# Patient Record
Sex: Male | Born: 1940 | Race: White | Hispanic: No | Marital: Married | Smoking: Never smoker
Health system: Southern US, Community
[De-identification: ages and names within clinical notes are randomized; demographics above are authoritative.]

## PROBLEM LIST (undated history)

## (undated) DIAGNOSIS — M1990 Unspecified osteoarthritis, unspecified site: Secondary | ICD-10-CM

## (undated) DIAGNOSIS — J189 Pneumonia, unspecified organism: Secondary | ICD-10-CM

## (undated) DIAGNOSIS — I499 Cardiac arrhythmia, unspecified: Secondary | ICD-10-CM

## (undated) DIAGNOSIS — K5730 Diverticulosis of large intestine without perforation or abscess without bleeding: Secondary | ICD-10-CM

## (undated) DIAGNOSIS — I10 Essential (primary) hypertension: Secondary | ICD-10-CM

## (undated) DIAGNOSIS — K648 Other hemorrhoids: Secondary | ICD-10-CM

## (undated) DIAGNOSIS — E785 Hyperlipidemia, unspecified: Secondary | ICD-10-CM

## (undated) DIAGNOSIS — I482 Chronic atrial fibrillation, unspecified: Secondary | ICD-10-CM

## (undated) DIAGNOSIS — Z86010 Personal history of colonic polyps: Secondary | ICD-10-CM

## (undated) HISTORY — DX: Diverticulosis of large intestine without perforation or abscess without bleeding: K57.30

## (undated) HISTORY — DX: Personal history of colonic polyps: Z86.010

## (undated) HISTORY — DX: Other hemorrhoids: K64.8

## (undated) HISTORY — PX: INGUINAL HERNIA REPAIR: SUR1180

## (undated) HISTORY — DX: Hyperlipidemia, unspecified: E78.5

## (undated) HISTORY — PX: COLONOSCOPY: SHX174

## (undated) HISTORY — PX: LIGAMENT REPAIR: SHX5444

## (undated) HISTORY — DX: Essential (primary) hypertension: I10

## (undated) HISTORY — PX: CATARACT EXTRACTION W/ INTRAOCULAR LENS  IMPLANT, BILATERAL: SHX1307

---
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
## 2002-01-17 DIAGNOSIS — Z860100 Personal history of colon polyps, unspecified: Secondary | ICD-10-CM

## 2002-01-17 HISTORY — DX: Personal history of colonic polyps: Z86.010

## 2002-01-17 HISTORY — DX: Personal history of colon polyps, unspecified: Z86.0100

## 2002-06-16 HISTORY — PX: KNEE ARTHROSCOPY: SHX127

## 2005-06-28 ENCOUNTER — Encounter: Admission: RE | Admit: 2005-06-28 | Discharge: 2005-06-28 | Payer: Self-pay | Admitting: Family Medicine

## 2007-02-01 ENCOUNTER — Ambulatory Visit: Payer: Self-pay | Admitting: Gastroenterology

## 2007-02-10 ENCOUNTER — Ambulatory Visit: Payer: Self-pay | Admitting: Gastroenterology

## 2011-04-24 ENCOUNTER — Encounter: Payer: Self-pay | Admitting: Gastroenterology

## 2011-05-15 ENCOUNTER — Encounter: Payer: Self-pay | Admitting: Gastroenterology

## 2011-05-15 ENCOUNTER — Ambulatory Visit (INDEPENDENT_AMBULATORY_CARE_PROVIDER_SITE_OTHER): Payer: Medicare Other | Admitting: Gastroenterology

## 2011-05-15 VITALS — BP 120/80 | HR 80 | Ht 71.0 in | Wt 202.2 lb

## 2011-05-15 DIAGNOSIS — K644 Residual hemorrhoidal skin tags: Secondary | ICD-10-CM

## 2011-05-15 DIAGNOSIS — Z86010 Personal history of colonic polyps: Secondary | ICD-10-CM

## 2011-05-15 DIAGNOSIS — K625 Hemorrhage of anus and rectum: Secondary | ICD-10-CM

## 2011-05-15 MED ORDER — HYDROCORTISONE ACE-PRAMOXINE 1-1 % RE CREA: TOPICAL_CREAM | Freq: Two times a day (BID) | RECTAL | Status: AC | PRN

## 2011-05-15 NOTE — Patient Instructions (Addendum)
Call back to schedule your pre visit and colonoscopy.  Your prescription(s) have been sent to you pharmacy.  When you have your labs done with your primary care MD please ask them to send Dr Jarold Motto a copy, fax number [phone]. Follow the High Fiber Diet below.   High Fiber Diet A high fiber diet changes your normal diet to include more whole grains, legumes, fruits, and vegetables. Changes in the diet involve replacing refined carbohydrates with unrefined foods. The calorie level of the diet is essentially unchanged. The Dietary Reference Intake (recommended amount) for adult males is 38 g per day. For adult females, it is 25 g per day. Pregnant and lactating women should consume 28 g of fiber per day. Fiber is the intact part of a plant that is not broken down during digestion. Functional fiber is fiber that has been isolated from the plant to provide a beneficial effect in the body. PURPOSE  Increase stool bulk.   Ease and regulate bowel movements.   Lower cholesterol.  INDICATIONS THAT YOU NEED MORE FIBER  Constipation and hemorrhoids.   Uncomplicated diverticulosis (intestine condition) and irritable bowel syndrome.   Weight management.   As a protective measure against hardening of the arteries (atherosclerosis), diabetes, and cancer.  NOTE OF CAUTION If you have a digestive or bowel problem, ask your caregiver for advice before adding high fiber foods to your diet. Some of the following medical problems are such that a high fiber diet should not be used without consulting your caregiver:  Acute diverticulitis (intestine infection).   Partial small bowel obstructions.   Complicated diverticular disease involving bleeding, rupture (perforation), or abscess (boil, furuncle).   Presence of autonomic neuropathy (nerve damage) or gastric paresis (stomach cannot empty itself).  GUIDELINES FOR INCREASING FIBER  Start adding fiber to the diet slowly. A gradual increase of about 5  more grams (2 slices of whole-wheat bread, 2 servings of most fruits or vegetables, or 1 bowl of high fiber cereal) per day is best. Too rapid an increase in fiber may result in constipation, flatulence, and bloating.   Drink enough water and fluids to keep your urine clear or pale yellow. Water, juice, or caffeine-free drinks are recommended. Not drinking enough fluid may cause constipation.   Eat a variety of high fiber foods rather than one type of fiber.   Try to increase your intake of fiber through using high fiber foods rather than fiber pills or supplements that contain small amounts of fiber.   The goal is to change the types of food eaten. Do not supplement your present diet with high fiber foods, but replace foods in your present diet.  INCLUDE A VARIETY OF FIBER SOURCES  Replace refined and processed grains with whole grains, canned fruits with fresh fruits, and incorporate other fiber sources. White rice, white breads, and most bakery goods contain little or no fiber.   Brown whole-grain rice, buckwheat oats, and many fruits and vegetables are all good sources of fiber. These include: broccoli, Brussels sprouts, cabbage, cauliflower, beets, sweet potatoes, white potatoes (skin on), carrots, tomatoes, eggplant, squash, berries, fresh fruits, and dried fruits.   Cereals appear to be the richest source of fiber. Cereal fiber is found in whole grains and bran. Bran is the fiber-rich outer coat of cereal grain, which is largely removed in refining. In whole-grain cereals, the bran remains. In breakfast cereals, the largest amount of fiber is found in those with "bran" in their names. The fiber content is  sometimes indicated on the label.   You may need to include additional fruits and vegetables each day.   In baking, for 1 cup white flour, you may use the following substitutions:   1 cup whole-wheat flour minus 2 tbs.    cup white flour plus  cup whole-wheat flour.  Document  Released: 06/02/2005 Document Revised: 02/12/2011 Document Reviewed: 04/10/2009 [facility] Patient Information 2012 [place], [place].

## 2011-05-15 NOTE — Progress Notes (Signed)
History of Present Illness:  This is a very healthy 70 year old Caucasian male without serious medical problems except for mild degenerative arthritis with when necessary Advil use. He does take aspirin 81 mg a day. He presents today with periodic bright red blood per rectum over the last 2 months without rectal pain, abdominal pain, diarrhea or constipation. He apparently has had normal CBC a metabolic profile at [facility]. He had a colon polyp removed in 2003 by colonoscopy with a negative followup in 2008 except for mixed hemorrhoids. Family history is noncontributory without any family members having had colon cancer or polyps. Patient's appetite is good and his weight is stable. He denies any systemic or hepatobiliary or upper GI complaints.    I have reviewed this patient's present history, medical and surgical past history, allergies and medications.     ROS: The remainder of the 10 point ROS is negative     Physical Exam: General well developed well nourished patient in no acute distress, appearing his stated age Eyes PERRLA, no icterus, fundoscopic exam per opthamologist Skin no lesions noted Neck supple, no adenopathy, no thyroid enlargement, no tenderness Chest clear to percussion and auscultation Heart no significant murmurs, gallops or rubs noted Abdomen no hepatosplenomegaly masses or tenderness, BS normal.  Rectal inspection normal no fissures, or fistulae noted.  No masses or tenderness on digital exam. Stool guaiac negative. There is a posterior bluish hemorrhoid present with stigmata of recent bleeding. Stool is guaiac negative. Extremities no acute joint lesions, edema, phlebitis or evidence of cellulitis. Neurologic patient oriented x 3, cranial nerves intact, no focal neurologic deficits noted. Psychological mental status normal and normal affect.  Assessment and plan: Probable hemorrhoidal bleeding in a 70 year old patient who is otherwise healthy.  However, he does have a past history of colon polyps, and is due for followup at this time. I prescribed when necessary Analpram cream, high fiber diet, and we will schedule colonoscopy at his convenience. I have asked him to have his primary care office  forward upcoming labs to our office for review.  No diagnosis found.

## 2011-05-16 ENCOUNTER — Encounter: Payer: Self-pay | Admitting: Gastroenterology

## 2011-05-22 ENCOUNTER — Ambulatory Visit (AMBULATORY_SURGERY_CENTER): Payer: Medicare Other | Admitting: *Deleted

## 2011-05-22 VITALS — Ht 71.0 in | Wt 202.3 lb

## 2011-05-22 DIAGNOSIS — K625 Hemorrhage of anus and rectum: Secondary | ICD-10-CM

## 2011-05-22 DIAGNOSIS — K644 Residual hemorrhoidal skin tags: Secondary | ICD-10-CM

## 2011-05-22 MED ORDER — PEG-KCL-NACL-NASULF-NA ASC-C 100 G PO SOLR: ORAL | Status: DC

## 2011-06-04 ENCOUNTER — Encounter: Payer: Self-pay | Admitting: Gastroenterology

## 2011-06-04 ENCOUNTER — Ambulatory Visit (AMBULATORY_SURGERY_CENTER): Payer: Medicare Other | Admitting: Gastroenterology

## 2011-06-04 DIAGNOSIS — K644 Residual hemorrhoidal skin tags: Secondary | ICD-10-CM

## 2011-06-04 DIAGNOSIS — K625 Hemorrhage of anus and rectum: Secondary | ICD-10-CM

## 2011-06-04 DIAGNOSIS — Z1211 Encounter for screening for malignant neoplasm of colon: Secondary | ICD-10-CM

## 2011-06-04 DIAGNOSIS — Z86010 Personal history of colonic polyps: Secondary | ICD-10-CM

## 2011-06-04 MED ORDER — SODIUM CHLORIDE 0.9 % IV SOLN: 500.0000 mL | INTRAVENOUS | Status: DC

## 2011-06-04 NOTE — Progress Notes (Signed)
Patient did not experience any of the following events: a burn prior to discharge; a fall within the facility; wrong site/side/patient/procedure/implant event; or a hospital transfer or hospital admission upon discharge from the facility. [phone])   Pt voiced no complaints noted in the recovery room. maw

## 2011-06-04 NOTE — Patient Instructions (Signed)
See the picture page for your findings from your exam today.  Follow the green and blue discharge instruction sheets the rest of the day.  Resume your prior medications today. Please call if any questions or concerns.  

## 2011-06-04 NOTE — Op Note (Signed)
[facility] [address] [place], [place]  [address]  COLONOSCOPY PROCEDURE REPORT  PATIENT:  Gregory [name], Gregory [name]  MR#:  [id number] BIRTHDATE:  10-04-1940, 70 yrs. old  GENDER:  male ENDOSCOPIST:  Vania Rea. Jarold Motto, MD, [facility] REF. BY: PROCEDURE DATE:  06/04/2011 PROCEDURE:  Diagnostic Colonoscopy ASA CLASS:  Class I INDICATIONS:  history of polyps MEDICATIONS:   propofol (Diprivan) 120 mg IV  DESCRIPTION OF PROCEDURE:   After the risks and benefits and of the procedure were explained, informed consent was obtained. Digital rectal exam was performed and revealed no abnormalities. The LB 180AL [id number] endoscope was introduced through the anus and advanced to the cecum, which was identified by both the appendix and ileocecal valve.  The quality of the prep was excellent, using MoviPrep.  The instrument was then slowly withdrawn as the colon was fully examined. <<PROCEDUREIMAGES>>  FINDINGS:  No polyps or cancers were seen.  This was otherwise a normal examination of the colon.   Retroflexed views in the rectum revealed no abnormalities.    The scope was then withdrawn from the patient and the procedure completed.  COMPLICATIONS:  None ENDOSCOPIC IMPRESSION: 1) No polyps or cancers 2) Otherwise normal examination RECOMMENDATIONS: 1) Continue current colorectal screening recommendations for "routine risk" patients with a repeat colonoscopy in 10 years.  REPEAT EXAM:  No  ______________________________ Vania Rea. Jarold Motto, MD, Clementeen Graham  CC:  Benedetto Goad, MD  n. Rosalie Doctor:   Vania Rea. Patterson at 06/04/2011 09:24 AM  Elenora Fender, [phone]

## 2011-06-05 ENCOUNTER — Telehealth: Payer: Self-pay

## 2011-06-05 NOTE — Telephone Encounter (Signed)

## 2012-03-31 ENCOUNTER — Encounter (HOSPITAL_COMMUNITY): Payer: Self-pay | Admitting: Family Medicine

## 2012-03-31 ENCOUNTER — Emergency Department (HOSPITAL_COMMUNITY): Payer: Medicare Other

## 2012-03-31 ENCOUNTER — Inpatient Hospital Stay (HOSPITAL_COMMUNITY)
Admission: EM | Admit: 2012-03-31 | Discharge: 2012-04-03 | DRG: 195 | Disposition: A | Discharge status: Home / Self Care | Payer: Medicare Other | Attending: Internal Medicine | Admitting: Internal Medicine

## 2012-03-31 DIAGNOSIS — Z8249 Family history of ischemic heart disease and other diseases of the circulatory system: Secondary | ICD-10-CM

## 2012-03-31 DIAGNOSIS — J189 Pneumonia, unspecified organism: Secondary | ICD-10-CM

## 2012-03-31 DIAGNOSIS — K625 Hemorrhage of anus and rectum: Secondary | ICD-10-CM

## 2012-03-31 DIAGNOSIS — I4891 Unspecified atrial fibrillation: Secondary | ICD-10-CM | POA: Diagnosis present

## 2012-03-31 DIAGNOSIS — N189 Chronic kidney disease, unspecified: Secondary | ICD-10-CM | POA: Diagnosis present

## 2012-03-31 DIAGNOSIS — I480 Paroxysmal atrial fibrillation: Secondary | ICD-10-CM | POA: Diagnosis present

## 2012-03-31 DIAGNOSIS — N289 Disorder of kidney and ureter, unspecified: Secondary | ICD-10-CM

## 2012-03-31 DIAGNOSIS — Z823 Family history of stroke: Secondary | ICD-10-CM

## 2012-03-31 DIAGNOSIS — I10 Essential (primary) hypertension: Secondary | ICD-10-CM | POA: Diagnosis present

## 2012-03-31 DIAGNOSIS — I129 Hypertensive chronic kidney disease with stage 1 through stage 4 chronic kidney disease, or unspecified chronic kidney disease: Secondary | ICD-10-CM | POA: Diagnosis present

## 2012-03-31 HISTORY — DX: Pneumonia, unspecified organism: J18.9

## 2012-03-31 LAB — COMPREHENSIVE METABOLIC PANEL
AST: 30 U/L (ref 0–37)
Albumin: 3.9 g/dL (ref 3.5–5.2)
Alkaline Phosphatase: 66 U/L (ref 39–117)
BUN: 22 mg/dL (ref 6–23)
GFR calc Af Amer: 46 mL/min — ABNORMAL LOW (ref >90)
GFR calc non Af Amer: 39 mL/min — ABNORMAL LOW (ref >90)
Potassium: 3.9 mEq/L (ref 3.5–5.1)
Sodium: 138 mEq/L (ref 135–145)
Total Protein: 7.6 g/dL (ref 6.0–8.3)

## 2012-03-31 LAB — CBC WITH DIFFERENTIAL/PLATELET
Basophils Relative: 0 % (ref 0–1)
HCT: 45 % (ref 39.0–52.0)
Hemoglobin: 16 g/dL (ref 13.0–17.0)
Lymphocytes Relative: 21 % (ref 12–46)
Monocytes Absolute: 0.5 10*3/uL (ref 0.1–1.0)
Monocytes Relative: 7 % (ref 3–12)
Neutrophils Relative %: 70 % (ref 43–77)
Platelets: 173 10*3/uL (ref 150–400)
WBC: 6.6 10*3/uL (ref 4.0–10.5)

## 2012-03-31 LAB — POCT I-STAT TROPONIN I

## 2012-03-31 MED ORDER — ALBUTEROL SULFATE (5 MG/ML) 0.5% IN NEBU
2.5000 mg | INHALATION_SOLUTION | Freq: Four times a day (QID) | RESPIRATORY_TRACT | Status: DC
  Administered 2012-03-31 – 2012-04-01 (×2): 2.5 mg via RESPIRATORY_TRACT
  Filled 2012-03-31 (×2): qty 0.5

## 2012-03-31 MED ORDER — METOPROLOL TARTRATE 1 MG/ML IV SOLN
5.0000 mg | INTRAVENOUS | Status: DC | PRN
  Administered 2012-03-31 – 2012-04-01 (×2): 5 mg via INTRAVENOUS
  Filled 2012-03-31 (×2): qty 5

## 2012-03-31 MED ORDER — HEPARIN SODIUM (PORCINE) 5000 UNIT/ML IJ SOLN
5000.0000 [IU] | Freq: Three times a day (TID) | INTRAMUSCULAR | Status: DC
  Administered 2012-03-31 – 2012-04-01 (×2): 5000 [IU] via SUBCUTANEOUS
  Filled 2012-03-31 (×5): qty 1

## 2012-03-31 MED ORDER — GUAIFENESIN ER 600 MG PO TB12
600.0000 mg | ORAL_TABLET | Freq: Two times a day (BID) | ORAL | Status: DC
  Administered 2012-03-31 – 2012-04-02 (×4): 600 mg via ORAL
  Filled 2012-03-31 (×5): qty 1

## 2012-03-31 MED ORDER — MENTHOL 3 MG MT LOZG
1.0000 | LOZENGE | OROMUCOSAL | Status: DC | PRN
Start: 2012-03-31 — End: 2012-04-03
  Filled 2012-03-31: qty 9

## 2012-03-31 MED ORDER — PIPERACILLIN-TAZOBACTAM 3.375 G IVPB
3.3750 g | Freq: Once | INTRAVENOUS | Status: AC
  Administered 2012-03-31: 3.375 g via INTRAVENOUS
  Filled 2012-03-31: qty 50

## 2012-03-31 MED ORDER — NAPROXEN 375 MG PO TABS
375.0000 mg | ORAL_TABLET | Freq: Three times a day (TID) | ORAL | Status: DC | PRN
  Administered 2012-03-31: 375 mg via ORAL
  Filled 2012-03-31: qty 1

## 2012-03-31 MED ORDER — SODIUM CHLORIDE 0.9 % IV BOLUS (SEPSIS)
1000.0000 mL | Freq: Once | INTRAVENOUS | Status: AC
  Administered 2012-03-31: 1000 mL via INTRAVENOUS

## 2012-03-31 MED ORDER — ENOXAPARIN SODIUM 100 MG/ML ~~LOC~~ SOLN
100.0000 mg | Freq: Two times a day (BID) | SUBCUTANEOUS | Status: DC
  Administered 2012-03-31 – 2012-04-01 (×2): 100 mg via SUBCUTANEOUS
  Filled 2012-03-31 (×3): qty 1

## 2012-03-31 MED ORDER — HYDROCOD POLST-CHLORPHEN POLST 10-8 MG/5ML PO LQCR
5.0000 mL | Freq: Once | ORAL | Status: AC
  Administered 2012-03-31: 5 mL via ORAL
  Filled 2012-03-31: qty 5

## 2012-03-31 MED ORDER — ASPIRIN 81 MG PO CHEW
81.0000 mg | CHEWABLE_TABLET | Freq: Every day | ORAL | Status: DC
  Administered 2012-03-31 – 2012-04-03 (×4): 81 mg via ORAL
  Filled 2012-03-31 (×4): qty 1

## 2012-03-31 MED ORDER — DILTIAZEM HCL 25 MG/5ML IV SOLN
10.0000 mg | Freq: Once | INTRAVENOUS | Status: AC
  Administered 2012-03-31: 10 mg via INTRAVENOUS
  Filled 2012-03-31: qty 5

## 2012-03-31 MED ORDER — HYDROCOD POLST-CHLORPHEN POLST 10-8 MG/5ML PO LQCR: 5.0000 mL | Freq: Two times a day (BID) | ORAL | Status: DC | PRN

## 2012-03-31 MED ORDER — GUAIFENESIN-CODEINE 100-10 MG/5ML PO SOLN
10.0000 mL | Freq: Four times a day (QID) | ORAL | Status: DC | PRN
  Administered 2012-04-01 – 2012-04-02 (×2): 10 mL via ORAL
  Filled 2012-03-31: qty 10
  Filled 2012-03-31: qty 5
  Filled 2012-03-31 (×2): qty 10

## 2012-03-31 MED ORDER — LEVOFLOXACIN IN D5W 750 MG/150ML IV SOLN: 750.0000 mg | INTRAVENOUS | Status: DC

## 2012-03-31 MED ORDER — VANCOMYCIN HCL IN DEXTROSE 1-5 GM/200ML-% IV SOLN
1000.0000 mg | Freq: Once | INTRAVENOUS | Status: DC
  Filled 2012-03-31: qty 200

## 2012-03-31 MED ORDER — DILTIAZEM HCL 50 MG/10ML IV SOLN
10.0000 mg | Freq: Once | INTRAVENOUS | Status: DC
  Filled 2012-03-31: qty 2

## 2012-03-31 MED ORDER — ACETAMINOPHEN 325 MG PO TABS
650.0000 mg | ORAL_TABLET | Freq: Four times a day (QID) | ORAL | Status: DC | PRN
  Administered 2012-03-31 – 2012-04-01 (×2): 650 mg via ORAL
  Filled 2012-03-31 (×2): qty 2

## 2012-03-31 MED ORDER — DILTIAZEM HCL ER COATED BEADS 120 MG PO CP24
120.0000 mg | ORAL_CAPSULE | Freq: Two times a day (BID) | ORAL | Status: DC
  Administered 2012-03-31 – 2012-04-02 (×4): 120 mg via ORAL
  Filled 2012-03-31 (×6): qty 1

## 2012-03-31 MED ORDER — LEVOFLOXACIN IN D5W 750 MG/150ML IV SOLN
750.0000 mg | INTRAVENOUS | Status: DC
  Administered 2012-03-31 – 2012-04-02 (×3): 750 mg via INTRAVENOUS
  Filled 2012-03-31 (×5): qty 150

## 2012-03-31 MED ORDER — SODIUM CHLORIDE 0.9 % IV SOLN
INTRAVENOUS | Status: DC
  Administered 2012-03-31 – 2012-04-01 (×3): via INTRAVENOUS

## 2012-03-31 NOTE — ED Notes (Signed)
Patient insists that he is supposed to see Dr. Sidonie Dickens upon arrival.  Patient did not want to be hooked up to the monitor and did not want to take off street clothes to get into gown.   Patient family at bedside.   Patient would not discuss symptoms or issues with RN.  Patient said "just call Dr. Sidonie Dickens he is waiting for me".   Patient advised that we do need to hook him up to heart monitor, and vital sign machinery.  Patient unhappy at this time.  Patient has conceded to get gown on at this time.  Patient given extra blankets per request.

## 2012-03-31 NOTE — ED Provider Notes (Signed)
History     CSN: [id number]  Arrival date & time 03/31/12  1406   First MD Initiated Contact with Patient 03/31/12 1505      Chief Complaint  Patient presents with  . Shortness of Breath  . Tachycardia    (Consider location/radiation/quality/duration/timing/severity/associated sxs/prior treatment) HPI Pt sent in from cardiologist office for pneumonia. Pt is being treated for pneumonia as outpatient with Levaquin for past few days. Worsening symptoms including cough productive of green sputum, fever, and SOB. Pt is also in new onset atrial fib. Denies palpitations, chest pain, lower ext swelling or pain.  Past Medical History  Diagnosis Date  . Personal history of colonic polyps 01/17/2002    adenomatous, tublar adenoma 02/10/2007  . Diverticulosis of colon (without mention of hemorrhage)   . Internal hemorrhoids without mention of complication   . High blood pressure     Past Surgical History  Procedure Date  . Inguinal hernia repair   . Knee arthroscopy     right  . Colonoscopy   . Cataract extraction     right    Family History  Problem Relation Age of Onset  . Heart disease Father   . Stroke Father   . Cancer Mother     bladder  . Colon cancer Neg Hx   . Stomach cancer Neg Hx     History  Substance Use Topics  . Smoking status: Never Smoker   . Smokeless tobacco: Never Used  . Alcohol Use: 8.4 oz/week    14 Glasses of wine per week      Review of Systems  Constitutional: Positive for fever, chills and fatigue.  HENT: Positive for congestion and rhinorrhea. Negative for neck pain.   Respiratory: Positive for cough and shortness of breath. Negative for wheezing.   Cardiovascular: Negative for chest pain, palpitations and leg swelling.  Gastrointestinal: Negative for nausea, vomiting, abdominal pain and diarrhea.  Musculoskeletal: Negative for back pain.  Skin: Negative for rash and wound.  Neurological: Positive for weakness. Negative for dizziness,  light-headedness, numbness and headaches.    Allergies  Review of patient's allergies indicates no known allergies.  Home Medications   Current Outpatient Rx  Name Route Sig Dispense Refill  . ASPIRIN 81 MG PO TABS Oral Take 81 mg by mouth daily.      . IBUPROFEN 200 MG PO TABS Oral Take 200 mg by mouth every 6 (six) hours as needed.      Marland Kitchen LEVOFLOXACIN 750 MG PO TABS Oral Take 750 mg by mouth daily. For 7 days    . ONE-DAILY MULTI VITAMINS PO TABS Oral Take 1 tablet by mouth daily.        BP 111/69  Pulse 88  Temp 97.8 F (36.6 C)  Resp 29  SpO2 96%  Physical Exam  Nursing note and vitals reviewed. Constitutional: He is oriented to person, place, and time. He appears well-developed and well-nourished. No distress.  HENT:  Head: Normocephalic and atraumatic.  Mouth/Throat: Oropharynx is clear and moist.  Eyes: EOM are normal. Pupils are equal, round, and reactive to light.  Neck: Normal range of motion. Neck supple.  Cardiovascular: Normal rate and regular rhythm.   Pulmonary/Chest: No respiratory distress. He has no wheezes. He has no rales.       Increased res effort. Speaking in short sentences. Decreased BS left base  Abdominal: Soft. Bowel sounds are normal. He exhibits no distension. There is no tenderness. There is no rebound and no guarding.  Musculoskeletal: Normal range of motion. He exhibits no edema and no tenderness.       No calf swelling or edema  Neurological: He is alert and oriented to person, place, and time.       Moves all ext without deficit  Skin: Skin is warm and dry. No rash noted. No erythema.  Psychiatric: He has a normal mood and affect. His behavior is normal.    ED Course  Procedures (including critical care time)  Labs Reviewed  CBC WITH DIFFERENTIAL - Abnormal; Notable for the following:    MCH 34.2 (*)     All other components within normal limits  COMPREHENSIVE METABOLIC PANEL - Abnormal; Notable for the following:    Glucose, Bld  116 (*)     Creatinine, Ser 1.69 (*)     GFR calc non Af Amer 39 (*)     GFR calc Af Amer 46 (*)     All other components within normal limits  POCT I-STAT TROPONIN I  LACTIC ACID, PLASMA  PROTIME-INR  APTT  CULTURE, BLOOD (ROUTINE X 2)  CULTURE, BLOOD (ROUTINE X 2)  URINALYSIS, ROUTINE W REFLEX MICROSCOPIC  URINE CULTURE   Dg Chest 2 View  03/31/2012  *RADIOLOGY REPORT*  Clinical Data: Shortness of breath  CHEST - 2 VIEW  Comparison: 03/31/2012  Findings: The cardiopericardial silhouette is enlarged. Interstitial markings are diffusely coarsened with chronic features.  Mild hyperexpansion raises a question of some underlying emphysema. The cardiopericardial silhouette is enlarged.  5 mm nodular opacity overlies the right lower lung.  Nodular density is also seen in the left costophrenic angle. Imaged bony structures of the thorax are intact.  IMPRESSION: Bilateral lower lobe nodular opacities. These appear new in the interval.  CT chest without contrast recommended to further evaluate.   Original Report Authenticated By: ERIC A. MANSELL, M.D.      1. Community acquired pneumonia   2. Atrial fibrillation with RVR      Date: 03/31/2012  Rate: 119  Rhythm: atrial fibrillation  QRS Axis: normal  Intervals: normal  ST/T Wave abnormalities: nonspecific T wave changes  Conduction Disutrbances:none  Narrative Interpretation:   Old EKG Reviewed: none available    MDM  HR improved with IVF and IV cardizem. Will admit to [facility]        Loren Racer, MD 03/31/12 1753

## 2012-03-31 NOTE — Consult Note (Signed)
CARDIOLOGY CONSULT NOTE  Patient ID: Gregory [name] MRN: [id number] DOB/AGE: 70-Nov-1942 70 y.o.  Admit date: 03/31/2012 Referring Physician  PCP Primary Physician:  Gregory Hoit, MD Reason for Consultation  atrial fibrillation with rapid ventricular response  HPI: Patient is a very active and fairly healthy 71 year old Caucasian male with no significant prior cardiac history, who had been doing well until about 5 days ago it started to feel feverish, not well, and today due to worsening symptoms and cough was seen by his PCP, and was started on antibiotics and was referred to me by dilation of atrial fibrillation with rapid ventricular response. After discussions with Dr. Marjory [name], I felt a 71 year old with high fever and A. fib with RVR needed to be in the hospital with diagnosis of pneumonia. The patient had acutely fallen more set by the time he came from his PCPs office to my office. Hence we recommended that he go to the emergency room. I did not see him in the office and I was in the hospital however per history from my office staff and also from his wife stating that he does not look well, I recommended that he come to the emergency room. I did see him in the emergency room and he was febrile with a temperature of 103F. Otherwise except for cough, feeling very poorly, he denies any palpitations, chest pain. He is been coughing and has not been productive. He has had a disturbed night last night. No PND or orthopnea. No history of TIA or strokes in the past. No prior history of atrial fibrillation the past.  Past Medical History  Diagnosis Date  . Personal history of colonic polyps 01/17/2002    adenomatous, tublar adenoma 02/10/2007  . Diverticulosis of colon (without mention of hemorrhage)   . Internal hemorrhoids without mention of complication   . High blood pressure      Past Surgical History  Procedure Date  . Inguinal hernia repair   . Knee arthroscopy     right  .  Colonoscopy   . Cataract extraction     right     Family History  Problem Relation Age of Onset  . Heart disease Father   . Stroke Father   . Cancer Mother     bladder  . Colon cancer Neg Hx   . Stomach cancer Neg Hx      Social History: History   Social History  . Marital Status: Married    Spouse Name: N/A    Number of Children: 2  . Years of Education: N/A   Occupational History  .  [employer]   Social History Main Topics  . Smoking status: Never Smoker   . Smokeless tobacco: Never Used  . Alcohol Use: 8.4 oz/week    14 Glasses of wine per week  . Drug Use: No  . Sexually Active: Not on file   Other Topics Concern  . Not on file   Social History Narrative  . No narrative on file     Prescriptions prior to admission  Medication Sig Dispense Refill  . aspirin 81 MG tablet Take 81 mg by mouth daily.        Marland Kitchen ibuprofen (ADVIL,MOTRIN) 200 MG tablet Take 200 mg by mouth every 6 (six) hours as needed.        Marland Kitchen levofloxacin (LEVAQUIN) 750 MG tablet Take 750 mg by mouth daily. For 7 days      . Multiple Vitamin (MULTIVITAMIN) tablet Take 1 tablet  by mouth daily.          Scheduled Meds:   . albuterol  2.5 mg Nebulization QID  . aspirin  81 mg Oral Daily  . chlorpheniramine-HYDROcodone  5 mL Oral Once  . diltiazem  120 mg Oral BID  . diltiazem  10 mg Intravenous Once  . guaiFENesin  600 mg Oral BID  . heparin  5,000 Units Subcutaneous Q8H  . levofloxacin (LEVAQUIN) IV  750 mg Intravenous Q24H  . piperacillin-tazobactam (ZOSYN)  IV  3.375 g Intravenous Once  . sodium chloride  1,000 mL Intravenous Once  . sodium chloride  1,000 mL Intravenous Once  . vancomycin  1,000 mg Intravenous Once  . DISCONTD: diltiazem  10 mg Intravenous Once  . DISCONTD: levofloxacin (LEVAQUIN) IV  750 mg Intravenous Q24H   Continuous Infusions:   . sodium chloride 100 mL/hr at 03/31/12 2053   PRN Meds:.acetaminophen, chlorpheniramine-HYDROcodone, guaiFENesin-codeine,  menthol-cetylpyridinium, metoprolol  ROS: General: Has had  fevers/chills/night sweats last 3 days. Eyes: no blurry vision, diplopia, or amaurosis ENT: no sore throat or hearing loss CV: no edema or palpitations GI: no abdominal pain, nausea, vomiting, diarrhea, or constipation GU: no dysuria, frequency, or hematuria Skin: no rash Neuro: no headache, numbness, tingling, or weakness of extremities Musculoskeletal: no joint pain or swelling Heme: no bleeding, DVT, or easy bruising Endo: no polydipsia or polyuria    Physical Exam: Blood pressure 123/83, pulse 118, temperature 103 F (39.4 C), temperature source Oral, resp. rate 20, height 5\' 11"  (1.803 m), weight 98.9 kg (218 lb 0.6 oz), SpO2 99.00%.   General appearance: alert, cooperative, appears stated age, no distress and toxic Lungs: Bilateral coarse basal crackles left worse than the right. Heart: irregularly irregular rhythm, S2: normal, no S3 or S4 and No murmur Abdomen: soft, non-tender; bowel sounds normal; no masses,  no organomegaly Extremities: extremities normal, atraumatic, no cyanosis or edema Pulses: 2+ and symmetric  Labs:   Lab Results  Component Value Date   WBC 6.6 03/31/2012   HGB 16.0 03/31/2012   HCT 45.0 03/31/2012   MCV 96.2 03/31/2012   PLT 173 03/31/2012    Lab 03/31/12 1418  NA 138  K 3.9  CL 99  CO2 24  BUN 22  CREATININE 1.69*  CALCIUM 9.4  PROT 7.6  BILITOT 0.6  ALKPHOS 66  ALT 41  AST 30  GLUCOSE 116*    Radiology: Dg Chest 2 View  03/31/2012  *RADIOLOGY REPORT*  Clinical Data: Shortness of breath  CHEST - 2 VIEW  Comparison: 03/31/2012  Findings: The cardiopericardial silhouette is enlarged. Interstitial markings are diffusely coarsened with chronic features.  Mild hyperexpansion raises a question of some underlying emphysema. The cardiopericardial silhouette is enlarged.  5 mm nodular opacity overlies the right lower lung.  Nodular density is also seen in the left costophrenic  angle. Imaged bony structures of the thorax are intact.  IMPRESSION: Bilateral lower lobe nodular opacities. These appear new in the interval.  CT chest without contrast recommended to further evaluate.   Original Report Authenticated By: Gregory [name], M.D.     EKG: Atrial fibrillation with rapid ventricular response with a heart rate of 112 beats a minute, cannot exclude inferior wall MI, old, baseline artifact, incomplete rectal branch block, nonspecific T-wave changes.  ASSESSMENT AND PLAN:  1. Bilateral pneumonia left worse in the right 2. New-onset of atrial fibrillation with rapid ventricular response Recommendation: Until we know the duration of atrial fibrillation and to see whether he  would return back to sinus rhythm, I have recommended that we start him back on Lovenox full dose until we figure out the further course of atrial fibrillation therapy including probable rectal cardio version in 4-6 weeks. I have also started him on Cardizem CD 120 mg by mouth twice a day. Also started him on codeine syrup for cough to help him sleep tonight. He still continues to have fever of 102F, I added Aleve. Continue to follow the patient along with the primary team. I do not think this is acute coronary syndrome. Cardiac markers have not been ordered.  [name], MD 03/31/2012, 9:35 PM

## 2012-03-31 NOTE — Progress Notes (Signed)
ANTICOAGULATION CONSULT NOTE - Initial Consult  Pharmacy Consult for Lovenox Indication: Atrial fibrillation  No Known Allergies  Patient Measurements: Height: 5\' 11"  (180.3 cm) Weight: 218 lb 0.6 oz (98.9 kg) IBW/kg (Calculated) : 75.3    Vital Signs: Temp: 102.6 F (39.2 C) (10/16 2130) Temp src: Oral (10/16 2130) BP: 118/66 mmHg (10/16 2130) Pulse Rate: 118  (10/16 1946)  Labs:  Basename 03/31/12 1556 03/31/12 1418  HGB -- 16.0  HCT -- 45.0  PLT -- 173  APTT 29 --  LABPROT 14.4 --  INR 1.14 --  HEPARINUNFRC -- --  CREATININE -- 1.69*  CKTOTAL -- --  CKMB -- --  TROPONINI -- --    Estimated Creatinine Clearance: 48.7 ml/min (by C-G formula based on Cr of 1.69).   Medical History: Past Medical History  Diagnosis Date  . Personal history of colonic polyps 01/17/2002    adenomatous, tublar adenoma 02/10/2007  . Diverticulosis of colon (without mention of hemorrhage)   . Internal hemorrhoids without mention of complication   . High blood pressure     Medications:  Scheduled:    . albuterol  2.5 mg Nebulization QID  . aspirin  81 mg Oral Daily  . chlorpheniramine-HYDROcodone  5 mL Oral Once  . diltiazem  120 mg Oral BID  . diltiazem  10 mg Intravenous Once  . enoxaparin (LOVENOX) injection  100 mg Subcutaneous BID  . guaiFENesin  600 mg Oral BID  . heparin  5,000 Units Subcutaneous Q8H  . levofloxacin (LEVAQUIN) IV  750 mg Intravenous Q24H  . piperacillin-tazobactam (ZOSYN)  IV  3.375 g Intravenous Once  . sodium chloride  1,000 mL Intravenous Once  . sodium chloride  1,000 mL Intravenous Once  . vancomycin  1,000 mg Intravenous Once  . DISCONTD: diltiazem  10 mg Intravenous Once  . DISCONTD: levofloxacin (LEVAQUIN) IV  750 mg Intravenous Q24H    Assessment: 71 yr old male was being seen by his PCP today for symptoms of pneumonia when an irregular heart beat occurred. The MD referred the pt to Dr. Jacinto Halim , who sent him to the ED where he was started on  antibiotics. Dr. Jacinto Halim wants him on full dose lovenox until the a.fib can be investigated further. Goal of Therapy:  Anti-Xa level 0.6-1.2 units/ml 4hrs after LMWH dose given Monitor platelets by anticoagulation protocol: Yes   Plan:  1) Lovenox 100 mg sq q12hrs (1mg /kg) 2) CBC every 3rd day to check platelet count.  Eugene Garnet 03/31/2012,10:16 PM

## 2012-03-31 NOTE — ED Notes (Signed)
Care transferred and report given to [place], [place].

## 2012-03-31 NOTE — H&P (Signed)
[facility] History and Physical  ALDOR CORBO ZOX:[id number] DOB: 05-07-1941 DOA: 03/31/2012  Referring physician: Loren Racer, MD PCP: Pamelia Hoit, MD  Specialists: Jacinto Halim  Chief Complaint: Shortness of breath  [name] is a 71 y.o. male with past medical history of diverticulosis and hypertension. Patient came to the hospital complaining about shortness of breath. Patient started started about 4 days ago with shortness of breath, cough and production of greenish sputum. Patient also had fever of 103 last night, they went to his primary care physician today and Levaquin was prescribed. Patient did not have any dose of antibiotic since his symptoms started, and because of his heart rate was irregular he was sent to Dr. Jacinto Halim. Dr. Jacinto Halim sent him to the hospital for further evaluation. Upon initial evaluation in the emergency department chest x-ray showed left lower lobe pneumonia and A. fib with RVR.   Review of Systems:  Constitutional: negative for anorexia, fevers and sweats Eyes: negative for irritation, redness and visual disturbance Ears, nose, mouth, throat, and face: negative for earaches, epistaxis, nasal congestion and sore throat Respiratory: SOB, cough and sputum production. Denies any wheezes Cardiovascular: Although he does have tachycardia but he denies palpitations, chest pain or lower extremity edema Gastrointestinal: negative for abdominal pain, constipation, diarrhea, melena, nausea and vomiting Genitourinary:negative for dysuria, frequency and hematuria Hematologic/lymphatic: negative for bleeding, easy bruising and lymphadenopathy Musculoskeletal:negative for arthralgias, muscle weakness and stiff joints Neurological: negative for coordination problems, gait problems, headaches and weakness Endocrine: negative for diabetic symptoms including polydipsia, polyuria and weight loss Allergic/Immunologic: negative for anaphylaxis, hay fever  and urticaria   Past Medical History  Diagnosis Date  . Personal history of colonic polyps 01/17/2002    adenomatous, tublar adenoma 02/10/2007  . Diverticulosis of colon (without mention of hemorrhage)   . Internal hemorrhoids without mention of complication   . High blood pressure    Past Surgical History  Procedure Date  . Inguinal hernia repair   . Knee arthroscopy     right  . Colonoscopy   . Cataract extraction     right   Social History:  reports that he has never smoked. He has never used smokeless tobacco. He reports that he drinks about 8.4 ounces of alcohol per week. He reports that he does not use illicit drugs.   No Known Allergies  Family History  Problem Relation Age of Onset  . Heart disease Father   . Stroke Father   . Cancer Mother     bladder  . Colon cancer Neg Hx   . Stomach cancer Neg Hx     Prior to Admission medications   Medication Sig Start Date End Date Taking? Authorizing Provider  aspirin 81 MG tablet Take 81 mg by mouth daily.     Yes Historical Provider, MD  ibuprofen (ADVIL,MOTRIN) 200 MG tablet Take 200 mg by mouth every 6 (six) hours as needed.     Yes Historical Provider, MD  levofloxacin (LEVAQUIN) 750 MG tablet Take 750 mg by mouth daily. For 7 days   Yes Historical Provider, MD  Multiple Vitamin (MULTIVITAMIN) tablet Take 1 tablet by mouth daily.     Yes Historical Provider, MD   Physical Exam: Filed Vitals:   03/31/12 1630 03/31/12 1645 03/31/12 1702 03/31/12 1745  BP: 123/83 128/75 113/70 111/69  Pulse: 96 61 130 88  Temp:      Resp: 32 31 33 29  SpO2: 94% 97% 97% 96%   General appearance: alert,  cooperative and no distress  Head: Normocephalic, without obvious abnormality, atraumatic  Eyes: conjunctivae/corneas clear. PERRL, EOM's intact. Fundi benign.  Nose: Nares normal. Septum midline. Mucosa normal. No drainage or sinus tenderness.  Throat: lips, mucosa, and tongue normal; teeth and gums normal  Neck: Supple, no masses,  no cervical lymphadenopathy, no JVD appreciated, no meningeal signs Resp: clear to auscultation bilaterally  Chest wall: no tenderness  Cardio: regular rate and rhythm, S1, S2 normal, no murmur, click, rub or gallop  GI: soft, non-tender; bowel sounds normal; no masses, no organomegaly  Extremities: extremities normal, atraumatic, no cyanosis or edema  Skin: Skin color, texture, turgor normal. No rashes or lesions  Neurologic: Alert and oriented X 3, normal strength and tone. Normal symmetric reflexes. Normal coordination and gait   Labs on Admission:  Basic Metabolic Panel:  Lab 03/31/12 4098  NA 138  K 3.9  CL 99  CO2 24  GLUCOSE 116*  BUN 22  CREATININE 1.69*  CALCIUM 9.4  MG --  PHOS --   Liver Function Tests:  Lab 03/31/12 1418  AST 30  ALT 41  ALKPHOS 66  BILITOT 0.6  PROT 7.6  ALBUMIN 3.9   No results found for this basename: LIPASE:5,AMYLASE:5 in the last 168 hours No results found for this basename: AMMONIA:5 in the last 168 hours CBC:  Lab 03/31/12 1418  WBC 6.6  NEUTROABS 4.7  HGB 16.0  HCT 45.0  MCV 96.2  PLT 173   Cardiac Enzymes: No results found for this basename: CKTOTAL:5,CKMB:5,CKMBINDEX:5,TROPONINI:5 in the last 168 hours  BNP (last 3 results) No results found for this basename: PROBNP:3 in the last 8760 hours CBG: No results found for this basename: GLUCAP:5 in the last 168 hours  Radiological Exams on Admission: Dg Chest 2 View  03/31/2012  *RADIOLOGY REPORT*  Clinical Data: Shortness of breath  CHEST - 2 VIEW  Comparison: 03/31/2012  Findings: The cardiopericardial silhouette is enlarged. Interstitial markings are diffusely coarsened with chronic features.  Mild hyperexpansion raises a question of some underlying emphysema. The cardiopericardial silhouette is enlarged.  5 mm nodular opacity overlies the right lower lung.  Nodular density is also seen in the left costophrenic angle. Imaged bony structures of the thorax are intact.   IMPRESSION: Bilateral lower lobe nodular opacities. These appear new in the interval.  CT chest without contrast recommended to further evaluate.   Original Report Authenticated By: ERIC A. MANSELL, M.D.     EKG: Independently reviewed.   Assessment/Plan Principal Problem:  *PNA (pneumonia) Active Problems:  Atrial fibrillation with RVR  Paroxysmal atrial fibrillation  Hypertension  Renal insufficiency   Pneumonia -LLL pneumonia, patient started on vancomycin and Zosyn in the ED. -I will hold on broad-spectrum antibiotics, treat as community-acquired pneumonia with Levaquin. -Supportive management with inhaled bronchodilators, mucolytics and oxygen as needed.  Atrial fibrillation with RVR -Patient reports that he has paroxysmal atrial fibrillation. -Protein the hospital with A. fib with RVR, he denies any symptoms. -It's probably driven by his pneumonia, I will check TSH and will rule out ACS. -This is expected to be controlled one shortness of breath/hypoxia controlled, meanwhile control with as needed IV metoprolol.  Renal insufficiency -Acute versus chronic, no baseline in our medical records. -Creatinine of 1.69, we'll hydrate with IV fluids, check BMP in a.m.  Code Status: Full  Family Communication: Wife at bedside  Disposition Plan: Telemetry  Time spent:70 minutes  [facility] A [facility] Pager [phone]  If 7PM-7AM, please contact night-coverage [email or web address] Password  [email or web address] 03/31/2012, 6:00 PM

## 2012-03-31 NOTE — ED Notes (Addendum)
Pt was at the doctor being treated for pneumonia and they noticed irregular heart beat. sts feeling bad for a few days. They gave him an abx shot. Pt shivering at triage. HR 130. Pt xray doe at [facility] and sent to Dr. Anselm Jungling

## 2012-04-01 LAB — URINALYSIS, ROUTINE W REFLEX MICROSCOPIC
Bilirubin Urine: NEGATIVE
Nitrite: NEGATIVE
Specific Gravity, Urine: 1.016 (ref 1.005–1.030)
pH: 6 (ref 5.0–8.0)

## 2012-04-01 LAB — EXPECTORATED SPUTUM ASSESSMENT W GRAM STAIN, RFLX TO RESP C

## 2012-04-01 LAB — COMPREHENSIVE METABOLIC PANEL
AST: 31 U/L (ref 0–37)
Albumin: 2.9 g/dL — ABNORMAL LOW (ref 3.5–5.2)
Alkaline Phosphatase: 47 U/L (ref 39–117)
Chloride: 106 mEq/L (ref 96–112)
Potassium: 3.9 mEq/L (ref 3.5–5.1)
Total Bilirubin: 0.5 mg/dL (ref 0.3–1.2)
Total Protein: 5.7 g/dL — ABNORMAL LOW (ref 6.0–8.3)

## 2012-04-01 LAB — CBC
HCT: 35 % — ABNORMAL LOW (ref 39.0–52.0)
MCH: 35.5 pg — ABNORMAL HIGH (ref 26.0–34.0)
MCV: 98.6 fL (ref 78.0–100.0)
Platelets: 141 10*3/uL — ABNORMAL LOW (ref 150–400)
RBC: 3.55 MIL/uL — ABNORMAL LOW (ref 4.22–5.81)
RDW: 13.5 % (ref 11.5–15.5)
WBC: 4.9 10*3/uL (ref 4.0–10.5)

## 2012-04-01 LAB — STREP PNEUMONIAE URINARY ANTIGEN: Strep Pneumo Urinary Antigen: NEGATIVE

## 2012-04-01 LAB — URINE MICROSCOPIC-ADD ON

## 2012-04-01 LAB — TSH: TSH: 0.925 u[IU]/mL (ref 0.350–4.500)

## 2012-04-01 MED ORDER — METOPROLOL TARTRATE 25 MG PO TABS
25.0000 mg | ORAL_TABLET | Freq: Three times a day (TID) | ORAL | Status: DC
  Administered 2012-04-01 – 2012-04-03 (×5): 25 mg via ORAL
  Filled 2012-04-01 (×7): qty 1

## 2012-04-01 MED ORDER — ALBUTEROL SULFATE (5 MG/ML) 0.5% IN NEBU: 2.5000 mg | INHALATION_SOLUTION | RESPIRATORY_TRACT | Status: DC | PRN

## 2012-04-01 MED ORDER — MAGNESIUM HYDROXIDE 400 MG/5ML PO SUSP
15.0000 mL | ORAL | Status: AC
  Administered 2012-04-01: 15 mL via ORAL
  Filled 2012-04-01: qty 30

## 2012-04-01 MED ORDER — ENOXAPARIN SODIUM 100 MG/ML ~~LOC~~ SOLN: 100.0000 mg | Freq: Two times a day (BID) | SUBCUTANEOUS | Status: DC

## 2012-04-01 MED ORDER — APIXABAN 5 MG PO TABS
5.0000 mg | ORAL_TABLET | Freq: Two times a day (BID) | ORAL | Status: DC
  Administered 2012-04-01 – 2012-04-03 (×4): 5 mg via ORAL
  Filled 2012-04-01 (×5): qty 1

## 2012-04-01 MED ORDER — ALBUTEROL SULFATE (5 MG/ML) 0.5% IN NEBU
2.5000 mg | INHALATION_SOLUTION | Freq: Two times a day (BID) | RESPIRATORY_TRACT | Status: DC
  Administered 2012-04-02 – 2012-04-03 (×3): 2.5 mg via RESPIRATORY_TRACT
  Filled 2012-04-01 (×3): qty 0.5

## 2012-04-01 NOTE — Care Management Note (Signed)
    Page 1 of 1   04/02/2012     12:00:08 PM   CARE MANAGEMENT NOTE 04/02/2012  Patient:  Gregory [name], Gregory [name]   Account Number:  [id number]  Date Initiated:  04/01/2012  Documentation initiated by:  Letha Cape  Subjective/Objective Assessment:   dx pna  admit- lives with spouse, pta independent.     Action/Plan:   Anticipated DC Date:  04/02/2012   Anticipated DC Plan:  HOME/SELF CARE      DC Planning Services  CM consult      Choice offered to / List presented to:             Status of service:  Completed, signed off Medicare Important Message given?   (If response is "NO", the following Medicare IM given date fields will be blank) Date Medicare IM given:   Date Additional Medicare IM given:    Discharge Disposition:  HOME/SELF CARE  Per UR Regulation:  Reviewed for med. necessity/level of care/duration of stay  If discussed at Long Length of Stay Meetings, dates discussed:    Comments:  04/02/12 11:59 Letha Cape RN, BSN [phone] patient is very independent, no needs anticipated.  04/01/12 11:06 Letha Cape RN, BSN [phone] patient lives with spouse, pta independent.  Patient has medication coverage with supplemental insurance, patient also has transportation at time of dc.  No needs anticipated.

## 2012-04-01 NOTE — Progress Notes (Signed)
[facility] PROGRESS NOTE  Gregory [name]:[id number] DOB: [birth year] DOA: 03/31/2012 PCP: Pamelia Hoit, MD  HPI/Subjective: Feels much better, denies any fever this morning but she still has cough   Assessment/Plan:  Pneumonia  -LLL pneumonia, patient started on vancomycin and Zosyn in the ED.  -Treat as community-acquired pneumonia with Levaquin.  -Supportive management with inhaled bronchodilators, mucolytics and oxygen as needed.   Atrial fibrillation with RVR  -Patient reports that he has paroxysmal atrial fibrillation.  -Protein the hospital with A. fib with RVR, he denies any symptoms.  -It's probably driven by his pneumonia, I will check TSH. -Cardiology on board, patient is on oral Cardizem, started on full dose of Lovenox. -We'll be careful with anticoagulation as patient has history of rectal bleeding before.  Renal insufficiency  -Acute versus chronic, no baseline in our medical records.  -Creatinine of 1.69, we'll hydrate with IV fluids, check BMP in a.m. improved to 1.5 today.   Code Status: Full Family Communication:  Disposition Plan: Inpatient    Consultants:  Ganji, Cardiology  Procedures:  None  Antibiotics:  Levaquin started 03/31/2012  Objective: Filed Vitals:   03/31/12 2240 04/01/12 0227 04/01/12 0409 04/01/12 0903  BP:   101/67   Pulse:   80   Temp: 100.1 F (37.8 C) 98.1 F (36.7 C) 98.3 F (36.8 C)   TempSrc: Oral Oral Oral   Resp:   20   Height:      Weight:      SpO2:   96% 96%    Intake/Output Summary (Last 24 hours) at 04/01/12 1241 Last data filed at 04/01/12 0900  Gross per 24 hour  Intake 686.67 ml  Output      0 ml  Net 686.67 ml   Filed Weights   03/31/12 1946  Weight: 98.9 kg (218 lb 0.6 oz)    Exam:  General: Alert and awake, oriented x3, not in any acute distress. HEENT: anicteric sclera, pupils reactive to light and accommodation, EOMI CVS: S1-S2 clear, no murmur rubs or  gallops Chest: ronchi esp. In LLL Abdomen: soft nontender, nondistended, normal bowel sounds, no organomegaly Extremities: no cyanosis, clubbing or edema noted bilaterally Neuro: Cranial nerves II-XII intact, no focal neurological deficits  Data Reviewed: Basic Metabolic Panel:  Lab 04/01/12 8119 03/31/12 1418  NA 137 138  K 3.9 3.9  CL 106 99  CO2 23 24  GLUCOSE 102* 116*  BUN 23 22  CREATININE 1.50* 1.69*  CALCIUM 8.3* 9.4  MG -- --  PHOS -- --   Liver Function Tests:  Lab 04/01/12 0455 03/31/12 1418  AST 31 30  ALT 34 41  ALKPHOS 47 66  BILITOT 0.5 0.6  PROT 5.7* 7.6  ALBUMIN 2.9* 3.9   No results found for this basename: LIPASE:5,AMYLASE:5 in the last 168 hours No results found for this basename: AMMONIA:5 in the last 168 hours CBC:  Lab 04/01/12 0455 03/31/12 1418  WBC 4.9 6.6  NEUTROABS -- 4.7  HGB 12.6* 16.0  HCT 35.0* 45.0  MCV 98.6 96.2  PLT 141* 173   Cardiac Enzymes: No results found for this basename: CKTOTAL:5,CKMB:5,CKMBINDEX:5,TROPONINI:5 in the last 168 hours BNP (last 3 results) No results found for this basename: PROBNP:3 in the last 8760 hours CBG: No results found for this basename: GLUCAP:5 in the last 168 hours  Recent Results (from the past 240 hour(s))  CULTURE, BLOOD (ROUTINE X 2)     Status: Normal (Preliminary result)   Collection Time  03/31/12  3:53 PM      Component Value Range Status Comment   Specimen Description BLOOD ARM RIGHT   Final    Special Requests BOTTLES DRAWN AEROBIC AND ANAEROBIC 10CC   Final    Culture  Setup Time 03/31/2012 22:42   Final    Culture     Final    Value:        BLOOD CULTURE RECEIVED NO GROWTH TO DATE CULTURE WILL BE HELD FOR 5 DAYS BEFORE ISSUING A FINAL NEGATIVE REPORT   Report Status PENDING   Incomplete   CULTURE, BLOOD (ROUTINE X 2)     Status: Normal (Preliminary result)   Collection Time   03/31/12  4:05 PM      Component Value Range Status Comment   Specimen Description BLOOD HAND  RIGHT   Final    Special Requests BOTTLES DRAWN AEROBIC ONLY 5CC   Final    Culture  Setup Time 03/31/2012 22:43   Final    Culture     Final    Value:        BLOOD CULTURE RECEIVED NO GROWTH TO DATE CULTURE WILL BE HELD FOR 5 DAYS BEFORE ISSUING A FINAL NEGATIVE REPORT   Report Status PENDING   Incomplete   CULTURE, EXPECTORATED SPUTUM-ASSESSMENT     Status: Normal   Collection Time   04/01/12  8:51 AM      Component Value Range Status Comment   Specimen Description SPUTUM   Final    Special Requests NONE   Final    Sputum evaluation     Final    Value: THIS SPECIMEN IS ACCEPTABLE. RESPIRATORY CULTURE REPORT TO FOLLOW.   Report Status 04/01/2012 FINAL   Final      Studies: Dg Chest 2 View  03/31/2012  *RADIOLOGY REPORT*  Clinical Data: Shortness of breath  CHEST - 2 VIEW  Comparison: 03/31/2012  Findings: The cardiopericardial silhouette is enlarged. Interstitial markings are diffusely coarsened with chronic features.  Mild hyperexpansion raises a question of some underlying emphysema. The cardiopericardial silhouette is enlarged.  5 mm nodular opacity overlies the right lower lung.  Nodular density is also seen in the left costophrenic angle. Imaged bony structures of the thorax are intact.  IMPRESSION: Bilateral lower lobe nodular opacities. These appear new in the interval.  CT chest without contrast recommended to further evaluate.   Original Report Authenticated By: ERIC A. MANSELL, M.D.     Scheduled Meds:   . albuterol  2.5 mg Nebulization BID  . aspirin  81 mg Oral Daily  . chlorpheniramine-HYDROcodone  5 mL Oral Once  . diltiazem  120 mg Oral BID  . diltiazem  10 mg Intravenous Once  . enoxaparin (LOVENOX) injection  100 mg Subcutaneous BID  . guaiFENesin  600 mg Oral BID  . heparin  5,000 Units Subcutaneous Q8H  . levofloxacin (LEVAQUIN) IV  750 mg Intravenous Q24H  . piperacillin-tazobactam (ZOSYN)  IV  3.375 g Intravenous Once  . sodium chloride  1,000 mL Intravenous  Once  . sodium chloride  1,000 mL Intravenous Once  . vancomycin  1,000 mg Intravenous Once  . DISCONTD: albuterol  2.5 mg Nebulization QID  . DISCONTD: diltiazem  10 mg Intravenous Once  . DISCONTD: levofloxacin (LEVAQUIN) IV  750 mg Intravenous Q24H   Continuous Infusions:   . sodium chloride 100 mL/hr at 04/01/12 1478    Principal Problem:  *PNA (pneumonia) Active Problems:  Atrial fibrillation with RVR  Paroxysmal atrial fibrillation  Hypertension  Renal insufficiency    Time spent: 35 minutes    [facility] A  [facility] Pager [phone]. If 8PM-8AM, please contact night-coverage at [email or web address], password [email or web address] 04/01/2012, 12:41 PM  LOS: 1 day

## 2012-04-01 NOTE — Progress Notes (Signed)
Subjective:  Patient feeling much better, still continues to have temperature spikes. Has started to cough more, has been nonproductive. Denies any palpitations or chest pain. Overall breathing much better.  Objective:  Vital Signs in the last 24 hours: Temp:  [98.1 F (36.7 C)-103 F (39.4 C)] 100.5 F (38.1 C) (10/17 1417) Pulse Rate:  [80-118] 98  (10/17 1417) Resp:  [20] 20  (10/17 1417) BP: (101-123)/(66-83) 120/75 mmHg (10/17 1417) SpO2:  [94 %-99 %] 94 % (10/17 1417) Weight:  [98.9 kg (218 lb 0.6 oz)] 98.9 kg (218 lb 0.6 oz) (10/16 1946)  Intake/Output from previous day: 10/16 0701 - 10/17 0700 In: 596.7 [I.V.:446.7; IV Piggyback:150] Out: -   Physical Exam:   General appearance: alert, cooperative, appears stated age and no distress Eyes: conjunctivae/corneas clear. PERRL, EOM's intact. Fundi benign. Neck: no adenopathy, no carotid bruit, no JVD, supple, symmetrical, trachea midline and thyroid not enlarged, symmetric, no tenderness/mass/nodules Neck: JVP - normal, carotids 2+= without bruits Resp: Bilateral left greater than 50%, right basal crackles and expiratory wheezes heard. Chest wall: no tenderness Cardio: irregularly irregular rhythm and no S3 or S4 GI: soft, non-tender; bowel sounds normal; no masses,  no organomegaly Extremities: extremities normal, atraumatic, no cyanosis or edema    Lab Results:  Basename 04/01/12 0455 03/31/12 1418  WBC 4.9 6.6  HGB 12.6* 16.0  PLT 141* 173    Basename 04/01/12 0455 03/31/12 1418  NA 137 138  K 3.9 3.9  CL 106 99  CO2 23 24  GLUCOSE 102* 116*  BUN 23 22  CREATININE 1.50* 1.69*     Basename 04/01/12 0455  PROT 5.7*  ALBUMIN 2.9*  AST 31  ALT 34  ALKPHOS 47  BILITOT 0.5  BILIDIR --  IBILI --     Assessment/Plan:  1. Atrial fibrillation with rapid response. Patient continues to have rapid ventricular response and heart rate slightly improved IV metoprolol. 2. Bilateral base  community-acquired  pneumonia  Recommendation: I will stop his Lovenox and switch him over to Eliquis 5 mg by mouth twice a day and also add metoprolol 25 mg by mouth 3 times a day. I will obtain an echocardiogram.   [name], M.D. 04/01/2012, 7:06 PM

## 2012-04-02 DIAGNOSIS — K625 Hemorrhage of anus and rectum: Secondary | ICD-10-CM

## 2012-04-02 LAB — LEGIONELLA ANTIGEN, URINE

## 2012-04-02 LAB — URINE CULTURE

## 2012-04-02 LAB — BASIC METABOLIC PANEL WITH GFR
BUN: 20 mg/dL (ref 6–23)
CO2: 21 meq/L (ref 19–32)
Calcium: 8.3 mg/dL — ABNORMAL LOW (ref 8.4–10.5)
Chloride: 109 meq/L (ref 96–112)
Creatinine, Ser: 1.38 mg/dL — ABNORMAL HIGH (ref 0.50–1.35)
GFR calc Af Amer: 58 mL/min — ABNORMAL LOW
GFR calc non Af Amer: 50 mL/min — ABNORMAL LOW
Glucose, Bld: 110 mg/dL — ABNORMAL HIGH (ref 70–99)
Potassium: 3.9 meq/L (ref 3.5–5.1)
Sodium: 138 meq/L (ref 135–145)

## 2012-04-02 LAB — CBC
HCT: 33.3 % — ABNORMAL LOW (ref 39.0–52.0)
Hemoglobin: 12.3 g/dL — ABNORMAL LOW (ref 13.0–17.0)
MCH: 36.8 pg — ABNORMAL HIGH (ref 26.0–34.0)
MCHC: 36.9 g/dL — ABNORMAL HIGH (ref 30.0–36.0)
MCV: 99.7 fL (ref 78.0–100.0)
Platelets: 161 K/uL (ref 150–400)
RBC: 3.34 MIL/uL — ABNORMAL LOW (ref 4.22–5.81)
RDW: 13.9 % (ref 11.5–15.5)
WBC: 4.2 K/uL (ref 4.0–10.5)

## 2012-04-02 MED ORDER — PANTOPRAZOLE SODIUM 40 MG PO TBEC
40.0000 mg | DELAYED_RELEASE_TABLET | Freq: Every day | ORAL | Status: DC
  Administered 2012-04-02 – 2012-04-03 (×2): 40 mg via ORAL
  Filled 2012-04-02 (×2): qty 1

## 2012-04-02 MED ORDER — DILTIAZEM HCL ER COATED BEADS 180 MG PO CP24
180.0000 mg | ORAL_CAPSULE | Freq: Two times a day (BID) | ORAL | Status: DC
  Administered 2012-04-02 – 2012-04-03 (×2): 180 mg via ORAL
  Filled 2012-04-02 (×3): qty 1

## 2012-04-02 MED ORDER — GUAIFENESIN ER 600 MG PO TB12
1200.0000 mg | ORAL_TABLET | Freq: Two times a day (BID) | ORAL | Status: DC
  Administered 2012-04-02 – 2012-04-03 (×2): 1200 mg via ORAL
  Filled 2012-04-02 (×4): qty 2

## 2012-04-02 MED ORDER — DIGOXIN 125 MCG PO TABS
0.1250 mg | ORAL_TABLET | Freq: Every day | ORAL | Status: DC
  Administered 2012-04-02 – 2012-04-03 (×2): 0.125 mg via ORAL
  Filled 2012-04-02 (×2): qty 1

## 2012-04-02 MED ORDER — ALUM & MAG HYDROXIDE-SIMETH 200-200-20 MG/5ML PO SUSP
15.0000 mL | Freq: Three times a day (TID) | ORAL | Status: DC | PRN
  Administered 2012-04-02: 15 mL via ORAL
  Filled 2012-04-02 (×2): qty 30

## 2012-04-02 MED ORDER — GUAIFENESIN ER 600 MG PO TB12
600.0000 mg | ORAL_TABLET | Freq: Once | ORAL | Status: AC
  Administered 2012-04-02: 600 mg via ORAL
  Filled 2012-04-02: qty 1

## 2012-04-02 NOTE — Progress Notes (Signed)
Subjective:  Patient feeling much better, still continues to have mild temperature spikes (100.3 F).  Denies any palpitations or chest pain. Overall breathing much better.  Objective:  Vital Signs in the last 24 hours: Temp:  [98.4 F (36.9 C)-99.6 F (37.6 C)] 98.4 F (36.9 C) (10/18 1438) Pulse Rate:  [80-132] 87  (10/18 2040) Resp:  [18-20] 18  (10/18 2040) BP: (105-138)/(64-80) 105/64 mmHg (10/18 1515) SpO2:  [94 %-98 %] 96 % (10/18 1438)  Intake/Output from previous day: 10/17 0701 - 10/18 0700 In: 2186.7 [P.O.:480; I.V.:1706.7] Out: -   Physical Exam:   General appearance: alert, cooperative, appears stated age and no distress Eyes: conjunctivae/corneas clear. PERRL, EOM's intact. Fundi benign. Neck: no adenopathy, no carotid bruit, no JVD, supple, symmetrical, trachea midline and thyroid not enlarged, symmetric, no tenderness/mass/nodules Neck: JVP - normal, carotids 2+= without bruits Resp: Scattered crackles left base, much improved from yesterday Chest wall: no tenderness Cardio: irregularly irregular rhythm and no S3 or S4 GI: soft, non-tender; bowel sounds normal; no masses,  no organomegaly Extremities: extremities normal, atraumatic, no cyanosis or edema    Lab Results:  Basename 04/02/12 0521 04/01/12 0455  WBC 4.2 4.9  HGB 12.3* 12.6*  PLT 161 141*    Basename 04/02/12 0521 04/01/12 0455  NA 138 137  K 3.9 3.9  CL 109 106  CO2 21 23  GLUCOSE 110* 102*  BUN 20 23  CREATININE 1.38* 1.50*     Basename 04/01/12 0455  PROT 5.7*  ALBUMIN 2.9*  AST 31  ALT 34  ALKPHOS 47  BILITOT 0.5  BILIDIR --  IBILI --   Echo 04/02/12: Hyperdynamic LVEF 80%. A. FIb with RVR. 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  Assessment/Plan:  1. Atrial fibrillation with rapid response. Patient continues to have rapid ventricular response 2. Bilateral base  community-acquired pneumonia  Recommendation: Increased Cardizem CD to 180 mg BID and  added low dose digoxin. He may be able to go home in the morning if stable and he is very anxious to go home and states he will f/u with me as OP and follow the instructions closely. If stable home tomorrow otherwise in hte next 24 to 48 hours. I am OK to have HR in 110 range due to his fever and pneumonia. Suspect A. Fibrillation is chronic given LA enlargement.   [name], M.D. 04/02/2012, 8:46 PM

## 2012-04-02 NOTE — Progress Notes (Signed)
[facility] PROGRESS NOTE  Gregory [name] ZOX:[id number] DOB: 1941-01-07 DOA: 03/31/2012 PCP: Pamelia Hoit, MD  HPI/Subjective: Feels better but he still has a low-grade fever of 99.4. Patient is eager to go home   Assessment/Plan:  Pneumonia  -Bilateral pneumonia worse on the LLL, patient started on vancomycin and Zosyn in the ED.  -Treat as community-acquired pneumonia with Levaquin.  -Supportive management with inhaled bronchodilators, mucolytics and oxygen as needed.  -Improved, but still has a low-grade fever. Leukocytosis resolved  Atrial fibrillation with RVR  -Patient reports that he has paroxysmal atrial fibrillation.  -Protein the hospital with A. fib with RVR, he denies any symptoms.  -It's probably driven by his pneumonia, TSH is normal -Cardiology on board, patient is on oral Cardizem and metoprolol. -Started on full dose of Lovenox, then switched to Eliquis.  Renal insufficiency  -Acute versus chronic, no baseline in our medical records.  -Creatinine of 1.69, we'll hydrate with IV fluids, check BMP in a.m. improved to 1.3 today.   Code Status: Full Family Communication:  Disposition Plan: Wants to go home, he can be likely discharged on the weekend to either Saturday or Sunday if it's okay with that Dr. Jacinto Halim   Consultants:  Jacinto Halim, Cardiology  Procedures:  None  Antibiotics:  Levaquin started 03/31/2012  Objective: Filed Vitals:   04/01/12 2332 04/02/12 0600 04/02/12 0827 04/02/12 1002  BP:  134/80  132/80  Pulse:  99  80  Temp: 99.6 F (37.6 C) 99.4 F (37.4 C)    TempSrc:  Oral    Resp:  20    Height:      Weight:      SpO2:  94% 98%     Intake/Output Summary (Last 24 hours) at 04/02/12 1252 Last data filed at 04/01/12 1825  Gross per 24 hour  Intake 1946.67 ml  Output      0 ml  Net 1946.67 ml   Filed Weights   03/31/12 1946  Weight: 98.9 kg (218 lb 0.6 oz)    Exam:  General: Alert and awake, oriented x3, not  in any acute distress. HEENT: anicteric sclera, pupils reactive to light and accommodation, EOMI CVS: S1-S2 clear, no murmur rubs or gallops Chest: ronchi esp. In LLL Abdomen: soft nontender, nondistended, normal bowel sounds, no organomegaly Extremities: no cyanosis, clubbing or edema noted bilaterally Neuro: Cranial nerves II-XII intact, no focal neurological deficits  Data Reviewed: Basic Metabolic Panel:  Lab 04/02/12 8119 04/01/12 0455 03/31/12 1418  NA 138 137 138  K 3.9 3.9 3.9  CL 109 106 99  CO2 21 23 24   GLUCOSE 110* 102* 116*  BUN 20 23 22   CREATININE 1.38* 1.50* 1.69*  CALCIUM 8.3* 8.3* 9.4  MG -- -- --  PHOS -- -- --   Liver Function Tests:  Lab 04/01/12 0455 03/31/12 1418  AST 31 30  ALT 34 41  ALKPHOS 47 66  BILITOT 0.5 0.6  PROT 5.7* 7.6  ALBUMIN 2.9* 3.9   No results found for this basename: LIPASE:5,AMYLASE:5 in the last 168 hours No results found for this basename: AMMONIA:5 in the last 168 hours CBC:  Lab 04/02/12 0521 04/01/12 0455 03/31/12 1418  WBC 4.2 4.9 6.6  NEUTROABS -- -- 4.7  HGB 12.3* 12.6* 16.0  HCT 33.3* 35.0* 45.0  MCV 99.7 98.6 96.2  PLT 161 141* 173   Cardiac Enzymes: No results found for this basename: CKTOTAL:5,CKMB:5,CKMBINDEX:5,TROPONINI:5 in the last 168 hours BNP (last 3 results) No results found for this  basename: PROBNP:3 in the last 8760 hours CBG: No results found for this basename: GLUCAP:5 in the last 168 hours  Recent Results (from the past 240 hour(s))  CULTURE, BLOOD (ROUTINE X 2)     Status: Normal (Preliminary result)   Collection Time   03/31/12  3:53 PM      Component Value Range Status Comment   Specimen Description BLOOD ARM RIGHT   Final    Special Requests BOTTLES DRAWN AEROBIC AND ANAEROBIC 10CC   Final    Culture  Setup Time 03/31/2012 22:42   Final    Culture     Final    Value:        BLOOD CULTURE RECEIVED NO GROWTH TO DATE CULTURE WILL BE HELD FOR 5 DAYS BEFORE ISSUING A FINAL NEGATIVE REPORT     Report Status PENDING   Incomplete   CULTURE, BLOOD (ROUTINE X 2)     Status: Normal (Preliminary result)   Collection Time   03/31/12  4:05 PM      Component Value Range Status Comment   Specimen Description BLOOD HAND RIGHT   Final    Special Requests BOTTLES DRAWN AEROBIC ONLY 5CC   Final    Culture  Setup Time 03/31/2012 22:43   Final    Culture     Final    Value:        BLOOD CULTURE RECEIVED NO GROWTH TO DATE CULTURE WILL BE HELD FOR 5 DAYS BEFORE ISSUING A FINAL NEGATIVE REPORT   Report Status PENDING   Incomplete   CULTURE, EXPECTORATED SPUTUM-ASSESSMENT     Status: Normal   Collection Time   04/01/12  8:51 AM      Component Value Range Status Comment   Specimen Description SPUTUM   Final    Special Requests NONE   Final    Sputum evaluation     Final    Value: THIS SPECIMEN IS ACCEPTABLE. RESPIRATORY CULTURE REPORT TO FOLLOW.   Report Status 04/01/2012 FINAL   Final   CULTURE, RESPIRATORY     Status: Normal (Preliminary result)   Collection Time   04/01/12  8:51 AM      Component Value Range Status Comment   Specimen Description SPUTUM   Final    Special Requests NONE   Final    Gram Stain     Final    Value: ABUNDANT WBC PRESENT, PREDOMINANTLY PMN     FEW SQUAMOUS EPITHELIAL CELLS PRESENT     RARE GRAM POSITIVE COCCI IN PAIRS   Culture Culture reincubated for better growth   Final    Report Status PENDING   Incomplete      Studies: Dg Chest 2 View  03/31/2012  *RADIOLOGY REPORT*  Clinical Data: Shortness of breath  CHEST - 2 VIEW  Comparison: 03/31/2012  Findings: The cardiopericardial silhouette is enlarged. Interstitial markings are diffusely coarsened with chronic features.  Mild hyperexpansion raises a question of some underlying emphysema. The cardiopericardial silhouette is enlarged.  5 mm nodular opacity overlies the right lower lung.  Nodular density is also seen in the left costophrenic angle. Imaged bony structures of the thorax are intact.  IMPRESSION:  Bilateral lower lobe nodular opacities. These appear new in the interval.  CT chest without contrast recommended to further evaluate.   Original Report Authenticated By: ERIC A. MANSELL, M.D.     Scheduled Meds:    . albuterol  2.5 mg Nebulization BID  . apixaban  5 mg Oral BID  .  aspirin  81 mg Oral Daily  . diltiazem  120 mg Oral BID  . guaiFENesin  600 mg Oral BID  . levofloxacin (LEVAQUIN) IV  750 mg Intravenous Q24H  . magnesium hydroxide  15 mL Oral NOW  . metoprolol tartrate  25 mg Oral TID  . vancomycin  1,000 mg Intravenous Once  . DISCONTD: enoxaparin (LOVENOX) injection  100 mg Subcutaneous BID  . DISCONTD: enoxaparin (LOVENOX) injection  100 mg Subcutaneous BID   Continuous Infusions:    . DISCONTD: sodium chloride 100 mL/hr at 04/01/12 1646    Principal Problem:  *PNA (pneumonia) Active Problems:  Atrial fibrillation with RVR  Paroxysmal atrial fibrillation  Hypertension  Renal insufficiency    Time spent: 35 minutes    [facility] A  [facility] Pager [phone]. If 8PM-8AM, please contact night-coverage at [email or web address], password [facility] 04/02/2012, 12:52 PM  LOS: 2 days

## 2012-04-02 NOTE — Progress Notes (Signed)
levaquin was not available at scheduled time, Med just recieved from pharmacy

## 2012-04-02 NOTE — Progress Notes (Signed)
Echocardiogram 2D Echocardiogram has been performed.  Gregory [name] 04/02/2012, 9:34 AM

## 2012-04-03 LAB — BASIC METABOLIC PANEL
Calcium: 8.8 mg/dL (ref 8.4–10.5)
Creatinine, Ser: 1.47 mg/dL — ABNORMAL HIGH (ref 0.50–1.35)
GFR calc Af Amer: 54 mL/min — ABNORMAL LOW (ref >90)
GFR calc non Af Amer: 47 mL/min — ABNORMAL LOW (ref >90)
Sodium: 141 mEq/L (ref 135–145)

## 2012-04-03 LAB — CULTURE, RESPIRATORY W GRAM STAIN

## 2012-04-03 MED ORDER — APIXABAN 5 MG PO TABS: 5.0000 mg | ORAL_TABLET | Freq: Two times a day (BID) | ORAL | Status: DC

## 2012-04-03 MED ORDER — METOPROLOL TARTRATE 25 MG PO TABS: 25.0000 mg | ORAL_TABLET | Freq: Two times a day (BID) | ORAL | Status: DC

## 2012-04-03 MED ORDER — LEVOFLOXACIN 750 MG PO TABS
750.0000 mg | ORAL_TABLET | Freq: Every day | ORAL | Status: DC
  Filled 2012-04-03: qty 1

## 2012-04-03 MED ORDER — DILTIAZEM HCL ER COATED BEADS 180 MG PO CP24: 180.0000 mg | ORAL_CAPSULE | Freq: Two times a day (BID) | ORAL | Status: DC

## 2012-04-03 MED ORDER — NITROGLYCERIN 0.4 MG SL SUBL
SUBLINGUAL_TABLET | SUBLINGUAL | Status: AC
  Filled 2012-04-03: qty 25

## 2012-04-03 MED ORDER — LEVOFLOXACIN 750 MG PO TABS: 750.0000 mg | ORAL_TABLET | Freq: Every day | ORAL | Status: DC

## 2012-04-03 NOTE — Discharge Summary (Signed)
Physician Discharge Summary  Gregory [name] AOZ:[id number] DOB: 11-17-1940 DOA: 03/31/2012  PCP: Pamelia Hoit, MD  Admit date: 03/31/2012 Discharge date: 04/03/2012  Recommendations for Outpatient Follow-up:  1. Followup with primary care physician Dr. Jacinto Halim.  Discharge Diagnoses:  Principal Problem:  *PNA (pneumonia) Active Problems:  Atrial fibrillation with RVR  Paroxysmal atrial fibrillation  Hypertension  Renal insufficiency   Discharge Condition: Stable  Diet recommendation: Heart healthy diet  Filed Weights   03/31/12 1946  Weight: 98.9 kg (218 lb 0.6 oz)    History of present illness:  Gregory [name] is a 71 y.o. male with past medical history of diverticulosis and hypertension. Patient came to the hospital complaining about shortness of breath. Patient started started about 4 days ago with shortness of breath, cough and production of greenish sputum. Patient also had fever of 103 last night, they went to his primary care physician today and Levaquin was prescribed. Patient did not have any dose of antibiotic since his symptoms started, and because of his heart rate was irregular he was sent to Dr. Jacinto Halim. Dr. Jacinto Halim sent him to the hospital for further evaluation. Upon initial evaluation in the emergency department chest x-ray showed left lower lobe pneumonia and A. fib with RVR.  Hospital Course:   1. Pneumonia: Community-acquired pneumonia, bilateral with worse infiltrates in the LLL. Patient was started on vancomycin and Zosyn in the emergency department, had one dose and then treated as community-acquired pneumonia with IV Levaquin. The time of discharge patient will have Levaquin for 7 more days to complete 10 days of the antibiotics because of the severity of the pneumonia. Patient also instructed to take Mucinex, patient said he will get all OTC Mucinex. His sputum cultures is pending at the time of discharge but showed GPCs in pairs, (likely  Streptococcus).  2. Atrial fibrillation with RVR: Patient reported that he had paroxysmal atrial fibrillation, he was taken low-dose aspirin for that. Upon admission to the hospital he did have atrial fibrillation with rapid ventricular response, likely triggered by the pneumonia. His TSH is normal. Dr. Jacinto Halim was consulted and he recommended Cardizem and metoprolol. Patient heart rate is more controlled, but was still some spikes in the 120s to 130s. So low-dose of digoxin was added. At the day of discharge patient heart rate was in the 80s, I did discharge him on Cardizem and metoprolol, and not on digoxin because of his renal insufficiency. He was started on Eliquis for primary stroke prevention, he is also going to be on low-dose aspirin. Patient to be evaluated by Dr. Jacinto Halim. next week for further adjustment of the medications  3. Renal sufficiency: Acute versus chronic, there is no baseline in our medical records. Presented with creatinine of 1.6, creatinine baseline is 1.47. His BUN/creatine creatinine ratio does not suggest dehydration or ARF. This is likely chronic kidney disease, patient should be followed closely.   Procedures:  2-D echocardiogram showed   - Left ventricle: The cavity size was normal. Systolic function was hyperdynamic. The estimated ejection fractionwas in the range of 75% to 85%. Wall motion was normal; there were no regional wall motion abnormalities. Thestudy is not technically sufficient to allow evaluation of LV diastolic function. - Left atrium: The atrium was severely dilated. - Pulmonary arteries: PA peak pressure: 42mm Hg (S).   Consultations:  [name], [place] cardiology  Discharge Exam: Filed Vitals:   04/02/12 2040 04/02/12 2214 04/03/12 0502 04/03/12 1001  BP:  129/85 130/78 117/70  Pulse: 87 84  86 60  Temp:  98.5 F (36.9 C) 99.1 F (37.3 C)   TempSrc:  Oral Oral   Resp: 18 20 18    Height:      Weight:      SpO2:  99% 92%    General: Alert  and awake, oriented x3, not in any acute distress. HEENT: anicteric sclera, pupils reactive to light and accommodation, EOMI CVS: S1-S2 clear, no murmur rubs or gallops Chest: There is still rhonchi bilaterally worse in the left side Abdomen: soft nontender, nondistended, normal bowel sounds, no organomegaly Extremities: no cyanosis, clubbing or edema noted bilaterally Neuro: Cranial nerves II-XII intact, no focal neurological deficits  Discharge Instructions  Discharge Orders    Future Orders Please Complete By Expires   Diet - low sodium heart healthy      Increase activity slowly          Medication List     As of 04/03/2012 10:55 AM    STOP taking these medications         ibuprofen 200 MG tablet   Commonly known as: ADVIL,MOTRIN      TAKE these medications         apixaban 5 MG Tabs tablet   Commonly known as: ELIQUIS   Take 1 tablet (5 mg total) by mouth 2 (two) times daily.      aspirin 81 MG tablet   Take 81 mg by mouth daily.      diltiazem 180 MG 24 hr capsule   Commonly known as: CARDIZEM CD   Take 1 capsule (180 mg total) by mouth 2 (two) times daily.      levofloxacin 750 MG tablet   Commonly known as: LEVAQUIN   Take 1 tablet (750 mg total) by mouth daily. For 7 days      metoprolol tartrate 25 MG tablet   Commonly known as: LOPRESSOR   Take 1 tablet (25 mg total) by mouth 2 (two) times daily.      multivitamin tablet   Take 1 tablet by mouth daily.           Follow-up Information    Follow up with [name], MD. In 1 week.   Contact information:   [address]  [place] [address] [phone]       Follow up with Pamelia Hoit, MD. In 1 week.   Contact information:   [address] [place] [address] [phone]           The results of significant diagnostics from this hospitalization (including imaging, microbiology, ancillary and laboratory) are listed below for reference.    Significant  Diagnostic Studies: Dg Chest 2 View  03/31/2012  *RADIOLOGY REPORT*  Clinical Data: Shortness of breath  CHEST - 2 VIEW  Comparison: 03/31/2012  Findings: The cardiopericardial silhouette is enlarged. Interstitial markings are diffusely coarsened with chronic features.  Mild hyperexpansion raises a question of some underlying emphysema. The cardiopericardial silhouette is enlarged.  5 mm nodular opacity overlies the right lower lung.  Nodular density is also seen in the left costophrenic angle. Imaged bony structures of the thorax are intact.  IMPRESSION: Bilateral lower lobe nodular opacities. These appear new in the interval.  CT chest without contrast recommended to further evaluate.   Original Report Authenticated By: ERIC A. MANSELL, M.D.     Microbiology: Recent Results (from the past 240 hour(s))  CULTURE, BLOOD (ROUTINE X 2)     Status: Normal (Preliminary result)  Collection Time   03/31/12  3:53 PM      Component Value Range Status Comment   Specimen Description BLOOD ARM RIGHT   Final    Special Requests BOTTLES DRAWN AEROBIC AND ANAEROBIC 10CC   Final    Culture  Setup Time 03/31/2012 22:42   Final    Culture     Final    Value:        BLOOD CULTURE RECEIVED NO GROWTH TO DATE CULTURE WILL BE HELD FOR 5 DAYS BEFORE ISSUING A FINAL NEGATIVE REPORT   Report Status PENDING   Incomplete   CULTURE, BLOOD (ROUTINE X 2)     Status: Normal (Preliminary result)   Collection Time   03/31/12  4:05 PM      Component Value Range Status Comment   Specimen Description BLOOD HAND RIGHT   Final    Special Requests BOTTLES DRAWN AEROBIC ONLY 5CC   Final    Culture  Setup Time 03/31/2012 22:43   Final    Culture     Final    Value:        BLOOD CULTURE RECEIVED NO GROWTH TO DATE CULTURE WILL BE HELD FOR 5 DAYS BEFORE ISSUING A FINAL NEGATIVE REPORT   Report Status PENDING   Incomplete   URINE CULTURE     Status: Normal   Collection Time   04/01/12  6:34 AM      Component Value Range Status  Comment   Specimen Description URINE, RANDOM   Final    Special Requests NONE   Final    Culture  Setup Time 04/01/2012 15:44   Final    Colony Count NO GROWTH   Final    Culture NO GROWTH   Final    Report Status 04/02/2012 FINAL   Final   CULTURE, EXPECTORATED SPUTUM-ASSESSMENT     Status: Normal   Collection Time   04/01/12  8:51 AM      Component Value Range Status Comment   Specimen Description SPUTUM   Final    Special Requests NONE   Final    Sputum evaluation     Final    Value: THIS SPECIMEN IS ACCEPTABLE. RESPIRATORY CULTURE REPORT TO FOLLOW.   Report Status 04/01/2012 FINAL   Final   CULTURE, RESPIRATORY     Status: Normal (Preliminary result)   Collection Time   04/01/12  8:51 AM      Component Value Range Status Comment   Specimen Description SPUTUM   Final    Special Requests NONE   Final    Gram Stain     Final    Value: ABUNDANT WBC PRESENT, PREDOMINANTLY PMN     FEW SQUAMOUS EPITHELIAL CELLS PRESENT     RARE GRAM POSITIVE COCCI IN PAIRS   Culture Culture reincubated for better growth   Final    Report Status PENDING   Incomplete      Labs: Basic Metabolic Panel:  Lab 04/03/12 1610 04/02/12 0521 04/01/12 0455 03/31/12 1418  NA 141 138 137 138  K 3.9 3.9 3.9 3.9  CL 108 109 106 99  CO2 23 21 23 24   GLUCOSE 106* 110* 102* 116*  BUN 18 20 23 22   CREATININE 1.47* 1.38* 1.50* 1.69*  CALCIUM 8.8 8.3* 8.3* 9.4  MG -- -- -- --  PHOS -- -- -- --   Liver Function Tests:  Lab 04/01/12 0455 03/31/12 1418  AST 31 30  ALT 34 41  ALKPHOS 47 66  BILITOT 0.5 0.6  PROT 5.7* 7.6  ALBUMIN 2.9* 3.9   No results found for this basename: LIPASE:5,AMYLASE:5 in the last 168 hours No results found for this basename: AMMONIA:5 in the last 168 hours CBC:  Lab 04/02/12 0521 04/01/12 0455 03/31/12 1418  WBC 4.2 4.9 6.6  NEUTROABS -- -- 4.7  HGB 12.3* 12.6* 16.0  HCT 33.3* 35.0* 45.0  MCV 99.7 98.6 96.2  PLT 161 141* 173   Cardiac Enzymes: No results found for  this basename: CKTOTAL:5,CKMB:5,CKMBINDEX:5,TROPONINI:5 in the last 168 hours BNP: BNP (last 3 results) No results found for this basename: PROBNP:3 in the last 8760 hours CBG: No results found for this basename: GLUCAP:5 in the last 168 hours  Time coordinating discharge: 40 minutes  Signed:  Koa Zoeller A  [facility] 04/03/2012, 10:55 AM

## 2012-04-03 NOTE — Progress Notes (Signed)
Patient discharge teaching given, including activity, diet, follow-up appoints, and medications. Patient verbalized understanding of all discharge instructions. IV access was d/c'd. Vitals are stable. Skin is intact except as charted in most recent assessments. Pt to be escorted out by NT, to be driven home by family. 

## 2012-04-06 LAB — CULTURE, BLOOD (ROUTINE X 2)

## 2012-05-03 ENCOUNTER — Encounter (HOSPITAL_COMMUNITY): Payer: Self-pay | Admitting: Certified Registered"

## 2012-05-03 ENCOUNTER — Encounter (HOSPITAL_COMMUNITY): Payer: Self-pay | Admitting: *Deleted

## 2012-05-03 ENCOUNTER — Encounter (HOSPITAL_COMMUNITY)
Admission: RE | Disposition: A | Discharge status: Home / Self Care | Payer: Self-pay | Source: Ambulatory Visit | Attending: Cardiology

## 2012-05-03 ENCOUNTER — Ambulatory Visit (HOSPITAL_COMMUNITY)
Admission: RE | Admit: 2012-05-03 | Discharge: 2012-05-03 | Disposition: A | Discharge status: Home / Self Care | Payer: Medicare Other | Source: Ambulatory Visit | Attending: Cardiology | Admitting: Cardiology

## 2012-05-03 ENCOUNTER — Ambulatory Visit (HOSPITAL_COMMUNITY): Payer: Medicare Other | Admitting: Certified Registered"

## 2012-05-03 DIAGNOSIS — I4891 Unspecified atrial fibrillation: Secondary | ICD-10-CM | POA: Insufficient documentation

## 2012-05-03 HISTORY — DX: Cardiac arrhythmia, unspecified: I49.9

## 2012-05-03 HISTORY — PX: CARDIOVERSION: SHX1299

## 2012-05-03 SURGERY — CARDIOVERSION
Anesthesia: Monitor Anesthesia Care

## 2012-05-03 MED ORDER — SODIUM CHLORIDE 0.9 % IV SOLN
INTRAVENOUS | Status: DC
  Administered 2012-05-03: 500 mL via INTRAVENOUS

## 2012-05-03 MED ORDER — PROPOFOL 10 MG/ML IV BOLUS
INTRAVENOUS | Status: DC | PRN
  Administered 2012-05-03: 80 mg via INTRAVENOUS

## 2012-05-03 MED ORDER — SODIUM CHLORIDE 0.9 % IV SOLN
INTRAVENOUS | Status: DC | PRN
  Administered 2012-05-03: 12:00:00 via INTRAVENOUS

## 2012-05-03 NOTE — CV Procedure (Signed)
Direct current cardioversion:  Indication symptomatic A. Fibrillation.  Procedure: Using 80 mg of IV Propofol for achieving deep (Moderate sedation), synchronized direct current cardioversion performed. Patient was delivered with 100 Joules of electricity X 1 with success to NSR. Patient tolerated the procedure well. No immediate complication noted.

## 2012-05-03 NOTE — Transfer of Care (Signed)
Immediate Anesthesia Transfer of Care Note  Patient: Gregory [name]  Procedure(s) Performed: Procedure(s) (LRB) with comments: CARDIOVERSION (N/A)  Patient Location: PACU and Endoscopy Unit  Anesthesia Type:MAC  Level of Consciousness: awake, alert  and oriented  Airway & Oxygen Therapy: Patient Spontanous Breathing and blow by O2, sats maintained >97%  Post-op Assessment: Report given to PACU RN, Post -op Vital signs reviewed and stable and Patient moving all extremities  Post vital signs: Reviewed and stable  Complications: No apparent anesthesia complications

## 2012-05-03 NOTE — Preoperative (Signed)
Beta Blockers   Reason not to administer Beta Blockers:B blocker taken this am 

## 2012-05-03 NOTE — H&P (Signed)
See the written copy of this report in the patient's paper medical record.

## 2012-05-03 NOTE — Anesthesia Postprocedure Evaluation (Signed)
  Anesthesia Post-op Note  Patient: Gregory [name]  Procedure(s) Performed: Procedure(s) (LRB) with comments: CARDIOVERSION (N/A)  Patient Location: PACU and Endoscopy Unit  Anesthesia Type:MAC  Level of Consciousness: awake, alert  and oriented  Airway and Oxygen Therapy: Patient Spontanous Breathing  Post-op Pain: none  Post-op Assessment: Post-op Vital signs reviewed, Patient's Cardiovascular Status Stable, Respiratory Function Stable, Patent Airway, No signs of Nausea or vomiting, Adequate PO intake and Pain level controlled  Post-op Vital Signs: Reviewed and stable  Complications: No apparent anesthesia complications

## 2012-05-03 NOTE — Interval H&P Note (Signed)
History and Physical Interval Note:  05/03/2012 11:54 AM  Gregory [name]  has presented today for surgery, with the diagnosis of a-fib  The various methods of treatment have been discussed with the patient and family. After consideration of risks, benefits and other options for treatment, the patient has consented to  Procedure(s) (LRB) with comments: CARDIOVERSION (N/A) as a surgical intervention .  The patient's history has been reviewed, patient examined, no change in status, stable for surgery.  I have reviewed the patient's chart and labs.  Questions were answered to the patient's satisfaction.     [name]

## 2012-05-03 NOTE — Anesthesia Preprocedure Evaluation (Addendum)
Anesthesia Evaluation  Patient identified by MRN, date of birth, ID band Patient awake    Reviewed: Allergy & Precautions, H&P , NPO status , Patient's Chart, lab work & pertinent test results, reviewed documented beta blocker date and time   Airway Mallampati: I TM Distance: >3 FB Neck ROM: full    Dental  (+) Teeth Intact and Dental Advidsory Given   Pulmonary resolved,          Cardiovascular hypertension, Pt. on medications and Pt. on home beta blockers + dysrhythmias Atrial Fibrillation     Neuro/Psych    GI/Hepatic   Endo/Other    Renal/GU Renal InsufficiencyRenal diseaseMild bun=18 cr=1.47     Musculoskeletal   Abdominal   Peds  Hematology   Anesthesia Other Findings   Reproductive/Obstetrics                         Anesthesia Physical Anesthesia Plan  ASA: II  Anesthesia Plan:    Post-op Pain Management:    Induction:   Airway Management Planned:   Additional Equipment:   Intra-op Plan:   Post-operative Plan:   Informed Consent:   Plan Discussed with:   Anesthesia Plan Comments:         Anesthesia Quick Evaluation

## 2012-05-04 ENCOUNTER — Encounter (HOSPITAL_COMMUNITY): Payer: Self-pay | Admitting: Cardiology

## 2012-06-08 ENCOUNTER — Encounter (HOSPITAL_COMMUNITY): Payer: Self-pay | Admitting: *Deleted

## 2012-06-08 ENCOUNTER — Ambulatory Visit (HOSPITAL_COMMUNITY): Payer: Medicare Other | Admitting: Anesthesiology

## 2012-06-08 ENCOUNTER — Encounter (HOSPITAL_COMMUNITY): Payer: Self-pay | Admitting: Anesthesiology

## 2012-06-08 ENCOUNTER — Encounter (HOSPITAL_COMMUNITY)
Admission: RE | Disposition: A | Discharge status: Home / Self Care | Payer: Self-pay | Source: Ambulatory Visit | Attending: Cardiology

## 2012-06-08 ENCOUNTER — Ambulatory Visit (HOSPITAL_COMMUNITY)
Admission: RE | Admit: 2012-06-08 | Discharge: 2012-06-08 | Disposition: A | Discharge status: Home / Self Care | Payer: Medicare Other | Source: Ambulatory Visit | Attending: Cardiology | Admitting: Cardiology

## 2012-06-08 DIAGNOSIS — I4891 Unspecified atrial fibrillation: Secondary | ICD-10-CM | POA: Insufficient documentation

## 2012-06-08 DIAGNOSIS — Z79899 Other long term (current) drug therapy: Secondary | ICD-10-CM | POA: Insufficient documentation

## 2012-06-08 HISTORY — PX: CARDIOVERSION: SHX1299

## 2012-06-08 SURGERY — CARDIOVERSION
Anesthesia: Monitor Anesthesia Care

## 2012-06-08 MED ORDER — PROPOFOL 10 MG/ML IV BOLUS
INTRAVENOUS | Status: DC | PRN
  Administered 2012-06-08: 80 mg via INTRAVENOUS

## 2012-06-08 MED ORDER — SODIUM CHLORIDE 0.9 % IV SOLN
250.0000 mL | INTRAVENOUS | Status: DC
  Administered 2012-06-08: 500 mL via INTRAVENOUS

## 2012-06-08 MED ORDER — SODIUM CHLORIDE 0.9 % IJ SOLN: 3.0000 mL | Freq: Two times a day (BID) | INTRAMUSCULAR | Status: DC

## 2012-06-08 MED ORDER — SODIUM CHLORIDE 0.9 % IJ SOLN: 3.0000 mL | INTRAMUSCULAR | Status: DC | PRN

## 2012-06-08 MED ORDER — SODIUM CHLORIDE 0.9 % IV SOLN
INTRAVENOUS | Status: DC | PRN
  Administered 2012-06-08: 09:00:00 via INTRAVENOUS

## 2012-06-08 NOTE — Transfer of Care (Signed)
Immediate Anesthesia Transfer of Care Note  Patient: Gregory [name]  Procedure(s) Performed: Procedure(s) (LRB) with comments: CARDIOVERSION (N/A) - Lawrence [place]se[place] Wille Celeste  Patient Location: PACU  Anesthesia Type:General  Level of Consciousness: awake, alert , oriented and sedated  Airway & Oxygen Therapy: Patient Spontanous Breathing and Patient connected to nasal cannula oxygen  Post-op Assessment: Report given to PACU RN, Post -op Vital signs reviewed and stable and Patient moving all extremities  Post vital signs: Reviewed and stable  Complications: No apparent anesthesia complications

## 2012-06-08 NOTE — Anesthesia Preprocedure Evaluation (Addendum)
Anesthesia Evaluation  Patient identified by MRN, date of birth, ID band Patient awake    Reviewed: Allergy & Precautions, H&P , NPO status , Patient's Chart, lab work & pertinent test results, reviewed documented beta blocker date and time   History of Anesthesia Complications Negative for: history of anesthetic complications  Airway Mallampati: I TM Distance: >3 FB Neck ROM: Full    Dental  (+) Chipped and Dental Advisory Given   Pulmonary neg pulmonary ROS,  breath sounds clear to auscultation  Pulmonary exam normal       Cardiovascular hypertension, Pt. on medications and Pt. on home beta blockers + Peripheral Vascular Disease + dysrhythmias (normal LVF) Atrial Fibrillation Rhythm:Irregular Rate:Normal     Neuro/Psych negative neurological ROS     GI/Hepatic negative GI ROS, Neg liver ROS,   Endo/Other  negative endocrine ROS  Renal/GU Renal InsufficiencyRenal disease (creat 1.4)     Musculoskeletal   Abdominal   Peds  Hematology   Anesthesia Other Findings   Reproductive/Obstetrics                          Anesthesia Physical Anesthesia Plan  ASA: III  Anesthesia Plan: General   Post-op Pain Management:    Induction: Intravenous  Airway Management Planned: Natural Airway and Mask  Additional Equipment:   Intra-op Plan:   Post-operative Plan:   Informed Consent: I have reviewed the patients History and Physical, chart, labs and discussed the procedure including the risks, benefits and alternatives for the proposed anesthesia with the patient or authorized representative who has indicated his/her understanding and acceptance.   Dental advisory given  Plan Discussed with: CRNA and Surgeon  Anesthesia Plan Comments: (Plan routine monitors, GA for cardioversion )        Anesthesia Quick Evaluation

## 2012-06-08 NOTE — CV Procedure (Signed)
Direct current cardioversion:  Indication symptomatic A. Fibrillation.  Procedure: Using 80 mg of IV Propofol for achieving deep (Moderate sedation), synchronized direct current cardioversion performed. Patient was delivered with 120 Joules of electricity X 1 with success to NSR. Patient tolerated the procedure well. No immediate complication noted.

## 2012-06-08 NOTE — H&P (Signed)
Gregory [name] is an 71 y.o. male.   Chief Complaint: Dyspnea HPI: Patient with new onset A. Fibrillation noted on 03/31/12 and he had pneumonia at that time. Also has chronic renal insufficiency. Underwent successful DCCV on 05/03/12, but had relapse of A. Fib and now on Flecainide. Due to fatigue and dyspnea, here for repeat cardioversion.  Past Medical History  Diagnosis Date  . Personal history of colonic polyps 01/17/2002    adenomatous, tublar adenoma 02/10/2007  . Diverticulosis of colon (without mention of hemorrhage)   . Internal hemorrhoids without mention of complication   . High blood pressure   . Dysrhythmia   . Pneumonia     Past Surgical History  Procedure Date  . Knee arthroscopy     right  . Colonoscopy   . Cataract extraction     right  . Cardioversion 05/03/2012    Procedure: CARDIOVERSION;  Surgeon: [name], MD;  Location: [facility] ENDOSCOPY;  Service: Cardiovascular;  Laterality: N/A;  . Inguinal hernia repair     2 repairs as child    Family History  Problem Relation Age of Onset  . Heart disease Father   . Stroke Father   . Cancer Mother     bladder  . Colon cancer Neg Hx   . Stomach cancer Neg Hx    Social History:  reports that he has never smoked. He has never used smokeless tobacco. He reports that he drinks about 8.4 ounces of alcohol per week. He reports that he does not use illicit drugs.  Allergies: No Known Allergies  Medications Prior to Admission  Medication Sig Dispense Refill  . apixaban (ELIQUIS) 5 MG TABS tablet Take 1 tablet (5 mg total) by mouth 2 (two) times daily.  60 tablet  0  . diltiazem (CARDIZEM CD) 180 MG 24 hr capsule Take 1 capsule (180 mg total) by mouth 2 (two) times daily.  60 capsule  0  . flecainide (TAMBOCOR) 100 MG tablet Take 100 mg by mouth 2 (two) times daily.      Marland Kitchen aspirin 81 MG tablet Take 81 mg by mouth daily.        Marland Kitchen levofloxacin (LEVAQUIN) 750 MG tablet Take 1 tablet (750 mg total) by mouth daily.  For 7 days  7 tablet  0  . metoprolol tartrate (LOPRESSOR) 25 MG tablet Take 1 tablet (25 mg total) by mouth 2 (two) times daily.  60 tablet  0  . Multiple Vitamin (MULTIVITAMIN) tablet Take 1 tablet by mouth daily.            Review of Systems - Respiratory ROS: negative for - cough, hemoptysis, orthopnea or wheezing Cardiovascular ROS: positive for - dyspnea on exertion negative for - chest pain, loss of consciousness, orthopnea or paroxysmal nocturnal dyspnea Gastrointestinal ROS: no abdominal pain, change in bowel habits, or black or bloody stools Neurological ROS: no TIA or stroke symptoms  Blood pressure 134/84, pulse 70, temperature 97.6 F (36.4 C), temperature source Oral, resp. rate 22, height 5\' 11"  (1.803 m), weight 89.812 kg (198 lb), SpO2 95.00%. General appearance: alert, cooperative and appears stated age Eyes: conjunctivae/corneas clear. PERRL, EOM's intact. Fundi benign. Neck: no adenopathy, no carotid bruit, no JVD, supple, symmetrical, trachea midline and thyroid not enlarged, symmetric, no tenderness/mass/nodules Neck: JVP - normal, carotids 2+= without bruits Resp: clear to auscultation bilaterally Chest wall: no tenderness Cardio: S1 variable, S2 normal. II/VI SEM in the apex. No gallop or rub. GI: soft, non-tender; bowel sounds  normal; no masses,  no organomegaly Extremities: extremities normal, atraumatic, no cyanosis or edema Pulses: 2+ and symmetric Skin: Skin color, texture, turgor normal. No rashes or lesions Neurologic: Grossly normal  No results found for this or any previous visit (from the past 48 hour(s)). No results found.  Labs:   Lab Results  Component Value Date   WBC 4.2 04/02/2012   HGB 12.3* 04/02/2012   HCT 33.3* 04/02/2012   MCV 99.7 04/02/2012   PLT 161 04/02/2012   No results found for this basename: NA,K,CL,CO2,BUN,CREATININE,CALCIUM,LABALBU,PROT,BILITOT,ALKPHOS,ALT,AST,GLUCOSE in the last 168 hours No results found for this  basename: CKTOTAL, CKMB, CKMBINDEX, TROPONINI    Lipid Panel  No results found for this basename: chol, trig, hdl, cholhdl, vldl, ldlcalc    EKG: 06/08/2012: A. Fib with controlled V-resp[onse. NO ischemia. Normal QT.Marland Kitchen   Assessment/Plan 1. A. Fibrillation scheduled for elective direct current cardioversion. 2. Chronic renal insufficiency.  [name], MD 06/08/2012, 7:21 AM [facility] Pager: [phone] Office: [phone] If no answer: Cell:  [phone]

## 2012-06-08 NOTE — Anesthesia Postprocedure Evaluation (Signed)
  Anesthesia Post-op Note  Patient: Gregory [name]  Procedure(s) Performed: Procedure(s) (LRB) with comments: CARDIOVERSION (N/A) - Lawrence Marseilles Wille Celeste  Patient Location: PACU  Anesthesia Type:General  Level of Consciousness: awake, alert , oriented and sedated  Airway and Oxygen Therapy: Patient Spontanous Breathing and Patient connected to nasal cannula oxygen  Post-op Pain: none  Post-op Assessment: Post-op Vital signs reviewed and Patient's Cardiovascular Status Stable  Post-op Vital Signs: Reviewed and stable  Complications: No apparent anesthesia complications

## 2012-06-08 NOTE — Preoperative (Signed)
Beta Blockers   Reason not to administer Beta Blockers:Not Applicable 

## 2012-06-08 NOTE — Interval H&P Note (Signed)
History and Physical Interval Note:  06/08/2012 7:29 AM  Gregory [name]  has presented today for surgery, with the diagnosis of atrial fib  The various methods of treatment have been discussed with the patient and family. After consideration of risks, benefits and other options for treatment, the patient has consented to  Procedure(s) (LRB) with comments: CARDIOVERSION (N/A) - [name] as a surgical intervention .  The patient's history has been reviewed, patient examined, no change in status, stable for surgery.  I have reviewed the patient's chart and labs.  Questions were answered to the patient's satisfaction.     [name]

## 2012-06-10 ENCOUNTER — Encounter (HOSPITAL_COMMUNITY): Payer: Self-pay | Admitting: Cardiology

## 2012-07-31 ENCOUNTER — Other Ambulatory Visit: Payer: Self-pay

## 2013-01-19 ENCOUNTER — Other Ambulatory Visit: Payer: Self-pay

## 2013-04-21 ENCOUNTER — Other Ambulatory Visit: Payer: Self-pay

## 2013-06-29 DIAGNOSIS — H26499 Other secondary cataract, unspecified eye: Secondary | ICD-10-CM | POA: Diagnosis not present

## 2013-07-08 DIAGNOSIS — N182 Chronic kidney disease, stage 2 (mild): Secondary | ICD-10-CM | POA: Diagnosis not present

## 2013-07-08 DIAGNOSIS — Z8679 Personal history of other diseases of the circulatory system: Secondary | ICD-10-CM | POA: Diagnosis not present

## 2013-07-08 DIAGNOSIS — Z9889 Other specified postprocedural states: Secondary | ICD-10-CM | POA: Diagnosis not present

## 2013-07-28 DIAGNOSIS — I4891 Unspecified atrial fibrillation: Secondary | ICD-10-CM | POA: Diagnosis not present

## 2013-07-28 DIAGNOSIS — I270 Primary pulmonary hypertension: Secondary | ICD-10-CM | POA: Diagnosis not present

## 2013-09-06 DIAGNOSIS — J069 Acute upper respiratory infection, unspecified: Secondary | ICD-10-CM | POA: Diagnosis not present

## 2013-09-06 DIAGNOSIS — R059 Cough, unspecified: Secondary | ICD-10-CM | POA: Diagnosis not present

## 2013-09-06 DIAGNOSIS — R05 Cough: Secondary | ICD-10-CM | POA: Diagnosis not present

## 2013-09-06 DIAGNOSIS — J209 Acute bronchitis, unspecified: Secondary | ICD-10-CM | POA: Diagnosis not present

## 2013-09-22 DIAGNOSIS — J209 Acute bronchitis, unspecified: Secondary | ICD-10-CM | POA: Diagnosis not present

## 2013-10-28 ENCOUNTER — Ambulatory Visit (HOSPITAL_COMMUNITY): Payer: Medicare Other | Admitting: Critical Care Medicine

## 2013-10-28 ENCOUNTER — Encounter (HOSPITAL_COMMUNITY): Payer: Self-pay | Admitting: *Deleted

## 2013-10-28 ENCOUNTER — Encounter (HOSPITAL_COMMUNITY)
Admission: AD | Disposition: A | Discharge status: Home / Self Care | Payer: Self-pay | Source: Ambulatory Visit | Attending: Cardiology

## 2013-10-28 ENCOUNTER — Ambulatory Visit (HOSPITAL_COMMUNITY)
Admission: AD | Admit: 2013-10-28 | Discharge: 2013-10-28 | Disposition: A | Discharge status: Home / Self Care | Payer: Medicare Other | Source: Ambulatory Visit | Attending: Cardiology | Admitting: Cardiology

## 2013-10-28 ENCOUNTER — Encounter (HOSPITAL_COMMUNITY): Payer: Medicare Other | Admitting: Critical Care Medicine

## 2013-10-28 DIAGNOSIS — Z9889 Other specified postprocedural states: Secondary | ICD-10-CM | POA: Diagnosis not present

## 2013-10-28 DIAGNOSIS — I4891 Unspecified atrial fibrillation: Secondary | ICD-10-CM | POA: Diagnosis not present

## 2013-10-28 DIAGNOSIS — N182 Chronic kidney disease, stage 2 (mild): Secondary | ICD-10-CM | POA: Diagnosis not present

## 2013-10-28 HISTORY — PX: CARDIOVERSION: SHX1299

## 2013-10-28 LAB — POCT I-STAT 4, (NA,K, GLUC, HGB,HCT)
GLUCOSE: 105 mg/dL — AB (ref 70–99)
HEMATOCRIT: 46 % (ref 39.0–52.0)
Hemoglobin: 15.6 g/dL (ref 13.0–17.0)
Potassium: 4.1 mEq/L (ref 3.7–5.3)
SODIUM: 143 meq/L (ref 137–147)

## 2013-10-28 SURGERY — CARDIOVERSION
Anesthesia: General

## 2013-10-28 MED ORDER — SODIUM CHLORIDE 0.9 % IV SOLN: INTRAVENOUS | Status: AC

## 2013-10-28 MED ORDER — PROPOFOL 10 MG/ML IV BOLUS
INTRAVENOUS | Status: DC | PRN
  Administered 2013-10-28: 100 mg via INTRAVENOUS

## 2013-10-28 MED ORDER — LIDOCAINE HCL (CARDIAC) 20 MG/ML IV SOLN
INTRAVENOUS | Status: DC | PRN
  Administered 2013-10-28: 40 mg via INTRAVENOUS

## 2013-10-28 NOTE — Anesthesia Preprocedure Evaluation (Signed)
Anesthesia Evaluation  Patient identified by MRN, date of birth, ID band Patient awake    Airway       Dental  (+) Dental Advisory Given   Pulmonary          Cardiovascular hypertension, Pt. on home beta blockers and Pt. on medications + dysrhythmias Atrial Fibrillation     Neuro/Psych    GI/Hepatic   Endo/Other    Renal/GU      Musculoskeletal   Abdominal   Peds  Hematology   Anesthesia Other Findings   Reproductive/Obstetrics                           Anesthesia Physical Anesthesia Plan  ASA: II  Anesthesia Plan: General   Post-op Pain Management:    Induction: Intravenous  Airway Management Planned: Mask  Additional Equipment:   Intra-op Plan:   Post-operative Plan:   Informed Consent: I have reviewed the patients History and Physical, chart, labs and discussed the procedure including the risks, benefits and alternatives for the proposed anesthesia with the patient or authorized representative who has indicated his/her understanding and acceptance.   Dental advisory given  Plan Discussed with: Anesthesiologist and Surgeon  Anesthesia Plan Comments:         Anesthesia Quick Evaluation

## 2013-10-28 NOTE — Discharge Instructions (Signed)
Monitored Anesthesia Care  °Monitored anesthesia care is an anesthesia service for a medical procedure. Anesthesia is the loss of the ability to feel pain. It is produced by medications called anesthetics. It may affect a small area of your body (local anesthesia), a large area of your body (regional anesthesia), or your entire body (general anesthesia). The need for monitored anesthesia care depends your procedure, your condition, and the potential need for regional or general anesthesia. It is often provided during procedures where:  °· General anesthesia may be needed if there are complications. This is because you need special care when you are under general anesthesia.   °· You will be under local or regional anesthesia. This is so that you are able to have higher levels of anesthesia if needed.   °· You will receive calming medications (sedatives). This is especially the case if sedatives are given to put you in a semi-conscious state of relaxation (deep sedation). This is because the amount of sedative needed to produce this state can be hard to predict. Too much of a sedative can produce general anesthesia. °Monitored anesthesia care is performed by one or more caregivers who have special training in all types of anesthesia. You will need to meet with these caregivers before your procedure. During this meeting, they will ask you about your medical history. They will also give you instructions to follow. (For example, you will need to stop eating and drinking before your procedure. You may also need to stop or change medications you are taking.) During your procedure, your caregivers will stay with you. They will:  °· Watch your condition. This includes watching you blood pressure, breathing, and level of pain.   °· Diagnose and treat problems that occur.   °· Give medications if they are needed. These may include calming medications (sedatives) and anesthetics.   °· Make sure you are comfortable.   °Having  monitored anesthesia care does not necessarily mean that you will be under anesthesia. It does mean that your caregivers will be able to manage anesthesia if you need it or if it occurs. It also means that you will be able to have a different type of anesthesia than you are having if you need it. When your procedure is complete, your caregivers will continue to watch your condition. They will make sure any medications wear off before you are allowed to go home.  °Document Released: 02/26/2005 Document Revised: 09/27/2012 Document Reviewed: 07/14/2012 °ExitCare® Patient Information ©2014 [employer]. °Electrical Cardioversion °Electrical cardioversion is the delivery of a jolt of electricity to change the rhythm of the heart. Sticky patches or metal paddles are placed on the chest to deliver the electricity from a device. This is done to restore a normal rhythm. A rhythm that is too fast or not regular keeps the heart from pumping well. °Electrical cardioversion is done in an emergency if:  °· There is low or no blood pressure as a result of the heart rhythm.   °· Normal rhythm must be restored as fast as possible to protect the brain and heart from further damage.   °· It may save a life. °Cardioversion may be done for heart rhythms that are not immediately life-threatening, such as atrial fibrillation or flutter, in which:  °· The heart is beating too fast or is not regular.   °· Medicine to change the rhythm has not worked.   °· It is safe to wait in order to allow time for preparation. °· Symptoms of the abnormal rhythm are bothersome. °· The risk of   stroke and other serious complications can be reduced. °LET YOUR CAREGIVER KNOW ABOUT:  °· All medicines you are taking, including vitamins, herbs, eye drops, creams, and over-the-counter medicines.   °· Previous problems you or members of your family have had with the use of anesthetics.   °· Any blood disorders you have.   °· Previous surgeries you have had.    °· Medical conditions you have. °RISKS AND COMPLICATIONS  °Generally, this is a safe procedure. However, as with any procedure, complications can occur. Possible complications include:  °· Breathing problems related to the anesthetic used. °· Cardiac arrest This risk is rare. °· A blood clot that breaks free and travels to other parts of your body. This could cause a stroke or other problems. The risk of this is lowered by use of blood thinning medicine (anticoagulant) prior to the procedure. °BEFORE THE PROCEDURE  °· You may have tests to detect blood clots in your heart and evaluate heart function.  °· You may start taking anticoagulants so your blood does not clot as easily.   °· Medicines may be given to help stabilize your heart rate and rhythm. °PROCEDURE °· You will be given medicine through an IV tube to reduce discomfort and make you sleepy (sedative).   °· An electrical shock will be delivered. °AFTER THE PROCEDURE °Your heart rhythm will be watched to make sure it does not change. You may be able to go home within a few hours.  °Document Released: 05/23/2002 Document Revised: 03/23/2013 Document Reviewed: 12/15/2012 °ExitCare® Patient Information ©2014 [employer]. ° °

## 2013-10-28 NOTE — Anesthesia Procedure Notes (Signed)
Date/Time: 10/28/2013 2:15 PM Performed by: Elon AlasLEE, Korey Arroyo BROWN Pre-anesthesia Checklist: Patient identified, Emergency Drugs available, Suction available, Patient being monitored and Timeout performed Patient Re-evaluated:Patient Re-evaluated prior to inductionOxygen Delivery Method: Ambu bag Preoxygenation: Pre-oxygenation with 100% oxygen Intubation Type: IV induction Placement Confirmation: breath sounds checked- equal and bilateral Dental Injury: Teeth and Oropharynx as per pre-operative assessment

## 2013-10-28 NOTE — Interval H&P Note (Signed)
History and Physical Interval Note:  10/28/2013 2:12 PM  [name]  has presented today for surgery, with the diagnosis of afib  The various methods of treatment have been discussed with the patient and family. After consideration of risks, benefits and other options for treatment, the patient has consented to  Procedure(s): CARDIOVERSION (N/A) as a surgical intervention .  The patient's history has been reviewed, patient examined, no change in status, stable for surgery.  I have reviewed the patient's chart and labs.  Questions were answered to the patient's satisfaction.     Pamella PertJagadeesh R Zyanya Glaza

## 2013-10-28 NOTE — Transfer of Care (Signed)
Immediate Anesthesia Transfer of Care Note  Patient: Gregory ArbourDouglas E [name]  Procedure(s) Performed: Procedure(s): CARDIOVERSION (N/A)  Patient Location: Endoscopy Unit  Anesthesia Type:General  Level of Consciousness: awake, alert  and oriented  Airway & Oxygen Therapy: Patient Spontanous Breathing  Post-op Assessment: Report given to PACU RN, Post -op Vital signs reviewed and stable and Patient moving all extremities X 4  Post vital signs: Reviewed and stable  Complications: No apparent anesthesia complications

## 2013-10-28 NOTE — Anesthesia Postprocedure Evaluation (Signed)
  Anesthesia Post-op Note  Patient: Gregory [name]  Procedure(s) Performed: Procedure(s): CARDIOVERSION (N/A)  Patient Location: Endoscopy Unit  Anesthesia Type:General  Level of Consciousness: awake, alert, and oriented  Airway and Oxygen Therapy: Patient Spontanous Breathing  Post-op Pain: none  Post-op Assessment: Post-op Vital signs reviewed, Patient's Cardiovascular Status Stable, Respiratory Function Stable, Patent Airway and No signs of Nausea or vomiting  Post-op Vital Signs: Reviewed and stable  Last Vitals:  Filed Vitals:   10/28/13 1419  BP: 117/77  Resp: 19    Complications: No apparent anesthesia complications

## 2013-10-28 NOTE — H&P (Signed)
  Please see office visit notes for complete details of HPI.  

## 2013-10-28 NOTE — CV Procedure (Signed)
Direct current cardioversion:  Indication symptomatic A. Fibrillation.  Procedure: Using 100 mg of IV Propofol and 40 IV Lidocaine (for reducing venous pain) for achieving deep sedation, synchronized direct current cardioversion performed. Patient was delivered with 100 Joules of electricity X 1 with success to NSR. Patient tolerated the procedure well. No immediate complication noted.

## 2013-10-31 ENCOUNTER — Encounter (HOSPITAL_COMMUNITY): Payer: Self-pay | Admitting: Cardiology

## 2013-11-03 DIAGNOSIS — K922 Gastrointestinal hemorrhage, unspecified: Secondary | ICD-10-CM | POA: Diagnosis not present

## 2013-11-29 ENCOUNTER — Encounter: Payer: Self-pay | Admitting: Gastroenterology

## 2013-11-29 ENCOUNTER — Ambulatory Visit (INDEPENDENT_AMBULATORY_CARE_PROVIDER_SITE_OTHER): Payer: Medicare Other | Admitting: Gastroenterology

## 2013-11-29 VITALS — BP 140/70 | HR 68 | Ht 71.0 in | Wt 202.0 lb

## 2013-11-29 DIAGNOSIS — K649 Unspecified hemorrhoids: Secondary | ICD-10-CM | POA: Diagnosis not present

## 2013-11-29 NOTE — Patient Instructions (Signed)
Should change to taking the miralax 1/2 dose every day to help prevent constipation, pushing and straining.

## 2013-11-29 NOTE — Progress Notes (Signed)
Review of pertinent gastrointestinal problems: 1. Adenomatous polyps: [facility] colonoscopy 2003 2 small adenomas, Colonsocopy patterson 2008 no polyps; colonoscopy [facility] 05/2011 no polyps: recommended recall was 10 years.  HPI: This is a    very pleasant 73 year old man whom I am meeting for the first time today.   He brought a list of dates.  Red blood on stool for 3-4 weeks now.  He had been having trouble with constipation. Was having large, really had push and strain to move his bowels.    Went to his PCP, recommended miralax.  Taking it every few days.  Saw blood on underpants.  Not every BM.  No blood in about 2 weeks.  No anal pain.  No blood thinners except asa daily.   Review of systems: Pertinent positive and negative review of systems were noted in the above HPI section. Complete review of systems was performed and was otherwise normal.    Past Medical History  Diagnosis Date  . Personal history of colonic polyps 01/17/2002    adenomatous, tublar adenoma 02/10/2007  . Diverticulosis of colon (without mention of hemorrhage)   . Internal hemorrhoids without mention of complication   . High blood pressure   . Dysrhythmia   . Pneumonia     Past Surgical History  Procedure Laterality Date  . Knee arthroscopy      right  . Colonoscopy    . Cataract extraction      right  . Cardioversion  05/03/2012    Procedure: CARDIOVERSION;  Surgeon: Pamella PertJagadeesh R Ganji, MD;  [facility];  Service: Cardiovascular;  Laterality: N/A;  . Inguinal hernia repair      2 repairs as child  . Cardioversion  06/08/2012    Procedure: CARDIOVERSION;  Surgeon: Pamella PertJagadeesh R Ganji, MD;  [facility];  Service: Cardiovascular;  Laterality: N/A;  Lawrence Marseillestrista Wille Celeste/janie  . Cardioversion N/A 10/28/2013    Procedure: CARDIOVERSION;  Surgeon: Pamella PertJagadeesh R Ganji, MD;  [facility];  Service: Cardiovascular;  Laterality: N/A;    Current Outpatient Prescriptions   Medication Sig Dispense Refill  . aspirin 325 MG tablet Take 325 mg by mouth daily.      Marland Kitchen. diltiazem (CARDIZEM CD) 120 MG 24 hr capsule Take 120 mg by mouth daily.      . flecainide (TAMBOCOR) 100 MG tablet Take 100 mg by mouth 2 (two) times daily.      . Multiple Vitamin (MULTIVITAMIN) tablet Take 1 tablet by mouth daily.         No current facility-administered medications for this visit.    Allergies as of 11/29/2013  . (No Known Allergies)    Family History  Problem Relation Age of Onset  . Heart disease Father   . Stroke Father   . Cancer Mother     bladder  . Colon cancer Neg Hx   . Stomach cancer Neg Hx     History   Social History  . Marital Status: Married    Spouse Name: N/A    Number of Children: 2  . Years of Education: N/A   Occupational History  .  [employer]   Social History Main Topics  . Smoking status: Never Smoker   . Smokeless tobacco: Never Used  . Alcohol Use: 8.4 oz/week    14 Glasses of wine per week     Comment: glass of wine every day  . Drug Use: No  . Sexual Activity: Not on file   Other Topics Concern  .  Not on file   Social History Narrative  . No narrative on file       Physical Exam: BP 140/70  Pulse 68  Ht 5\' 11"  (1.803 m)  Wt 202 lb (91.627 kg)  BMI 28.19 kg/m2 Constitutional: generally well-appearing Psychiatric: alert and oriented x3 Eyes: extraocular movements intact Mouth: oral pharynx moist, no lesions Neck: supple no lymphadenopathy Cardiovascular: heart regular rate and rhythm Lungs: clear to auscultation bilaterally Abdomen: soft, nontender, nondistended, no obvious ascites, no peritoneal signs, normal bowel sounds Extremities: no lower extremity edema bilaterally Skin: no lesions on visible extremities Rectal examination: Small, nonthrombosed, posterior external anal hemorrhoid, no anal fissures, no internal masses, stool was brown and Hemoccult negative   Assessment and plan: 73 y.o. male with  minor  rectal bleeding from hemorrhoid  He has already noticed that MiraLax helps him from having to push and strain. I told him that is probably best be taken on a daily basis to try to prevent the pushing and straining and further. One half dose every day. He will call if he has any further questions or concerns

## 2014-01-11 DIAGNOSIS — Z9889 Other specified postprocedural states: Secondary | ICD-10-CM | POA: Diagnosis not present

## 2014-01-11 DIAGNOSIS — N182 Chronic kidney disease, stage 2 (mild): Secondary | ICD-10-CM | POA: Diagnosis not present

## 2014-01-11 DIAGNOSIS — I4891 Unspecified atrial fibrillation: Secondary | ICD-10-CM | POA: Diagnosis not present

## 2014-01-20 DIAGNOSIS — H2510 Age-related nuclear cataract, unspecified eye: Secondary | ICD-10-CM | POA: Diagnosis not present

## 2014-06-28 DIAGNOSIS — I480 Paroxysmal atrial fibrillation: Secondary | ICD-10-CM | POA: Diagnosis not present

## 2014-06-28 DIAGNOSIS — Z9889 Other specified postprocedural states: Secondary | ICD-10-CM | POA: Diagnosis not present

## 2014-06-29 DIAGNOSIS — I480 Paroxysmal atrial fibrillation: Secondary | ICD-10-CM | POA: Diagnosis not present

## 2014-06-29 NOTE — Anesthesia Preprocedure Evaluation (Addendum)
Anesthesia Evaluation  Patient identified by MRN, date of birth, ID band Patient awake    Reviewed: Allergy & Precautions, NPO status , Patient's Chart, lab work & pertinent test results, reviewed documented beta blocker date and time   Airway Mallampati: I  TM Distance: >3 FB Neck ROM: Full    Dental  (+) Teeth Intact, Dental Advisory Given   Pulmonary neg pulmonary ROS,          Cardiovascular hypertension, + dysrhythmias Atrial Fibrillation  EF 2013 65%   Neuro/Psych negative neurological ROS     GI/Hepatic negative GI ROS, Neg liver ROS,   Endo/Other    Renal/GU GFR 50     Musculoskeletal   Abdominal   Peds  Hematology   Anesthesia Other Findings   Reproductive/Obstetrics                           Anesthesia Physical Anesthesia Plan  ASA: III  Anesthesia Plan: MAC   Post-op Pain Management:    Induction: Intravenous  Airway Management Planned: Nasal Cannula  Additional Equipment:   Intra-op Plan:   Post-operative Plan:   Informed Consent: I have reviewed the patients History and Physical, chart, labs and discussed the procedure including the risks, benefits and alternatives for the proposed anesthesia with the patient or authorized representative who has indicated his/her understanding and acceptance.     Plan Discussed with:   Anesthesia Plan Comments:         Anesthesia Quick Evaluation

## 2014-06-30 ENCOUNTER — Encounter (HOSPITAL_COMMUNITY): Payer: Self-pay | Admitting: Critical Care Medicine

## 2014-06-30 ENCOUNTER — Ambulatory Visit (HOSPITAL_COMMUNITY)
Admission: RE | Admit: 2014-06-30 | Discharge: 2014-06-30 | Disposition: A | Discharge status: Home / Self Care | Payer: Medicare Other | Source: Ambulatory Visit | Attending: Cardiology | Admitting: Cardiology

## 2014-06-30 ENCOUNTER — Encounter (HOSPITAL_COMMUNITY)
Admission: RE | Disposition: A | Discharge status: Home / Self Care | Payer: Self-pay | Source: Ambulatory Visit | Attending: Cardiology

## 2014-06-30 ENCOUNTER — Ambulatory Visit (HOSPITAL_COMMUNITY): Payer: Medicare Other | Admitting: Anesthesiology

## 2014-06-30 DIAGNOSIS — I10 Essential (primary) hypertension: Secondary | ICD-10-CM | POA: Diagnosis not present

## 2014-06-30 DIAGNOSIS — Z7982 Long term (current) use of aspirin: Secondary | ICD-10-CM | POA: Insufficient documentation

## 2014-06-30 DIAGNOSIS — I481 Persistent atrial fibrillation: Secondary | ICD-10-CM | POA: Diagnosis not present

## 2014-06-30 DIAGNOSIS — I4891 Unspecified atrial fibrillation: Secondary | ICD-10-CM | POA: Insufficient documentation

## 2014-06-30 HISTORY — PX: CARDIOVERSION: SHX1299

## 2014-06-30 LAB — POCT I-STAT, CHEM 8
BUN: 29 mg/dL — AB (ref 6–23)
CHLORIDE: 107 meq/L (ref 96–112)
Calcium, Ion: 1.23 mmol/L (ref 1.13–1.30)
Creatinine, Ser: 1.6 mg/dL — ABNORMAL HIGH (ref 0.50–1.35)
GLUCOSE: 110 mg/dL — AB (ref 70–99)
HCT: 49 % (ref 39.0–52.0)
Hemoglobin: 16.7 g/dL (ref 13.0–17.0)
POTASSIUM: 4 mmol/L (ref 3.5–5.1)
Sodium: 141 mmol/L (ref 135–145)
TCO2: 19 mmol/L (ref 0–100)

## 2014-06-30 SURGERY — CARDIOVERSION
Anesthesia: Monitor Anesthesia Care

## 2014-06-30 MED ORDER — SODIUM CHLORIDE 0.9 % IV SOLN
INTRAVENOUS | Status: DC
  Administered 2014-06-30: 07:00:00 via INTRAVENOUS

## 2014-06-30 MED ORDER — PROPOFOL 10 MG/ML IV BOLUS
INTRAVENOUS | Status: DC | PRN
  Administered 2014-06-30: 80 mg via INTRAVENOUS

## 2014-06-30 MED ORDER — APIXABAN 5 MG PO TABS
5.0000 mg | ORAL_TABLET | Freq: Once | ORAL | Status: AC
  Administered 2014-06-30: 5 mg via ORAL
  Filled 2014-06-30: qty 1

## 2014-06-30 MED ORDER — LIDOCAINE HCL (CARDIAC) 20 MG/ML IV SOLN
INTRAVENOUS | Status: DC | PRN
  Administered 2014-06-30: 50 mg via INTRAVENOUS

## 2014-06-30 NOTE — Anesthesia Postprocedure Evaluation (Signed)
  Anesthesia Post-op Note  Patient: Gregory [name]  Procedure(s) Performed: Procedure(s) with comments: CARDIOVERSION (N/A) - H&P in file  Patient Location: Endoscopy Unit  Anesthesia Type:MAC  Level of Consciousness: awake, alert  and oriented  Airway and Oxygen Therapy: Patient Spontanous Breathing  Post-op Pain: none  Post-op Assessment: Post-op Vital signs reviewed, Patient's Cardiovascular Status Stable, Respiratory Function Stable, Patent Airway and No signs of Nausea or vomiting  Post-op Vital Signs: Reviewed and stable  Last Vitals:  HR 58 BP 115/75 Sats 98% RR 20  Complications: No apparent anesthesia complications

## 2014-06-30 NOTE — Anesthesia Postprocedure Evaluation (Signed)
  Anesthesia Post-op Note  Patient: Gregory [name]  Procedure(s) Performed: Procedure(s) with comments: CARDIOVERSION (N/A) - H&P in file  Patient Location: PACU  Anesthesia Type:MAC  Level of Consciousness: awake and alert   Airway and Oxygen Therapy: Patient Spontanous Breathing and Patient connected to nasal cannula oxygen  Post-op Pain: none  Post-op Assessment: Post-op Vital signs reviewed  Post-op Vital Signs: Reviewed and stable  Last Vitals:  Filed Vitals:   06/30/14 0826  BP: 111/76  Pulse: 63  Temp:   Resp: 24    Complications: No apparent anesthesia complications

## 2014-06-30 NOTE — H&P (Addendum)
  Please see office visit notes for complete details of HPI.  

## 2014-06-30 NOTE — Transfer of Care (Signed)
Immediate Anesthesia Transfer of Care Note  Patient: Gregory ArbourDouglas E [name]  Procedure(s) Performed: Procedure(s) with comments: CARDIOVERSION (N/A) - H&P in file  Patient Location: Endoscopy Unit  Anesthesia Type:MAC  Level of Consciousness: awake, alert  and oriented  Airway & Oxygen Therapy: Patient Spontanous Breathing  Post-op Assessment: Report given to PACU RN, Post -op Vital signs reviewed and stable and Patient moving all extremities X 4  Post vital signs: Reviewed and stable  Complications: No apparent anesthesia complications

## 2014-06-30 NOTE — Interval H&P Note (Signed)
History and Physical Interval Note:  06/30/2014 8:10 AM  Gregory [name]  has presented today for surgery, with the diagnosis of AFIB  The various methods of treatment have been discussed with the patient and family. After consideration of risks, benefits and other options for treatment, the patient has consented to  Procedure(s) with comments: CARDIOVERSION (N/A) - H&P in file as a surgical intervention .  The patient's history has been reviewed, patient examined, no change in status, stable for surgery.  I have reviewed the patient's chart and labs.  Questions were answered to the patient's satisfaction.     Pamella PertGANJI,JAGADEESH R

## 2014-06-30 NOTE — Discharge Instructions (Signed)

## 2014-06-30 NOTE — CV Procedure (Signed)
Direct current cardioversion:  Indication symptomatic A. Fibrillation.  Procedure: Using 80 mg of IV Propofol and 50 IV Lidocaine (for reducing venous pain) for achieving deep sedation, synchronized direct current cardioversion performed. Patient was delivered with 100 Joules of electricity X 1 with success to NSR. Patient tolerated the procedure well. No immediate complication noted.

## 2014-07-03 ENCOUNTER — Encounter (HOSPITAL_COMMUNITY): Payer: Self-pay | Admitting: Cardiology

## 2014-07-06 DIAGNOSIS — I480 Paroxysmal atrial fibrillation: Secondary | ICD-10-CM | POA: Diagnosis not present

## 2014-07-06 DIAGNOSIS — Z7901 Long term (current) use of anticoagulants: Secondary | ICD-10-CM | POA: Diagnosis not present

## 2014-07-06 DIAGNOSIS — N182 Chronic kidney disease, stage 2 (mild): Secondary | ICD-10-CM | POA: Diagnosis not present

## 2014-07-06 DIAGNOSIS — Z9889 Other specified postprocedural states: Secondary | ICD-10-CM | POA: Diagnosis not present

## 2014-07-06 DIAGNOSIS — R0902 Hypoxemia: Secondary | ICD-10-CM | POA: Diagnosis not present

## 2014-07-20 DIAGNOSIS — I517 Cardiomegaly: Secondary | ICD-10-CM | POA: Diagnosis not present

## 2014-07-20 DIAGNOSIS — Z0000 Encounter for general adult medical examination without abnormal findings: Secondary | ICD-10-CM | POA: Diagnosis not present

## 2014-07-20 DIAGNOSIS — I4891 Unspecified atrial fibrillation: Secondary | ICD-10-CM | POA: Diagnosis not present

## 2014-08-07 DIAGNOSIS — I480 Paroxysmal atrial fibrillation: Secondary | ICD-10-CM | POA: Diagnosis not present

## 2014-08-21 DIAGNOSIS — Z23 Encounter for immunization: Secondary | ICD-10-CM | POA: Diagnosis not present

## 2014-09-04 ENCOUNTER — Encounter (HOSPITAL_COMMUNITY): Payer: Self-pay | Admitting: Anesthesiology

## 2014-09-04 DIAGNOSIS — I480 Paroxysmal atrial fibrillation: Secondary | ICD-10-CM | POA: Diagnosis not present

## 2014-09-04 DIAGNOSIS — N182 Chronic kidney disease, stage 2 (mild): Secondary | ICD-10-CM | POA: Diagnosis not present

## 2014-09-05 ENCOUNTER — Encounter (HOSPITAL_COMMUNITY)
Admission: RE | Disposition: A | Discharge status: Home / Self Care | Payer: Self-pay | Source: Ambulatory Visit | Attending: Cardiology

## 2014-09-05 ENCOUNTER — Encounter (HOSPITAL_COMMUNITY): Payer: Self-pay | Admitting: *Deleted

## 2014-09-05 ENCOUNTER — Ambulatory Visit (HOSPITAL_COMMUNITY)
Admission: RE | Admit: 2014-09-05 | Discharge: 2014-09-05 | Disposition: A | Discharge status: Home / Self Care | Payer: Medicare Other | Source: Ambulatory Visit | Attending: Cardiology | Admitting: Cardiology

## 2014-09-05 ENCOUNTER — Ambulatory Visit (HOSPITAL_COMMUNITY): Payer: Medicare Other | Admitting: Anesthesiology

## 2014-09-05 DIAGNOSIS — I10 Essential (primary) hypertension: Secondary | ICD-10-CM | POA: Insufficient documentation

## 2014-09-05 DIAGNOSIS — I4891 Unspecified atrial fibrillation: Secondary | ICD-10-CM | POA: Diagnosis not present

## 2014-09-05 DIAGNOSIS — I481 Persistent atrial fibrillation: Secondary | ICD-10-CM | POA: Diagnosis not present

## 2014-09-05 HISTORY — PX: CARDIOVERSION: SHX1299

## 2014-09-05 SURGERY — CARDIOVERSION
Anesthesia: Monitor Anesthesia Care

## 2014-09-05 MED ORDER — SODIUM CHLORIDE 0.9 % IV SOLN
INTRAVENOUS | Status: DC
  Administered 2014-09-05: 10:00:00 via INTRAVENOUS
  Administered 2014-09-05: 500 mL via INTRAVENOUS

## 2014-09-05 NOTE — Discharge Instructions (Signed)
Electrical Cardioversion, Care After °Refer to this sheet in the next few weeks. These instructions provide you with information on caring for yourself after your procedure. Your health care provider may also give you more specific instructions. Your treatment has been planned according to current medical practices, but problems sometimes occur. Call your health care provider if you have any problems or questions after your procedure. °WHAT TO EXPECT AFTER THE PROCEDURE °After your procedure, it is typical to have the following sensations: °· Some redness on the skin where the shocks were delivered. If this is tender, a sunburn lotion or hydrocortisone cream may help. °· Possible return of an abnormal heart rhythm within hours or days after the procedure. °HOME CARE INSTRUCTIONS °· Take medicines only as directed by your health care provider. Be sure you understand how and when to take your medicine. °· Learn how to feel your pulse and check it often. °· Limit your activity for 48 hours after the procedure or as directed by your health care provider. °· Avoid or minimize caffeine and other stimulants as directed by your health care provider. °SEEK MEDICAL CARE IF: °· You feel like your heart is beating too fast or your pulse is not regular. °· You have any questions about your medicines. °· You have bleeding that will not stop. °SEEK IMMEDIATE MEDICAL CARE IF: °· You are dizzy or feel faint. °· It is hard to breathe or you feel short of breath. °· There is a change in discomfort in your chest. °· Your speech is slurred or you have trouble moving an arm or leg on one side of your body. °· You get a serious muscle cramp that does not go away. °· Your fingers or toes turn cold or blue. °Document Released: 03/23/2013 Document Revised: 10/17/2013 Document Reviewed: 03/23/2013 °[employer]® Patient Information ©2015 [employer]. This information is not intended to replace advice given to you by your health care provider.  Make sure you discuss any questions you have with your health care provider. ° ° °Conscious Sedation, Adult, Care After °Refer to this sheet in the next few weeks. These instructions provide you with information on caring for yourself after your procedure. Your health care provider may also give you more specific instructions. Your treatment has been planned according to current medical practices, but problems sometimes occur. Call your health care provider if you have any problems or questions after your procedure. °WHAT TO EXPECT AFTER THE PROCEDURE  °After your procedure: °· You may feel sleepy, clumsy, and have poor balance for several hours. °· Vomiting may occur if you eat too soon after the procedure. °HOME CARE INSTRUCTIONS °· Do not participate in any activities where you could become injured for at least 24 hours. Do not: °¨ Drive. °¨ Swim. °¨ Ride a bicycle. °¨ Operate heavy machinery. °¨ Cook. °¨ Use power tools. °¨ Climb ladders. °¨ Work from a high place. °· Do not make important decisions or sign legal documents until you are improved. °· If you vomit, drink water, juice, or soup when you can drink without vomiting. Make sure you have little or no nausea before eating solid foods. °· Only take over-the-counter or prescription medicines for pain, discomfort, or fever as directed by your health care provider. °· Make sure you and your family fully understand everything about the medicines given to you, including what side effects may occur. °· You should not drink alcohol, take sleeping pills, or take medicines that cause drowsiness for at least 24   hours. °· If you smoke, do not smoke without supervision. °· If you are feeling better, you may resume normal activities 24 hours after you were sedated. °· Keep all appointments with your health care provider. °SEEK MEDICAL CARE IF: °· Your skin is pale or bluish in color. °· You continue to feel nauseous or vomit. °· Your pain is getting worse and is not  helped by medicine. °· You have bleeding or swelling. °· You are still sleepy or feeling clumsy after 24 hours. °SEEK IMMEDIATE MEDICAL CARE IF: °· You develop a rash. °· You have difficulty breathing. °· You develop any type of allergic problem. °· You have a fever. °MAKE SURE YOU: °· Understand these instructions. °· Will watch your condition. °· Will get help right away if you are not doing well or get worse. °Document Released: 03/23/2013 Document Reviewed: 03/23/2013 °[employer]® Patient Information ©2015 [employer]. This information is not intended to replace advice given to you by your health care provider. Make sure you discuss any questions you have with your health care provider. ° °

## 2014-09-05 NOTE — CV Procedure (Signed)
Direct current cardioversion:  Indication symptomatic A. Fibrillation.  Procedure: Using 100 mg of IV Propofol for achieving deep sedation, synchronized direct current cardioversion performed. Patient was delivered with 100 Joules of electricity X 1 with success to NSR. Patient tolerated the procedure well. No immediate complication noted.

## 2014-09-05 NOTE — Anesthesia Postprocedure Evaluation (Signed)
  Anesthesia Post-op Note  Patient: Gregory [name]  Procedure(s) Performed: Procedure(s): CARDIOVERSION (N/A)  Patient Location: PACU and Endoscopy Unit  Anesthesia Type:MAC  Level of Consciousness: awake, alert  and oriented  Airway and Oxygen Therapy: Patient Spontanous Breathing  Post-op Pain: none  Post-op Assessment: Post-op Vital signs reviewed and Patient's Cardiovascular Status Stable  Post-op Vital Signs: Reviewed and stable  Last Vitals:  Filed Vitals:   09/05/14 0756  BP: 149/96  Temp: 36.7 C  Resp: 22    Complications: No apparent anesthesia complications

## 2014-09-05 NOTE — Transfer of Care (Signed)
Immediate Anesthesia Transfer of Care Note  Patient: Gregory ArbourDouglas E Morreale  Procedure(s) Performed: Procedure(s): CARDIOVERSION (N/A)  Patient Location: PACU and Endoscopy Unit  Anesthesia Type:MAC  Level of Consciousness: awake, alert  and oriented  Airway & Oxygen Therapy: Patient Spontanous Breathing and Patient connected to nasal cannula oxygen  Post-op Assessment: Report given to RN and Post -op Vital signs reviewed and stable  Post vital signs: Reviewed and stable  Last Vitals:  Filed Vitals:   09/05/14 0756  BP: 149/96  Temp: 36.7 C  Resp: 22    Complications: No apparent anesthesia complications

## 2014-09-05 NOTE — Anesthesia Preprocedure Evaluation (Addendum)
Anesthesia Evaluation  Patient identified by MRN, date of birth, ID band Patient awake    Reviewed: Allergy & Precautions, NPO status , Patient's Chart, lab work & pertinent test results  Airway Mallampati: II  TM Distance: >3 FB Neck ROM: Full    Dental no notable dental hx.    Pulmonary  breath sounds clear to auscultation  Pulmonary exam normal       Cardiovascular hypertension, Pt. on medications + dysrhythmias Atrial Fibrillation Rhythm:Irregular Rate:Normal  ECHO 2015 55%, multiple cardio versions in past   Neuro/Psych    GI/Hepatic negative GI ROS, Neg liver ROS,   Endo/Other  negative endocrine ROS  Renal/GU Renal InsufficiencyRenal disease     Musculoskeletal   Abdominal   Peds  Hematology negative hematology ROS (+)   Anesthesia Other Findings   Reproductive/Obstetrics                            Anesthesia Physical Anesthesia Plan  ASA: III  Anesthesia Plan: MAC   Post-op Pain Management:    Induction: Intravenous  Airway Management Planned: Nasal Cannula  Additional Equipment:   Intra-op Plan:   Post-operative Plan: Extubation in OR  Informed Consent: I have reviewed the patients History and Physical, chart, labs and discussed the procedure including the risks, benefits and alternatives for the proposed anesthesia with the patient or authorized representative who has indicated his/her understanding and acceptance.   Dental advisory given  Plan Discussed with: CRNA and Surgeon  Anesthesia Plan Comments:        Anesthesia Quick Evaluation

## 2014-09-05 NOTE — Interval H&P Note (Signed)
History and Physical Interval Note:  09/05/2014 9:42 AM  Gregory [name]  has presented today for surgery, with the diagnosis of afib  The various methods of treatment have been discussed with the patient and family. After consideration of risks, benefits and other options for treatment, the patient has consented to  Procedure(s): CARDIOVERSION (N/A) as a surgical intervention .  The patient's history has been reviewed, patient examined, no change in status, stable for surgery.  I have reviewed the patient's chart and labs.  Questions were answered to the patient's satisfaction.     Pamella PertGANJI,JAGADEESH R

## 2014-09-05 NOTE — H&P (Signed)
  Please see office visit notes for complete details of HPI.  

## 2014-09-06 ENCOUNTER — Encounter (HOSPITAL_COMMUNITY): Payer: Self-pay | Admitting: Cardiology

## 2014-09-18 DIAGNOSIS — Z9889 Other specified postprocedural states: Secondary | ICD-10-CM | POA: Diagnosis not present

## 2014-09-18 DIAGNOSIS — I480 Paroxysmal atrial fibrillation: Secondary | ICD-10-CM | POA: Diagnosis not present

## 2014-09-18 DIAGNOSIS — Z7901 Long term (current) use of anticoagulants: Secondary | ICD-10-CM | POA: Diagnosis not present

## 2014-09-18 DIAGNOSIS — I10 Essential (primary) hypertension: Secondary | ICD-10-CM | POA: Diagnosis not present

## 2014-10-02 DIAGNOSIS — I10 Essential (primary) hypertension: Secondary | ICD-10-CM | POA: Diagnosis not present

## 2014-10-08 ENCOUNTER — Encounter (HOSPITAL_COMMUNITY): Payer: Self-pay | Admitting: Cardiology

## 2014-10-08 NOTE — Addendum Note (Signed)
Addendum  created 10/08/14 0913 by Achille RichAdam Alizay Bronkema, MD   Modules edited: Anesthesia Events, Anesthesia Responsible Staff

## 2014-10-09 DIAGNOSIS — I480 Paroxysmal atrial fibrillation: Secondary | ICD-10-CM | POA: Diagnosis not present

## 2014-11-01 DIAGNOSIS — I10 Essential (primary) hypertension: Secondary | ICD-10-CM | POA: Diagnosis not present

## 2014-11-01 DIAGNOSIS — I480 Paroxysmal atrial fibrillation: Secondary | ICD-10-CM | POA: Diagnosis not present

## 2014-11-01 DIAGNOSIS — I440 Atrioventricular block, first degree: Secondary | ICD-10-CM | POA: Diagnosis not present

## 2014-11-01 DIAGNOSIS — N182 Chronic kidney disease, stage 2 (mild): Secondary | ICD-10-CM | POA: Diagnosis not present

## 2014-12-11 ENCOUNTER — Other Ambulatory Visit: Payer: Self-pay

## 2015-01-17 DIAGNOSIS — H2512 Age-related nuclear cataract, left eye: Secondary | ICD-10-CM | POA: Diagnosis not present

## 2015-01-22 DIAGNOSIS — H2512 Age-related nuclear cataract, left eye: Secondary | ICD-10-CM | POA: Diagnosis not present

## 2015-02-01 DIAGNOSIS — H2512 Age-related nuclear cataract, left eye: Secondary | ICD-10-CM | POA: Diagnosis not present

## 2015-02-01 DIAGNOSIS — Z961 Presence of intraocular lens: Secondary | ICD-10-CM | POA: Diagnosis not present

## 2015-02-01 DIAGNOSIS — H40013 Open angle with borderline findings, low risk, bilateral: Secondary | ICD-10-CM | POA: Diagnosis not present

## 2015-02-01 DIAGNOSIS — H5703 Miosis: Secondary | ICD-10-CM | POA: Diagnosis not present

## 2015-02-26 DIAGNOSIS — H21562 Pupillary abnormality, left eye: Secondary | ICD-10-CM | POA: Diagnosis not present

## 2015-02-26 DIAGNOSIS — H2512 Age-related nuclear cataract, left eye: Secondary | ICD-10-CM | POA: Diagnosis not present

## 2015-03-01 DIAGNOSIS — H02833 Dermatochalasis of right eye, unspecified eyelid: Secondary | ICD-10-CM | POA: Diagnosis not present

## 2015-03-01 DIAGNOSIS — H43812 Vitreous degeneration, left eye: Secondary | ICD-10-CM | POA: Diagnosis not present

## 2015-03-01 DIAGNOSIS — Z961 Presence of intraocular lens: Secondary | ICD-10-CM | POA: Diagnosis not present

## 2015-05-17 DIAGNOSIS — I480 Paroxysmal atrial fibrillation: Secondary | ICD-10-CM | POA: Diagnosis not present

## 2015-05-17 DIAGNOSIS — N182 Chronic kidney disease, stage 2 (mild): Secondary | ICD-10-CM | POA: Diagnosis not present

## 2015-05-17 DIAGNOSIS — I440 Atrioventricular block, first degree: Secondary | ICD-10-CM | POA: Diagnosis not present

## 2015-05-17 DIAGNOSIS — I10 Essential (primary) hypertension: Secondary | ICD-10-CM | POA: Diagnosis not present

## 2015-05-23 DIAGNOSIS — N429 Disorder of prostate, unspecified: Secondary | ICD-10-CM | POA: Diagnosis not present

## 2015-05-23 DIAGNOSIS — I10 Essential (primary) hypertension: Secondary | ICD-10-CM | POA: Diagnosis not present

## 2015-07-05 DIAGNOSIS — R351 Nocturia: Secondary | ICD-10-CM | POA: Diagnosis not present

## 2015-07-05 DIAGNOSIS — N401 Enlarged prostate with lower urinary tract symptoms: Secondary | ICD-10-CM | POA: Diagnosis not present

## 2015-07-05 DIAGNOSIS — R972 Elevated prostate specific antigen [PSA]: Secondary | ICD-10-CM | POA: Diagnosis not present

## 2015-07-26 DIAGNOSIS — R972 Elevated prostate specific antigen [PSA]: Secondary | ICD-10-CM | POA: Diagnosis not present

## 2015-07-30 DIAGNOSIS — H26492 Other secondary cataract, left eye: Secondary | ICD-10-CM | POA: Diagnosis not present

## 2015-08-06 DIAGNOSIS — R972 Elevated prostate specific antigen [PSA]: Secondary | ICD-10-CM | POA: Diagnosis not present

## 2015-11-16 DIAGNOSIS — N182 Chronic kidney disease, stage 2 (mild): Secondary | ICD-10-CM | POA: Diagnosis not present

## 2015-11-16 DIAGNOSIS — N400 Enlarged prostate without lower urinary tract symptoms: Secondary | ICD-10-CM | POA: Diagnosis not present

## 2015-11-16 DIAGNOSIS — I480 Paroxysmal atrial fibrillation: Secondary | ICD-10-CM | POA: Diagnosis not present

## 2015-11-16 DIAGNOSIS — I440 Atrioventricular block, first degree: Secondary | ICD-10-CM | POA: Diagnosis not present

## 2015-11-20 DIAGNOSIS — N400 Enlarged prostate without lower urinary tract symptoms: Secondary | ICD-10-CM | POA: Diagnosis not present

## 2015-11-20 DIAGNOSIS — I10 Essential (primary) hypertension: Secondary | ICD-10-CM | POA: Diagnosis not present

## 2015-12-20 DIAGNOSIS — I480 Paroxysmal atrial fibrillation: Secondary | ICD-10-CM | POA: Diagnosis not present

## 2015-12-20 DIAGNOSIS — N182 Chronic kidney disease, stage 2 (mild): Secondary | ICD-10-CM | POA: Diagnosis not present

## 2015-12-20 DIAGNOSIS — I440 Atrioventricular block, first degree: Secondary | ICD-10-CM | POA: Diagnosis not present

## 2015-12-21 ENCOUNTER — Ambulatory Visit: Admit: 2015-12-21 | Payer: Self-pay | Admitting: Cardiology

## 2015-12-21 DIAGNOSIS — I480 Paroxysmal atrial fibrillation: Secondary | ICD-10-CM | POA: Diagnosis not present

## 2015-12-21 DIAGNOSIS — N182 Chronic kidney disease, stage 2 (mild): Secondary | ICD-10-CM | POA: Diagnosis not present

## 2015-12-21 DIAGNOSIS — I440 Atrioventricular block, first degree: Secondary | ICD-10-CM | POA: Diagnosis not present

## 2015-12-21 SURGERY — CARDIOVERSION
Anesthesia: Choice

## 2015-12-23 NOTE — H&P (Signed)
OFFICE VISIT NOTES COPIED TO EPIC FOR DOCUMENTATION  History of Present Illness Gregory [name] AGNP-C; 10-Jan-2016 9:28 AM) The patient is a 75 year old male who presents for a follow-up for Atrial fibrillation. He has paroxysmal atrial fibrillation. He has history of cardioversion (09/05/2014, 06/30/2014, 10/28/2013, 06/08/2012 (on Flecainide), 05/03/2012), A. Fib to NSR. He was doing well until the morning of January 10, 2016 when he woke up with palpitations, thought that he was in atrial fibrillation and took two flecainide tablets. Due to persistence of symptoms, he was seen in the office that morning and diltiazem was increased. Otherwise he felt well, except for palpitations, denied any other symptoms. He is presently on anticoagulation.  Past evaluation has included stress ECG testing (Treadmill Exercise stress 10/09/2014 performed to evaluate arrhythmia on flecainide- Normal stress response.) and echocardiography. He is fairly active, and denies any chest pain, shortness breath, PND or orthopnea. No GI bleed, no dark stools. In the fall of 2016, his PSA was elevated, underwent prostate biopsy which was benign.  He returns this morning for reevaluation of atrial fibrillation. He has increased dose of diltiazem as instructed. He remains in A. fib, although rate controlled with ventricular rate in the 80s. Remains symptomatic. He is presently NPO.   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 Trifascicular block. Normal BP response. There was no ST-T changes of ischemia with exercise stress test. No stress induced arrhythmias. Stress terminated due to THR (>85% MPHR)/MPHR met. The patient  exercised according to Bruce Protocol, Total time recorded 06:50 min achieving max heart rate of 158 which was 106% of MPHR for age and 8.34 METS of work. Labwork  11/20/2015: PSA normal, total cholesterol 187, triglycerides 97, HDL 52, LDL 116, creatinine 1.61, eGFR 42, potassium 4.5, CMP normal, CBC normal 05/23/2015: Total cholesterol 208, triglycerides 113, HDL 53, LDL 132, creatinine 1.53, eGFR 44, potassium 4.6, CMP otherwise normal, CBC normal, PSA elevated at 5.3 Labs 10/02/2014:Glucose 106, creatinine 1.48, eGFR 47, CMP otherwise normal, TSH 2.780. No change since Labs 01/11/2014, 04/27/2012. Labs 08/07/2014: CBC normal. 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 Bleeding risk is 1.88-3.2%. ASA or long-term anticoagulation appropriate. Echo- 07/28/13 1. Left ventricular cavity is normal in size. Mild concentric hypertrophy. Normal global wall motion. Normal systolic global function. Calculated EF 63%. 2. Left atrial cavity is moderate to severely dilated. Right atrial cavity is mildly dilated. 4. Right ventricular cavity is mildly enlarged. 5. Mitral valve structurally normal. Moderate mitral regurgitation. 6. Tricuspid valve structurally normal. Mild to moderate tricuspid regurgitation. Mild pulmonary hypertension with approx. PA syst. pressure of 36 mm of Hg. Echo 04/02/12: Hyperdynamic LVEF. LA size 58 mm. Mild TR and moderate pulmonary hypertension. Lexiscan stress 04/23/12: Normal perfusion. Normal LVEF. No ischemia. History of cardioversion (Q68.341) 05/03/2012 Cardioversion 09/05/2014, 06/30/2014, 10/28/2013, 06/08/2012(on Flecainide), 05/03/2012, A. Fib to NSR Benign essential HTN (I10) 09/18/2014  Allergies (April Garrison; 01/10/2016 8:35 AM) No Known Drug Allergies 03/31/2012  Family History (April Louretta Shorten; 2016-01-10 8:35 AM) Mother  Deceased. at age 27 from bone  cancer. Father  Deceased. at age 109 from suicide; had CHF; h/o stroke at age 82. Siblings  In good health. 2-  Social History (April Garrison; 12/26/2015 8:35 AM) Current tobacco use  Never smoker. Alcohol Use  Drinks wine. daily Marital status  Married. Number of Children  2. Living Situation  Lives with spouse.  Past Surgical History (April Garrison; 2015/12/26 8:35 AM) Hernia Repair  in the early 50's. Elective external cardioversion (08144)  Cardioversion 05/03/12: Successful from A. Fib to NSR. Recurrence and started on Flecainide, DCCV 06/08/12: SR with 1st degree AV Block. Cardioversion 10/28/2013: Successful from atrial fibrillation to normal sinus rhythm with 100 J. Arthroscopic Knee Surgery - Right 2000 Cataract Extraction-Right 2011 Cataract Extraction-Left 02/2015  Medication History (April Garrison; 12/26/2015 8:44 AM) [place] CD (180MG Capsule ER 24HR, 1 (one) Capsule Oral daily, Taken starting 12/20/2015) Active. Flecainide Acetate (150MG Tablet, 1 (one) Tablet Tablet Tablet Oral two times daily, Taken starting 09/30/2015) Active. Eliquis (5MG Tablet, 1 Tablet Tablet Tablet Oral two times daily, Taken starting 08/02/2015) Active. Lisinopril (5MG Tablet, 1 (one) Tablet Tablet Tablet Oral daily, Taken starting 08/02/2015) Active. Multivitamins (1 Oral daily) Active. Metamucil (30.9% Powder, 1 tsp Oral daily) Active. Medications Reconciled (verbally with pt/ no list or medication present)  Diagnostic Studies History (April Garrison; 12-26-2015 8:35 AM) Stress cardiolite stress test  Lexiscan stress 04/23/12: Normal perfusion. Normal LVEF. No ischemia. Echocardiogram  Echo- 07/28/13 1. Left ventricular cavity is normal in size. Mild concentric hypertrophy. Normal global wall motion. Normal systolic global function. Calculated EF 63%. 2. Left atrial cavity is moderate to severely dilated. 3. Right atrial cavity is mildly dilated. 4. Right ventricular cavity is mildly  enlarged. 5. Mitral valve structurally normal. Moderate mitral regurgitation. 6. Tricuspid valve structurally normal. Mild to moderate tricuspid regurgitation. Mild pulmonary hypertension with approx. PA syst. pressure of 36 mm of Hg. Echo 04/02/12: Hyperdynamic LVEF. LA size 58 mm. Mild TR and moderate pulmonary hypertension. Colonoscopy 2012 Normal. Cardioversion 10/31/2013 Cardioversion 09/05/2014 06/30/2014, 10/28/2013, 06/08/2012(on Flecainide), 05/03/2012, A. Fib to NSR Treadmill stress test 10/09/2014 Indication: Atrial Fibrillation Resting EKG SR with 1st degree AV Block, LAD, LAFB, RBBB. Trifascicular block. Normal BP response. There was no ST-T changes of ischemia with exercise stress test. No stress induced arrhythmias. Stress terminated due to THR (>85% MPHR)/MPHR met. The patient exercised according to Bruce Protocol, Total time recorded 06:50 min achieving max heart rate of 158 which was 106% of MPHR for age and 8.34 METS of work. Prostate Biopsy 07/2015  Other Problems (April Garrison; 12-26-15 8:35 AM) Hospitalized at [facility] from 03/31/12 - 04/03/12 due to pneumonia.     Review of Systems (Bridgette Ebony Hail AGNP-C; 2015-12-26 9:29 AM) General Not Present- Fever. Respiratory Not Present- Bloody sputum, Difficulty Breathing on Exertion and Wakes up from Sleep Wheezing or Short of Breath. Cardiovascular Present- Irregular Heart Beat and Palpitations. Not Present- Chest Pain, Edema, Leg Cramps and Paroxysmal Nocturnal Dyspnea. Gastrointestinal Not Present- Abdominal Pain, Black, Tarry Stool, Bloody Stool and Heartburn. Musculoskeletal Not Present- Claudication and Joint Pain. Neurological Not Present- Dizziness, Focal Neurological Symptoms and Syncope. Hematology Not Present- Blood Clots, Easy Bruising and Nose Bleed. All other systems negative  Vitals (April Garrison; 12/26/2015 8:52 AM) 12/26/2015 8:39 AM Weight: 200.5 lb Height: 71in Body Surface Area: 2.11 m Body  Mass Index: 27.96 kg/m  Pulse: 95 (Regular)  P.OX: 98% (Room air) BP: 118/74 (Sitting, Left Arm, Standard)       Physical Exam (Bridgette Allison AGNP-C; 12-26-15 9:51 AM) General Mental Status-Alert. General Appearance-Cooperative, Appears stated age, Not in acute distress. Orientation-Oriented X3. Build & Nutrition-Well built.  Head and Neck Thyroid Gland  Characteristics - no palpable nodules, no palpable enlargement.  Chest and Lung Exam Palpation Tender - No chest wall tenderness.  Cardiovascular Cardiovascular examination reveals -carotid auscultation reveals no bruits, femoral artery auscultation bilaterally reveals normal pulses, no bruits, no thrills, normal pedal pulses bilaterally and no digital clubbing, cyanosis, edema, increased warmth or tenderness. Inspection Jugular vein - Right - No Distention. Auscultation Rhythm - Irregular. Heart Sounds - S1 WNL(distant heart sounds) and S2 WNL, No S3, No S4. Murmurs & Other Heart Sounds: Murmur - Location - Sternal Border - Right. Timing - Early diastolic. Grade - II/VI.  Abdomen Palpation/Percussion Normal exam - Non Tender and No hepatosplenomegaly. Auscultation Normal exam - Bowel sounds normal.  Neurologic Motor-Grossly intact without any focal deficits.  Musculoskeletal Global Assessment Left Lower Extremity - normal range of motion without pain. Right Lower Extremity - normal range of motion without pain.    Assessment & Plan (Bridgette Ebony Hail AGNP-C; 12/21/2015 9:51 AM) Paroxysmal atrial fibrillation (I48.0) Story: CHA2DS2-VASc Score is 1 with yearly risk of stroke of 1.3%. Bleeding risk is 1.88-3.2%. ASA or long-term anticoagulation appropriate.  Echo- 07/28/13 1. Left ventricular cavity is normal in size. Mild concentric hypertrophy. Normal global wall motion. Normal systolic global function. Calculated EF 63%. 2. Left atrial cavity is moderate to severely dilated. Right atrial cavity is  mildly dilated. 4. Right ventricular cavity is mildly enlarged. 5. Mitral valve structurally normal. Moderate mitral regurgitation. 6. Tricuspid valve structurally normal. Mild to moderate tricuspid regurgitation. Mild pulmonary hypertension with approx. PA syst. pressure of 36 mm of Hg. Echo 04/02/12: Hyperdynamic LVEF. LA size 58 mm. Mild TR and moderate pulmonary hypertension.  Lexiscan stress 04/23/12: Normal perfusion. Normal LVEF. No ischemia. Impression: EKG 12/21/2015: Atrial fibrillation with controlled ventricular response at a rate of 86 bpm, left ward axis, incomplete right bundle branch block, poor R-wave progression, pulmonary disease pattern. Normal QT interval.  EKG 11/16/2015: Sinus rhythm with prolonged first-degree AV block, PR interval 300 ms, borderline left atrial abnormality. Normal axis. Poor R-wave progression, cannot exclude anterior infarct old. Borderline low voltage complexes. Normal QT interval at 417 ms. No significant change from EKG 05/17/2015. 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 Trifascicular block. Normal BP response. There was no ST-T changes of ischemia with exercise stress test. No stress induced arrhythmias. Stress terminated due to THR (>85% MPHR)/MPHR met. The patient exercised according to Bruce Protocol, Total time recorded 06:50 min achieving max heart rate of 158 which was 106% of MPHR for age and 8.34 METS of work. Impression: EKG 11/01/2014: Sinus bradycardia at rate of 55 bpm with first-degree AV block, PR interval 320 ms. Left axis deviation, low voltage complexes. Normal QT interval. No change from 09/04/14. Chronic renal insufficiency, stage III (mild)  Labwork History of Present Illness Gregory [name] AGNP-C; 12/21/2015 9:28 AM) The patient is a 75  year old male who presents for a follow-up for Atrial fibrillation. He has paroxysmal atrial fibrillation. He has history of cardioversion (09/05/2014, 06/30/2014, 10/28/2013, 06/08/2012 (on Flecainide), 05/03/2012), A. Fib to NSR. He was doing well until the morning of 12/21/2015 when he woke up with palpitations, thought that he was in atrial fibrillation and took two flecainide tablets. Due to persistence of symptoms, he was seen in the office that morning and diltiazem was increased. Otherwise he felt well, except for palpitations, denied any other symptoms.  He is presently on anticoagulation.  Past evaluation has included stress ECG testing (Treadmill Exercise stress 10/09/2014 performed to evaluate arrhythmia on flecainide- Normal stress response.) and echocardiography. He is fairly active, and denies any chest pain, shortness breath, PND or orthopnea. No GI bleed, no dark stools. In the fall of 2016, his PSA was elevated, underwent prostate biopsy which was benign.  He returns this morning for reevaluation of atrial fibrillation. He has increased dose of diltiazem as instructed. He remains in A. fib, although rate controlled with ventricular rate in the 80s. Remains symptomatic. He is presently NPO.   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 Trifascicular block. Normal BP response. There was no ST-T changes of ischemia with exercise stress test. No stress induced arrhythmias. Stress terminated due to THR (>85% MPHR)/MPHR met. The patient exercised according to Bruce Protocol, Total time recorded 06:50 min achieving max heart rate of 158 which was 106% of MPHR for age and 8.34 METS of  work. Labwork  11/20/2015: PSA normal, total cholesterol 187, triglycerides 97, HDL 52, LDL 116, creatinine 1.61, eGFR 42, potassium 4.5, CMP normal, CBC normal 05/23/2015: Total cholesterol 208, triglycerides 113, HDL 53, LDL 132, creatinine 1.53, eGFR 44, potassium 4.6, CMP otherwise normal, CBC normal, PSA elevated at 5.3 Labs 10/02/2014:Glucose 106, creatinine 1.48, eGFR 47, CMP otherwise normal, TSH 2.780. No change since Labs 01/11/2014, 04/27/2012. Labs 08/07/2014: CBC normal. 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 Bleeding risk is 1.88-3.2%. ASA or long-term anticoagulation appropriate. Echo- 07/28/13 1. Left ventricular cavity is normal in size. Mild concentric hypertrophy. Normal global wall motion. Normal systolic global function. Calculated EF 63%. 2. Left atrial cavity is moderate to severely dilated. Right atrial cavity is mildly dilated. 4. Right ventricular cavity is mildly enlarged. 5. Mitral valve structurally normal. Moderate mitral regurgitation. 6. Tricuspid valve structurally normal. Mild to moderate tricuspid regurgitation. Mild pulmonary hypertension with approx. PA syst. pressure of 36 mm of Hg. Echo 04/02/12: Hyperdynamic LVEF. LA size 58 mm. Mild TR and moderate pulmonary hypertension. Lexiscan stress 04/23/12: Normal perfusion. Normal LVEF. No ischemia. History of cardioversion (L93.570) 05/03/2012 Cardioversion 09/05/2014, 06/30/2014, 10/28/2013, 06/08/2012(on Flecainide), 05/03/2012, A. Fib to NSR Benign essential HTN (I10) 09/18/2014  Allergies (April Garrison; 12-27-15 8:35 AM) No Known Drug Allergies 03/31/2012  Family History (April Louretta Shorten; 2015-12-27 8:35 AM) Mother  Deceased. at age 10 from bone cancer. Father  Deceased. at age 55 from suicide; had CHF; h/o stroke at age 80. Siblings  In good health. 2-  Social History (April Garrison; 12-27-2015  8:35 AM) Current tobacco use  Never smoker. Alcohol Use  Drinks wine. daily Marital status  Married. Number of Children  2. Living Situation  Lives with spouse.  Past Surgical History (April Garrison; Dec 27, 2015 8:35 AM) Hernia Repair  in the early 50's. Elective external cardioversion (17793)  Cardioversion 05/03/12: Successful from A. Fib to NSR. Recurrence and started on Flecainide, DCCV 06/08/12: SR with 1st degree AV Block. Cardioversion 10/28/2013: Successful from atrial fibrillation to normal sinus rhythm with 100 J. Arthroscopic Knee Surgery - Right 2000 Cataract Extraction-Right 2011 Cataract Extraction-Left 02/2015  Medication History (April Garrison; 27-Dec-2015 8:44 AM) [place]  CD (180MG Capsule ER 24HR, 1 (one) Capsule Oral daily, Taken starting 12/20/2015) Active. Flecainide Acetate (150MG Tablet, 1 (one) Tablet Tablet Tablet Oral two times daily, Taken starting 09/30/2015) Active. Eliquis (5MG Tablet, 1 Tablet Tablet Tablet Oral two times daily, Taken starting 08/02/2015) Active. Lisinopril (5MG Tablet, 1 (one) Tablet Tablet Tablet Oral daily, Taken starting 08/02/2015) Active. Multivitamins (1 Oral daily) Active. Metamucil (30.9% Powder, 1 tsp Oral daily) Active. Medications Reconciled (verbally with pt/ no list or medication present)  Diagnostic Studies History (April Garrison; 01-05-2016 8:35 AM) Stress cardiolite stress test  Lexiscan stress 04/23/12: Normal perfusion. Normal LVEF. No ischemia. Echocardiogram  Echo- 07/28/13 1. Left ventricular cavity is normal in size. Mild concentric hypertrophy. Normal global wall motion. Normal systolic global function. Calculated EF 63%. 2. Left atrial cavity is moderate to severely dilated. 3. Right atrial cavity is mildly dilated. 4. Right ventricular cavity is mildly enlarged. 5. Mitral valve structurally normal. Moderate mitral regurgitation. 6. Tricuspid valve structurally normal. Mild to moderate tricuspid  regurgitation. Mild pulmonary hypertension with approx. PA syst. pressure of 36 mm of Hg. Echo 04/02/12: Hyperdynamic LVEF. LA size 58 mm. Mild TR and moderate pulmonary hypertension. Colonoscopy 2012 Normal. Cardioversion 10/31/2013 Cardioversion 09/05/2014 06/30/2014, 10/28/2013, 06/08/2012(on Flecainide), 05/03/2012, A. Fib to NSR Treadmill stress test 10/09/2014 Indication: Atrial Fibrillation Resting EKG SR with 1st degree AV Block, LAD, LAFB, RBBB. Trifascicular block. Normal BP response. There was no ST-T changes of ischemia with exercise stress test. No stress induced arrhythmias. Stress terminated due to THR (>85% MPHR)/MPHR met. The patient exercised according to Bruce Protocol, Total time recorded 06:50 min achieving max heart rate of 158 which was 106% of MPHR for age and 8.34 METS of work. Prostate Biopsy 07/2015  Other Problems (April Garrison; Jan 05, 2016 8:35 AM) Hospitalized at [facility] from 03/31/12 - 04/03/12 due to pneumonia.     Review of Systems (Bridgette Ebony Hail AGNP-C; January 05, 2016 9:29 AM) General Not Present- Fever. Respiratory Not Present- Bloody sputum, Difficulty Breathing on Exertion and Wakes up from Sleep Wheezing or Short of Breath. Cardiovascular Present- Irregular Heart Beat and Palpitations. Not Present- Chest Pain, Edema, Leg Cramps and Paroxysmal Nocturnal Dyspnea. Gastrointestinal Not Present- Abdominal Pain, Black, Tarry Stool, Bloody Stool and Heartburn. Musculoskeletal Not Present- Claudication and Joint Pain. Neurological Not Present- Dizziness, Focal Neurological Symptoms and Syncope. Hematology Not Present- Blood Clots, Easy Bruising and Nose Bleed. All other systems negative  Vitals (April Garrison; 05-Jan-2016 8:52 AM) 2016/01/05 8:39 AM Weight: 200.5 lb Height: 71in Body Surface Area: 2.11 m Body Mass Index: 27.96 kg/m  Pulse: 95 (Regular)  P.OX: 98% (Room air) BP: 118/74 (Sitting, Left Arm, Standard)       Physical Exam  (Bridgette Allison AGNP-C; 2016-01-05 9:51 AM) General Mental Status-Alert. General Appearance-Cooperative, Appears stated age, Not in acute distress. Orientation-Oriented X3. Build & Nutrition-Well built.  Head and Neck Thyroid Gland Characteristics - no palpable nodules, no palpable enlargement.  Chest and Lung Exam Palpation Tender - No chest wall tenderness.  Cardiovascular Cardiovascular examination reveals -carotid auscultation reveals no bruits, femoral artery auscultation bilaterally reveals normal pulses, no bruits, no thrills, normal pedal pulses bilaterally and no digital clubbing, cyanosis, edema, increased warmth or tenderness. Inspection Jugular vein - Right - No Distention. Auscultation Rhythm - Irregular. Heart Sounds - S1 WNL(distant heart sounds) and S2 WNL, No S3, No S4. Murmurs & Other Heart Sounds: Murmur - Location - Sternal Border - Right. Timing - Early diastolic. Grade - II/VI.  Abdomen Palpation/Percussion Normal exam - Non Tender and  No hepatosplenomegaly. Auscultation Normal exam - Bowel sounds normal.  Neurologic Motor-Grossly intact without any focal deficits.  Musculoskeletal Global Assessment Left Lower Extremity - normal range of motion without pain. Right Lower Extremity - normal range of motion without pain.    Assessment & Plan (Bridgette Ebony Hail AGNP-C; 12/21/2015 9:51 AM) Paroxysmal atrial fibrillation (I48.0) Story: CHA2DS2-VASc Score is 1 with yearly risk of stroke of 1.3%. Bleeding risk is 1.88-3.2%. ASA or long-term anticoagulation appropriate.  Echo- 07/28/13 1. Left ventricular cavity is normal in size. Mild concentric hypertrophy. Normal global wall motion. Normal systolic global function. Calculated EF 63%. 2. Left atrial cavity is moderate to severely dilated. Right atrial cavity is mildly dilated. 4. Right ventricular cavity is mildly enlarged. 5. Mitral valve structurally normal. Moderate mitral regurgitation. 6.  Tricuspid valve structurally normal. Mild to moderate tricuspid regurgitation. Mild pulmonary hypertension with approx. PA syst. pressure of 36 mm of Hg. Echo 04/02/12: Hyperdynamic LVEF. LA size 58 mm. Mild TR and moderate pulmonary hypertension.  Lexiscan stress 04/23/12: Normal perfusion. Normal LVEF. No ischemia. Impression: EKG 12/21/2015: Atrial fibrillation with controlled ventricular response at a rate of 86 bpm, left ward axis, incomplete right bundle branch block, poor R-wave progression, pulmonary disease pattern. Normal QT interval.  EKG 11/16/2015: Sinus rhythm with prolonged first-degree AV block, PR interval 300 ms, borderline left atrial abnormality. Normal axis. Poor R-wave progression, cannot exclude anterior infarct old. Borderline low voltage complexes. Normal QT interval at 417 ms. No significant change from EKG 05/17/2015. 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 Trifascicular block. Normal BP response. There was no ST-T changes of ischemia with exercise stress test. No stress induced arrhythmias. Stress terminated due to THR (>85% MPHR)/MPHR met. The patient exercised according to Bruce Protocol, Total time recorded 06:50 min achieving max heart rate of 158 which was 106% of MPHR for age and 8.34 METS of work. Impression: EKG 11/01/2014: Sinus bradycardia at rate of 55 bpm with first-degree AV block, PR interval 320 ms. Left axis deviation, low voltage complexes. Normal QT interval. No change from 09/04/14. Chronic renal insufficiency, stage III  Labwork Story: 11/20/2015: PSA normal, total cholesterol 187, triglycerides 97, HDL 52, LDL 116, creatinine 1.61, eGFR 42, potassium 4.5, CMP normal, CBC normal  05/23/2015: Total cholesterol 208, triglycerides 113, HDL 53, LDL 132, creatinine  1.53, eGFR 44, potassium 4.6, CMP otherwise normal, CBC normal, PSA elevated at 5.3  Labs 10/02/2014:Glucose 106, creatinine 1.48, eGFR 47, CMP otherwise normal, TSH 2.780. No change since Labs 01/11/2014, 04/27/2012. Labs 08/07/2014: CBC normal. Current Plans Mechanism of underlying disease process and action of medications discussed with the patient. I discussed primary/secondary prevention and also dietary counseling was done. He remains in A. fib, rate controlled. Symptoms included palpitations. Unable to perform cardioversion today due to scheduling. He will continue present dose of medications and scheduled for cardioversion early next week. I have discussed regarding risks benefits rate control vs rhythm control with the patient. Patient understands cardiac arrest and need for CPR, aspiration pneumonia, but not limited to these. Follow-up after procedure for reevaluation and further recommendations.  *I have discussed this case with Dr. Einar Gip via telephone and he participated in formulating the plan.*  Addendum Note(Jagadeesh Carlynn Herald MD; 12/23/2015 10:52 AM) Labs 12/21/2015: Serum glucose 107 mg, nonfasting. Urine 25, serum creatinine 1.77, eGFR 37 mL. K 4.6. 11/20/2015: PSA normal,  total cholesterol 187, triglycerides 97, HDL 52, LDL 116, creatinine 1.61, eGFR 42, potassium 4.5, CMP normal, CBC normal  05/23/2015: Total cholesterol 208, triglycerides 113, HDL 53, LDL 132, creatinine 1.53, eGFR 44, potassium 4.6, CMP otherwise normal, CBC normal, PSA elevated at 5.3  Labs 10/02/2014:Glucose 106, creatinine 1.48, eGFR 47, CMP otherwise normal, TSH 2.780. No change since Labs 01/11/2014, 04/27/2012. Labs 08/07/2014: CBC normal.  Current Plans Mechanism of underlying disease process and action of medications discussed with the patient. I discussed primary/secondary prevention and also dietary counseling was done. He remains in A. fib, rate controlled. Symptoms included palpitations. Unable to perform  cardioversion today due to scheduling. He will continue present dose of medications and scheduled for cardioversion early next week. I have discussed regarding risks benefits rate control vs rhythm control with the patient. Patient understands cardiac arrest and need for CPR, aspiration pneumonia, but not limited to these. Follow-up after procedure for reevaluation and further recommendations.  *I have discussed this case with Dr. Einar Gip via telephone and he participated in formulating the plan.*

## 2015-12-24 ENCOUNTER — Encounter (HOSPITAL_COMMUNITY)
Admission: RE | Disposition: A | Discharge status: Home / Self Care | Payer: Self-pay | Source: Ambulatory Visit | Attending: Cardiology

## 2015-12-24 ENCOUNTER — Ambulatory Visit (HOSPITAL_COMMUNITY): Payer: Medicare Other | Admitting: Anesthesiology

## 2015-12-24 ENCOUNTER — Ambulatory Visit (HOSPITAL_COMMUNITY)
Admission: RE | Admit: 2015-12-24 | Discharge: 2015-12-24 | Disposition: A | Discharge status: Home / Self Care | Payer: Medicare Other | Source: Ambulatory Visit | Attending: Cardiology | Admitting: Cardiology

## 2015-12-24 ENCOUNTER — Encounter (HOSPITAL_COMMUNITY): Payer: Self-pay | Admitting: *Deleted

## 2015-12-24 DIAGNOSIS — I440 Atrioventricular block, first degree: Secondary | ICD-10-CM | POA: Insufficient documentation

## 2015-12-24 DIAGNOSIS — Z79899 Other long term (current) drug therapy: Secondary | ICD-10-CM | POA: Insufficient documentation

## 2015-12-24 DIAGNOSIS — I4891 Unspecified atrial fibrillation: Secondary | ICD-10-CM | POA: Diagnosis present

## 2015-12-24 DIAGNOSIS — N189 Chronic kidney disease, unspecified: Secondary | ICD-10-CM | POA: Diagnosis not present

## 2015-12-24 DIAGNOSIS — N183 Chronic kidney disease, stage 3 (moderate): Secondary | ICD-10-CM | POA: Diagnosis not present

## 2015-12-24 DIAGNOSIS — Z7901 Long term (current) use of anticoagulants: Secondary | ICD-10-CM | POA: Insufficient documentation

## 2015-12-24 DIAGNOSIS — I129 Hypertensive chronic kidney disease with stage 1 through stage 4 chronic kidney disease, or unspecified chronic kidney disease: Secondary | ICD-10-CM | POA: Insufficient documentation

## 2015-12-24 DIAGNOSIS — I480 Paroxysmal atrial fibrillation: Secondary | ICD-10-CM | POA: Diagnosis not present

## 2015-12-24 DIAGNOSIS — R001 Bradycardia, unspecified: Secondary | ICD-10-CM | POA: Diagnosis not present

## 2015-12-24 DIAGNOSIS — R0902 Hypoxemia: Secondary | ICD-10-CM | POA: Insufficient documentation

## 2015-12-24 HISTORY — PX: CARDIOVERSION: SHX1299

## 2015-12-24 LAB — POCT I-STAT 4, (NA,K, GLUC, HGB,HCT)
Glucose, Bld: 100 mg/dL — ABNORMAL HIGH (ref 65–99)
HEMATOCRIT: 46 % (ref 39.0–52.0)
HEMOGLOBIN: 15.6 g/dL (ref 13.0–17.0)
Potassium: 4 mmol/L (ref 3.5–5.1)
Sodium: 142 mmol/L (ref 135–145)

## 2015-12-24 SURGERY — CARDIOVERSION
Anesthesia: Monitor Anesthesia Care

## 2015-12-24 MED ORDER — PROPOFOL 10 MG/ML IV BOLUS
INTRAVENOUS | Status: DC | PRN
Start: 2015-12-24 — End: 2015-12-24
  Administered 2015-12-24: 100 mg via INTRAVENOUS

## 2015-12-24 MED ORDER — PROPOFOL 10 MG/ML IV BOLUS
INTRAVENOUS | Status: AC
  Filled 2015-12-24: qty 20

## 2015-12-24 MED ORDER — SODIUM CHLORIDE 0.9 % IV SOLN
INTRAVENOUS | Status: DC
  Administered 2015-12-24 (×2): via INTRAVENOUS

## 2015-12-24 MED ORDER — LIDOCAINE 2% (20 MG/ML) 5 ML SYRINGE
INTRAMUSCULAR | Status: AC
  Filled 2015-12-24: qty 5

## 2015-12-24 MED ORDER — LIDOCAINE 2% (20 MG/ML) 5 ML SYRINGE
INTRAMUSCULAR | Status: DC | PRN
  Administered 2015-12-24: 40 mg via INTRAVENOUS

## 2015-12-24 NOTE — Anesthesia Preprocedure Evaluation (Addendum)
Anesthesia Evaluation  Patient identified by MRN, date of birth, ID band Patient awake    Reviewed: Allergy & Precautions, NPO status , Patient's Chart, lab work & pertinent test results  Airway Mallampati: I  TM Distance: >3 FB Neck ROM: Full    Dental  (+) Teeth Intact, Dental Advisory Given   Pulmonary    breath sounds clear to auscultation       Cardiovascular hypertension, + dysrhythmias Atrial Fibrillation  Rhythm:Irregular Rate:Abnormal     Neuro/Psych negative neurological ROS  negative psych ROS   GI/Hepatic negative GI ROS, Neg liver ROS,   Endo/Other  negative endocrine ROS  Renal/GU Renal disease  negative genitourinary   Musculoskeletal negative musculoskeletal ROS (+)   Abdominal   Peds negative pediatric ROS (+)  Hematology negative hematology ROS (+)   Anesthesia Other Findings   Reproductive/Obstetrics negative OB ROS                            Lab Results  Component Value Date   WBC 4.2 04/02/2012   HGB 16.7 06/30/2014   HCT 49.0 06/30/2014   MCV 99.7 04/02/2012   PLT 161 04/02/2012   Lab Results  Component Value Date   CREATININE 1.60* 06/30/2014   BUN 29* 06/30/2014   NA 141 06/30/2014   K 4.0 06/30/2014   CL 107 06/30/2014   CO2 23 04/03/2012   Lab Results  Component Value Date   INR 1.14 03/31/2012   08/2014 EKG: SR, 1st degree AV block.   Anesthesia Physical Anesthesia Plan  ASA: III  Anesthesia Plan: MAC   Post-op Pain Management:    Induction: Intravenous  Airway Management Planned: Natural Airway  Additional Equipment:   Intra-op Plan:   Post-operative Plan:   Informed Consent: I have reviewed the patients History and Physical, chart, labs and discussed the procedure including the risks, benefits and alternatives for the proposed anesthesia with the patient or authorized representative who has indicated his/her understanding and  acceptance.   Dental advisory given  Plan Discussed with: CRNA  Anesthesia Plan Comments:         Anesthesia Quick Evaluation

## 2015-12-24 NOTE — Interval H&P Note (Signed)
History and Physical Interval Note:  12/24/2015 9:03 AM  Gregory [name]  has presented today for surgery, with the diagnosis of AFIB  The various methods of treatment have been discussed with the patient and family. After consideration of risks, benefits and other options for treatment, the patient has consented to  Procedure(s): CARDIOVERSION (N/A) as a surgical intervention .  The patient's history has been reviewed, patient examined, no change in status, stable for surgery.  I have reviewed the patient's chart and labs.  Questions were answered to the patient's satisfaction.     Yates DecampGANJI, Jmya Uliano

## 2015-12-24 NOTE — Discharge Instructions (Signed)
Electrical Cardioversion, Care After °Refer to this sheet in the next few weeks. These instructions provide you with information on caring for yourself after your procedure. Your health care provider may also give you more specific instructions. Your treatment has been planned according to current medical practices, but problems sometimes occur. Call your health care provider if you have any problems or questions after your procedure. °WHAT TO EXPECT AFTER THE PROCEDURE °After your procedure, it is typical to have the following sensations: °· Some redness on the skin where the shocks were delivered. If this is tender, a sunburn lotion or hydrocortisone cream may help. °· Possible return of an abnormal heart rhythm within hours or days after the procedure. °HOME CARE INSTRUCTIONS °· Take medicines only as directed by your health care provider. Be sure you understand how and when to take your medicine. °· Learn how to feel your pulse and check it often. °· Limit your activity for 48 hours after the procedure or as directed by your health care provider. °· Avoid or minimize caffeine and other stimulants as directed by your health care provider. °SEEK MEDICAL CARE IF: °· You feel like your heart is beating too fast or your pulse is not regular. °· You have any questions about your medicines. °· You have bleeding that will not stop. °SEEK IMMEDIATE MEDICAL CARE IF: °· You are dizzy or feel faint. °· It is hard to breathe or you feel short of breath. °· There is a change in discomfort in your chest. °· Your speech is slurred or you have trouble moving an arm or leg on one side of your body. °· You get a serious muscle cramp that does not go away. °· Your fingers or toes turn cold or blue. °  °This information is not intended to replace advice given to you by your health care provider. Make sure you discuss any questions you have with your health care provider. °  °Document Released: 03/23/2013 Document Revised: 06/23/2014  Document Reviewed: 03/23/2013 °Elsevier Interactive Patient Education ©2016 [employer] ° °

## 2015-12-24 NOTE — Transfer of Care (Signed)
Immediate Anesthesia Transfer of Care Note  Patient: Edd ArbourDouglas E Woodhead  Procedure(s) Performed: Procedure(s): CARDIOVERSION (N/A)  Patient Location: PACU and Endoscopy Unit  Anesthesia Type:MAC  Level of Consciousness: awake, alert , oriented and patient cooperative  Airway & Oxygen Therapy: Patient Spontanous Breathing and Patient connected to nasal cannula oxygen  Post-op Assessment: Report given to RN, Post -op Vital signs reviewed and stable and Patient moving all extremities  Post vital signs: Reviewed and stable  Last Vitals:  Filed Vitals:   12/24/15 0925 12/24/15 0926  BP: 112/69   Pulse: 54 51  Temp:    Resp: 24 20    Last Pain: There were no vitals filed for this visit.       Complications: No apparent anesthesia complications

## 2015-12-24 NOTE — CV Procedure (Signed)
Direct current cardioversion:  Indication symptomatic A. Fibrillation.  Procedure: Using 100 mg of IV Propofol and 40 IV Lidocaine (for reducing venous pain) for achieving deep sedation, synchronized direct current cardioversion performed. Patient was delivered with 100 Joules of electricity X 1 with success to SR with 1st degree AV Block. Patient tolerated the procedure well. No immediate complication noted.

## 2015-12-24 NOTE — Anesthesia Postprocedure Evaluation (Signed)
Anesthesia Post Note  Patient: Gregory [name]  Procedure(s) Performed: Procedure(s) (LRB): CARDIOVERSION (N/A)  Patient location during evaluation: PACU Anesthesia Type: MAC Level of consciousness: awake and alert Pain management: pain level controlled Vital Signs Assessment: post-procedure vital signs reviewed and stable Respiratory status: spontaneous breathing, nonlabored ventilation, respiratory function stable and patient connected to nasal cannula oxygen Cardiovascular status: stable and blood pressure returned to baseline Anesthetic complications: no    Last Vitals:  Filed Vitals:   12/24/15 0940 12/24/15 0950  BP: 132/85 124/72  Pulse: 53 56  Temp:    Resp: 17 13    Last Pain: There were no vitals filed for this visit.               Shelton SilvasKevin D Rhyan Wolters

## 2015-12-31 DIAGNOSIS — I480 Paroxysmal atrial fibrillation: Secondary | ICD-10-CM | POA: Diagnosis not present

## 2015-12-31 DIAGNOSIS — I440 Atrioventricular block, first degree: Secondary | ICD-10-CM | POA: Diagnosis not present

## 2015-12-31 DIAGNOSIS — N183 Chronic kidney disease, stage 3 (moderate): Secondary | ICD-10-CM | POA: Diagnosis not present

## 2015-12-31 DIAGNOSIS — Z9889 Other specified postprocedural states: Secondary | ICD-10-CM | POA: Diagnosis not present

## 2016-01-24 DIAGNOSIS — N183 Chronic kidney disease, stage 3 (moderate): Secondary | ICD-10-CM | POA: Diagnosis not present

## 2016-01-24 DIAGNOSIS — Z9889 Other specified postprocedural states: Secondary | ICD-10-CM | POA: Diagnosis not present

## 2016-01-24 DIAGNOSIS — I440 Atrioventricular block, first degree: Secondary | ICD-10-CM | POA: Diagnosis not present

## 2016-01-24 DIAGNOSIS — I480 Paroxysmal atrial fibrillation: Secondary | ICD-10-CM | POA: Diagnosis not present

## 2016-01-28 ENCOUNTER — Inpatient Hospital Stay (HOSPITAL_COMMUNITY)
Admission: RE | Admit: 2016-01-28 | Discharge: 2016-01-31 | DRG: 310 | Disposition: A | Discharge status: Home / Self Care | Payer: Medicare Other | Source: Ambulatory Visit | Attending: Cardiology | Admitting: Cardiology

## 2016-01-28 ENCOUNTER — Encounter (HOSPITAL_COMMUNITY): Payer: Self-pay | Admitting: General Practice

## 2016-01-28 ENCOUNTER — Other Ambulatory Visit: Payer: Self-pay

## 2016-01-28 DIAGNOSIS — R0602 Shortness of breath: Secondary | ICD-10-CM | POA: Diagnosis not present

## 2016-01-28 DIAGNOSIS — Z7901 Long term (current) use of anticoagulants: Secondary | ICD-10-CM | POA: Diagnosis not present

## 2016-01-28 DIAGNOSIS — N183 Chronic kidney disease, stage 3 (moderate): Secondary | ICD-10-CM | POA: Diagnosis present

## 2016-01-28 DIAGNOSIS — Z5181 Encounter for therapeutic drug level monitoring: Secondary | ICD-10-CM | POA: Diagnosis not present

## 2016-01-28 DIAGNOSIS — K648 Other hemorrhoids: Secondary | ICD-10-CM | POA: Diagnosis not present

## 2016-01-28 DIAGNOSIS — N19 Unspecified kidney failure: Secondary | ICD-10-CM | POA: Diagnosis not present

## 2016-01-28 DIAGNOSIS — Z8249 Family history of ischemic heart disease and other diseases of the circulatory system: Secondary | ICD-10-CM | POA: Diagnosis not present

## 2016-01-28 DIAGNOSIS — I129 Hypertensive chronic kidney disease with stage 1 through stage 4 chronic kidney disease, or unspecified chronic kidney disease: Secondary | ICD-10-CM | POA: Diagnosis not present

## 2016-01-28 DIAGNOSIS — I4891 Unspecified atrial fibrillation: Secondary | ICD-10-CM | POA: Diagnosis not present

## 2016-01-28 DIAGNOSIS — I480 Paroxysmal atrial fibrillation: Secondary | ICD-10-CM | POA: Diagnosis present

## 2016-01-28 DIAGNOSIS — Z79899 Other long term (current) drug therapy: Secondary | ICD-10-CM

## 2016-01-28 DIAGNOSIS — I10 Essential (primary) hypertension: Secondary | ICD-10-CM | POA: Diagnosis not present

## 2016-01-28 HISTORY — DX: Pneumonia, unspecified organism: J18.9

## 2016-01-28 HISTORY — DX: Chronic atrial fibrillation, unspecified: I48.20

## 2016-01-28 HISTORY — DX: Unspecified osteoarthritis, unspecified site: M19.90

## 2016-01-28 LAB — BASIC METABOLIC PANEL
Anion gap: 7 (ref 5–15)
BUN: 33 mg/dL — ABNORMAL HIGH (ref 6–20)
CALCIUM: 9.2 mg/dL (ref 8.9–10.3)
CO2: 23 mmol/L (ref 22–32)
CREATININE: 1.65 mg/dL — AB (ref 0.61–1.24)
Chloride: 107 mmol/L (ref 101–111)
GFR, EST AFRICAN AMERICAN: 46 mL/min — AB (ref >60)
GFR, EST NON AFRICAN AMERICAN: 39 mL/min — AB (ref >60)
Glucose, Bld: 113 mg/dL — ABNORMAL HIGH (ref 65–99)
Potassium: 4.3 mmol/L (ref 3.5–5.1)
SODIUM: 137 mmol/L (ref 135–145)

## 2016-01-28 LAB — MAGNESIUM: MAGNESIUM: 2.6 mg/dL — AB (ref 1.7–2.4)

## 2016-01-28 MED ORDER — SODIUM CHLORIDE 0.9% FLUSH: 3.0000 mL | INTRAVENOUS | Status: DC | PRN

## 2016-01-28 MED ORDER — LISINOPRIL 5 MG PO TABS
5.0000 mg | ORAL_TABLET | Freq: Every day | ORAL | Status: DC
  Administered 2016-01-29 – 2016-01-31 (×3): 5 mg via ORAL
  Filled 2016-01-28 (×3): qty 1

## 2016-01-28 MED ORDER — POTASSIUM CHLORIDE CRYS ER 20 MEQ PO TBCR: 20.0000 meq | EXTENDED_RELEASE_TABLET | Freq: Every day | ORAL | Status: DC | PRN

## 2016-01-28 MED ORDER — SODIUM CHLORIDE 0.9% FLUSH
3.0000 mL | Freq: Two times a day (BID) | INTRAVENOUS | Status: DC
  Administered 2016-01-28 – 2016-01-30 (×5): 3 mL via INTRAVENOUS

## 2016-01-28 MED ORDER — MAGNESIUM OXIDE 400 (241.3 MG) MG PO TABS: 400.0000 mg | ORAL_TABLET | Freq: Two times a day (BID) | ORAL | Status: DC

## 2016-01-28 MED ORDER — DOFETILIDE 250 MCG PO CAPS
250.0000 ug | ORAL_CAPSULE | Freq: Two times a day (BID) | ORAL | Status: DC
Start: 2016-01-28 — End: 2016-01-31
  Administered 2016-01-28 – 2016-01-31 (×6): 250 ug via ORAL
  Filled 2016-01-28 (×5): qty 1

## 2016-01-28 MED ORDER — MAGNESIUM OXIDE 400 (241.3 MG) MG PO TABS
400.0000 mg | ORAL_TABLET | Freq: Two times a day (BID) | ORAL | Status: DC
  Administered 2016-01-29 – 2016-01-31 (×5): 400 mg via ORAL
  Filled 2016-01-28 (×5): qty 1

## 2016-01-28 MED ORDER — MAGNESIUM OXIDE 400 (241.3 MG) MG PO TABS: 400.0000 mg | ORAL_TABLET | Freq: Every day | ORAL | Status: DC | PRN

## 2016-01-28 MED ORDER — SODIUM CHLORIDE 0.9 % IV SOLN
250.0000 mL | INTRAVENOUS | Status: DC | PRN
Start: 2016-01-28 — End: 2016-01-31
  Administered 2016-01-30: 15:00:00 via INTRAVENOUS

## 2016-01-28 MED ORDER — APIXABAN 5 MG PO TABS
5.0000 mg | ORAL_TABLET | Freq: Two times a day (BID) | ORAL | Status: DC
  Administered 2016-01-28 – 2016-01-31 (×6): 5 mg via ORAL
  Filled 2016-01-28 (×6): qty 1

## 2016-01-28 MED ORDER — METOPROLOL TARTRATE 25 MG PO TABS
25.0000 mg | ORAL_TABLET | Freq: Two times a day (BID) | ORAL | Status: DC
  Administered 2016-01-28 – 2016-01-31 (×6): 25 mg via ORAL
  Filled 2016-01-28 (×6): qty 1

## 2016-01-28 NOTE — H&P (Signed)
Gregory [name] is an 75 y.o. male.   Chief Complaint: Atrial fibrillation, Tikosyn initiation [name]  is a 75 y.o. male with a history of paroxysmal atrial fibrillation. He has history of multiple cardioversions (12/21/2015, 09/05/2014, 06/30/2014, 10/28/2013, 06/08/2012 (on Flecainide), 05/03/2012), A. Fib to SR.  After his last cardioversion on 12/21/2015, [place] went [place], states thathe went into atrial fibrillation while he was traveling. He has noticed mild worsening dyspnea and fatigue since being in A. Fib, but he was able to complete the trip. No GI bleed, no dark stools. His other history includes stage III chronic kidney disease. There is no history of hypertension or diabetes mellitus. Flecainide was discontinued and Tikosyn was recommended. Amiodarone or sotalol are not an option due to bradycardia and chronic renal failure. Diltiazem was also changed to metoprolol.   No new symptoms or concerns today, wife present at the bedside.  Past Medical History:  Diagnosis Date  . Diverticulosis of colon (without mention of hemorrhage)   . Dysrhythmia   . High blood pressure   . Internal hemorrhoids without mention of complication   . Personal history of colonic polyps 01/17/2002   adenomatous, tublar adenoma 02/10/2007  . Pneumonia     Past Surgical History:  Procedure Laterality Date  . CARDIOVERSION  05/03/2012   Procedure: CARDIOVERSION;  Surgeon: Pamella PertJagadeesh R Ganji, MD;  [facility] END[facility]COPY;  Service: Cardiovascular;  Laterality: N/A;  . CARDIOVERSION  06/08/2012   Procedure: CARDIOVERSION;  Surgeon: Pamella PertJagadeesh R Ganji, MD;  [facility];  Service: Cardiovascular;  Laterality: N/A;  Lawrence Marseillestrista /janie  . CARDIOVERSION N/A 10/28/2013   Procedure: CARDIOVERSION;  Surgeon: Pamella PertJagadeesh R Ganji, MD;  [facility];  Service: Cardiovascular;  Laterality: N/A;  . CARDIOVERSION N/A 06/30/2014   Procedure: CARDIOVERSION;   Surgeon: Pamella PertJagadeesh R Ganji, MD;  [facility];  Service: Cardiovascular;  Laterality: N/A;  H&P in file  . CARDIOVERSION N/A 09/05/2014   Procedure: CARDIOVERSION;  Surgeon: Yates DecampJay Ganji, MD;  [facility];  Service: Cardiovascular;  Laterality: N/A;  . CARDIOVERSION N/A 12/24/2015   Procedure: CARDIOVERSION;  Surgeon: Yates DecampJay Ganji, MD;  [facility];  Service: Cardiovascular;  Laterality: N/A;  . CATARACT EXTRACTION     right  . COLONOSCOPY    . INGUINAL HERNIA REPAIR     2 repairs as child  . KNEE ARTHROSCOPY     right    Family History  Problem Relation Age of Onset  . Heart disease Father   . Stroke Father   . Cancer Mother     bladder  . Colon cancer Neg Hx   . Stomach cancer Neg Hx    Social History:  reports that he has never smoked. He has never used smokeless tobacco. He reports that he drinks about 8.4 oz of alcohol per week . He reports that he does not use drugs.  Allergies: No Known Allergies  Review of Systems - History obtained from the patient General ROS: positive for  - fatigue negative for - malaise, night sweats, weight gain or weight loss Respiratory ROS: positive for - shortness of breath negative for - cough, orthopnea or wheezing Cardiovascular ROS: positive for - dyspnea on exertion and irregular heartbeat negative for - chest pain, edema, orthopnea or paroxysmal nocturnal dyspnea Neurological ROS: no TIA or stroke symptoms  Blood pressure 128/75, pulse 64, temperature 97.7 F (36.5 C), temperature source Oral, resp. rate 18, height 5'  10" (1.778 m), weight 90.3 kg (199 lb), SpO2 98 %.   General appearance: alert, cooperative, appears stated age and no distress Eyes: negative Neck: no adenopathy, no carotid bruit, no JVD, supple, symmetrical, trachea midline and thyroid not enlarged, symmetric, no tenderness/mass/nodules Resp: clear to auscultation bilaterally Chest wall: no tenderness Cardio: irregularly irregular rhythm, S1:  variable, S2: normal and II/IV diastolic murmur at RUSB, distant heart sounds GI: soft, non-tender; bowel sounds normal; no masses,  no organomegaly Extremities: extremities normal, atraumatic, no cyanosis or edema Pulses: 2+ and symmetric Skin: Skin color, texture, turgor normal. No rashes or lesions Neurologic: Grossly normal  No results found for this or any previous visit (from the past 48 hour(s)). No results found.  Labs:   Lab Results  Component Value Date   WBC 4.2 04/02/2012   HGB 15.6 12/24/2015   HCT 46.0 12/24/2015   MCV 99.7 04/02/2012   PLT 161 04/02/2012   No results for input(s): NA, K, CL, CO2, BUN, CREATININE, CALCIUM, PROT, BILITOT, ALKPHOS, ALT, AST, GLUCOSE in the last 168 hours.  Invalid input(s): LABALBU  Lipid Panel  No results found for: CHOL, TRIG, HDL, CHOLHDL, VLDL, LDLCALC  BNP (last 3 results) No results for input(s): BNP in the last 8760 hours.  HEMOGLOBIN A1C No results found for: HGBA1C, MPG  Cardiac Panel (last 3 results) No results for input(s): CKTOTAL, CKMB, TROPONINI, RELINDX in the last 8760 hours.  No results found for: CKTOTAL, CKMB, CKMBINDEX, TROPONINI   TSH No results for input(s): TSH in the last 8760 hours.  EKG 01/28/2016: a.fib with controlled VR at a rate of 84 bpm, nonspecific ST and T wave abnormality. QT/QTc 342/404 ms.  Medications Prior to Admission  Medication Sig Dispense Refill  . apixaban (ELIQUIS) 5 MG TABS tablet Take 5 mg by mouth 2 (two) times daily.    . diphenhydrAMINE (BENADRYL) 25 mg capsule Take 25 mg by mouth at bedtime as needed.    Marland Kitchen. lisinopril (PRINIVIL,ZESTRIL) 5 MG tablet Take 5 mg by mouth daily.    . magnesium oxide (MAG-OX) 400 MG tablet Take 400 mg by mouth 2 (two) times daily.    . metoprolol tartrate (LOPRESSOR) 25 MG tablet Take 25 mg by mouth 2 (two) times daily.    . Multiple Vitamin (MULTIVITAMIN WITH MINERALS) TABS tablet Take 1 tablet by mouth daily after supper.    . psyllium  (METAMUCIL) 58.6 % powder Take by mouth daily after lunch. 1 heaping tablespoonful mixed in 8 oz liquid      Current Facility-Administered Medications:  .  0.9 %  sodium chloride infusion, 250 mL, Intravenous, PRN, Yates DecampJay Ganji, MD .  magnesium oxide (MAG-OX) tablet 400 mg, 400 mg, Oral, Daily PRN, Yates DecampJay Ganji, MD .  potassium chloride SA (K-DUR,KLOR-CON) CR tablet 20 mEq, 20 mEq, Oral, Daily PRN, Yates DecampJay Ganji, MD .  sodium chloride flush (NS) 0.9 % injection 3 mL, 3 mL, Intravenous, Q12H, Yates DecampJay Ganji, MD .  sodium chloride flush (NS) 0.9 % injection 3 mL, 3 mL, Intravenous, PRN, Yates DecampJay Ganji, MD  Assessment/Plan 1. Atrial fibrillation (CHA2DS2-VASc Score is 1 with yearly risk of stroke of 1.3%.  Bleeding risk is 1.88-3.2%. ASA or long-term anticoagulation appropriate.) 2. HTN 3. CKD Stage III  Recommendation: Admitted for Tikosyn initiation. First dose will be given pending potassium and magnesium results. If he does not convert to sinus rhythm, he will also need cardioversion during admission.  Erling ConteAllison,Clorene Nerio Nicole, NP-C 01/28/2016, 4:53 PM [facility] Pager: [phone] Office:  [phone]    

## 2016-01-28 NOTE — Progress Notes (Signed)
Pt arrived to unit. Hooked up to tele monitor. MD paged. EKG obtained. Lab notified for labs. Call bell and phone within reach. Wife at bedside. Will continue to monitor.

## 2016-01-28 NOTE — Progress Notes (Signed)
Pharmacy Review for Dofetilide (Tikosyn) Initiation  Admit Complaint: 75 y.o. male admitted 01/28/2016 with atrial fibrillation to be initiated on dofetilide.   Assessment:  Patient Exclusion Criteria: If any screening criteria checked as "Yes", then  patient  should NOT receive dofetilide until criteria item is corrected. If "Yes" please indicate correction plan.  YES  NO Patient  Exclusion Criteria Correction Plan  []  [x]  Baseline QTc interval is greater than or equal to 440 msec. IF above YES box checked dofetilide contraindicated unless patient has ICD; then may proceed if QTc 500-550 msec or with known ventricular conduction abnormalities may proceed with QTc 550-600 msec. QTc =   404 on 8/14   []  [x]  Magnesium level is less than 1.8 mEq/l : Last magnesium:  Lab Results  Component Value Date   MG 2.6 (H) 01/28/2016         []  [x]  Potassium level is less than 4 mEq/l : Last potassium:  Lab Results  Component Value Date   K 4.3 01/28/2016         []  [x]  Patient is known or suspected to have a digoxin level greater than 2 ng/ml: No results found for: DIGOXIN    []  [x]  Creatinine clearance less than 20 ml/min (calculated using Cockcroft-Gault, actual body weight and serum creatinine): Estimated Creatinine Clearance: 44.4 mL/min (by C-G formula based on SCr of 1.65 mg/dL).    []  [x]  Patient has received drugs known to prolong the QT intervals within the last 48 hours (phenothiazines, tricyclics or tetracyclic antidepressants, erythromycin, H-1 antihistamines, cisapride, fluoroquinolones, azithromycin). Drugs not listed above may have an, as yet, undetected potential to prolong the QT interval, updated information on QT prolonging agents is available at this website:QT prolonging agents   []  [x]  Patient received a dose of hydrochlorothiazide (Oretic) alone or in any combination including triamterene (Dyazide, Maxzide) in the last 48 hours.   []  [x]  Patient received a medication known  to increase dofetilide plasma concentrations prior to initial dofetilide dose:  . Trimethoprim (Primsol, Proloprim) in the last 36 hours . Verapamil (Calan, Verelan) in the last 36 hours or a sustained release dose in the last 72 hours . Megestrol (Megace) in the last 5 days  . Cimetidine (Tagamet) in the last 6 hours . Ketoconazole (Nizoral) in the last 24 hours . Itraconazole (Sporanox) in the last 48 hours  . Prochlorperazine (Compazine) in the last 36 hours    []  [x]  Patient is known to have a history of torsades de pointes; congenital or acquired long QT syndromes.   []  [x]  Patient has received a Class 1 antiarrhythmic with less than 2 half-lives since last dose. (Disopyramide, Quinidine, Procainamide, Lidocaine, Mexiletine, Flecainide, Propafenone)   []  [x]  Patient has received amiodarone therapy in the past 3 months or amiodarone level is greater than 0.3 ng/ml.    Patient has been appropriately anticoagulated with Eliquis 5mg  BID.  Ordering provider was confirmed at [email or web address] if they are not listed on the [facility] Authorized Prescribers list.  Goal of Therapy: Follow renal function, electrolytes, potential drug interactions, and dose adjustment. Provide education and 1 week supply at discharge.  Plan:  [x]   Physician selected initial dose within range recommended for patients level of renal function - will monitor for response.  []   Physician selected initial dose outside of range recommended for patients level of renal function - will discuss if the dose should be altered at this time.   Select One Calculated CrCl  Dose q12h  []  >  60 ml/min 500 mcg  [x]  40-60 ml/min 250 mcg  []  20-40 ml/min 125 mcg   2. Follow up QTc after the first 5 doses, renal function, electrolytes (K & Mg) daily x3 days, dose adjustment, success of initiation and facilitate 1 week discharge supply as clinically indicated.  3. Initiate Tikosyn education video (Call [phone] and ask for video #  [phone]).  4. Place Enrollment Form on the chart for discharge supply of dofetilide.   Ciara Kagan 5:57 PM 01/28/2016

## 2016-01-28 NOTE — Progress Notes (Signed)
ANTICOAGULATION CONSULT NOTE - Initial Consult  Pharmacy Consult for Eliquis Indication: atrial fibrillation  No Known Allergies  Patient Measurements: Height: 5\' 10"  (177.8 cm) Weight: 199 lb (90.3 kg) IBW/kg (Calculated) : 73   Vital Signs: Temp: 97.7 F (36.5 C) (08/14 1606) Temp Source: Oral (08/14 1606) BP: 128/75 (08/14 1606) Pulse Rate: 64 (08/14 1606)  Labs:  Recent Labs  01/28/16 1636  CREATININE 1.65*    Estimated Creatinine Clearance: 44.4 mL/min (by C-G formula based on SCr of 1.65 mg/dL).   Assessment: [age] YOM admitted for Tikosyn initiation. On Eliquis 5mg  BID PTA. Age <80, Wt >60kg, SCr 1.65. Does not meet 2 qualifiers for dose reduction.  Goal of Therapy:    Monitor platelets by anticoagulation protocol: Yes   Plan:  -continue Eliquis 5mg  BID as per home dose -CBC q72h -pharmacy to sign off and will follow peripherally  Rylinn Linzy D. Hailey Stormer, PharmD, BCPS Clinical Pharmacist Pager: [phone] 01/28/2016 6:23 PM

## 2016-01-29 ENCOUNTER — Other Ambulatory Visit: Payer: Self-pay

## 2016-01-29 LAB — MAGNESIUM: MAGNESIUM: 2.4 mg/dL (ref 1.7–2.4)

## 2016-01-29 LAB — CBC
HEMATOCRIT: 43.5 % (ref 39.0–52.0)
HEMOGLOBIN: 14.4 g/dL (ref 13.0–17.0)
MCH: 32.5 pg (ref 26.0–34.0)
MCHC: 33.1 g/dL (ref 30.0–36.0)
MCV: 98.2 fL (ref 78.0–100.0)
Platelets: 216 10*3/uL (ref 150–400)
RBC: 4.43 MIL/uL (ref 4.22–5.81)
RDW: 13.2 % (ref 11.5–15.5)
WBC: 5.8 10*3/uL (ref 4.0–10.5)

## 2016-01-29 LAB — BASIC METABOLIC PANEL
ANION GAP: 8 (ref 5–15)
BUN: 28 mg/dL — ABNORMAL HIGH (ref 6–20)
CHLORIDE: 106 mmol/L (ref 101–111)
CO2: 25 mmol/L (ref 22–32)
Calcium: 9 mg/dL (ref 8.9–10.3)
Creatinine, Ser: 1.63 mg/dL — ABNORMAL HIGH (ref 0.61–1.24)
GFR calc Af Amer: 46 mL/min — ABNORMAL LOW (ref >60)
GFR, EST NON AFRICAN AMERICAN: 40 mL/min — AB (ref >60)
Glucose, Bld: 97 mg/dL (ref 65–99)
POTASSIUM: 4.1 mmol/L (ref 3.5–5.1)
SODIUM: 139 mmol/L (ref 135–145)

## 2016-01-29 NOTE — Discharge Instructions (Signed)

## 2016-01-29 NOTE — Progress Notes (Signed)
Insurance check completed for Gregory [name]- pt will need to use Generic drug  S/W MARIE @ [facility] # [phone]   TIKOSYN 250 MCG BID AND 500 MCG BID  * NOT ON FORMULARY/ NOT COVER *   DOFETILIDE 250 MCG BID  AND  500 MCG BID   COVER- YES                  YES  CO-PAY- $144.06               $144.06  TIER- 4 DRUG                 4 DRUG  PRIOR APPROVAL - NO           NO  PHARMACY : [facility]

## 2016-01-29 NOTE — Progress Notes (Signed)
Subjective:  No specific complaints, wife present at bedside.   Objective:  Vital Signs in the last 24 hours: Temp:  [97.6 F (36.4 C)-97.8 F (36.6 C)] 97.6 F (36.4 C) (08/15 1306) Pulse Rate:  [64-95] 95 (08/15 1306) Resp:  [18] 18 (08/15 1306) BP: (105-128)/(69-77) 105/77 (08/15 1306) SpO2:  [96 %-99 %] 99 % (08/15 1306) Weight:  [90.3 kg (199 lb)] 90.3 kg (199 lb) (08/14 1606)  Physical Exam:  General appearance: alert, cooperative, appears stated age and no distress Eyes: negative Neck: no adenopathy, no carotid bruit, no JVD, supple, symmetrical, trachea midline and thyroid not enlarged, symmetric, no tenderness/mass/nodules Resp: clear to auscultation bilaterally Chest wall: no tenderness Cardio: irregularly irregular rhythm, S1: variable, S2: normal and II/IV diastolic murmur at RUSB, distant heart sounds GI: soft, non-tender; bowel sounds normal; no masses,  no organomegaly Extremities: extremities normal, atraumatic, no cyanosis or edema BMP  Recent Labs  12/24/15 0822 01/28/16 1636 01/29/16 0520  NA 142 137 139  K 4.0 4.3 4.1  CL  --  107 106  CO2  --  23 25  GLUCOSE 100* 113* 97  BUN  --  33* 28*  CREATININE  --  1.65* 1.63*  CALCIUM  --  9.2 9.0  GFRNONAA  --  39* 40*  GFRAA  --  46* 46*    CBC  Recent Labs Lab 01/29/16 0520  WBC 5.8  RBC 4.43  HGB 14.4  HCT 43.5  PLT 216  MCV 98.2  MCH 32.5  MCHC 33.1  RDW 13.2   Cardiac Studies:  EKG 01/28/2016: a.fib with controlled VR at a rate of 84 bpm, nonspecific ST and T wave abnormality. QT/QTc 342/404 ms.  EKG 01/29/2016: Atrial fibrillation with controlled ventricular response at the rate of 90 bpm, left axis deviation, normal QT interval at 344 ms.  Telemetry:  No significant arrhythmias,continued persistent atrial fibrillation.  Occasional PVCs.  Echo- 07/28/13 1. Left ventricular cavity is normal in size.   Mild concentric hypertrophy.   Normal global wall motion.   Normal systolic global  function.   Calculated EF 63%. 2. Left atrial cavity is moderate to severely dilated. 3. Right atrial cavity is mildly dilated. 4. Right ventricular cavity is mildly enlarged. 5. Mitral valve structurally normal.   Moderate mitral regurgitation.    6. Tricuspid valve structurally normal.   Mild to moderate tricuspid regurgitation.   Mild pulmonary hypertension with approx.  PA syst.  pressure of 36 mm of Hg.  Scheduled Meds: . apixaban  5 mg Oral BID  . dofetilide  250 mcg Oral BID  . lisinopril  5 mg Oral Daily  . magnesium oxide  400 mg Oral BID  . metoprolol tartrate  25 mg Oral BID  . sodium chloride flush  3 mL Intravenous Q12H   Continuous Infusions:  PRN Meds:.sodium chloride, magnesium oxide, potassium chloride, sodium chloride flush   Assessment/Plan:  1. Atrial fibrillation (CHA2DS2-VASc Score is 1 with yearly risk of stroke of 1.3%.  Bleeding risk is 1.88-3.2%. ASA or long-term anticoagulation appropriate.) 2. HTN 3. CKD Stage III  Recommendation: Patient is presently doing well and tolerating Tikosyn, has received 2 doses.  We will continue to monitor him he is also scheduled for direct current cardioversion if he does not spontaneously convert, we'll try to perform this is a tomorrow or after his fifth or sixth dose.   Yates DecampGANJI, Klaryssa Fauth, M.D. 01/29/2016, 3:08 PM [facility] Pager: [phone] Office: [phone] If no answer: [phone]

## 2016-01-29 NOTE — Care Management Note (Signed)
Case Management Note Donn PieriniKristi Olufemi Mofield RN, BSN Unit 2W-Case Manager [phone]  Patient Details  Name: Edd ArbourDouglas E Markarian MRN: [id number] Date of Birth: 03/22/1941  Subjective/Objective:  Pt admitted with afib- for TIkosyn load                  Action/Plan: PTA pt lived at home- plan to return home- referral received for [place]- insurance check submitted-  S/W MARIE @ [facility] RX # [id number]   TIKOSYN 250 MCG BID AND 500 MCG BID  * NOT ON FORMULARY/ NOT COVER *   DOFETILIDE 250 MCG BID  AND  500 MCG BID   COVER- YES                  YES  CO-PAY- $144.06               $144.06  TIER- 4 DRUG                 4 DRUG  PRIOR APPROVAL - NO           NO  [facility] : [facility]   [facility]- MD- please provide script for 7day supply-no refills to [facility]- pt will also need script with refills to take to preferred outside pharmacy- spoke with pt and wife at bedside- benefit info shared- per pt [facility] uses [facility] pharmacy which can order drug in stock with script- pt and wife also want to look into [facility] to possibly fill refill script. Info given on [facility] hours.   Expected Discharge Date:                  Expected Discharge Plan:  Home/Self Care  In-House Referral:     Discharge planning Services  CM Consult, Medication Assistance  Post Acute Care Choice:    Choice offered to:     DME Arranged:    DME Agency:     HH Arranged:    HH Agency:     Status of Service:  In process, will continue to follow  If discussed at Long Length of Stay Meetings, dates discussed:    Additional Comments:  Darrold SpanWebster, Saara Kijowski Hall, RN 01/29/2016, 9:39 AM

## 2016-01-30 ENCOUNTER — Inpatient Hospital Stay (HOSPITAL_COMMUNITY): Payer: Medicare Other | Admitting: Certified Registered"

## 2016-01-30 ENCOUNTER — Encounter (HOSPITAL_COMMUNITY): Payer: Self-pay | Admitting: *Deleted

## 2016-01-30 ENCOUNTER — Encounter (HOSPITAL_COMMUNITY)
Admission: RE | Disposition: A | Discharge status: Home / Self Care | Payer: Self-pay | Source: Ambulatory Visit | Attending: Cardiology

## 2016-01-30 ENCOUNTER — Other Ambulatory Visit: Payer: Self-pay

## 2016-01-30 ENCOUNTER — Ambulatory Visit (HOSPITAL_COMMUNITY): Admission: RE | Admit: 2016-01-30 | Payer: Medicare Other | Source: Ambulatory Visit | Admitting: Cardiology

## 2016-01-30 HISTORY — PX: CARDIOVERSION: SHX1299

## 2016-01-30 LAB — BASIC METABOLIC PANEL
Anion gap: 9 (ref 5–15)
BUN: 27 mg/dL — AB (ref 6–20)
CHLORIDE: 106 mmol/L (ref 101–111)
CO2: 25 mmol/L (ref 22–32)
Calcium: 9.2 mg/dL (ref 8.9–10.3)
Creatinine, Ser: 1.58 mg/dL — ABNORMAL HIGH (ref 0.61–1.24)
GFR calc Af Amer: 48 mL/min — ABNORMAL LOW (ref >60)
GFR calc non Af Amer: 41 mL/min — ABNORMAL LOW (ref >60)
Glucose, Bld: 93 mg/dL (ref 65–99)
POTASSIUM: 4.3 mmol/L (ref 3.5–5.1)
SODIUM: 140 mmol/L (ref 135–145)

## 2016-01-30 LAB — MAGNESIUM: MAGNESIUM: 2.4 mg/dL (ref 1.7–2.4)

## 2016-01-30 SURGERY — CARDIOVERSION
Anesthesia: General

## 2016-01-30 NOTE — Anesthesia Postprocedure Evaluation (Signed)
Anesthesia Post Note  Patient: Edd ArbourDouglas E Moser  Procedure(s) Performed: Procedure(s) (LRB): CARDIOVERSION (N/A)  Patient location during evaluation: Endoscopy Anesthesia Type: General Level of consciousness: awake, awake and alert and oriented Pain management: pain level controlled Vital Signs Assessment: post-procedure vital signs reviewed and stable Respiratory status: spontaneous breathing, nonlabored ventilation and respiratory function stable Cardiovascular status: blood pressure returned to baseline Anesthetic complications: no    Last Vitals:  Vitals:   01/30/16 1517 01/30/16 1520  BP: 99/62 105/74  Pulse: (!) 51   Resp: 18   Temp: 36.7 C     Last Pain:  Vitals:   01/30/16 1517  TempSrc: Oral                 Katlyn Muldrew COKER

## 2016-01-30 NOTE — CV Procedure (Signed)
Direct current cardioversion:  Indication symptomatic A. Fibrillation.  Procedure: Using 70 mg of IV Propofol and 20 IV Lidocaine (for reducing venous pain) for achieving deep sedation, synchronized direct current cardioversion performed. Patient was delivered with 120 Joules of electricity X 1 with success to NSR. Patient tolerated the procedure well. No immediate complication noted. Patient transiently had junctional escape with wide complex probably LBBB morphology for about 8-10 beats.

## 2016-01-30 NOTE — Transfer of Care (Signed)
Immediate Anesthesia Transfer of Care Note  Patient: Gregory [name]  Procedure(s) Performed: Procedure(s): CARDIOVERSION (N/A)  Patient Location: Endoscopy Unit  Anesthesia Type:General  Level of Consciousness: awake, oriented and patient cooperative  Airway & Oxygen Therapy: Patient Spontanous Breathing  Post-op Assessment: Report given to RN, Post -op Vital signs reviewed and stable and Patient moving all extremities  Post vital signs: Reviewed and stable  Last Vitals:  Vitals:   01/30/16 1312 01/30/16 1426  BP: 105/86 119/70  Pulse: 65 93  Resp: 20 (!) 22  Temp: 36.6 C     Last Pain:  Vitals:   01/30/16 1312  TempSrc: Oral         Complications: No apparent anesthesia complications

## 2016-01-30 NOTE — H&P (View-Only) (Signed)
Subjective:  No specific complaints, wife present at bedside.   Objective:  Vital Signs in the last 24 hours: Temp:  [97.6 F (36.4 C)-97.8 F (36.6 C)] 97.6 F (36.4 C) (08/15 1306) Pulse Rate:  [64-95] 95 (08/15 1306) Resp:  [18] 18 (08/15 1306) BP: (105-128)/(69-77) 105/77 (08/15 1306) SpO2:  [96 %-99 %] 99 % (08/15 1306) Weight:  [90.3 kg (199 lb)] 90.3 kg (199 lb) (08/14 1606)  Physical Exam:  General appearance: alert, cooperative, appears stated age and no distress Eyes: negative Neck: no adenopathy, no carotid bruit, no JVD, supple, symmetrical, trachea midline and thyroid not enlarged, symmetric, no tenderness/mass/nodules Resp: clear to auscultation bilaterally Chest wall: no tenderness Cardio: irregularly irregular rhythm, S1: variable, S2: normal and II/IV diastolic murmur at RUSB, distant heart sounds GI: soft, non-tender; bowel sounds normal; no masses,  no organomegaly Extremities: extremities normal, atraumatic, no cyanosis or edema BMP  Recent Labs  12/24/15 0822 01/28/16 1636 01/29/16 0520  NA 142 137 139  K 4.0 4.3 4.1  CL  --  107 106  CO2  --  23 25  GLUCOSE 100* 113* 97  BUN  --  33* 28*  CREATININE  --  1.65* 1.63*  CALCIUM  --  9.2 9.0  GFRNONAA  --  39* 40*  GFRAA  --  46* 46*    CBC  Recent Labs Lab 01/29/16 0520  WBC 5.8  RBC 4.43  HGB 14.4  HCT 43.5  PLT 216  MCV 98.2  MCH 32.5  MCHC 33.1  RDW 13.2   Cardiac Studies:  EKG 01/28/2016: a.fib with controlled VR at a rate of 84 bpm, nonspecific ST and T wave abnormality. QT/QTc 342/404 ms.  EKG 01/29/2016: Atrial fibrillation with controlled ventricular response at the rate of 90 bpm, left axis deviation, normal QT interval at 344 ms.  Telemetry:  No significant arrhythmias,continued persistent atrial fibrillation.  Occasional PVCs.  Echo- 07/28/13 1. Left ventricular cavity is normal in size.   Mild concentric hypertrophy.   Normal global wall motion.   Normal systolic global  function.   Calculated EF 63%. 2. Left atrial cavity is moderate to severely dilated. 3. Right atrial cavity is mildly dilated. 4. Right ventricular cavity is mildly enlarged. 5. Mitral valve structurally normal.   Moderate mitral regurgitation.    6. Tricuspid valve structurally normal.   Mild to moderate tricuspid regurgitation.   Mild pulmonary hypertension with approx.  PA syst.  pressure of 36 mm of Hg.  Scheduled Meds: . apixaban  5 mg Oral BID  . dofetilide  250 mcg Oral BID  . lisinopril  5 mg Oral Daily  . magnesium oxide  400 mg Oral BID  . metoprolol tartrate  25 mg Oral BID  . sodium chloride flush  3 mL Intravenous Q12H   Continuous Infusions:  PRN Meds:.sodium chloride, magnesium oxide, potassium chloride, sodium chloride flush   Assessment/Plan:  1. Atrial fibrillation (CHA2DS2-VASc Score is 1 with yearly risk of stroke of 1.3%.  Bleeding risk is 1.88-3.2%. ASA or long-term anticoagulation appropriate.) 2. HTN 3. CKD Stage III  Recommendation: Patient is presently doing well and tolerating Tikosyn, has received 2 doses.  We will continue to monitor him he is also scheduled for direct current cardioversion if he does not spontaneously convert, we'll try to perform this is a tomorrow or after his fifth or sixth dose.   Yates DecampGANJI, Suella Cogar, M.D. 01/29/2016, 3:08 PM [facility] Pager: [phone] Office: [phone] If no answer: [phone]

## 2016-01-30 NOTE — Anesthesia Preprocedure Evaluation (Addendum)
Anesthesia Evaluation  Patient identified by MRN, date of birth, ID band Patient awake    Reviewed: Allergy & Precautions, NPO status , Patient's Chart, lab work & pertinent test results, reviewed documented beta blocker date and time , Unable to perform ROS - Chart review only  Airway Mallampati: I  TM Distance: >3 FB Neck ROM: Full    Dental  (+) Teeth Intact, Dental Advisory Given   Pulmonary    breath sounds clear to auscultation       Cardiovascular hypertension, Pt. on medications and Pt. on home beta blockers  Rhythm:Irregular Rate:Normal     Neuro/Psych    GI/Hepatic   Endo/Other    Renal/GU      Musculoskeletal   Abdominal   Peds  Hematology   Anesthesia Other Findings   Reproductive/Obstetrics                            Anesthesia Physical Anesthesia Plan  ASA: III  Anesthesia Plan: General   Post-op Pain Management:    Induction: Intravenous  Airway Management Planned: Mask  Additional Equipment: None  Intra-op Plan:   Post-operative Plan: Extubation in OR  Informed Consent: I have reviewed the patients History and Physical, chart, labs and discussed the procedure including the risks, benefits and alternatives for the proposed anesthesia with the patient or authorized representative who has indicated his/her understanding and acceptance.   Dental advisory given  Plan Discussed with: CRNA, Anesthesiologist and Surgeon  Anesthesia Plan Comments:         Anesthesia Quick Evaluation

## 2016-01-30 NOTE — Care Management Important Message (Signed)
Important Message  Patient Details  Name: Gregory [name] MRN: [id number] Date of Birth: [birth year]   Medicare Important Message Given:  Yes    Bernadette HoitShoffner, Lucius Wise Coleman 01/30/2016, 11:44 AM

## 2016-01-30 NOTE — Interval H&P Note (Signed)
History and Physical Interval Note:  01/30/2016 3:01 PM  Edd ArbourDouglas E Franckowiak  has presented today for surgery, with the diagnosis of AFIB  The various methods of treatment have been discussed with the patient and family. After consideration of risks, benefits and other options for treatment, the patient has consented to  Procedure(s): CARDIOVERSION (N/A) as a surgical intervention .  The patient's history has been reviewed, patient examined, no change in status, stable for surgery.  I have reviewed the patient's chart and labs.  Questions were answered to the patient's satisfaction.     Yates DecampGANJI, Zaccheaus Storlie

## 2016-01-31 ENCOUNTER — Other Ambulatory Visit: Payer: Self-pay

## 2016-01-31 LAB — BASIC METABOLIC PANEL
Anion gap: 7 (ref 5–15)
BUN: 25 mg/dL — ABNORMAL HIGH (ref 6–20)
CHLORIDE: 105 mmol/L (ref 101–111)
CO2: 26 mmol/L (ref 22–32)
CREATININE: 1.71 mg/dL — AB (ref 0.61–1.24)
Calcium: 8.8 mg/dL — ABNORMAL LOW (ref 8.9–10.3)
GFR calc non Af Amer: 38 mL/min — ABNORMAL LOW (ref >60)
GFR, EST AFRICAN AMERICAN: 44 mL/min — AB (ref >60)
GLUCOSE: 94 mg/dL (ref 65–99)
Potassium: 4.3 mmol/L (ref 3.5–5.1)
Sodium: 138 mmol/L (ref 135–145)

## 2016-01-31 LAB — MAGNESIUM: Magnesium: 2.4 mg/dL (ref 1.7–2.4)

## 2016-01-31 MED ORDER — DOFETILIDE 250 MCG PO CAPS: 250.0000 ug | ORAL_CAPSULE | Freq: Two times a day (BID) | ORAL | 1 refills | Status: DC

## 2016-01-31 MED ORDER — DOFETILIDE 250 MCG PO CAPS: 250.0000 ug | ORAL_CAPSULE | Freq: Two times a day (BID) | ORAL | 0 refills | Status: DC

## 2016-01-31 NOTE — Discharge Summary (Signed)
Physician Discharge Summary  Patient ID: Gregory [name] MRN: [id number] DOB/AGE: [birth year] 75 y.o.  Admit date: 01/28/2016 Discharge date: 01/31/2016  Discharge Diagnoses: 1. Atrial fibrillation (CHA2DS2-VASc Score is 1 with yearly risk of stroke of 1.3%.  Bleeding risk is 1.88-3.2%. ASA or long-term anticoagulation appropriate.) 2. HTN 3. CKD Stage III  Hospital Course: Gregory [name]  is a 75 y.o. male with a history of paroxysmal atrial fibrillation. He has history of multiple cardioversions (12/21/2015, 09/05/2014, 06/30/2014, 10/28/2013, 06/08/2012 (on Flecainide), 05/03/2012), A. Fib to SR.  After his last cardioversion on 12/21/2015, he went [place], states thathe went into atrial fibrillation while he was traveling. He has noticed mild worsening dyspnea and fatigue since being in A. Fib, but he was able to complete the trip. No GI bleed, no dark stools. His other history includes stage III chronic kidney disease. There is no history of hypertension or diabetes mellitus. Flecainide was discontinued and Tikosyn was recommended. Amiodarone or sotalol are not an option due to bradycardia and chronic renal failure. Diltiazem was also changed to metoprolol.   He was admitted on 01/28/2016 for Tikosyn initiation. He underwent DCCV on 01/30/2016 after he did not convert with Tikosyn alone and had a brief episode of asymptomatic NSVT. He was monitored through the night and after 6th dose of Tikosyn with no recurrence of NSVT and maintenance of sinus rhythm.   Recommendations on discharge: Follow up outpatient as scheduled. Continue Mg supplement to maintain Mg at or above 2.0.   Discharge Exam: Blood pressure 104/66, pulse 71, temperature 98.6 F (37 C), temperature source Oral, resp. rate 18, height 5\' 10"  (1.778 m), weight 90.3 kg (199 lb), SpO2 98 %.    General appearance: alert, cooperative, appears stated age and no distress Eyes: negative Neck:  no adenopathy, no carotid bruit, no JVD, supple, symmetrical, trachea midline and thyroid not enlarged, symmetric, no tenderness/mass/nodules Resp: clear to auscultation bilaterally Chest wall: no tenderness Cardio: regular rhythm, S1, S2: normal and II/IV diastolic murmur at RUSB, distant heart sounds GI: soft, non-tender; bowel sounds normal; no masses,  no organomegaly Extremities: extremities normal, atraumatic, no cyanosis or edema Pulses: 2+ and symmetric Skin: Skin color, texture, turgor normal. No rashes or lesions Neurologic: Grossly normal  Labs:   Lab Results  Component Value Date   WBC 5.8 01/29/2016   HGB 14.4 01/29/2016   HCT 43.5 01/29/2016   MCV 98.2 01/29/2016   PLT 216 01/29/2016    Recent Labs Lab 01/31/16 0028  NA 138  K 4.3  CL 105  CO2 26  BUN 25*  CREATININE 1.71*  CALCIUM 8.8*  GLUCOSE 94    Lipid Panel  No results found for: CHOL, TRIG, HDL, CHOLHDL, VLDL, LDLCALC  BNP (last 3 results) No results for input(s): BNP in the last 8760 hours.  HEMOGLOBIN A1C No results found for: HGBA1C, MPG  Cardiac Panel (last 3 results) No results for input(s): CKTOTAL, CKMB, TROPONINI, RELINDX in the last 8760 hours.  No results found for: CKTOTAL, CKMB, CKMBINDEX, TROPONINI   TSH No results for input(s): TSH in the last 8760 hours.  EKG 01/31/2016: Sinus bradycardia with first-degree AV block at a rate of 57 bpm, no evidence of ischemia.  QT/QTc 412/401 ms.  EKG 01/28/2016: a.fib with controlled VR at a rate of 84 bpm, nonspecific ST and T wave abnormality. QT/QTc 342/404 ms.  Radiology: No results found.    FOLLOW UP PLANS AND APPOINTMENTS  Medication List    TAKE these medications   apixaban 5 MG Tabs tablet Commonly known as:  ELIQUIS Take 5 mg by mouth 2 (two) times daily.   diphenhydrAMINE 25 mg capsule Commonly known as:  BENADRYL Take 25 mg by mouth at bedtime as needed for allergies or sleep.   dofetilide 250 MCG  capsule Commonly known as:  TIKOSYN Take 1 capsule (250 mcg total) by mouth 2 (two) times daily.   ibuprofen 200 MG tablet Commonly known as:  ADVIL,MOTRIN Take 200-400 mg by mouth every 6 (six) hours as needed for mild pain or moderate pain.   KERASAL FUNGAL NAIL RENEWAL Liqd Apply 1 application topically daily as needed (to affected nails).   lisinopril 5 MG tablet Commonly known as:  PRINIVIL,ZESTRIL Take 5 mg by mouth daily.   magnesium oxide 400 MG tablet Commonly known as:  MAG-OX Take 400 mg by mouth 2 (two) times daily.   metoprolol tartrate 25 MG tablet Commonly known as:  LOPRESSOR Take 25 mg by mouth 2 (two) times daily.   psyllium 58.6 % powder Commonly known as:  METAMUCIL Take by mouth daily after lunch. 1 heaping tablespoonful mixed in 8 oz liquid        Follow-up Information    Yates DecampGANJI, JAY, MD .   Specialty:  Cardiology Why:  as scheduled Contact information: [address] [place] [address] [phone]           Erling Contellison,Beryle Bagsby Nicole, NP-C 01/31/2016, 8:37 AM [facility] Pager: [phone] Office: [phone]

## 2016-01-31 NOTE — Care Management Note (Signed)
Case Management Note [name] RN, BSN Unit 2W-Case Manager [phone]  Patient Details  Name: Gregory ArbourDouglas E [name] MRN: [id number] Date of Birth: 11/01/1940  Subjective/Objective:  Pt admitted with afib- for TIkosyn load                  Action/Plan: PTA pt lived at home- plan to return home- referral received for [place] assistance- insurance check submitted-  S/W MARIE @ [facility] RX # [phone]   TIKOSYN 250 MCG BID AND 500 MCG BID  * NOT ON FORMULARY/ NOT COVER *   DOFETILIDE 250 MCG BID  AND  500 MCG BID   COVER- YES                  YES  CO-PAY- $144.06               $144.06  TIER- 4 DRUG                 4 DRUG  PRIOR APPROVAL - NO           NO  [facility]   Pt [facility] day [facility]- MD- please provide script for 7day supply-no refills to send to [facility]- pt will also need script with refills to take to preferred outside pharmacy- spoke with pt and wife at bedside- benefit info shared- per pt he uses [facility] which can order drug in stock with script- pt and wife also want to look [facility] to possibly fill refill script. Info given on [facility] hours.   Expected Discharge Date:     01/31/16             Expected Discharge Plan:  Home/Self Care  In-House Referral:     Discharge planning Services  CM Consult, Medication Assistance  Post Acute Care Choice:    Choice offered to:     DME Arranged:    DME Agency:     HH Arranged:    HH Agency:     Status of Service:  Completed, signed off  If discussed at [employer] Length of Stay Meetings, dates discussed:    Additional Comments:  01/31/16- 0930- Donn PieriniKristi [name] RN CM- pt for d/c home today- [facility] day [facility]- No further CM needs noted.   Darrold SpanWebster, [name], RN 01/31/2016, 10:05 AM

## 2016-02-07 ENCOUNTER — Encounter (HOSPITAL_COMMUNITY): Payer: Self-pay | Admitting: *Deleted

## 2016-02-07 ENCOUNTER — Inpatient Hospital Stay (HOSPITAL_COMMUNITY)
Admission: AD | Admit: 2016-02-07 | Discharge: 2016-02-09 | DRG: 310 | Disposition: A | Discharge status: Home / Self Care | Payer: Medicare Other | Source: Ambulatory Visit | Attending: Cardiology | Admitting: Cardiology

## 2016-02-07 ENCOUNTER — Other Ambulatory Visit: Payer: Self-pay

## 2016-02-07 DIAGNOSIS — N183 Chronic kidney disease, stage 3 (moderate): Secondary | ICD-10-CM | POA: Diagnosis present

## 2016-02-07 DIAGNOSIS — Z9841 Cataract extraction status, right eye: Secondary | ICD-10-CM

## 2016-02-07 DIAGNOSIS — I4891 Unspecified atrial fibrillation: Secondary | ICD-10-CM | POA: Diagnosis present

## 2016-02-07 DIAGNOSIS — Z5181 Encounter for therapeutic drug level monitoring: Secondary | ICD-10-CM | POA: Diagnosis not present

## 2016-02-07 DIAGNOSIS — I129 Hypertensive chronic kidney disease with stage 1 through stage 4 chronic kidney disease, or unspecified chronic kidney disease: Secondary | ICD-10-CM | POA: Diagnosis present

## 2016-02-07 DIAGNOSIS — Z961 Presence of intraocular lens: Secondary | ICD-10-CM | POA: Diagnosis present

## 2016-02-07 DIAGNOSIS — R001 Bradycardia, unspecified: Secondary | ICD-10-CM | POA: Diagnosis not present

## 2016-02-07 DIAGNOSIS — I480 Paroxysmal atrial fibrillation: Principal | ICD-10-CM | POA: Diagnosis present

## 2016-02-07 DIAGNOSIS — Z9842 Cataract extraction status, left eye: Secondary | ICD-10-CM | POA: Diagnosis not present

## 2016-02-07 DIAGNOSIS — Z7901 Long term (current) use of anticoagulants: Secondary | ICD-10-CM | POA: Diagnosis not present

## 2016-02-07 DIAGNOSIS — I481 Persistent atrial fibrillation: Secondary | ICD-10-CM | POA: Diagnosis present

## 2016-02-07 LAB — BASIC METABOLIC PANEL
Anion gap: 5 (ref 5–15)
BUN: 26 mg/dL — ABNORMAL HIGH (ref 6–20)
CALCIUM: 9 mg/dL (ref 8.9–10.3)
CHLORIDE: 109 mmol/L (ref 101–111)
CO2: 26 mmol/L (ref 22–32)
CREATININE: 1.72 mg/dL — AB (ref 0.61–1.24)
GFR calc Af Amer: 43 mL/min — ABNORMAL LOW (ref >60)
GFR calc non Af Amer: 37 mL/min — ABNORMAL LOW (ref >60)
GLUCOSE: 89 mg/dL (ref 65–99)
Potassium: 4.5 mmol/L (ref 3.5–5.1)
Sodium: 140 mmol/L (ref 135–145)

## 2016-02-07 LAB — MAGNESIUM: Magnesium: 2.4 mg/dL (ref 1.7–2.4)

## 2016-02-07 MED ORDER — APIXABAN 5 MG PO TABS
5.0000 mg | ORAL_TABLET | Freq: Two times a day (BID) | ORAL | Status: DC
  Administered 2016-02-07 – 2016-02-09 (×4): 5 mg via ORAL
  Filled 2016-02-07 (×4): qty 1

## 2016-02-07 MED ORDER — MAGNESIUM OXIDE 400 (241.3 MG) MG PO TABS
400.0000 mg | ORAL_TABLET | Freq: Two times a day (BID) | ORAL | Status: DC
  Administered 2016-02-07 – 2016-02-09 (×4): 400 mg via ORAL
  Filled 2016-02-07 (×4): qty 1

## 2016-02-07 MED ORDER — LISINOPRIL 5 MG PO TABS
5.0000 mg | ORAL_TABLET | Freq: Every day | ORAL | Status: DC
Start: 2016-02-07 — End: 2016-02-09
  Administered 2016-02-08 – 2016-02-09 (×2): 5 mg via ORAL
  Filled 2016-02-07 (×2): qty 1

## 2016-02-07 MED ORDER — ACETAMINOPHEN 325 MG PO TABS: 650.0000 mg | ORAL_TABLET | ORAL | Status: DC | PRN

## 2016-02-07 MED ORDER — METOPROLOL TARTRATE 25 MG PO TABS
25.0000 mg | ORAL_TABLET | Freq: Two times a day (BID) | ORAL | Status: DC
  Administered 2016-02-07 – 2016-02-08 (×2): 25 mg via ORAL
  Filled 2016-02-07 (×3): qty 1

## 2016-02-07 MED ORDER — DOFETILIDE 250 MCG PO CAPS
375.0000 ug | ORAL_CAPSULE | Freq: Two times a day (BID) | ORAL | Status: DC
  Administered 2016-02-07 – 2016-02-09 (×4): 375 ug via ORAL
  Filled 2016-02-07 (×4): qty 1

## 2016-02-07 MED ORDER — ONDANSETRON HCL 4 MG/2ML IJ SOLN
4.0000 mg | Freq: Four times a day (QID) | INTRAMUSCULAR | Status: DC | PRN
Start: 2016-02-07 — End: 2016-02-09

## 2016-02-07 MED ORDER — DOFETILIDE 125 MCG PO CAPS
125.0000 ug | ORAL_CAPSULE | Freq: Once | ORAL | Status: AC
  Administered 2016-02-07: 125 ug via ORAL
  Filled 2016-02-07: qty 1

## 2016-02-07 NOTE — H&P (Signed)
Gregory [name] is an 75 y.o. male.   Chief Complaint: Atrial fibrillation, Tikosyn initiation [name]  is a 75 y.o. male with a history of paroxysmal atrial fibrillation. He has history of multiple cardioversions (12/21/2015, 09/05/2014, 06/30/2014, 10/28/2013, 06/08/2012 (on Flecainide), 05/03/2012), A. Fib to SR.  After his last cardioversion on 12/21/2015, he went on a trip to [place] for 3 weeks, states that he went into atrial fibrillation while he was traveling. He had noticed mild worsening dyspnea and fatigue since being in A. Fib, but he was able to complete the trip. No GI bleed, no dark stools. His other history includes stage III chronic kidney disease. There is no history of hypertension or diabetes mellitus. Flecainide was discontinued and Tikosyn was recommended. Amiodarone or sotalol are not an option due to bradycardia and chronic renal failure. Diltiazem was also changed to metoprolol.  He was admitted on 01/28/2016 for Tikosyn initiation. He underwent DCCV on 01/30/2016 after he did not convert with Tikosyn alone and had a brief episode of asymptomatic NSVT. He was monitored through the night and after 6th dose of Tikosyn with no recurrence of NSVT and maintenance of sinus rhythm. He again called the office on 02/06/2016 stating he had gone back into a.fib. Presented on the morning of 02/07/2016 with EKG revealing A.fib with RVR and was readmitted for Tikosyn uptitration.   Past Medical History:  Diagnosis Date  . Arthritis    "right knee" (01/28/2016)  . CAP (community acquired pneumonia) 2013  . Chronic atrial fibrillation (HCC)   . Diverticulosis of colon (without mention of hemorrhage)   . Dysrhythmia   . High blood pressure   . Internal hemorrhoids without mention of complication   . Personal history of colonic polyps 01/17/2002   adenomatous, tublar adenoma 02/10/2007    Past Surgical History:  Procedure Laterality Date  . CARDIOVERSION  05/03/2012    Procedure: CARDIOVERSION;  Surgeon: [name], MD;  Location: [facility] ENDOSCOPY;  Service: Cardiovascular;  Laterality: N/A;  . CARDIOVERSION  06/08/2012   Procedure: CARDIOVERSION;  Surgeon: [name], MD;  Location: [facility] ENDOSCOPY;  Service: Cardiovascular;  Laterality: N/A;  Lawrence Marseilles /janie  . CARDIOVERSION N/A 10/28/2013   Procedure: CARDIOVERSION;  Surgeon: [name], MD;  Location: [facility] ENDOSCOPY;  Service: Cardiovascular;  Laterality: N/A;  . CARDIOVERSION N/A 06/30/2014   Procedure: CARDIOVERSION;  Surgeon: [name], MD;  Location: [facility] ENDOSCOPY;  Service: Cardiovascular;  Laterality: N/A;  H&P in file  . CARDIOVERSION N/A 09/05/2014   Procedure: CARDIOVERSION;  Surgeon: [name], MD;  Location: [facility] ENDOSCOPY;  Service: Cardiovascular;  Laterality: N/A;  . CARDIOVERSION N/A 12/24/2015   Procedure: CARDIOVERSION;  Surgeon: [name], MD;  Location: [facility] ENDOSCOPY;  Service: Cardiovascular;  Laterality: N/A;  . CARDIOVERSION N/A 01/30/2016   Procedure: CARDIOVERSION;  Surgeon: [name], MD;  Location: [facility] ENDOSCOPY;  Service: Cardiovascular;  Laterality: N/A;  . CATARACT EXTRACTION W/ INTRAOCULAR LENS  IMPLANT, BILATERAL Bilateral   . COLONOSCOPY    . INGUINAL HERNIA REPAIR Bilateral    as child  . KNEE ARTHROSCOPY Right 2004    Family History  Problem Relation Age of Onset  . Heart disease Father   . Stroke Father   . Cancer Mother     bladder  . Colon cancer Neg Hx   . Stomach cancer Neg Hx    Social History:  reports that he has never smoked. He has never used smokeless tobacco. He reports that  he drinks about 4.2 oz of alcohol per week . He reports that he does not use drugs.  Allergies: No Known Allergies  Review of Systems - History obtained from the patient General ROS: positive for  - fatigue negative for - malaise, night sweats, weight gain or weight loss Respiratory ROS: positive for - shortness of breath negative for - cough, orthopnea or  wheezing Cardiovascular ROS: positive for - dyspnea on exertion and irregular heartbeat negative for - chest pain, edema, orthopnea or paroxysmal nocturnal dyspnea Neurological ROS: no TIA or stroke symptoms  Blood pressure (!) 120/97, temperature 97.8 F (36.6 C), temperature source Oral, resp. rate 18, height 5\' 11"  (1.803 m), weight 90.7 kg (200 lb), SpO2 95 %.   General appearance: alert, cooperative, appears stated age and no distress Eyes: negative Neck: no adenopathy, no carotid bruit, no JVD, supple, symmetrical, trachea midline and thyroid not enlarged, symmetric, no tenderness/mass/nodules Resp: clear to auscultation bilaterally Chest wall: no tenderness Cardio: irregularly irregular rhythm, S1: variable, S2: normal and II/IV diastolic murmur at RUSB, distant heart sounds GI: soft, non-tender; bowel sounds normal; no masses,  no organomegaly Extremities: extremities normal, atraumatic, no cyanosis or edema Pulses: 2+ and symmetric Skin: Skin color, texture, turgor normal. No rashes or lesions Neurologic: Grossly normal  No results found for this or any previous visit (from the past 48 hour(s)). No results found.  Labs:   Lab Results  Component Value Date   WBC 5.8 01/29/2016   HGB 14.4 01/29/2016   HCT 43.5 01/29/2016   MCV 98.2 01/29/2016   PLT 216 01/29/2016   No results for input(s): NA, K, CL, CO2, BUN, CREATININE, CALCIUM, PROT, BILITOT, ALKPHOS, ALT, AST, GLUCOSE in the last 168 hours.  Invalid input(s): LABALBU  Lipid Panel  No results found for: CHOL, TRIG, HDL, CHOLHDL, VLDL, LDLCALC  BNP (last 3 results) No results for input(s): BNP in the last 8760 hours.  HEMOGLOBIN A1C No results found for: HGBA1C, MPG  Cardiac Panel (last 3 results) No results for input(s): CKTOTAL, CKMB, TROPONINI, RELINDX in the last 8760 hours.  No results found for: CKTOTAL, CKMB, CKMBINDEX, TROPONINI   TSH No results for input(s): TSH in the last 8760  hours.  Outpatient EKG 02/07/2016: Atrial fibrillation with RVR at a rate of 116 bpm, normal axis, nonspecific T-wave abnormality, labeled his complexes.  Compared to EKG on 01/24/2016, rate has increased, otherwise no significant change. QT/QTc 313/43537ms.  Medications Prior to Admission  Medication Sig Dispense Refill  . apixaban (ELIQUIS) 5 MG TABS tablet Take 5 mg by mouth 2 (two) times daily.    . Dermatological Products, Misc. (KERASAL FUNGAL NAIL RENEWAL) LIQD Apply 1 application topically daily as needed (to affected nails).    . diphenhydrAMINE (BENADRYL) 25 mg capsule Take 25 mg by mouth at bedtime as needed for allergies or sleep.     Marland Kitchen. dofetilide (TIKOSYN) 250 MCG capsule Take 1 capsule (250 mcg total) by mouth 2 (two) times daily. 14 capsule 0  . ibuprofen (ADVIL,MOTRIN) 200 MG tablet Take 200-400 mg by mouth every 6 (six) hours as needed for mild pain or moderate pain.    Marland Kitchen. lisinopril (PRINIVIL,ZESTRIL) 5 MG tablet Take 5 mg by mouth daily.    . magnesium oxide (MAG-OX) 400 MG tablet Take 400 mg by mouth 2 (two) times daily.    . metoprolol tartrate (LOPRESSOR) 25 MG tablet Take 25 mg by mouth 2 (two) times daily.    . psyllium (METAMUCIL) 58.6 % powder  Take by mouth daily after lunch. 1 heaping tablespoonful mixed in 8 oz liquid      Current Facility-Administered Medications:  .  acetaminophen (TYLENOL) tablet 650 mg, 650 mg, Oral, Q4H PRN, Marcy SalvoBridgette Vennesa Bastedo, NP .  apixaban (ELIQUIS) tablet 5 mg, 5 mg, Oral, BID, Marcy SalvoBridgette Arthi Mcdonald, NP .  lisinopril (PRINIVIL,ZESTRIL) tablet 5 mg, 5 mg, Oral, Daily, Marcy SalvoBridgette Yordy Matton, NP .  magnesium oxide (MAG-OX) tablet 400 mg, 400 mg, Oral, BID, Marcy SalvoBridgette Akasia Ahmad, NP .  metoprolol tartrate (LOPRESSOR) tablet 25 mg, 25 mg, Oral, BID, Marcy SalvoBridgette Violette Morneault, NP .  ondansetron (ZOFRAN) injection 4 mg, 4 mg, Intravenous, Q6H PRN, Marcy SalvoBridgette Winthrop Shannahan, NP  Assessment/Plan 1. Atrial fibrillation (CHA2DS2-VASc Score is 1 with yearly risk of stroke of 1.3%.   Bleeding risk is 1.88-3.2%. ASA or long-term anticoagulation appropriate.) 2. HTN 3. CKD Stage III  Recommendation: Admitted for Tikosyn up-titration due to recurrence of atrial fibrillation. If he does not convert to sinus rhythm, he will also need cardioversion during admission.  Erling Contellison,Carle Fenech Nicole, NP-C 02/07/2016, 9:41 AM [facility]. [facility] Pager: [phone] Office: [phone]

## 2016-02-07 NOTE — Discharge Instructions (Signed)

## 2016-02-07 NOTE — Progress Notes (Signed)
Pharmacy Consult:  Tikosyn up-titration  75 yom previously admitted earlier this month for Tikosyn initiation and discharged on 250mcg BID dose. Now Pharmacy consulted to assist in up-titration of Tikosyn with patient back in afib. Per discussion with Dr. Jacinto HalimGanji, will increase dose to 375mcg BID for now and monitor response. CrCl based on TBW is ~48. K 4.5, Mag 2.4, Qtc 398. Anticoagulated appropriately with apixaban. No interacting medications noted.  Patient reports he took 250mcg Tikosyn dose already this AM.  Plan: Additional Tikosyn 125mcg dose this AM (to equal 375mcg total dose) Tikosyn 375mcg PO BID - to start tonight Monitor electrolytes, QTc   Babs BertinHaley Franci Oshana, PharmD, [facility] CampusBCPS Clinical Pharmacist Pager [phone] 02/07/2016 10:16 AM

## 2016-02-08 ENCOUNTER — Encounter (HOSPITAL_COMMUNITY)
Admission: AD | Disposition: A | Discharge status: Home / Self Care | Payer: Self-pay | Source: Ambulatory Visit | Attending: Cardiology

## 2016-02-08 ENCOUNTER — Encounter (HOSPITAL_COMMUNITY): Payer: Self-pay | Admitting: *Deleted

## 2016-02-08 ENCOUNTER — Inpatient Hospital Stay (HOSPITAL_COMMUNITY): Payer: Medicare Other | Admitting: Anesthesiology

## 2016-02-08 HISTORY — PX: CARDIOVERSION: SHX1299

## 2016-02-08 LAB — BASIC METABOLIC PANEL
ANION GAP: 7 (ref 5–15)
BUN: 20 mg/dL (ref 6–20)
CALCIUM: 9.1 mg/dL (ref 8.9–10.3)
CO2: 24 mmol/L (ref 22–32)
CREATININE: 1.42 mg/dL — AB (ref 0.61–1.24)
Chloride: 108 mmol/L (ref 101–111)
GFR calc Af Amer: 55 mL/min — ABNORMAL LOW (ref >60)
GFR calc non Af Amer: 47 mL/min — ABNORMAL LOW (ref >60)
GLUCOSE: 102 mg/dL — AB (ref 65–99)
Potassium: 4.1 mmol/L (ref 3.5–5.1)
Sodium: 139 mmol/L (ref 135–145)

## 2016-02-08 LAB — BRAIN NATRIURETIC PEPTIDE: B NATRIURETIC PEPTIDE 5: 402.2 pg/mL — AB (ref 0.0–100.0)

## 2016-02-08 LAB — MAGNESIUM: MAGNESIUM: 2.3 mg/dL (ref 1.7–2.4)

## 2016-02-08 SURGERY — CARDIOVERSION
Anesthesia: General

## 2016-02-08 MED ORDER — SODIUM CHLORIDE 0.9% FLUSH
3.0000 mL | Freq: Two times a day (BID) | INTRAVENOUS | Status: DC
  Administered 2016-02-08 – 2016-02-09 (×2): 3 mL via INTRAVENOUS

## 2016-02-08 MED ORDER — SODIUM CHLORIDE 0.9 % IV SOLN
250.0000 mL | INTRAVENOUS | Status: DC
  Administered 2016-02-08: 250 mL via INTRAVENOUS

## 2016-02-08 MED ORDER — PROPOFOL 10 MG/ML IV BOLUS
INTRAVENOUS | Status: DC | PRN
  Administered 2016-02-08: 80 mg via INTRAVENOUS

## 2016-02-08 MED ORDER — LIDOCAINE 2% (20 MG/ML) 5 ML SYRINGE
INTRAMUSCULAR | Status: DC | PRN
  Administered 2016-02-08: 80 mg via INTRAVENOUS

## 2016-02-08 MED ORDER — SODIUM CHLORIDE 0.9% FLUSH: 3.0000 mL | INTRAVENOUS | Status: DC | PRN

## 2016-02-08 NOTE — Progress Notes (Signed)
Subjective:  No chest pain, no shortness of breath. Mild palpitations.  Objective:  Vital Signs in the last 24 hours: Temp:  [97.9 F (36.6 C)-98.1 F (36.7 C)] 97.9 F (36.6 C) (08/25 1337) Pulse Rate:  [81] 81 (08/24 2118) Resp:  [18-23] 23 (08/25 1337) BP: (119-129)/(79-90) 129/90 (08/25 1337) SpO2:  [95 %-98 %] 98 % (08/25 0500) Weight:  [88 kg (194 lb)] 88 kg (194 lb) (08/25 0500)  Intake/Output from previous day: 08/24 0701 - 08/25 0700 In: 240 [P.O.:240] Out: 250 [Urine:250]  Physical Exam:  General appearance: alert, cooperative and appears stated age Eyes: negative findings: lids and lashes normal Neck: no adenopathy, no carotid bruit, no JVD, supple, symmetrical, trachea midline and thyroid not enlarged, symmetric, no tenderness/mass/nodules Neck: JVP - normal, carotids 2+= without bruits Resp: clear to auscultation bilaterally Chest wall: no tenderness Cardio: irregularly irregular rhythm and S1 is stable, S2 is normal, 2/6 systolic ejection murmur. GI: soft, non-tender; bowel sounds normal; no masses,  no organomegaly Extremities: extremities normal, atraumatic, no cyanosis or edema    Lab Results: BMP  Recent Labs  01/31/16 0028 02/07/16 1038 02/08/16 0731  NA 138 140 139  K 4.3 4.5 4.1  CL 105 109 108  CO2 26 26 24   GLUCOSE 94 89 102*  BUN 25* 26* 20  CREATININE 1.71* 1.72* 1.42*  CALCIUM 8.8* 9.0 9.1  GFRNONAA 38* 37* 47*  GFRAA 44* 43* 55*    EKG: unchanged from previous tracings, atrial fibrillation, rate uncontrolled and non specirfic ST-T change. Normal QT.   Assessment/Plan:  Symptomatic paroxysmal atrial fibrillation with rapid ventricle response Hypertension Chronic stage IIIa kidney disease Therapeutic drug monitoring, patient admitted for Tikosyn titration.  Recommendation: Patient in persistent A. fib with RVR, will proceed with direct current cardio version today and continue Tikosyn administration. Labs are stable.   Yates DecampGANJI,  Devere Brem, M.D. 02/08/2016, 1:52 PM [facility] Pager: [phone] Office: [phone] If no answer: [phone]

## 2016-02-08 NOTE — CV Procedure (Signed)
Direct current cardioversion:  Indication symptomatic A. Fibrillation.  Procedure: Using 80 mg of IV Propofol and 80 IV Lidocaine (for reducing venous pain) for achieving deep sedation, synchronized direct current cardioversion performed. Patient was delivered with 120 Joules of electricity X 1 with success to S. Bradycardia. Patient tolerated the procedure well. No immediate complication noted.

## 2016-02-08 NOTE — Anesthesia Preprocedure Evaluation (Addendum)
Anesthesia Evaluation  Patient identified by MRN, date of birth, ID band Patient awake    Reviewed: Allergy & Precautions, NPO status , Patient's Chart, lab work & pertinent test results  History of Anesthesia Complications Negative for: history of anesthetic complications  Airway Mallampati: I  TM Distance: >3 FB Neck ROM: Full    Dental no notable dental hx. (+) Teeth Intact   Pulmonary    Pulmonary exam normal        Cardiovascular hypertension, Pt. on medications and Pt. on home beta blockers Normal cardiovascular exam+ dysrhythmias Atrial Fibrillation      Neuro/Psych    GI/Hepatic   Endo/Other    Renal/GU Renal InsufficiencyRenal disease     Musculoskeletal  (+) Arthritis ,   Abdominal   Peds  Hematology   Anesthesia Other Findings   Reproductive/Obstetrics                            Anesthesia Physical Anesthesia Plan  ASA: II  Anesthesia Plan: MAC   Post-op Pain Management:    Induction: Intravenous  Airway Management Planned: Natural Airway  Additional Equipment:   Intra-op Plan:   Post-operative Plan:   Informed Consent: I have reviewed the patients History and Physical, chart, labs and discussed the procedure including the risks, benefits and alternatives for the proposed anesthesia with the patient or authorized representative who has indicated his/her understanding and acceptance.   Dental advisory given  Plan Discussed with: Anesthesiologist and Surgeon  Anesthesia Plan Comments:        Anesthesia Quick Evaluation

## 2016-02-08 NOTE — Transfer of Care (Signed)
Immediate Anesthesia Transfer of Care Note  Patient: Gregory [name]  Procedure(s) Performed: Procedure(s): CARDIOVERSION (N/A)  Patient Location: Endoscopy Unit  Anesthesia Type:General  Level of Consciousness: awake, alert  and oriented  Airway & Oxygen Therapy: Patient Spontanous Breathing and Patient connected to nasal cannula oxygen  Post-op Assessment: Report given to RN and Post -op Vital signs reviewed and stable  Post vital signs: Reviewed and stable  Last Vitals:  Vitals:   02/08/16 0500 02/08/16 1337  BP: 122/79 129/90  Pulse:    Resp: 18 (!) 23  Temp: 36.7 C 36.6 C    Last Pain:  Vitals:   02/08/16 1337  TempSrc: Oral  PainSc:       Patients Stated Pain Goal: 0 (02/08/16 0717)  Complications: No apparent anesthesia complications

## 2016-02-08 NOTE — Interval H&P Note (Signed)
History and Physical Interval Note:  02/08/2016 1:54 PM  Gregory [name]  has presented today for surgery, with the diagnosis of flutter  The various methods of treatment have been discussed with the patient and family. After consideration of risks, benefits and other options for treatment, the patient has consented to  Procedure(s): CARDIOVERSION (N/A) as a surgical intervention .  The patient's history has been reviewed, patient examined, no change in status, stable for surgery.  I have reviewed the patient's chart and labs.  Questions were answered to the patient's satisfaction.     Yates DecampGANJI, Tarren Sabree

## 2016-02-08 NOTE — H&P (View-Only) (Signed)
Subjective:  No chest pain, no shortness of breath. Mild palpitations.  Objective:  Vital Signs in the last 24 hours: Temp:  [97.9 F (36.6 C)-98.1 F (36.7 C)] 97.9 F (36.6 C) (08/25 1337) Pulse Rate:  [81] 81 (08/24 2118) Resp:  [18-23] 23 (08/25 1337) BP: (119-129)/(79-90) 129/90 (08/25 1337) SpO2:  [95 %-98 %] 98 % (08/25 0500) Weight:  [88 kg (194 lb)] 88 kg (194 lb) (08/25 0500)  Intake/Output from previous day: 08/24 0701 - 08/25 0700 In: 240 [P.O.:240] Out: 250 [Urine:250]  Physical Exam:  General appearance: alert, cooperative and appears stated age Eyes: negative findings: lids and lashes normal Neck: no adenopathy, no carotid bruit, no JVD, supple, symmetrical, trachea midline and thyroid not enlarged, symmetric, no tenderness/mass/nodules Neck: JVP - normal, carotids 2+= without bruits Resp: clear to auscultation bilaterally Chest wall: no tenderness Cardio: irregularly irregular rhythm and S1 is stable, S2 is normal, 2/6 systolic ejection murmur. GI: soft, non-tender; bowel sounds normal; no masses,  no organomegaly Extremities: extremities normal, atraumatic, no cyanosis or edema    Lab Results: BMP  Recent Labs  01/31/16 0028 02/07/16 1038 02/08/16 0731  NA 138 140 139  K 4.3 4.5 4.1  CL 105 109 108  CO2 26 26 24  GLUCOSE 94 89 102*  BUN 25* 26* 20  CREATININE 1.71* 1.72* 1.42*  CALCIUM 8.8* 9.0 9.1  GFRNONAA 38* 37* 47*  GFRAA 44* 43* 55*    EKG: unchanged from previous tracings, atrial fibrillation, rate uncontrolled and non specirfic ST-T change. Normal QT.   Assessment/Plan:  Symptomatic paroxysmal atrial fibrillation with rapid ventricle response Hypertension Chronic stage IIIa kidney disease Therapeutic drug monitoring, patient admitted for Tikosyn titration.  Recommendation: Patient in persistent A. fib with RVR, will proceed with direct current cardio version today and continue Tikosyn administration. Labs are stable.   Yumalay Circle,  Achaia Garlock, M.D. 02/08/2016, 1:52 PM [facility] Pager: [phone] Office: [phone] If no answer: [phone]  

## 2016-02-09 ENCOUNTER — Other Ambulatory Visit: Payer: Self-pay

## 2016-02-09 MED ORDER — DOFETILIDE 125 MCG PO CAPS: 125.0000 ug | ORAL_CAPSULE | Freq: Two times a day (BID) | ORAL | 0 refills | Status: DC

## 2016-02-09 MED ORDER — DOFETILIDE 125 MCG PO CAPS: 375.0000 ug | ORAL_CAPSULE | Freq: Two times a day (BID) | ORAL | 1 refills | Status: DC

## 2016-02-09 NOTE — Discharge Summary (Signed)
Physician Discharge Summary  Patient ID: Gregory [name] MRN: [id number] DOB/AGE: [birth year] 74 y.o.  Admit date: 02/07/2016 Discharge date: 02/09/2016  Discharge Diagnoses: 1. Atrial fibrillation (CHA2DS2-VASc Score is 1 with yearly risk of stroke of 1.3%.  Bleeding risk is 1.88-3.2%. ASA or long-term anticoagulation appropriate.) 2. HTN 3. CKD Stage III  Hospital Course: Gregory [name]  is a 75 y.o. male with a history of paroxysmal atrial fibrillation. He has history of multiple cardioversions (12/21/2015, 09/05/2014, 06/30/2014, 10/28/2013, 06/08/2012 (on Flecainide), 05/03/2012), A. Fib to SR.  After his last cardioversion on 12/21/2015, he went on a trip to [place] for 3 weeks, states thathe went into atrial fibrillation while he was traveling. He has noticed mild worsening dyspnea and fatigue since being in A. Fib, but he was able to complete the trip. No GI bleed, no dark stools. His other history includes stage III chronic kidney disease. There is no history of hypertension or diabetes mellitus. Flecainide was discontinued and Tikosyn was recommended. Amiodarone or sotalol are not an option due to bradycardia and chronic renal failure. Diltiazem was also changed to metoprolol.   He was admitted on 01/28/2016 for Tikosyn initiation. He underwent DCCV on 01/30/2016 after he did not convert with Tikosyn alone and had a brief episode of asymptomatic NSVT. He was monitored through the night and after 6th dose of Tikosyn with no recurrence of NSVT and maintenance of sinus rhythm.   He again called the office on 02/06/2016 stating he had gone back into a.fib. Presented on the morning of 02/07/2016 with EKG revealing A.fib with RVR and was readmitted for Tikosyn uptitration. Again, underwent DCCV on 8/225/2017, has maintained sinus rhythm.  Recommendations on discharge: Will be discharged on 375mg  Tikosyn. Follow up outpatient as scheduled. Continue Mg supplement to maintain Mg at or  above 2.0.   Discharge Exam: Blood pressure 118/72, pulse (!) 53, temperature 98.4 F (36.9 C), temperature source Oral, resp. rate 18, height 5\' 11"  (1.803 m), weight 88.5 kg (195 lb 1.6 oz), SpO2 97 %.    General appearance: alert, cooperative, appears stated age and no distress Eyes: negative Neck: no adenopathy, no carotid bruit, no JVD, supple, symmetrical, trachea midline and thyroid not enlarged, symmetric, no tenderness/mass/nodules Resp: clear to auscultation bilaterally Chest wall: no tenderness Cardio: regular rhythm, S1, S2: normal and II/IV diastolic murmur at RUSB, distant heart sounds GI: soft, non-tender; bowel sounds normal; no masses,  no organomegaly Extremities: extremities normal, atraumatic, no cyanosis or edema Pulses: 2+ and symmetric Skin: Skin color, texture, turgor normal. No rashes or lesions Neurologic: Grossly normal  Labs:   Lab Results  Component Value Date   WBC 5.8 01/29/2016   HGB 14.4 01/29/2016   HCT 43.5 01/29/2016   MCV 98.2 01/29/2016   PLT 216 01/29/2016     Recent Labs Lab 02/08/16 0731  NA 139  K 4.1  CL 108  CO2 24  BUN 20  CREATININE 1.42*  CALCIUM 9.1  GLUCOSE 102*    Lipid Panel  No results found for: CHOL, TRIG, HDL, CHOLHDL, VLDL, LDLCALC  BNP (last 3 results)  Recent Labs  02/08/16 0402  BNP 402.2*    HEMOGLOBIN A1C No results found for: HGBA1C, MPG  Cardiac Panel (last 3 results) No results for input(s): CKTOTAL, CKMB, TROPONINI, RELINDX in the last 8760 hours.  No results found for: CKTOTAL, CKMB, CKMBINDEX, TROPONINI   TSH No results for input(s): TSH in the last 8760 hours.  Telemetry 02/09/2016: sinus brady  with 1st degree AV block at a rate of 56 bpm.   Outpatient EKG 02/07/2016: Atrial fibrillation with RVR at a rate of 116 bpm, normal axis, nonspecific T-wave abnormality, labeled his complexes.  Compared to EKG on 01/24/2016, rate has increased, otherwise no significant change. QT/QTc  313/46837ms.  Radiology: No results found.    FOLLOW UP PLANS AND APPOINTMENTS  Follow-up Information    Yates DecampGANJI, JAY, MD Follow up on 02/21/2016.   Specialty:  Cardiology Why:  at 2:30pm Contact information: [address] [place] [address] [phone]              Medication List    TAKE these medications   apixaban 5 MG Tabs tablet Commonly known as:  ELIQUIS Take 5 mg by mouth 2 (two) times daily.   diphenhydrAMINE 25 mg capsule Commonly known as:  BENADRYL Take 25 mg by mouth at bedtime as needed for allergies or sleep.   dofetilide 125 MCG capsule Commonly known as:  TIKOSYN Take 3 capsules (375 mcg total) by mouth 2 (two) times daily. What changed:  medication strength  how much to take   dofetilide 125 MCG capsule Commonly known as:  TIKOSYN Take 1 capsule (125 mcg total) by mouth 2 (two) times daily. What changed:  You were already taking a medication with the same name, and this prescription was added. Make sure you understand how and when to take each.   ibuprofen 200 MG tablet Commonly known as:  ADVIL,MOTRIN Take 200-400 mg by mouth every 6 (six) hours as needed for mild pain or moderate pain.   KERASAL FUNGAL NAIL RENEWAL Liqd Apply 1 application topically daily as needed (to affected nails).   lisinopril 5 MG tablet Commonly known as:  PRINIVIL,ZESTRIL Take 5 mg by mouth daily.   magnesium oxide 400 MG tablet Commonly known as:  MAG-OX Take 400 mg by mouth 2 (two) times daily.   metoprolol tartrate 25 MG tablet Commonly known as:  LOPRESSOR Take 25 mg by mouth 2 (two) times daily.   psyllium 58.6 % powder Commonly known as:  METAMUCIL Take by mouth daily after lunch. 1 heaping tablespoonful mixed in 8 oz liquid          Kamari Buch Nicole, NP-C 02/09/2016, 9:53 AM [facility] Pager: [phone] Office: [phone]

## 2016-02-11 ENCOUNTER — Encounter (HOSPITAL_COMMUNITY): Payer: Self-pay | Admitting: Cardiology

## 2016-02-11 MED FILL — DOFETILIDE 125 MCG CAPSULE: 125 | 30 days supply | Qty: 60 | Fill #0

## 2016-02-11 NOTE — Anesthesia Postprocedure Evaluation (Addendum)
Anesthesia Post Note  Patient: Gregory [name]  Procedure(s) Performed: Procedure(s) (LRB): CARDIOVERSION (N/A)  Patient location during evaluation: PACU Anesthesia Type: MAC Level of consciousness: awake and alert Pain management: pain level controlled Vital Signs Assessment: post-procedure vital signs reviewed and stable Respiratory status: spontaneous breathing, nonlabored ventilation, respiratory function stable and patient connected to nasal cannula oxygen Cardiovascular status: stable and blood pressure returned to baseline Anesthetic complications: no    Last Vitals:  Vitals:   02/09/16 0806 02/09/16 0842  BP: 118/72 118/72  Pulse: (!) 53   Resp:    Temp: 36.9 C     Last Pain:  Vitals:   02/09/16 0806  TempSrc: Oral  PainSc:                  Bonita Quinichard S Jakyla Reza

## 2016-02-12 DIAGNOSIS — R972 Elevated prostate specific antigen [PSA]: Secondary | ICD-10-CM | POA: Diagnosis not present

## 2016-02-12 DIAGNOSIS — N400 Enlarged prostate without lower urinary tract symptoms: Secondary | ICD-10-CM | POA: Diagnosis not present

## 2016-02-13 DIAGNOSIS — N189 Chronic kidney disease, unspecified: Secondary | ICD-10-CM

## 2016-02-13 DIAGNOSIS — N183 Chronic kidney disease, stage 3 unspecified: Secondary | ICD-10-CM | POA: Insufficient documentation

## 2016-02-13 DIAGNOSIS — H9190 Unspecified hearing loss, unspecified ear: Secondary | ICD-10-CM | POA: Insufficient documentation

## 2016-02-13 NOTE — Addendum Note (Signed)
Addendum  created 02/13/16 1441 by Bonita Quinichard S Jaliyah Fotheringham, MD   Sign clinical note

## 2016-02-14 DIAGNOSIS — J309 Allergic rhinitis, unspecified: Secondary | ICD-10-CM | POA: Diagnosis not present

## 2016-02-14 DIAGNOSIS — Z09 Encounter for follow-up examination after completed treatment for conditions other than malignant neoplasm: Secondary | ICD-10-CM | POA: Diagnosis not present

## 2016-02-14 DIAGNOSIS — I480 Paroxysmal atrial fibrillation: Secondary | ICD-10-CM | POA: Diagnosis not present

## 2016-02-15 DIAGNOSIS — Z9889 Other specified postprocedural states: Secondary | ICD-10-CM | POA: Diagnosis not present

## 2016-02-15 DIAGNOSIS — N183 Chronic kidney disease, stage 3 (moderate): Secondary | ICD-10-CM | POA: Diagnosis not present

## 2016-02-15 DIAGNOSIS — I440 Atrioventricular block, first degree: Secondary | ICD-10-CM | POA: Diagnosis not present

## 2016-02-15 DIAGNOSIS — I480 Paroxysmal atrial fibrillation: Secondary | ICD-10-CM | POA: Diagnosis not present

## 2016-02-26 DIAGNOSIS — I480 Paroxysmal atrial fibrillation: Secondary | ICD-10-CM | POA: Diagnosis not present

## 2016-02-29 DIAGNOSIS — I480 Paroxysmal atrial fibrillation: Secondary | ICD-10-CM | POA: Diagnosis not present

## 2016-02-29 DIAGNOSIS — I440 Atrioventricular block, first degree: Secondary | ICD-10-CM | POA: Diagnosis not present

## 2016-02-29 DIAGNOSIS — Z9889 Other specified postprocedural states: Secondary | ICD-10-CM | POA: Diagnosis not present

## 2016-02-29 DIAGNOSIS — Z5181 Encounter for therapeutic drug level monitoring: Secondary | ICD-10-CM | POA: Diagnosis not present

## 2016-03-05 DIAGNOSIS — Z0000 Encounter for general adult medical examination without abnormal findings: Secondary | ICD-10-CM | POA: Diagnosis not present

## 2016-07-22 DIAGNOSIS — H26492 Other secondary cataract, left eye: Secondary | ICD-10-CM | POA: Diagnosis not present

## 2016-07-28 DIAGNOSIS — H26492 Other secondary cataract, left eye: Secondary | ICD-10-CM | POA: Diagnosis not present

## 2016-09-04 DIAGNOSIS — I480 Paroxysmal atrial fibrillation: Secondary | ICD-10-CM | POA: Diagnosis not present

## 2016-09-04 DIAGNOSIS — Z5181 Encounter for therapeutic drug level monitoring: Secondary | ICD-10-CM | POA: Diagnosis not present

## 2016-09-08 DIAGNOSIS — I480 Paroxysmal atrial fibrillation: Secondary | ICD-10-CM | POA: Diagnosis not present

## 2016-09-08 DIAGNOSIS — N183 Chronic kidney disease, stage 3 (moderate): Secondary | ICD-10-CM | POA: Diagnosis not present

## 2016-09-08 DIAGNOSIS — Z9889 Other specified postprocedural states: Secondary | ICD-10-CM | POA: Diagnosis not present

## 2016-09-09 DIAGNOSIS — M5432 Sciatica, left side: Secondary | ICD-10-CM | POA: Diagnosis not present

## 2016-09-11 ENCOUNTER — Ambulatory Visit (INDEPENDENT_AMBULATORY_CARE_PROVIDER_SITE_OTHER): Payer: Medicare Other | Admitting: Cardiology

## 2016-09-11 ENCOUNTER — Encounter: Payer: Self-pay | Admitting: Cardiology

## 2016-09-11 VITALS — BP 120/68 | HR 49 | Ht 70.5 in | Wt 200.4 lb

## 2016-09-11 DIAGNOSIS — I480 Paroxysmal atrial fibrillation: Secondary | ICD-10-CM

## 2016-09-11 NOTE — Patient Instructions (Addendum)
Medication Instructions:    Your physician recommends that you continue on your current medications as directed. Please refer to the Current Medication list given to you today.  --- If you need a refill on your cardiac medications before your next appointment, please call your pharmacy. ---  Labwork:  None ordered  Testing/Procedures:  None ordered  Follow-Up:  Your physician wants you to follow-up in: 3 months with Dr. Elberta Fortisamnitz.  You will receive a reminder letter in the mail two months in advance. If you don't receive a letter, please call our office to schedule the follow-up appointment.  Thank you for choosing [facility]!!   Dory HornSherri Iram Astorino, RN [phone]  Any Other Special Instructions Will Be Listed Below (If Applicable).   Cardiac Ablation Cardiac ablation is a procedure to stop some heart tissue from causing problems. The heart has many electrical connections. Sometimes these connections make the heart beat very fast or irregularly. Removing some problem areas can improve the heart rhythm or make it normal. What happens before the procedure?  Follow instructions from your doctor about what you cannot eat or drink.  Ask your doctor about:  Changing or stopping your normal medicines. This is important if you take diabetes medicines or blood thinners.  Taking medicines such as aspirin and ibuprofen. These medicines can thin your blood. Do not take these medicines before your procedure if your doctor tells you not to.  Plan to have someone take you home.  If you will be going home right after the procedure, plan to have someone with you for 24 hours. What happens during the procedure?  To lower your risk of infection:  Your health care team will wash or sanitize their hands.  Your skin will be washed with soap.  Hair may be removed from your neck or groin.  An IV tube will be put into one of your veins.  You will be given a medicine to help you relax  (sedative).  Skin on your neck or groin will be numbed.  A cut (incision) will be made in your neck or groin.  A needle will be put through your cut and into a vein in your neck or groin.  A tube (catheter) will be put into the needle. The tube will be moved to your heart. X-rays (fluoroscopy) will be used to help guide the tube.  Small devices (electrodes) on the tip of the tube will send out electrical currents.  Dye may be put through the tube. This helps your surgeon see your heart.  Electrical energy will be used to scar (ablate) some heart tissue. Your surgeon may use:  Heat (radiofrequency energy).  Laser energy.  Extreme cold (cryoablation).  The tube will be taken out.  Pressure will be held on your cut. This helps stop bleeding.  A bandage (dressing) will be put on your cut. The procedure may vary. What happens after the procedure?  You will be monitored until your medicines have worn off.  Your cut will be watched for bleeding. You will need to lie still for a few hours.  Do not drive for 24 hours or as long as your doctor tells you. Summary  Cardiac ablation is a procedure to stop some heart tissue from causing problems.  Electrical energy will be used to scar (ablate) some heart tissue. This information is not intended to replace advice given to you by your health care provider. Make sure you discuss any questions you have with your health care  provider. Document Released: 02/02/2013 Document Revised: 04/21/2016 Document Reviewed: 04/21/2016 Elsevier Interactive Patient Education  2017 [employer]

## 2016-09-11 NOTE — Progress Notes (Signed)
Electrophysiology Office Note   Date:  09/11/2016   ID:  Gregory ArbourDouglas E Marquis, DOB 1940-11-17, MRN [id number]  PCP:  Pamelia HoitWILSON,FRED HENRY, MD  Cardiologist:  Jacinto HalimGanji Primary Electrophysiologist:  Regan LemmingWill Martin Brianca Fortenberry, MD    Chief Complaint  Patient presents with  . Advice Only    Afib     History of Present Illness: Gregory [name] is a 76 y.o. male who presents today for electrophysiology evaluation.   He has history of atrial fibrillation, hypertension. He has had multiple cardioversions and has since been put on dofetilide. He was previously on flecainide. His symptoms with atrial fibrillation or dyspnea and fatigue. He has had breakthrough atrial fibrillation lasting 1-3 days with last 3-4 months. He spontaneously converts to sinus rhythm. He is on Eliquis for anticoagulation.    Today, he denies symptoms of palpitations, chest pain, shortness of breath, orthopnea, PND, lower extremity edema, claudication, dizziness, presyncope, syncope, bleeding, or neurologic sequela. The patient is tolerating medications without difficulties and is otherwise without complaint today.    Past Medical History:  Diagnosis Date  . Arthritis    "right knee" (01/28/2016)  . CAP (community acquired pneumonia) 2013  . Chronic atrial fibrillation (HCC)   . Diverticulosis of colon (without mention of hemorrhage)   . Dysrhythmia   . High blood pressure   . Internal hemorrhoids without mention of complication   . Personal history of colonic polyps 01/17/2002   adenomatous, tublar adenoma 02/10/2007   Past Surgical History:  Procedure Laterality Date  . CARDIOVERSION  05/03/2012   Procedure: CARDIOVERSION;  Surgeon: Pamella PertJagadeesh R Ganji, MD;  [facility];  Service: Cardiovascular;  Laterality: N/A;  . CARDIOVERSION  06/08/2012   Procedure: CARDIOVERSION;  Surgeon: Pamella PertJagadeesh R Ganji, MD;  [facility];  Service: Cardiovascular;  Laterality: N/A;  Lawrence Marseillestrista /janie  . CARDIOVERSION N/A  10/28/2013   Procedure: CARDIOVERSION;  Surgeon: Pamella PertJagadeesh R Ganji, MD;  [facility];  Service: Cardiovascular;  Laterality: N/A;  . CARDIOVERSION N/A 06/30/2014   Procedure: CARDIOVERSION;  Surgeon: Pamella PertJagadeesh R Ganji, MD;  [facility];  Service: Cardiovascular;  Laterality: N/A;  H&P in file  . CARDIOVERSION N/A 09/05/2014   Procedure: CARDIOVERSION;  Surgeon: Yates DecampJay Ganji, MD;  [facility];  Service: Cardiovascular;  Laterality: N/A;  . CARDIOVERSION N/A 12/24/2015   Procedure: CARDIOVERSION;  Surgeon: Yates DecampJay Ganji, MD;  [facility];  Service: Cardiovascular;  Laterality: N/A;  . CARDIOVERSION N/A 01/30/2016   Procedure: CARDIOVERSION;  Surgeon: Yates DecampJay Ganji, MD;  [facility];  Service: Cardiovascular;  Laterality: N/A;  . CARDIOVERSION N/A 02/08/2016   Procedure: CARDIOVERSION;  Surgeon: Yates DecampJay Ganji, MD;  [facility];  Service: Cardiovascular;  Laterality: N/A;  . CATARACT EXTRACTION W/ INTRAOCULAR LENS  IMPLANT, BILATERAL Bilateral   . COLONOSCOPY    . INGUINAL HERNIA REPAIR Bilateral    as child  . KNEE ARTHROSCOPY Right 2004     Current Outpatient Prescriptions  Medication Sig Dispense Refill  . apixaban (ELIQUIS) 5 MG TABS tablet Take 5 mg by mouth 2 (two) times daily.    . Dermatological Products, Misc. (KERASAL FUNGAL NAIL RENEWAL) LIQD Apply 1 application topically daily as needed (to affected nails).    . diphenhydrAMINE (BENADRYL) 25 mg capsule Take 25 mg by mouth at bedtime as needed for allergies or sleep.     Marland Kitchen. dofetilide (TIKOSYN) 125 MCG capsule Take 125 mcg by mouth 2 (two) times daily.    Marland Kitchen. dofetilide (TIKOSYN) 250 MCG capsule Take  125 mcg by mouth 2 (two) times daily.    Marland Kitchen ibuprofen (ADVIL,MOTRIN) 200 MG tablet Take 200-400 mg by mouth every 6 (six) hours as needed for mild pain or moderate pain.    Marland Kitchen lisinopril (PRINIVIL,ZESTRIL) 5 MG tablet Take 5 mg by mouth daily.    . magnesium oxide (MAG-OX) 400 MG tablet Take 400  mg by mouth 2 (two) times daily.    . metoprolol tartrate (LOPRESSOR) 25 MG tablet Take 25 mg by mouth 2 (two) times daily.    . psyllium (METAMUCIL) 58.6 % powder Take by mouth daily after lunch. 1 heaping tablespoonful mixed in 8 oz liquid     No current facility-administered medications for this visit.     Allergies:   Patient has no known allergies.   Social History:  The patient  reports that he has never smoked. He has never used smokeless tobacco. He reports that he drinks about 4.2 oz of alcohol per week . He reports that he does not use drugs.   Family History:  The patient's family history includes Cancer in his mother; Heart disease in his father; Stroke in his father.    ROS:  Please see the history of present illness.   Otherwise, review of systems is positive for palpitations.   All other systems are reviewed and negative.    PHYSICAL EXAM: VS:  BP 120/68   Pulse (!) 49   Ht 5' 10.5" (1.791 m)   Wt 200 lb 6.4 oz (90.9 kg)   BMI 28.35 kg/m  , BMI Body mass index is 28.35 kg/m. GEN: Well nourished, well developed, in no acute distress  HEENT: normal  Neck: no JVD, carotid bruits, or masses Cardiac: RRR; no murmurs, rubs, or gallops,no edema  Respiratory:  clear to auscultation bilaterally, normal work of breathing GI: soft, nontender, nondistended, + BS MS: no deformity or atrophy  Skin: warm and dry Neuro:  Strength and sensation are intact Psych: euthymic mood, full affect  EKG:  EKG is ordered today. Personal review of the ekg ordered shows sinus rhythm, rate 49, 1 degree AV block  Recent Labs: 01/29/2016: Hemoglobin 14.4; Platelets 216 02/08/2016: B Natriuretic Peptide 402.2; BUN 20; Creatinine, Ser 1.42; Magnesium 2.3; Potassium 4.1; Sodium 139    Lipid Panel  No results found for: CHOL, TRIG, HDL, CHOLHDL, VLDL, LDLCALC, LDLDIRECT   Wt Readings from Last 3 Encounters:  09/11/16 200 lb 6.4 oz (90.9 kg)  02/09/16 195 lb 1.6 oz (88.5 kg)  01/28/16 199  lb (90.3 kg)      Other studies Reviewed: Additional studies/ records that were reviewed today include: TTE 03/02/16  Review of the above records today demonstrates:  Mild concentric LVH. EF 68%. Moderately dilated left atrium. Mild tricuspid regurgitation without evidence of pulmonary hypertension.   ASSESSMENT AND PLAN:  1.  Permanent atrial fibrillation: Currently on Eliquis and dofetilide. He is having breakthrough episodes of atrial fibrillation. Risks and benefits of ablation were discussed. Risks include bleeding, tamponade, heart block, stroke, and damage to surrounding organs. He says that he would like to think about the possibility of ablation. He Dabney Dever monitor his symptoms over the next few months. We Letisia Schwalb see him back in 3 months to further discuss ablation.  This patients CHA2DS2-VASc Score and unadjusted Ischemic Stroke Rate (% per year) is equal to 2.2 % stroke rate/year from a score of 2  Above score calculated as 1 point each if present [CHF, HTN, DM, Vascular=MI/PAD/Aortic Plaque, Age if 64-74, or  Male] Above score calculated as 2 points each if present [Age > 75, or Stroke/TIA/TE]   2. Hypertension: well controlled today    Current medicines are reviewed at length with the patient today.   The patient does not have concerns regarding his medicines.  The following changes were made today:  none  Labs/ tests ordered today include:  Orders Placed This Encounter  Procedures  . EKG 12-Lead  . EKG 12-Lead     Disposition:   FU with Aubrey Voong 3 months  Signed, Stacy Deshler Jorja Loa, MD  09/11/2016 10:54 AM     [facility] [address] [place] [address] [phone] (office) [phone] (fax)

## 2016-09-15 DIAGNOSIS — M9903 Segmental and somatic dysfunction of lumbar region: Secondary | ICD-10-CM | POA: Diagnosis not present

## 2016-09-15 DIAGNOSIS — M5432 Sciatica, left side: Secondary | ICD-10-CM | POA: Diagnosis not present

## 2016-09-17 DIAGNOSIS — M5432 Sciatica, left side: Secondary | ICD-10-CM | POA: Diagnosis not present

## 2016-09-17 DIAGNOSIS — M9903 Segmental and somatic dysfunction of lumbar region: Secondary | ICD-10-CM | POA: Diagnosis not present

## 2016-09-18 DIAGNOSIS — M5432 Sciatica, left side: Secondary | ICD-10-CM | POA: Diagnosis not present

## 2016-09-18 DIAGNOSIS — M9903 Segmental and somatic dysfunction of lumbar region: Secondary | ICD-10-CM | POA: Diagnosis not present

## 2016-09-22 DIAGNOSIS — M9903 Segmental and somatic dysfunction of lumbar region: Secondary | ICD-10-CM | POA: Diagnosis not present

## 2016-09-22 DIAGNOSIS — M5432 Sciatica, left side: Secondary | ICD-10-CM | POA: Diagnosis not present

## 2016-09-23 DIAGNOSIS — M9903 Segmental and somatic dysfunction of lumbar region: Secondary | ICD-10-CM | POA: Diagnosis not present

## 2016-09-23 DIAGNOSIS — M5432 Sciatica, left side: Secondary | ICD-10-CM | POA: Diagnosis not present

## 2016-09-25 DIAGNOSIS — M5432 Sciatica, left side: Secondary | ICD-10-CM | POA: Diagnosis not present

## 2016-09-25 DIAGNOSIS — M9903 Segmental and somatic dysfunction of lumbar region: Secondary | ICD-10-CM | POA: Diagnosis not present

## 2016-09-29 DIAGNOSIS — M9903 Segmental and somatic dysfunction of lumbar region: Secondary | ICD-10-CM | POA: Diagnosis not present

## 2016-09-29 DIAGNOSIS — M5432 Sciatica, left side: Secondary | ICD-10-CM | POA: Diagnosis not present

## 2016-10-02 DIAGNOSIS — M5432 Sciatica, left side: Secondary | ICD-10-CM | POA: Diagnosis not present

## 2016-10-02 DIAGNOSIS — M9903 Segmental and somatic dysfunction of lumbar region: Secondary | ICD-10-CM | POA: Diagnosis not present

## 2016-10-06 DIAGNOSIS — M5432 Sciatica, left side: Secondary | ICD-10-CM | POA: Diagnosis not present

## 2016-10-06 DIAGNOSIS — M9903 Segmental and somatic dysfunction of lumbar region: Secondary | ICD-10-CM | POA: Diagnosis not present

## 2016-10-13 DIAGNOSIS — M9903 Segmental and somatic dysfunction of lumbar region: Secondary | ICD-10-CM | POA: Diagnosis not present

## 2016-10-13 DIAGNOSIS — M5432 Sciatica, left side: Secondary | ICD-10-CM | POA: Diagnosis not present

## 2016-10-16 DIAGNOSIS — M9903 Segmental and somatic dysfunction of lumbar region: Secondary | ICD-10-CM | POA: Diagnosis not present

## 2016-10-16 DIAGNOSIS — M5432 Sciatica, left side: Secondary | ICD-10-CM | POA: Diagnosis not present

## 2016-10-27 DIAGNOSIS — M5432 Sciatica, left side: Secondary | ICD-10-CM | POA: Diagnosis not present

## 2016-10-27 DIAGNOSIS — M9903 Segmental and somatic dysfunction of lumbar region: Secondary | ICD-10-CM | POA: Diagnosis not present

## 2016-10-29 ENCOUNTER — Telehealth: Payer: Self-pay | Admitting: Cardiology

## 2016-10-29 DIAGNOSIS — I4821 Permanent atrial fibrillation: Secondary | ICD-10-CM

## 2016-10-29 DIAGNOSIS — Z01812 Encounter for preprocedural laboratory examination: Secondary | ICD-10-CM

## 2016-10-29 NOTE — Telephone Encounter (Signed)
Pt would like to proceed w/ ablation.  Pt understands I will call him tomorrow to schedule ablation date.

## 2016-10-29 NOTE — Telephone Encounter (Signed)
New Message  Pt voiced wanting to speak to nurse in regards to information he was told to think about.  Pt would not go into further detail when questions were asked.

## 2016-10-31 ENCOUNTER — Ambulatory Visit (INDEPENDENT_AMBULATORY_CARE_PROVIDER_SITE_OTHER): Payer: Medicare Other | Admitting: Orthopaedic Surgery

## 2016-10-31 ENCOUNTER — Ambulatory Visit (INDEPENDENT_AMBULATORY_CARE_PROVIDER_SITE_OTHER): Payer: Medicare Other

## 2016-10-31 ENCOUNTER — Encounter (INDEPENDENT_AMBULATORY_CARE_PROVIDER_SITE_OTHER): Payer: Self-pay | Admitting: Orthopaedic Surgery

## 2016-10-31 DIAGNOSIS — M7052 Other bursitis of knee, left knee: Secondary | ICD-10-CM | POA: Diagnosis not present

## 2016-10-31 MED ORDER — DICLOFENAC SODIUM 1 % TD GEL: 2.0000 g | Freq: Four times a day (QID) | TRANSDERMAL | 5 refills | Status: DC

## 2016-10-31 NOTE — Progress Notes (Signed)
Office Visit Note   Patient: Gregory [name]           Date of Birth: [birth year]           MRN: [id number] Visit Date: 10/31/2016              Requested by: [name], MD [address] [place], [place] [address] PCP: [name], MD   Assessment & Plan: Visit Diagnoses:  1. Pes anserinus bursitis of left knee     Plan: Impression is as anserine bursitis. I did offer an injection. We also talked about conservative treatment such as icing and voltaren gel and rest. He would like to try that first. I'll see him back as needed.  Follow-Up Instructions: Return if symptoms worsen or fail to improve.   Orders:  Orders Placed This Encounter  Procedures  . XR KNEE 3 VIEW LEFT   Meds ordered this encounter  Medications  . diclofenac sodium (VOLTAREN) 1 % GEL    Sig: Apply 2 g topically 4 (four) times daily.    Dispense:  1 Tube    Refill:  5      Procedures: No procedures performed   Clinical Data: No additional findings.   Subjective: Chief Complaint  Patient presents with  . Left Knee - Pain    Patient is a 76 year old gentleman who comes in with left knee pain on the medial aspect. He denies any injuries. He is very active. This is been going on for about 2 weeks. It hurts around the proximal medial tibia. Denies any radiation of pain.    Review of Systems  Constitutional: Negative.   All other systems reviewed and are negative.    Objective: Vital Signs: There were no vitals taken for this visit.  Physical Exam  Constitutional: He is oriented to person, place, and time. He appears well-developed and well-nourished.  HENT:  Head: Normocephalic and atraumatic.  Eyes: Pupils are equal, round, and reactive to light.  Neck: Neck supple.  Pulmonary/Chest: Effort normal.  Abdominal: Soft.  Musculoskeletal: Normal range of motion.  Neurological: He is alert and oriented to person, place, and time.  Skin: Skin is warm.  Psychiatric: He has a  normal mood and affect. His behavior is normal. Judgment and thought content normal.  Nursing note and vitals reviewed.   Ortho Exam Left knee exam shows no joint effusion. He has full range of motion. Collaterals and cruciates are stable. His pain is anserine bursa is swollen and tender to palpation. Specialty Comments:  No specialty comments available.  Imaging: Xr Knee 3 View Left  Result Date: 10/31/2016 No acute abnormalities    PMFS History: Patient Active Problem List   Diagnosis Date Noted  . Pes anserinus bursitis of left knee 10/31/2016  . Atrial fibrillation (HCC) 02/07/2016  . PNA (pneumonia) 03/31/2012  . Atrial fibrillation with RVR (HCC) 03/31/2012  . Paroxysmal atrial fibrillation (HCC) 03/31/2012  . Hypertension 03/31/2012  . Renal insufficiency 03/31/2012  . Hemorrhoids, external 05/15/2011   Past Medical History:  Diagnosis Date  . Arthritis    "right knee" (01/28/2016)  . CAP (community acquired pneumonia) 2013  . Chronic atrial fibrillation (HCC)   . Diverticulosis of colon (without mention of hemorrhage)   . Dysrhythmia   . High blood pressure   . Internal hemorrhoids without mention of complication   . Personal history of colonic polyps 01/17/2002   adenomatous, tublar adenoma 02/10/2007    Family History  Problem  Relation Age of Onset  . Heart disease Father   . Stroke Father   . Cancer Mother        bladder  . Colon cancer Neg Hx   . Stomach cancer Neg Hx     Past Surgical History:  Procedure Laterality Date  . CARDIOVERSION  05/03/2012   Procedure: CARDIOVERSION;  Surgeon: Pamella PertJagadeesh R Ganji, MD;  [facility];  Service: Cardiovascular;  Laterality: N/A;  . CARDIOVERSION  06/08/2012   Procedure: CARDIOVERSION;  Surgeon: Pamella PertJagadeesh R Ganji, MD;  [facility];  Service: Cardiovascular;  Laterality: N/A;  Lawrence Marseillestrista /janie  . CARDIOVERSION N/A 10/28/2013   Procedure: CARDIOVERSION;  Surgeon: Pamella PertJagadeesh R Ganji, MD;  [facility];  Service: Cardiovascular;  Laterality: N/A;  . CARDIOVERSION N/A 06/30/2014   Procedure: CARDIOVERSION;  Surgeon: Pamella PertJagadeesh R Ganji, MD;  [facility];  Service: Cardiovascular;  Laterality: N/A;  H&P in file  . CARDIOVERSION N/A 09/05/2014   Procedure: CARDIOVERSION;  Surgeon: Yates DecampJay Ganji, MD;  [facility];  Service: Cardiovascular;  Laterality: N/A;  . CARDIOVERSION N/A 12/24/2015   Procedure: CARDIOVERSION;  Surgeon: Yates DecampJay Ganji, MD;  [facility];  Service: Cardiovascular;  Laterality: N/A;  . CARDIOVERSION N/A 01/30/2016   Procedure: CARDIOVERSION;  Surgeon: Yates DecampJay Ganji, MD;  [facility];  Service: Cardiovascular;  Laterality: N/A;  . CARDIOVERSION N/A 02/08/2016   Procedure: CARDIOVERSION;  Surgeon: Yates DecampJay Ganji, MD;  [facility];  Service: Cardiovascular;  Laterality: N/A;  . CATARACT EXTRACTION W/ INTRAOCULAR LENS  IMPLANT, BILATERAL Bilateral   . COLONOSCOPY    . INGUINAL HERNIA REPAIR Bilateral    as child  . KNEE ARTHROSCOPY Right 2004   Social History   Occupational History  .  [employer]   Social History Main Topics  . Smoking status: Never Smoker  . Smokeless tobacco: Never Used  . Alcohol use 4.2 oz/week    7 Glasses of wine per week  . Drug use: No  . Sexual activity: Not Currently

## 2016-11-03 NOTE — Telephone Encounter (Signed)
Follow Up:   Pt calling to see if you have dates for his  Ablation?

## 2016-11-03 NOTE — Telephone Encounter (Signed)
Pt would like to have ablation performed on 6/15. Will schedule and call him by end of week to review instructions. Patient verbalized understanding and agreeable to plan.

## 2016-11-06 ENCOUNTER — Encounter: Payer: Self-pay | Admitting: *Deleted

## 2016-11-06 NOTE — Telephone Encounter (Signed)
RFA on 6/15. Pre procedure labs on 6/6. He understands office will call him to arrange pre ablation CT. Post procedure f/u appts scheduled. Letter of instructions reviewed with pt and mailed to home address. Patient verbalized understanding and agreeable to plan.

## 2016-11-07 ENCOUNTER — Other Ambulatory Visit: Payer: Self-pay | Admitting: Cardiology

## 2016-11-11 ENCOUNTER — Encounter: Payer: Self-pay | Admitting: Cardiology

## 2016-11-17 DIAGNOSIS — M5432 Sciatica, left side: Secondary | ICD-10-CM | POA: Diagnosis not present

## 2016-11-17 DIAGNOSIS — M9903 Segmental and somatic dysfunction of lumbar region: Secondary | ICD-10-CM | POA: Diagnosis not present

## 2016-11-19 ENCOUNTER — Other Ambulatory Visit: Payer: Medicare Other | Admitting: *Deleted

## 2016-11-19 DIAGNOSIS — I482 Chronic atrial fibrillation: Secondary | ICD-10-CM | POA: Diagnosis not present

## 2016-11-19 DIAGNOSIS — Z01812 Encounter for preprocedural laboratory examination: Secondary | ICD-10-CM

## 2016-11-19 DIAGNOSIS — I4821 Permanent atrial fibrillation: Secondary | ICD-10-CM

## 2016-11-20 LAB — CBC WITH DIFFERENTIAL/PLATELET
BASOS: 0 %
Basophils Absolute: 0 10*3/uL (ref 0.0–0.2)
EOS (ABSOLUTE): 0.2 10*3/uL (ref 0.0–0.4)
EOS: 3 %
HEMOGLOBIN: 14.5 g/dL (ref 13.0–17.7)
Hematocrit: 43.3 % (ref 37.5–51.0)
IMMATURE GRANS (ABS): 0 10*3/uL (ref 0.0–0.1)
IMMATURE GRANULOCYTES: 0 %
LYMPHS: 37 %
Lymphocytes Absolute: 2 10*3/uL (ref 0.7–3.1)
MCH: 32.5 pg (ref 26.6–33.0)
MCHC: 33.5 g/dL (ref 31.5–35.7)
MCV: 97 fL (ref 79–97)
MONOCYTES: 10 %
Monocytes Absolute: 0.5 10*3/uL (ref 0.1–0.9)
NEUTROS ABS: 2.7 10*3/uL (ref 1.4–7.0)
NEUTROS PCT: 50 %
PLATELETS: 204 10*3/uL (ref 150–379)
RBC: 4.46 x10E6/uL (ref 4.14–5.80)
RDW: 13.9 % (ref 12.3–15.4)
WBC: 5.3 10*3/uL (ref 3.4–10.8)

## 2016-11-20 LAB — BASIC METABOLIC PANEL
BUN/Creatinine Ratio: 20 (ref 10–24)
BUN: 32 mg/dL — ABNORMAL HIGH (ref 8–27)
CALCIUM: 9.3 mg/dL (ref 8.6–10.2)
CHLORIDE: 103 mmol/L (ref 96–106)
CO2: 23 mmol/L (ref 18–29)
Creatinine, Ser: 1.63 mg/dL — ABNORMAL HIGH (ref 0.76–1.27)
GFR calc Af Amer: 47 mL/min/{1.73_m2} — ABNORMAL LOW (ref >59)
GFR, EST NON AFRICAN AMERICAN: 41 mL/min/{1.73_m2} — AB (ref >59)
Glucose: 110 mg/dL — ABNORMAL HIGH (ref 65–99)
POTASSIUM: 4.7 mmol/L (ref 3.5–5.2)
SODIUM: 140 mmol/L (ref 134–144)

## 2016-11-24 ENCOUNTER — Ambulatory Visit (HOSPITAL_COMMUNITY)
Admission: RE | Admit: 2016-11-24 | Discharge: 2016-11-24 | Disposition: A | Discharge status: Home / Self Care | Payer: Medicare Other | Source: Ambulatory Visit | Attending: Cardiology | Admitting: Cardiology

## 2016-11-24 ENCOUNTER — Encounter (HOSPITAL_COMMUNITY): Payer: Self-pay

## 2016-11-24 DIAGNOSIS — I482 Chronic atrial fibrillation: Secondary | ICD-10-CM | POA: Diagnosis not present

## 2016-11-24 DIAGNOSIS — I4821 Permanent atrial fibrillation: Secondary | ICD-10-CM

## 2016-11-24 DIAGNOSIS — I281 Aneurysm of pulmonary artery: Secondary | ICD-10-CM | POA: Diagnosis not present

## 2016-11-24 DIAGNOSIS — I4891 Unspecified atrial fibrillation: Secondary | ICD-10-CM | POA: Diagnosis not present

## 2016-11-24 MED ORDER — IOPAMIDOL (ISOVUE-370) INJECTION 76%
INTRAVENOUS | Status: AC
  Administered 2016-11-24: 80 mL
  Filled 2016-11-24: qty 100

## 2016-11-27 ENCOUNTER — Encounter (HOSPITAL_COMMUNITY): Payer: Self-pay | Admitting: Anesthesiology

## 2016-11-28 ENCOUNTER — Ambulatory Visit (HOSPITAL_COMMUNITY): Payer: Medicare Other | Admitting: Anesthesiology

## 2016-11-28 ENCOUNTER — Ambulatory Visit (HOSPITAL_COMMUNITY)
Admission: RE | Admit: 2016-11-28 | Discharge: 2016-11-29 | Disposition: A | Discharge status: Home / Self Care | Payer: Medicare Other | Source: Ambulatory Visit | Attending: Cardiology | Admitting: Cardiology

## 2016-11-28 ENCOUNTER — Encounter (HOSPITAL_COMMUNITY): Payer: Self-pay | Admitting: General Practice

## 2016-11-28 ENCOUNTER — Encounter (HOSPITAL_COMMUNITY)
Admission: RE | Disposition: A | Discharge status: Home / Self Care | Payer: Self-pay | Source: Ambulatory Visit | Attending: Cardiology

## 2016-11-28 DIAGNOSIS — I481 Persistent atrial fibrillation: Secondary | ICD-10-CM | POA: Diagnosis not present

## 2016-11-28 DIAGNOSIS — M1711 Unilateral primary osteoarthritis, right knee: Secondary | ICD-10-CM | POA: Diagnosis not present

## 2016-11-28 DIAGNOSIS — Z7901 Long term (current) use of anticoagulants: Secondary | ICD-10-CM | POA: Diagnosis not present

## 2016-11-28 DIAGNOSIS — I4891 Unspecified atrial fibrillation: Secondary | ICD-10-CM | POA: Diagnosis present

## 2016-11-28 DIAGNOSIS — I480 Paroxysmal atrial fibrillation: Secondary | ICD-10-CM | POA: Diagnosis not present

## 2016-11-28 DIAGNOSIS — I10 Essential (primary) hypertension: Secondary | ICD-10-CM | POA: Insufficient documentation

## 2016-11-28 HISTORY — PX: ATRIAL FIBRILLATION ABLATION: EP1191

## 2016-11-28 LAB — BASIC METABOLIC PANEL
ANION GAP: 8 (ref 5–15)
BUN: 26 mg/dL — ABNORMAL HIGH (ref 6–20)
CALCIUM: 8.5 mg/dL — AB (ref 8.9–10.3)
CHLORIDE: 109 mmol/L (ref 101–111)
CO2: 22 mmol/L (ref 22–32)
Creatinine, Ser: 1.42 mg/dL — ABNORMAL HIGH (ref 0.61–1.24)
GFR calc non Af Amer: 47 mL/min — ABNORMAL LOW (ref >60)
GFR, EST AFRICAN AMERICAN: 54 mL/min — AB (ref >60)
GLUCOSE: 183 mg/dL — AB (ref 65–99)
Potassium: 4.1 mmol/L (ref 3.5–5.1)
Sodium: 139 mmol/L (ref 135–145)

## 2016-11-28 LAB — POCT ACTIVATED CLOTTING TIME: ACTIVATED CLOTTING TIME: 180 s

## 2016-11-28 LAB — MAGNESIUM: Magnesium: 2.1 mg/dL (ref 1.7–2.4)

## 2016-11-28 SURGERY — ATRIAL FIBRILLATION ABLATION
Anesthesia: General

## 2016-11-28 MED ORDER — OFF THE BEAT BOOK
Freq: Once | Status: AC
  Administered 2016-11-28
  Filled 2016-11-28: qty 1

## 2016-11-28 MED ORDER — MEPERIDINE HCL 25 MG/ML IJ SOLN: 6.2500 mg | INTRAMUSCULAR | Status: DC | PRN

## 2016-11-28 MED ORDER — PROTAMINE SULFATE 10 MG/ML IV SOLN
INTRAVENOUS | Status: DC | PRN
  Administered 2016-11-28 (×4): 10 mg via INTRAVENOUS

## 2016-11-28 MED ORDER — SODIUM CHLORIDE 0.9 % IV SOLN: 250.0000 mL | INTRAVENOUS | Status: DC | PRN

## 2016-11-28 MED ORDER — DOBUTAMINE IN D5W 4-5 MG/ML-% IV SOLN
INTRAVENOUS | Status: AC
  Filled 2016-11-28: qty 250

## 2016-11-28 MED ORDER — IBUPROFEN 200 MG PO TABS: 200.0000 mg | ORAL_TABLET | Freq: Four times a day (QID) | ORAL | Status: DC | PRN

## 2016-11-28 MED ORDER — HEPARIN SODIUM (PORCINE) 1000 UNIT/ML IJ SOLN
INTRAMUSCULAR | Status: AC
  Filled 2016-11-28: qty 1

## 2016-11-28 MED ORDER — SUGAMMADEX SODIUM 200 MG/2ML IV SOLN
INTRAVENOUS | Status: DC | PRN
  Administered 2016-11-28: 200 mg via INTRAVENOUS

## 2016-11-28 MED ORDER — ONDANSETRON HCL 4 MG/2ML IJ SOLN: 4.0000 mg | Freq: Four times a day (QID) | INTRAMUSCULAR | Status: DC | PRN

## 2016-11-28 MED ORDER — ACETAMINOPHEN 325 MG PO TABS: 650.0000 mg | ORAL_TABLET | ORAL | Status: DC | PRN

## 2016-11-28 MED ORDER — BUPIVACAINE HCL (PF) 0.25 % IJ SOLN
INTRAMUSCULAR | Status: DC | PRN
  Administered 2016-11-28: 25 mL

## 2016-11-28 MED ORDER — BUPIVACAINE HCL (PF) 0.25 % IJ SOLN
INTRAMUSCULAR | Status: AC
  Filled 2016-11-28: qty 30

## 2016-11-28 MED ORDER — SODIUM CHLORIDE 0.9% FLUSH: 3.0000 mL | INTRAVENOUS | Status: DC | PRN

## 2016-11-28 MED ORDER — SODIUM CHLORIDE 0.9% FLUSH
3.0000 mL | Freq: Two times a day (BID) | INTRAVENOUS | Status: DC
  Administered 2016-11-28 (×2): 3 mL via INTRAVENOUS

## 2016-11-28 MED ORDER — LIDOCAINE HCL (CARDIAC) 20 MG/ML IV SOLN
INTRAVENOUS | Status: DC | PRN
  Administered 2016-11-28: 100 mg via INTRAVENOUS

## 2016-11-28 MED ORDER — HYDROMORPHONE HCL 1 MG/ML IJ SOLN: 0.2500 mg | INTRAMUSCULAR | Status: DC | PRN

## 2016-11-28 MED ORDER — SODIUM CHLORIDE 0.9 % IV SOLN
INTRAVENOUS | Status: DC | PRN
  Administered 2016-11-28 (×2): via INTRAVENOUS

## 2016-11-28 MED ORDER — HEPARIN SODIUM (PORCINE) 1000 UNIT/ML IJ SOLN
INTRAMUSCULAR | Status: DC | PRN
  Administered 2016-11-28: 2000 [IU] via INTRAVENOUS
  Administered 2016-11-28: 5000 [IU] via INTRAVENOUS
  Administered 2016-11-28: 3000 [IU] via INTRAVENOUS
  Administered 2016-11-28: 14000 [IU] via INTRAVENOUS

## 2016-11-28 MED ORDER — EPHEDRINE SULFATE 50 MG/ML IJ SOLN
INTRAMUSCULAR | Status: DC | PRN
  Administered 2016-11-28 (×5): 5 mg via INTRAVENOUS

## 2016-11-28 MED ORDER — ROCURONIUM BROMIDE 100 MG/10ML IV SOLN
INTRAVENOUS | Status: DC | PRN
  Administered 2016-11-28: 50 mg via INTRAVENOUS
  Administered 2016-11-28: 10 mg via INTRAVENOUS

## 2016-11-28 MED ORDER — PROPOFOL 10 MG/ML IV BOLUS
INTRAVENOUS | Status: DC | PRN
  Administered 2016-11-28: 20 mg via INTRAVENOUS
  Administered 2016-11-28: 120 mg via INTRAVENOUS
  Administered 2016-11-28 (×2): 30 mg via INTRAVENOUS

## 2016-11-28 MED ORDER — ONDANSETRON HCL 4 MG/2ML IJ SOLN
INTRAMUSCULAR | Status: DC | PRN
  Administered 2016-11-28: 4 mg via INTRAVENOUS

## 2016-11-28 MED ORDER — METOPROLOL TARTRATE 25 MG PO TABS
50.0000 mg | ORAL_TABLET | Freq: Two times a day (BID) | ORAL | Status: DC
  Administered 2016-11-28 – 2016-11-29 (×2): 50 mg via ORAL
  Filled 2016-11-28 (×2): qty 2

## 2016-11-28 MED ORDER — LISINOPRIL 5 MG PO TABS
5.0000 mg | ORAL_TABLET | Freq: Every day | ORAL | Status: DC
  Administered 2016-11-28 – 2016-11-29 (×2): 5 mg via ORAL
  Filled 2016-11-28 (×2): qty 1

## 2016-11-28 MED ORDER — DOFETILIDE 250 MCG PO CAPS
250.0000 ug | ORAL_CAPSULE | Freq: Two times a day (BID) | ORAL | Status: DC
Start: 2016-11-28 — End: 2016-11-29
  Administered 2016-11-28 – 2016-11-29 (×2): 250 ug via ORAL
  Filled 2016-11-28 (×2): qty 1

## 2016-11-28 MED ORDER — APIXABAN 5 MG PO TABS
5.0000 mg | ORAL_TABLET | Freq: Two times a day (BID) | ORAL | Status: DC
Start: 2016-11-28 — End: 2016-11-29
  Administered 2016-11-28 – 2016-11-29 (×2): 5 mg via ORAL
  Filled 2016-11-28 (×2): qty 1

## 2016-11-28 MED ORDER — DOFETILIDE 125 MCG PO CAPS
125.0000 ug | ORAL_CAPSULE | Freq: Two times a day (BID) | ORAL | Status: DC
  Administered 2016-11-28 – 2016-11-29 (×2): 125 ug via ORAL
  Filled 2016-11-28 (×2): qty 1

## 2016-11-28 MED ORDER — DEXAMETHASONE SODIUM PHOSPHATE 10 MG/ML IJ SOLN
INTRAMUSCULAR | Status: DC | PRN
  Administered 2016-11-28: 10 mg via INTRAVENOUS

## 2016-11-28 MED ORDER — MAGNESIUM OXIDE 400 (241.3 MG) MG PO TABS
400.0000 mg | ORAL_TABLET | Freq: Two times a day (BID) | ORAL | Status: DC
  Administered 2016-11-28 – 2016-11-29 (×2): 400 mg via ORAL
  Filled 2016-11-28 (×2): qty 1

## 2016-11-28 MED ORDER — FENTANYL CITRATE (PF) 100 MCG/2ML IJ SOLN
INTRAMUSCULAR | Status: DC | PRN
  Administered 2016-11-28: 100 ug via INTRAVENOUS

## 2016-11-28 MED ORDER — ONDANSETRON HCL 4 MG/2ML IJ SOLN: 4.0000 mg | Freq: Once | INTRAMUSCULAR | Status: DC | PRN

## 2016-11-28 MED ORDER — DOBUTAMINE-DEXTROSE 2-5 MG/ML-% IV SOLN
INTRAVENOUS | Status: DC | PRN
  Administered 2016-11-28: 20 ug/kg/min via INTRAVENOUS

## 2016-11-28 MED ORDER — HEPARIN (PORCINE) IN NACL 2-0.9 UNIT/ML-% IJ SOLN
INTRAMUSCULAR | Status: AC | PRN
  Administered 2016-11-28: 2000 mL

## 2016-11-28 MED ORDER — DIPHENHYDRAMINE HCL 25 MG PO CAPS: 25.0000 mg | ORAL_CAPSULE | Freq: Every evening | ORAL | Status: DC | PRN

## 2016-11-28 MED ORDER — HEPARIN SODIUM (PORCINE) 1000 UNIT/ML IJ SOLN
INTRAMUSCULAR | Status: DC | PRN
  Administered 2016-11-28: 1000 [IU] via INTRAVENOUS

## 2016-11-28 MED ORDER — MIDAZOLAM HCL 5 MG/5ML IJ SOLN
INTRAMUSCULAR | Status: DC | PRN
  Administered 2016-11-28: 2 mg via INTRAVENOUS

## 2016-11-28 SURGICAL SUPPLY — 20 items
BAG SNAP BAND KOVER 36X36 (MISCELLANEOUS) ×3 IMPLANT
CATH SMTCH THERMOCOOL SF DF (CATHETERS) ×2 IMPLANT
CATH SOUNDSTAR ECO REPROCESSED (CATHETERS) ×2 IMPLANT
CATH VARIABLE LASSO NAV 2515 (CATHETERS) ×2 IMPLANT
CATH WEBSTER BI DIR CS D-F CRV (CATHETERS) ×2 IMPLANT
COVER SWIFTLINK CONNECTOR (BAG) ×3 IMPLANT
NDL TRANSSEPTAL BRK 98CM (NEEDLE) IMPLANT
NDL TRANSSEPTAL BRK XS 98CM (NEEDLE) IMPLANT
NEEDLE TRANSSEPTAL BRK 98CM (NEEDLE) ×3 IMPLANT
NEEDLE TRANSSEPTAL BRK XS 98CM (NEEDLE) ×3 IMPLANT
PACK EP LATEX FREE (CUSTOM PROCEDURE TRAY) ×3
PACK EP LF (CUSTOM PROCEDURE TRAY) ×1 IMPLANT
PAD DEFIB LIFELINK (PAD) ×3 IMPLANT
PATCH CARTO3 (PAD) ×2 IMPLANT
SHEATH AGILIS NXT 8.5F 71CM (SHEATH) ×4 IMPLANT
SHEATH AVANTI 11F 11CM (SHEATH) ×2 IMPLANT
SHEATH PINNACLE 7F 10CM (SHEATH) ×2 IMPLANT
SHEATH PINNACLE 8F 10CM (SHEATH) ×4 IMPLANT
SHEATH PINNACLE 9F 10CM (SHEATH) ×4 IMPLANT
TUBING SMART ABLATE COOLFLOW (TUBING) ×2 IMPLANT

## 2016-11-28 NOTE — H&P (Signed)
History of Present Illness: Gregory ArbourDouglas E [name] is a 76 y.o. male who presents today for electrophysiology evaluation.  he has a history of atrial fibrillation and presents for ablation. Today, denies symptoms of palpitations, chest pain, shortness of breath, orthopnea, PND, lower extremity edema, claudication, dizziness, presyncope, syncope, bleeding, or neurologic sequela. The patient is tolerating medications without difficulties and is otherwise without complaint today.        Past Medical History:  Diagnosis Date  . Arthritis    "right knee" (01/28/2016)  . CAP (community acquired pneumonia) 2013  . Chronic atrial fibrillation (HCC)   . Diverticulosis of colon (without mention of hemorrhage)   . Dysrhythmia   . High blood pressure   . Internal hemorrhoids without mention of complication   . Personal history of colonic polyps 01/17/2002   adenomatous, tublar adenoma 02/10/2007        Past Surgical History:  Procedure Laterality Date  . CARDIOVERSION  05/03/2012   Procedure: CARDIOVERSION;  Surgeon: Pamella PertJagadeesh R Ganji, MD;  [facility];  Service: Cardiovascular;  Laterality: N/A;  . CARDIOVERSION  06/08/2012   Procedure: CARDIOVERSION;  Surgeon: Pamella PertJagadeesh R Ganji, MD;  [facility];  Service: Cardiovascular;  Laterality: N/A;  Lawrence Marseillestrista /janie  . CARDIOVERSION N/A 10/28/2013   Procedure: CARDIOVERSION;  Surgeon: Pamella PertJagadeesh R Ganji, MD;  [facility];  Service: Cardiovascular;  Laterality: N/A;  . CARDIOVERSION N/A 06/30/2014   Procedure: CARDIOVERSION;  Surgeon: Pamella PertJagadeesh R Ganji, MD;  [facility];  Service: Cardiovascular;  Laterality: N/A;  H&P in file  . CARDIOVERSION N/A 09/05/2014   Procedure: CARDIOVERSION;  Surgeon: Yates DecampJay Ganji, MD;  [facility];  Service: Cardiovascular;  Laterality: N/A;  . CARDIOVERSION N/A 12/24/2015   Procedure: CARDIOVERSION;  Surgeon: Yates DecampJay Ganji, MD;  [facility];  Service:  Cardiovascular;  Laterality: N/A;  . CARDIOVERSION N/A 01/30/2016   Procedure: CARDIOVERSION;  Surgeon: Yates DecampJay Ganji, MD;  [facility];  Service: Cardiovascular;  Laterality: N/A;  . CARDIOVERSION N/A 02/08/2016   Procedure: CARDIOVERSION;  Surgeon: Yates DecampJay Ganji, MD;  [facility];  Service: Cardiovascular;  Laterality: N/A;  . CATARACT EXTRACTION W/ INTRAOCULAR LENS  IMPLANT, BILATERAL Bilateral   . COLONOSCOPY    . INGUINAL HERNIA REPAIR Bilateral    as child  . KNEE ARTHROSCOPY Right 2004           Current Outpatient Prescriptions  Medication Sig Dispense Refill  . apixaban (ELIQUIS) 5 MG TABS tablet Take 5 mg by mouth 2 (two) times daily.    . Dermatological Products, Misc. (KERASAL FUNGAL NAIL RENEWAL) LIQD Apply 1 application topically daily as needed (to affected nails).    . diphenhydrAMINE (BENADRYL) 25 mg capsule Take 25 mg by mouth at bedtime as needed for allergies or sleep.     Marland Kitchen. dofetilide (TIKOSYN) 125 MCG capsule Take 125 mcg by mouth 2 (two) times daily.    Marland Kitchen. dofetilide (TIKOSYN) 250 MCG capsule Take 125 mcg by mouth 2 (two) times daily.    Marland Kitchen. ibuprofen (ADVIL,MOTRIN) 200 MG tablet Take 200-400 mg by mouth every 6 (six) hours as needed for mild pain or moderate pain.    Marland Kitchen. lisinopril (PRINIVIL,ZESTRIL) 5 MG tablet Take 5 mg by mouth daily.    . magnesium oxide (MAG-OX) 400 MG tablet Take 400 mg by mouth 2 (two) times daily.    . metoprolol tartrate (LOPRESSOR) 25 MG tablet Take 25 mg by mouth 2 (two) times daily.    . psyllium (METAMUCIL) 58.6 %  powder Take by mouth daily after lunch. 1 heaping tablespoonful mixed in 8 oz liquid     No current facility-administered medications for this visit.     Allergies:   Patient has no known allergies.   Social History:  The patient  reports that he has never smoked. He has never used smokeless tobacco. He reports that he drinks about 4.2 oz of alcohol per week . He reports that he  does not use drugs.   Family History:  The patient's family history includes Cancer in his mother; Heart disease in his father; Stroke in his father.    ROS:  Please see the history of present illness.   Otherwise, review of systems is positive for none.   All other systems are reviewed and negative.     PHYSICAL EXAM: VS:  BP 133/77 (BP Location: Right Arm)   Pulse (!) 48   Temp 97.5 F (36.4 C) (Oral)   Ht 5\' 11"  (1.803 m)   Wt 200 lb (90.7 kg)   SpO2 99%   BMI 27.89 kg/m  , BMI Body mass index is 27.89 kg/m. GEN: Well nourished, well developed, in no acute distress  HEENT: normal  Neck: no JVD, carotid bruits, or masses Cardiac: RRR; no murmurs, rubs, or gallops,no edema  Respiratory:  clear to auscultation bilaterally, normal work of breathing GI: soft, nontender, nondistended, + BS MS: no deformity or atrophy  Skin: warm and dry,  Neuro:  Strength and sensation are intact Psych: euthymic mood, full affect   Lipid Panel  Labs(Brief)  No results found for: CHOL, TRIG, HDL, CHOLHDL, VLDL, LDLCALC, LDLDIRECT        Wt Readings from Last 3 Encounters:  09/11/16 200 lb 6.4 oz (90.9 kg)  02/09/16 195 lb 1.6 oz (88.5 kg)  01/28/16 199 lb (90.3 kg)      Other studies Reviewed: Additional studies/ records that were reviewed today include: TTE 03/02/16  Review of the above records today demonstrates:  Mild concentric LVH. EF 68%. Moderately dilated left atrium. Mild tricuspid regurgitation without evidence of pulmonary hypertension.   ASSESSMENT AND PLAN:  1. Persistent atrial fibrillation: Currently on Eliquis and dofetilide. Presents today for ablation. Risks and benefits discussed. Risks include but not limited to bleeding, tamponade, heart block, stroke, and damage to sourouding organs. He understands the risks and has agreed to the procedure.  This patients CHA2DS2-VASc Score and unadjusted Ischemic Stroke Rate (% per year) is equal to 2.2 % stroke  rate/year from a score of 2  Above score calculated as 1 point each if present [CHF, HTN, DM, Vascular=MI/PAD/Aortic Plaque, Age if 65-74, or Male] Above score calculated as 2 points each if present [Age > 75, or Stroke/TIA/TE]     2. Hypertension: well controlled today   Signed, Fisher Hargadon Jorja Loa, MD

## 2016-11-28 NOTE — Discharge Instructions (Signed)
No driving for 4 days. No lifting over 5 lbs for 1 week. No vigorous or sexual activity for 1 week. You may return to work on 12/05/16. Keep procedure site clean & dry. If you notice increased pain, swelling, bleeding or pus, call/return!  You may shower, but no soaking baths/hot tubs/pools for 1 week.  ° ° °You have an appointment set up with the Atrial Fibrillation Clinic.  Multiple studies have shown that being followed by a dedicated atrial fibrillation clinic in addition to the standard care you receive from your other physicians improves health. We believe that enrollment in the atrial fibrillation clinic will allow us to better care for you.  ° °The phone number to the Atrial Fibrillation Clinic is [phone]. The clinic is staffed Monday through Friday from 8:30am to 5pm. ° °Parking Directions: The clinic is located in the Heart and Vascular Building connected to [place] [facility]. °1)From [address] turn on to [address] and go to the 3rd entrance  (Heart and Vascular entrance) on the right. °2)Look to the right for Heart &Vascular Parking Garage. °3)A code for the entrance is required please call the clinic to receive this.   °4)Take the elevators to the 1st floor. Registration is in the room with the glass walls at the end of the hallway. ° °If you have any trouble parking or locating the clinic, please don’t hesitate to call [phone]. °

## 2016-11-28 NOTE — Progress Notes (Signed)
Site area: 2 lt fv sheaths Site Prior to Removal:  Level 0 Pressure Applied For:  20 minutes Manual:   yes Patient Status During Pull:  stable Post Pull Site:  Level  0 Post Pull Instructions Given:  yes Post Pull Pulses Present: palpable Dressing Applied:  Gauze and tegaderm Bedrest begins @ 1550 Comments:  IV saline locked   

## 2016-11-28 NOTE — Anesthesia Postprocedure Evaluation (Signed)
Anesthesia Post Note  Patient: Gregory ArbourDouglas E Lusignan  Procedure(s) Performed: Procedure(s) (LRB): Atrial Fibrillation Ablation (N/A)     Patient location during evaluation: PACU Anesthesia Type: General Level of consciousness: awake and alert Pain management: pain level controlled Vital Signs Assessment: post-procedure vital signs reviewed and stable Respiratory status: spontaneous breathing, nonlabored ventilation, respiratory function stable and patient connected to nasal cannula oxygen Cardiovascular status: blood pressure returned to baseline and stable Postop Assessment: no signs of nausea or vomiting Anesthetic complications: no    Last Vitals:  Vitals:   11/28/16 1550 11/28/16 1554  BP: 128/66   Pulse: 65   Resp: 17   Temp:  36.2 C    Last Pain:  Vitals:   11/28/16 1554  TempSrc: Temporal                 Chan Sheahan DAVID

## 2016-11-28 NOTE — Anesthesia Procedure Notes (Signed)
Procedure Name: Intubation Date/Time: 11/28/2016 11:22 AM Performed by: Lovie CholOCK, Cynthie Garmon K Pre-anesthesia Checklist: Patient identified, Emergency Drugs available, Suction available and Patient being monitored Patient Re-evaluated:Patient Re-evaluated prior to inductionOxygen Delivery Method: Circle System Utilized Preoxygenation: Pre-oxygenation with 100% oxygen Intubation Type: IV induction Ventilation: Mask ventilation without difficulty Laryngoscope Size: Miller and 3 Grade View: Grade I Tube type: Oral Tube size: 7.5 mm Number of attempts: 1 Airway Equipment and Method: Stylet and Oral airway Placement Confirmation: ETT inserted through vocal cords under direct vision,  positive ETCO2 and breath sounds checked- equal and bilateral Secured at: 21 cm Tube secured with: Tape Dental Injury: Teeth and Oropharynx as per pre-operative assessment

## 2016-11-28 NOTE — Progress Notes (Signed)
Site area: 2 rt fv sheaths Site Prior to Removal:  Level 0 Pressure Applied For:  20 minutes Manual:   yes Patient Status During Pull:  stable Post Pull Site:  Level  0 Post Pull Instructions Given:  yes Post Pull Pulses Present: palpable Dressing Applied:  Gauze and tegaderm Bedrest begins @  Comments:

## 2016-11-28 NOTE — Progress Notes (Signed)
Patients wife came to the nurses station to report patient bleeding from groin site. Arrived in room[facility] bleeding from right and left groin sites, dressings saturated. Removed dressings bilateral and pressure held for 10 minutes. No further bleeding. Cleaned up area and applied gauze and tegaderm dressings to both groins with no further bleeding noted. Reviewed post sheath pull instructions with patient and wife with verbalized understanding

## 2016-11-28 NOTE — Progress Notes (Signed)
Pt arrived from cath lab. Pt placed on heart monitor and vital signs taken. Pt wife at bedside. Pt is alert and oriented with no complaints of pain.  Right and left femoral Sites assessed by staff nurse. Pt started on bed rest and educated on the importance of bed rest. Will continue to monitor.

## 2016-11-28 NOTE — Transfer of Care (Signed)
Immediate Anesthesia Transfer of Care Note  Patient: Gregory [name]  Procedure(s) Performed: Procedure(s): Atrial Fibrillation Ablation (N/A)  Patient Location: PACU  Anesthesia Type:General  Level of Consciousness: oriented, sedated and patient cooperative  Airway & Oxygen Therapy: Patient Spontanous Breathing and Patient connected to nasal cannula oxygen  Post-op Assessment: Report given to RN and Post -op Vital signs reviewed and stable  Post vital signs: Reviewed  Last Vitals:  Vitals:   11/28/16 0931 11/28/16 1453  BP: 133/77   Pulse: (!) 48   Temp: 36.4 C 36.6 C    Last Pain:  Vitals:   11/28/16 1453  TempSrc: Temporal         Complications: No apparent anesthesia complications

## 2016-11-28 NOTE — Care Management Note (Signed)
Case Management Note  Patient Details  Name: Edd ArbourDouglas E Campo MRN: [id number] Date of Birth: Nov 04, 1940  Subjective/Objective:   From home with wife, pta indep, s/p afib ablation, he is already on eliquis and tykison.    PCP Benedetto GoadFred Wilson                 Action/Plan: NCM will follow for dc needs.  Expected Discharge Date:                  Expected Discharge Plan:  Home/Self Care  In-House Referral:     Discharge planning Services  CM Consult  Post Acute Care Choice:    Choice offered to:     DME Arranged:    DME Agency:     HH Arranged:    HH Agency:     Status of Service:  Completed, signed off  If discussed at [employer] Length of Stay Meetings, dates discussed:    Additional Comments:  Leone Havenaylor, Willo Yoon [place], RN 11/28/2016, 4:31 PM

## 2016-11-28 NOTE — Anesthesia Preprocedure Evaluation (Addendum)
Anesthesia Evaluation  Patient identified by MRN, date of birth, ID band Patient awake    Reviewed: Allergy & Precautions, NPO status , Patient's Chart, lab work & pertinent test results, reviewed documented beta blocker date and time   Airway Mallampati: I  TM Distance: >3 FB Neck ROM: Full    Dental  (+) Teeth Intact, Dental Advisory Given   Pulmonary    Pulmonary exam normal breath sounds clear to auscultation       Cardiovascular hypertension, Pt. on medications and Pt. on home beta blockers Normal cardiovascular exam+ dysrhythmias Atrial Fibrillation  Rhythm:Regular Rate:Bradycardia     Neuro/Psych negative neurological ROS  negative psych ROS   GI/Hepatic   Endo/Other    Renal/GU Renal InsufficiencyRenal disease     Musculoskeletal   Abdominal   Peds  Hematology   Anesthesia Other Findings   Reproductive/Obstetrics                            Anesthesia Physical Anesthesia Plan  ASA: III  Anesthesia Plan: General   Post-op Pain Management:    Induction: Intravenous  PONV Risk Score and Plan: 2 and Ondansetron and Dexamethasone  Airway Management Planned: Oral ETT  Additional Equipment:   Intra-op Plan:   Post-operative Plan: Extubation in OR  Informed Consent: I have reviewed the patients History and Physical, chart, labs and discussed the procedure including the risks, benefits and alternatives for the proposed anesthesia with the patient or authorized representative who has indicated his/her understanding and acceptance.     Plan Discussed with: CRNA and Surgeon  Anesthesia Plan Comments:         Anesthesia Quick Evaluation

## 2016-11-29 DIAGNOSIS — I481 Persistent atrial fibrillation: Secondary | ICD-10-CM | POA: Diagnosis not present

## 2016-11-29 DIAGNOSIS — M1711 Unilateral primary osteoarthritis, right knee: Secondary | ICD-10-CM | POA: Diagnosis not present

## 2016-11-29 DIAGNOSIS — I10 Essential (primary) hypertension: Secondary | ICD-10-CM | POA: Diagnosis not present

## 2016-11-29 DIAGNOSIS — I480 Paroxysmal atrial fibrillation: Secondary | ICD-10-CM | POA: Diagnosis not present

## 2016-11-29 DIAGNOSIS — Z7901 Long term (current) use of anticoagulants: Secondary | ICD-10-CM | POA: Diagnosis not present

## 2016-11-29 NOTE — Discharge Summary (Signed)
Discharge Summary    Patient ID: Gregory [name],  MRN: [id number], DOB/AGE: [birth year] 76 y.o.  Admit date: 11/28/2016 Discharge date: 11/29/2016  Primary Care Provider: [name] Primary Cardiologist: Dr. Patience Musca  Discharge Diagnoses    Active Problems:   AF (atrial fibrillation) (HCC)   Allergies No Known Allergies  Diagnostic Studies/Procedures    PROCEDURES: 1. Comprehensive electrophysiologic study. 2. Coronary sinus pacing and recording. 3. Three-dimensional mapping of atrial fibrillation 4. Ablation of atrial fibrillation 5. Intracardiac echocardiography. 6. Transseptal puncture of an intact septum. 7. Arrhythmia induction with pacing with dobutamine infusion  CONCLUSIONS: 1. Sinus rhythm upon presentation.   2. Successful electrical isolation and anatomical encircling of all four pulmonary veins with radiofrequency current. 3. No inducible arrhythmias following ablation both on and off of dobutamine 4. No early apparent complications.    History of Present Illness     Gregory [name] a 76 y.o.malewith history of HTN, atrial fibrillation and presents for ablation.   He has had multiple cardioversions and has since been put on dofetilide. He was previously on flecainide. His symptoms with atrial fibrillation or dyspnea and fatigue.  Hospital Course     Consultants: None  The patient underwent successful afib ablation. Patient had mild groin bleeding past procedure. No overnight event. No arrhthymias. Ambulate well.   The patient has been seen by Dr. Ladona Ridgel today and deemed ready for discharge home. All follow-up appointments have been scheduled. Discharge medications are listed below.   Discharge Vitals Blood pressure 113/64, pulse 67, temperature 98.3 F (36.8 C), temperature source Oral, resp. rate 18, height 5\' 11"  (1.803 m), weight 201 lb 1 oz (91.2 kg), SpO2 95 %.  Filed Weights   11/28/16 0931 11/29/16 0228  Weight: 200  lb (90.7 kg) 201 lb 1 oz (91.2 kg)   Physical Exam GEN: Well nourished, well developed, in no acute distress  HEENT: normal  Neck: no JVD, carotid bruits, or masses Cardiac: RRR; no murmurs, rubs, or gallops,no edema. Left and right groin site without hematoma Respiratory:  clear to auscultation bilaterally, normal work of breathing GI: soft, nontender, nondistended, + BS MS: no deformity or atrophy  Skin: warm and dry,  Neuro:  Strength and sensation are intact Psych: euthymic mood, full affect  Labs & Radiologic Studies     CBC No results for input(s): WBC, NEUTROABS, HGB, HCT, MCV, PLT in the last 72 hours. Basic Metabolic Panel  Recent Labs  11/28/16 1705  NA 139  K 4.1  CL 109  CO2 22  GLUCOSE 183*  BUN 26*  CREATININE 1.42*  CALCIUM 8.5*  MG 2.1    Ct Cardiac Morph/pulm Vein W/cm&w/o Ca Score  Addendum Date: 11/24/2016   ADDENDUM REPORT: 11/24/2016 11:39 CLINICAL DATA:  Atrial fibrillation scheduled for an ablation. EXAM: Cardiac CT/CTA TECHNIQUE: The patient was scanned on a Siemens Somatom scanner. FINDINGS: A 120 kV prospective scan was triggered in the descending thoracic aorta at 111 HU's. Gantry rotation speed was 280 msecs and collimation was .9 mm. No beta blockade and no NTG was given. The 3D data set was reconstructed in 5% intervals of the 60-80 % of the R-R cycle. Diastolic phases were analyzed on a dedicated work station using MPR, MIP and VRT modes. The patient received 80 cc of contrast. There is normal pulmonary vein drainage into the left atrium (2 on the right and 2 on the left) with ostial measurements as follows: RUPV:  25 x 24 mm RLPV:  22 x 20 mm LUPV:  23 x 15 mm LLPV:  19 x 16 mm The left atrial appendage is large with chicken wing morphology and 1 major lobe. Ostial size 30 x 19 mm and length 42 mm. There is no thrombus in the left atrial appendage. The esophagus runs to the left from the left trial midline and is in the proximity to the LUPV and  LLPV. Aorta:  Normal caliber.  No dissection or calcifications. Aortic Valve:  Trileaflet.  No calcifications. Coronary Arteries: Normal coronary origin. Right dominance. The study was performed without use of NTG and insufficient for plaque evaluation, however there are calcifications in LAD and RCA arteries. IMPRESSION: 1. There is normal pulmonary vein drainage into the left atrium with no evidence for pulmonary vein stenosis. 2. The left atrial appendage is large with chicken wing morphology and 1 major lobe. Ostial size 30 x 19 mm and length 42 mm. There is no thrombus in the left atrial appendage. 3. The esophagus runs to the left from the left trial midline and is in the proximity to the LUPV and LLPV. 4. Normal coronary origin. Right dominance. The study was performed without use of NTG and insufficient for plaque evaluation, however there are calcifications in LAD and RCA arteries. If clinically indicated consider a stress test. Tobias AlexanderKatarina Nelson Electronically Signed   By: Tobias AlexanderKatarina  Nelson   On: 11/24/2016 11:39   Result Date: 11/24/2016 EXAM: OVER-READ INTERPRETATION  CT CHEST The following report is an over-read performed by radiologist Dr. Royal Piedraaniel Entrikinof [facility] on 11/24/2016. This over-read does not include interpretation of cardiac or coronary anatomy or pathology. The coronary calcium score/coronary CTA interpretation by the cardiologist is attached. COMPARISON:  None. 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 The contrast opacification of the pulmonary arterial system is sub optimal on this examination (as it was timed for opacification of the coronary arteries and thoracic aorta). With this limitation in mind, there is a central filling defect in this aneurysmally dilated left lower lobe pulmonary artery branch which appears to extend beyond a  bifurcation into an adjacent left lower lobe subsegmental sized pulmonary artery branch, best appreciated on axial images 318-320 of series 11. A few scattered tiny pulmonary nodules measuring 4 mm or less in size are noted throughout the lungs bilaterally, nonspecific. Within the visualized portions of the thorax there is no confluent consolidative airspace disease, no pleural effusions, no pneumothorax and no lymphadenopathy. Several densely calcified mediastinal lymph nodes are incidentally noted. Visualized portions of the upper abdomen are unremarkable. There are no aggressive appearing lytic or blastic lesions noted in the visualized portions of the skeleton. IMPRESSION: 1. 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 Notably, there is a potential filling defect within this aneurysmally dilated segment of the pulmonary artery, as discussed above. Given the limitations of today's examination, this is favored to be a flow related artifact, however, if there is clinical concern for pulmonary embolism, further evaluation with PE protocol CT scan could be considered. 2. No follow-up needed if patient is low-risk (and has no known or suspected primary neoplasm). Non-contrast chest CT can be considered in 12 months if patient is high-risk. This recommendation follows the consensus statement: Guidelines for Management of Incidental Pulmonary Nodules Detected on CT Images: From the Fleischner Society 2017; Radiology 2017; 284:228-243. Electronically Signed:  By: Trudie Reed M.D. On: 11/24/2016 11:03   Xr Knee 3 View Left  Result Date: 10/31/2016 No acute abnormalities   Disposition   Pt is being discharged home today in good condition.  Follow-up Plans & Appointments    Follow-up Information    [facility] Follow up on 01/05/2017.   Specialty:  Cardiology Why:  9:00AM Contact information: [address] [id number] mc [address] [place]  [address] [phone]       Regan Lemming, MD Follow up on 03/04/2017.   Specialty:  Cardiology Why:  9:00AM Contact information: [address] [place] [address] [phone]          Discharge Instructions    Diet - low sodium heart healthy    Complete by:  As directed    Discharge instructions    Complete by:  As directed    No driving for 48 hours. No lifting over 5 lbs for 1 week. No sexual activity for 1 week. Keep procedure site clean & dry. If you notice increased pain, swelling, bleeding or pus, call/return!  You may shower, but no soaking baths/hot tubs/pools for 1 week.   Increase activity slowly    Complete by:  As directed       Discharge Medications   Current Discharge Medication List    CONTINUE these medications which have NOT CHANGED   Details  apixaban (ELIQUIS) 5 MG TABS tablet Take 5 mg by mouth 2 (two) times daily.    diphenhydrAMINE (BENADRYL) 25 mg capsule Take 25 mg by mouth at bedtime as needed for allergies or sleep.     !! dofetilide (TIKOSYN) 125 MCG capsule Take 125 mcg by mouth 2 (two) times daily.    !! dofetilide (TIKOSYN) 250 MCG capsule Take 250 mcg by mouth 2 (two) times daily.     ibuprofen (ADVIL,MOTRIN) 200 MG tablet Take 200-400 mg by mouth every 6 (six) hours as needed for mild pain or moderate pain.    lisinopril (PRINIVIL,ZESTRIL) 5 MG tablet Take 5 mg by mouth daily.    magnesium oxide (MAG-OX) 400 MG tablet Take 400 mg by mouth 2 (two) times daily.    metoprolol tartrate (LOPRESSOR) 50 MG tablet Take 50 mg by mouth 2 (two) times daily.     psyllium (METAMUCIL) 58.6 % powder Take by mouth daily after lunch. 1 heaping tablespoonful mixed in 8 oz liquid     !! - Potential duplicate medications found. Please discuss with provider.        Outstanding Labs/Studies   None  Duration of Discharge Encounter   Greater than 30 minutes including physician time.  Signed, Bhagat,Bhavinkumar PA-C 11/29/2016,  8:28 AM  EP attending  Patient seen and examined. Agree with the findings as noted above by cardiology PA. The patient is doing well status post A. fib ablation and is stable for discharge home. Usual follow-up. He will continue his preprocedure medications.  Lewayne Bunting, M.D.

## 2016-12-01 ENCOUNTER — Telehealth: Payer: Self-pay | Admitting: Cardiology

## 2016-12-01 ENCOUNTER — Encounter (HOSPITAL_COMMUNITY): Payer: Self-pay | Admitting: Cardiology

## 2016-12-01 NOTE — Telephone Encounter (Signed)
Pt had an ablation on 11/29/17 pt states that this morning he started to feel like a sting sensation on his groin puncture  site pain score "2" pt states that it come and goes. Pt denies swelling drainage or discoloration at the site. Pt said that he took ibuprofen about 8:00 am this morning with out relief. Pt was made aware that he is taking a blood thinner  Eliquis, and he needs to stay away from NSAID medications. Pt was suggested to take Tylenol instead. Pt is aware to call if his pain gets worse and has other symptoms. Pt verbalized understanding.

## 2016-12-01 NOTE — Telephone Encounter (Signed)
New message      Pt had an ablation last Friday.  He states that he is having pain at the catheter site.  Please advise

## 2016-12-02 LAB — POCT ACTIVATED CLOTTING TIME
ACTIVATED CLOTTING TIME: 301 s
ACTIVATED CLOTTING TIME: 323 s
Activated Clotting Time: 263 seconds

## 2016-12-04 ENCOUNTER — Telehealth: Payer: Self-pay | Admitting: Cardiology

## 2016-12-04 NOTE — Telephone Encounter (Signed)
New message      Pt had an ablation last Friday.  He states that he is out of rhythm again. Please call

## 2016-12-04 NOTE — Telephone Encounter (Signed)
Reports being out of rhythm yesterday.  States is happened around 5 yesterday and then settled down.  Denies any symptoms with occurrence except for racing heart. When it first starts it is more extreme and then it settles down. Pt is going to continue to monitor and call back if he stays out of rhythm for extended time and/or symptoms begin. Patient verbalized understanding and agreeable to plan.

## 2016-12-09 ENCOUNTER — Ambulatory Visit: Payer: Medicare Other | Admitting: Cardiology

## 2016-12-15 DIAGNOSIS — Z9889 Other specified postprocedural states: Secondary | ICD-10-CM | POA: Diagnosis not present

## 2016-12-15 DIAGNOSIS — N183 Chronic kidney disease, stage 3 (moderate): Secondary | ICD-10-CM | POA: Diagnosis not present

## 2016-12-15 DIAGNOSIS — I480 Paroxysmal atrial fibrillation: Secondary | ICD-10-CM | POA: Diagnosis not present

## 2017-01-05 ENCOUNTER — Other Ambulatory Visit: Payer: Self-pay

## 2017-01-05 ENCOUNTER — Encounter (HOSPITAL_COMMUNITY): Payer: Self-pay | Admitting: Nurse Practitioner

## 2017-01-05 ENCOUNTER — Ambulatory Visit (HOSPITAL_COMMUNITY)
Admission: RE | Admit: 2017-01-05 | Discharge: 2017-01-05 | Disposition: A | Discharge status: Home / Self Care | Payer: Medicare Other | Source: Ambulatory Visit | Attending: Nurse Practitioner | Admitting: Nurse Practitioner

## 2017-01-05 VITALS — BP 122/78 | HR 51 | Ht 71.0 in | Wt 197.0 lb

## 2017-01-05 DIAGNOSIS — Z9889 Other specified postprocedural states: Secondary | ICD-10-CM | POA: Diagnosis not present

## 2017-01-05 DIAGNOSIS — Z823 Family history of stroke: Secondary | ICD-10-CM | POA: Diagnosis not present

## 2017-01-05 DIAGNOSIS — Z9841 Cataract extraction status, right eye: Secondary | ICD-10-CM | POA: Diagnosis not present

## 2017-01-05 DIAGNOSIS — Z79899 Other long term (current) drug therapy: Secondary | ICD-10-CM | POA: Insufficient documentation

## 2017-01-05 DIAGNOSIS — Z7901 Long term (current) use of anticoagulants: Secondary | ICD-10-CM | POA: Diagnosis not present

## 2017-01-05 DIAGNOSIS — Z8052 Family history of malignant neoplasm of bladder: Secondary | ICD-10-CM | POA: Insufficient documentation

## 2017-01-05 DIAGNOSIS — Z800 Family history of malignant neoplasm of digestive organs: Secondary | ICD-10-CM | POA: Insufficient documentation

## 2017-01-05 DIAGNOSIS — K648 Other hemorrhoids: Secondary | ICD-10-CM | POA: Diagnosis not present

## 2017-01-05 DIAGNOSIS — Z8249 Family history of ischemic heart disease and other diseases of the circulatory system: Secondary | ICD-10-CM | POA: Diagnosis not present

## 2017-01-05 DIAGNOSIS — I482 Chronic atrial fibrillation: Secondary | ICD-10-CM | POA: Diagnosis not present

## 2017-01-05 DIAGNOSIS — I480 Paroxysmal atrial fibrillation: Secondary | ICD-10-CM | POA: Diagnosis not present

## 2017-01-05 NOTE — Progress Notes (Signed)
Primary Care Physician: [name], MD Referring Physician:Dr. Jacobe Study is a 76 y.o. male with a h/o paroxysmal afib that had ablation 6/15 with Dr. Elberta Fortis and is in the afib clinic for f/u. He reports no issues with swallowing or groin problems. He still notices afib intermittently, some episodes around 24 hours. He continues on tikosyn and apixaban. No missed doses of apixaban.  Today, he denies symptoms of chest pain, shortness of breath, orthopnea, PND, lower extremity edema, dizziness, presyncope, syncope, or neurologic sequela. The patient is tolerating medications without difficulties and is otherwise without complaint today.   Past Medical History:  Diagnosis Date  . Arthritis    "right knee" (01/28/2016)  . CAP (community acquired pneumonia) 2013  . Chronic atrial fibrillation (HCC)   . Diverticulosis of colon (without mention of hemorrhage)   . Dysrhythmia   . High blood pressure   . Internal hemorrhoids without mention of complication   . Personal history of colonic polyps 01/17/2002   adenomatous, tublar adenoma 02/10/2007   Past Surgical History:  Procedure Laterality Date  . ATRIAL FIBRILLATION ABLATION  11/28/2016  . ATRIAL FIBRILLATION ABLATION N/A 11/28/2016   Procedure: Atrial Fibrillation Ablation;  Surgeon: Regan Lemming, MD;  Location: [facility] INVASIVE CV LAB;  Service: Cardiovascular;  Laterality: N/A;  . CARDIOVERSION  05/03/2012   Procedure: CARDIOVERSION;  Surgeon: [name], MD;  Location: [facility] ENDOSCOPY;  Service: Cardiovascular;  Laterality: N/A;  . CARDIOVERSION  06/08/2012   Procedure: CARDIOVERSION;  Surgeon: [name], MD;  Location: [facility] ENDOSCOPY;  Service: Cardiovascular;  Laterality: N/A;  Lawrence Marseilles /janie  . CARDIOVERSION N/A 10/28/2013   Procedure: CARDIOVERSION;  Surgeon: [name], MD;  Location: [facility] ENDOSCOPY;  Service: Cardiovascular;  Laterality: N/A;  . CARDIOVERSION N/A 06/30/2014   Procedure:  CARDIOVERSION;  Surgeon: [name], MD;  Location: [facility] ENDOSCOPY;  Service: Cardiovascular;  Laterality: N/A;  H&P in file  . CARDIOVERSION N/A 09/05/2014   Procedure: CARDIOVERSION;  Surgeon: [name], MD;  Location: [facility] ENDOSCOPY;  Service: Cardiovascular;  Laterality: N/A;  . CARDIOVERSION N/A 12/24/2015   Procedure: CARDIOVERSION;  Surgeon: [name], MD;  Location: [facility] ENDOSCOPY;  Service: Cardiovascular;  Laterality: N/A;  . CARDIOVERSION N/A 01/30/2016   Procedure: CARDIOVERSION;  Surgeon: [name], MD;  Location: [facility] ENDOSCOPY;  Service: Cardiovascular;  Laterality: N/A;  . CARDIOVERSION N/A 02/08/2016   Procedure: CARDIOVERSION;  Surgeon: [name], MD;  Location: [facility] ENDOSCOPY;  Service: Cardiovascular;  Laterality: N/A;  . CATARACT EXTRACTION W/ INTRAOCULAR LENS  IMPLANT, BILATERAL Bilateral   . COLONOSCOPY    . INGUINAL HERNIA REPAIR Bilateral    as child  . KNEE ARTHROSCOPY Right 2004    Current Outpatient Prescriptions  Medication Sig Dispense Refill  . apixaban (ELIQUIS) 5 MG TABS tablet Take 5 mg by mouth 2 (two) times daily.    Marland Kitchen dofetilide (TIKOSYN) 125 MCG capsule Take 125 mcg by mouth 2 (two) times daily.    Marland Kitchen dofetilide (TIKOSYN) 250 MCG capsule Take 250 mcg by mouth 2 (two) times daily.     Marland Kitchen ibuprofen (ADVIL,MOTRIN) 200 MG tablet Take 200-400 mg by mouth every 6 (six) hours as needed for mild pain or moderate pain.    Marland Kitchen lisinopril (PRINIVIL,ZESTRIL) 5 MG tablet Take 5 mg by mouth daily.    . magnesium oxide (MAG-OX) 400 MG tablet Take 400 mg by mouth 2 (two) times daily.    . metoprolol tartrate (LOPRESSOR) 50 MG tablet  Take 50 mg by mouth 2 (two) times daily.     . psyllium (METAMUCIL) 58.6 % powder Take by mouth daily after lunch. 1 heaping tablespoonful mixed in 8 oz liquid    . diphenhydrAMINE (BENADRYL) 25 mg capsule Take 25 mg by mouth at bedtime as needed for allergies or sleep.      No current facility-administered medications for this encounter.     No  Known Allergies  Social History   Social History  . Marital status: Married    Spouse name: N/A  . Number of children: 2  . Years of education: N/A   Occupational History  .  [employer]   Social History Main Topics  . Smoking status: Never Smoker  . Smokeless tobacco: Never Used  . Alcohol use 4.2 oz/week    7 Glasses of wine per week  . Drug use: No  . Sexual activity: Not Currently   Other Topics Concern  . Not on file   Social History Narrative  . No narrative on file    Family History  Problem Relation Age of Onset  . Heart disease Father   . Stroke Father   . Cancer Mother        bladder  . Colon cancer Neg Hx   . Stomach cancer Neg Hx     ROS- All systems are reviewed and negative except as per the HPI above  Physical Exam: Vitals:   01/05/17 0901  BP: 122/78  Pulse: (!) 51  Weight: 197 lb (89.4 kg)  Height: 5\' 11"  (1.803 m)   Wt Readings from Last 3 Encounters:  01/05/17 197 lb (89.4 kg)  11/29/16 201 lb 1 oz (91.2 kg)  09/11/16 200 lb 6.4 oz (90.9 kg)    Labs: Lab Results  Component Value Date   NA 139 11/28/2016   K 4.1 11/28/2016   CL 109 11/28/2016   CO2 22 11/28/2016   GLUCOSE 183 (H) 11/28/2016   BUN 26 (H) 11/28/2016   CREATININE 1.42 (H) 11/28/2016   CALCIUM 8.5 (L) 11/28/2016   MG 2.1 11/28/2016   Lab Results  Component Value Date   INR 1.14 03/31/2012   No results found for: CHOL, HDL, LDLCALC, TRIG   GEN- The patient is well appearing, alert and oriented x 3 today.   Head- normocephalic, atraumatic Eyes-  Sclera clear, conjunctiva pink Ears- hearing intact Oropharynx- clear Neck- supple, no JVP Lymph- no cervical lymphadenopathy Lungs- Clear to ausculation bilaterally, normal work of breathing Heart- slow regular rate and rhythm, no murmurs, rubs or gallops, PMI not laterally displaced GI- soft, NT, ND, + BS Extremities- no clubbing, cyanosis, or edema MS- no significant deformity or atrophy Skin- no rash or  lesion Psych- euthymic mood, full affect Neuro- strength and sensation are intact  EKG- Sinus brady at 51 bpm, with first degree AV block, NSSTW abnormality, qrs int 84 ms, qtc 427 ms Epic records reviewed    Assessment and Plan: 1. afib Continues to have paroxysmal afib in a pattern that pt does not see much of an improvement prior to ablation Qtc stable He will continue to take tikosyn Tikosyn labs  checked and in range  6/15 Pt reassured that  afib burden is expected to improve over the next 2 month healing period Continue eliquis for a chadsvasc score of at least 3  F/u with Dr. Elberta Fortisamnitz 9/19 afib clinic as needed  Lupita LeashDonna C. Matthew Folksarroll, ANP-C [facility] [address] [place], [place]  [address] [phone]  

## 2017-01-08 DIAGNOSIS — R972 Elevated prostate specific antigen [PSA]: Secondary | ICD-10-CM | POA: Diagnosis not present

## 2017-01-09 ENCOUNTER — Telehealth (HOSPITAL_COMMUNITY): Payer: Self-pay | Admitting: *Deleted

## 2017-01-09 NOTE — Telephone Encounter (Signed)
Pt wanted to call and have put in his chart that he continues to go in and out of afib. He will be in NSR for few days then revert to afib for few days. Reassured patient since ablation last month this can be expected. His heart rate is not very elevated when in afib. Instructed to call if AFib persistent for more than 72 hours and we can bring in for assessment. Pt verbalized understanding.

## 2017-01-16 DIAGNOSIS — R972 Elevated prostate specific antigen [PSA]: Secondary | ICD-10-CM | POA: Diagnosis not present

## 2017-01-16 DIAGNOSIS — R351 Nocturia: Secondary | ICD-10-CM | POA: Diagnosis not present

## 2017-01-16 DIAGNOSIS — N401 Enlarged prostate with lower urinary tract symptoms: Secondary | ICD-10-CM | POA: Diagnosis not present

## 2017-01-26 DIAGNOSIS — H35372 Puckering of macula, left eye: Secondary | ICD-10-CM | POA: Diagnosis not present

## 2017-03-03 NOTE — Progress Notes (Signed)
Electrophysiology Office Note   Date:  03/04/2017   ID:  Gregory [name], DOB 1941/01/15, MRN [id number]  PCP:  [name], MD  Cardiologist:  Jacinto Halim Primary Electrophysiologist:  Regan Lemming, MD    Chief Complaint  Patient presents with  . Follow-up    PAF     History of Present Illness: Gregory [name] is a 76 y.o. male who presents today for electrophysiology evaluation.   He has history of atrial fibrillation, hypertension. He has had multiple cardioversions and has since been put on dofetilide. He was having breakthrough episodes of atrial fibrillation and had ablation on 11/28/16. Presenting today for follow-up.  Today, denies symptoms of chest pain, shortness of breath, orthopnea, PND, lower extremity edema, claudication, presyncope, syncope, bleeding, or neurologic sequela. The patient is tolerating medications without difficulties. He is continuing to have palpitations. They occur a few weeks apart. He says that he feels fatigued with a pounding in his chest for a few hours, then this goes away. He feels well, but has noted palpitations lasting up to a day and a half.   Past Medical History:  Diagnosis Date  . Arthritis    "right knee" (01/28/2016)  . CAP (community acquired pneumonia) 2013  . Chronic atrial fibrillation (HCC)   . Diverticulosis of colon (without mention of hemorrhage)   . Dysrhythmia   . High blood pressure   . Internal hemorrhoids without mention of complication   . Personal history of colonic polyps 01/17/2002   adenomatous, tublar adenoma 02/10/2007   Past Surgical History:  Procedure Laterality Date  . ATRIAL FIBRILLATION ABLATION  11/28/2016  . ATRIAL FIBRILLATION ABLATION N/A 11/28/2016   Procedure: Atrial Fibrillation Ablation;  Surgeon: Regan Lemming, MD;  Location: [facility] INVASIVE CV LAB;  Service: Cardiovascular;  Laterality: N/A;  . CARDIOVERSION  05/03/2012   Procedure: CARDIOVERSION;  Surgeon: [name], MD;   Location: [facility] ENDOSCOPY;  Service: Cardiovascular;  Laterality: N/A;  . CARDIOVERSION  06/08/2012   Procedure: CARDIOVERSION;  Surgeon: [name], MD;  Location: [facility] ENDOSCOPY;  Service: Cardiovascular;  Laterality: N/A;  Lawrence Marseilles /janie  . CARDIOVERSION N/A 10/28/2013   Procedure: CARDIOVERSION;  Surgeon: [name], MD;  Location: [place] ENDOSCOPY;  Service: Cardiovascular;  Laterality: N/A;  . CARDIOVERSION N/A 06/30/2014   Procedure: CARDIOVERSION;  Surgeon: [name], MD;  Location: [facility] ENDOSCOPY;  Service: Cardiovascular;  Laterality: N/A;  H&P in file  . CARDIOVERSION N/A 09/05/2014   Procedure: CARDIOVERSION;  Surgeon: [name], MD;  Location: [facility] ENDOSCOPY;  Service: Cardiovascular;  Laterality: N/A;  . CARDIOVERSION N/A 12/24/2015   Procedure: CARDIOVERSION;  Surgeon: [name], MD;  Location: [facility] ENDOSCOPY;  Service: Cardiovascular;  Laterality: N/A;  . CARDIOVERSION N/A 01/30/2016   Procedure: CARDIOVERSION;  Surgeon: [name], MD;  Location: [facility] ENDOSCOPY;  Service: Cardiovascular;  Laterality: N/A;  . CARDIOVERSION N/A 02/08/2016   Procedure: CARDIOVERSION;  Surgeon: [name], MD;  Location: [facility] ENDOSCOPY;  Service: Cardiovascular;  Laterality: N/A;  . CATARACT EXTRACTION W/ INTRAOCULAR LENS  IMPLANT, BILATERAL Bilateral   . COLONOSCOPY    . INGUINAL HERNIA REPAIR Bilateral    as child  . KNEE ARTHROSCOPY Right 2004     Current Outpatient Prescriptions  Medication Sig Dispense Refill  . apixaban (ELIQUIS) 5 MG TABS tablet Take 5 mg by mouth 2 (two) times daily.    . diphenhydrAMINE (BENADRYL) 25 mg capsule Take 25 mg by mouth at bedtime as needed for allergies or sleep.     Marland Kitchen  dofetilide (TIKOSYN) 125 MCG capsule Take 125 mcg by mouth 2 (two) times daily.    Marland Kitchen dofetilide (TIKOSYN) 250 MCG capsule Take 250 mcg by mouth 2 (two) times daily.     Marland Kitchen ibuprofen (ADVIL,MOTRIN) 200 MG tablet Take 200-400 mg by mouth every 6 (six) hours as needed for mild pain or moderate pain.     Marland Kitchen lisinopril (PRINIVIL,ZESTRIL) 5 MG tablet Take 5 mg by mouth daily.    . magnesium oxide (MAG-OX) 400 MG tablet Take 400 mg by mouth 2 (two) times daily.    . metoprolol tartrate (LOPRESSOR) 50 MG tablet Take 50 mg by mouth 2 (two) times daily.     . Multiple Vitamin (MULTI-VITAMIN DAILY PO) Take 1 capsule by mouth daily.    . psyllium (METAMUCIL) 58.6 % powder Take by mouth daily after lunch. 1 heaping tablespoonful mixed in 8 oz liquid     No current facility-administered medications for this visit.     Allergies:   Patient has no known allergies.   Social History:  The patient  reports that he has never smoked. He has never used smokeless tobacco. He reports that he drinks about 4.2 oz of alcohol per week . He reports that he does not use drugs.   Family History:  The patient's family history includes Cancer in his mother; Heart disease in his father; Stroke in his father.    ROS:  Please see the history of present illness.   Otherwise, review of systems is positive for palpitations.   All other systems are reviewed and negative.   PHYSICAL EXAM: VS:  BP 110/84   Pulse (!) 55   Ht 5' 10.5" (1.791 m)   Wt 195 lb 6.4 oz (88.6 kg)   SpO2 98%   BMI 27.64 kg/m  , BMI Body mass index is 27.64 kg/m. GEN: Well nourished, well developed, in no acute distress  HEENT: normal  Neck: no JVD, carotid bruits, or masses Cardiac: RRR; no murmurs, rubs, or gallops,no edema  Respiratory:  clear to auscultation bilaterally, normal work of breathing GI: soft, nontender, nondistended, + BS MS: no deformity or atrophy  Skin: warm and dry Neuro:  Strength and sensation are intact Psych: euthymic mood, full affect  EKG:  EKG is ordered today. Personal review of the ekg ordered shows SR, rate 55, 1 degree AV block  Recent Labs: 11/19/2016: Hemoglobin 14.5; Platelets 204 11/28/2016: BUN 26; Creatinine, Ser 1.42; Magnesium 2.1; Potassium 4.1; Sodium 139    Lipid Panel  No results found for:  CHOL, TRIG, HDL, CHOLHDL, VLDL, LDLCALC, LDLDIRECT   Wt Readings from Last 3 Encounters:  03/04/17 195 lb 6.4 oz (88.6 kg)  01/05/17 197 lb (89.4 kg)  11/29/16 201 lb 1 oz (91.2 kg)      Other studies Reviewed: Additional studies/ records that were reviewed today include: TTE 03/02/16  Review of the above records today demonstrates:  Mild concentric LVH. EF 68%. Moderately dilated left atrium. Mild tricuspid regurgitation without evidence of pulmonary hypertension.   ASSESSMENT AND PLAN:  1.  Permanent atrial fibrillation: Currently on Eliquis and Tikosyn.  Had AF ablation 11/28/16. Is continuing to have palpitations. I offered him a monitor to see if we can diagnose the cause of his palpitations. He would prefer to come to clinic when he has palpitations for an EKG for further review. He may benefit from adjusting medications or repeat touchup ablation.  This patients CHA2DS2-VASc Score and unadjusted Ischemic Stroke Rate (% per year) is  equal to 2.2 % stroke rate/year from a score of 2  Above score calculated as 1 point each if present [CHF, HTN, DM, Vascular=MI/PAD/Aortic Plaque, Age if 65-74, or Male] Above score calculated as 2 points each if present [Age > 75, or Stroke/TIA/TE]     2. Hypertension: Well-controlled today. No changes.    Current medicines are reviewed at length with the patient today.   The patient does not have concerns regarding his medicines.  The following changes were made today:  none  Labs/ tests ordered today include:  No orders of the defined types were placed in this encounter.    Disposition:   FU with Kaylea Mounsey 3 months  Signed, Arsenia Goracke Jorja Loa, MD  03/04/2017 9:22 AM     [facility] [address] [place] [address] [phone] (office) [phone] (fax)

## 2017-03-04 ENCOUNTER — Encounter: Payer: Self-pay | Admitting: Cardiology

## 2017-03-04 ENCOUNTER — Ambulatory Visit (INDEPENDENT_AMBULATORY_CARE_PROVIDER_SITE_OTHER): Payer: Medicare Other | Admitting: Cardiology

## 2017-03-04 VITALS — BP 110/84 | HR 55 | Ht 70.5 in | Wt 195.4 lb

## 2017-03-04 DIAGNOSIS — I480 Paroxysmal atrial fibrillation: Secondary | ICD-10-CM

## 2017-03-04 DIAGNOSIS — I10 Essential (primary) hypertension: Secondary | ICD-10-CM | POA: Diagnosis not present

## 2017-03-04 NOTE — Patient Instructions (Signed)
Medication Instructions:  Your physician recommends that you continue on your current medications as directed. Please refer to the Current Medication list given to you today.  If you need a refill on your cardiac medications before your next appointment, please call your pharmacy.   Labwork: None ordered  Testing/Procedures: None ordered  Follow-Up: Your physician recommends that you schedule a follow-up appointment in: 3 months with Dr. Elberta Fortis.  Thank you for choosing [facility]!!   Dory Horn, RN [phone]  Any Other Special Instructions Will Be Listed Below (If Applicable).  If you continue to have palpitations, call the office and we will work you in for an EKG in our office or in the Afib clinic.

## 2017-03-10 ENCOUNTER — Telehealth: Payer: Self-pay | Admitting: Cardiology

## 2017-03-10 NOTE — Telephone Encounter (Signed)
Pt was recommended that if he is having palpitations to call the office to have an EKG done. Pt would like to know if the palpitations happened during the weekend is he going to be able to come to have the EKG done. Pt was made aware that the office is closed during the weekends. Pt is aware that he will be able to get the on call person, but the office is open Monday to Friday. Pt verbalized understanding.

## 2017-03-10 NOTE — Telephone Encounter (Signed)
New Message     Patient would like to speak to you about his EKG , would not give any other information. Not having any problem, pt has been having bouts of arrhythmia and was told to call and set up EKG, what is he to do if it happens on the weekend?

## 2017-03-18 ENCOUNTER — Encounter (HOSPITAL_COMMUNITY): Payer: Self-pay | Admitting: Nurse Practitioner

## 2017-03-18 ENCOUNTER — Ambulatory Visit (HOSPITAL_COMMUNITY)
Admission: RE | Admit: 2017-03-18 | Discharge: 2017-03-18 | Disposition: A | Discharge status: Home / Self Care | Payer: Medicare Other | Source: Ambulatory Visit | Attending: Nurse Practitioner | Admitting: Nurse Practitioner

## 2017-03-18 ENCOUNTER — Telehealth: Payer: Self-pay | Admitting: Cardiology

## 2017-03-18 VITALS — BP 108/68 | HR 57 | Ht 70.5 in | Wt 190.6 lb

## 2017-03-18 DIAGNOSIS — Z79899 Other long term (current) drug therapy: Secondary | ICD-10-CM | POA: Diagnosis not present

## 2017-03-18 DIAGNOSIS — I480 Paroxysmal atrial fibrillation: Secondary | ICD-10-CM | POA: Insufficient documentation

## 2017-03-18 DIAGNOSIS — Z9889 Other specified postprocedural states: Secondary | ICD-10-CM | POA: Insufficient documentation

## 2017-03-18 DIAGNOSIS — Z7901 Long term (current) use of anticoagulants: Secondary | ICD-10-CM | POA: Diagnosis not present

## 2017-03-18 DIAGNOSIS — N183 Chronic kidney disease, stage 3 (moderate): Secondary | ICD-10-CM | POA: Diagnosis not present

## 2017-03-18 DIAGNOSIS — I482 Chronic atrial fibrillation: Secondary | ICD-10-CM | POA: Diagnosis not present

## 2017-03-18 DIAGNOSIS — I4891 Unspecified atrial fibrillation: Secondary | ICD-10-CM | POA: Diagnosis present

## 2017-03-18 MED ORDER — DILTIAZEM HCL 30 MG PO TABS: ORAL_TABLET | ORAL | 1 refills | Status: DC

## 2017-03-18 NOTE — Patient Instructions (Signed)
Your physician has recommended you make the following change in your medication:  1)Cardizem 30mg -- take 1 tablet every 4 hours AS NEEDED for afib heart rate over 100 as long as top number of blood pressure over 100.   

## 2017-03-18 NOTE — Progress Notes (Signed)
Primary Care Physician: [name], MD Referring Physician:Dr. Luman [name] is a 76 y.o. male with a h/o paroxysmal afib that had ablation 6/15 with Dr. Elberta Fortis and is in the afib clinic for f/u at the recommendation of Dr. Jacinto Halim as he saw him this am and he was in afib. He converted  after leaving his office. Dr. Jacinto Halim thought he might have to have repeat ablation as the pt is having several episodes a week lasting hours on end. He conitues on tikosyn and apixaban. He tolerates fairly well . No missed doses of apixaban.  Today, he denies symptoms of chest pain, shortness of breath, orthopnea, PND, lower extremity edema, dizziness, presyncope, syncope, or neurologic sequela. The patient is tolerating medications without difficulties and is otherwise without complaint today.   Past Medical History:  Diagnosis Date  . Arthritis    "right knee" (01/28/2016)  . CAP (community acquired pneumonia) 2013  . Chronic atrial fibrillation (HCC)   . Diverticulosis of colon (without mention of hemorrhage)   . Dysrhythmia   . High blood pressure   . Internal hemorrhoids without mention of complication   . Personal history of colonic polyps 01/17/2002   adenomatous, tublar adenoma 02/10/2007   Past Surgical History:  Procedure Laterality Date  . ATRIAL FIBRILLATION ABLATION  11/28/2016  . ATRIAL FIBRILLATION ABLATION N/A 11/28/2016   Procedure: Atrial Fibrillation Ablation;  Surgeon: Regan Lemming, MD;  Location: [facility] INVASIVE CV LAB;  Service: Cardiovascular;  Laterality: N/A;  . CARDIOVERSION  05/03/2012   Procedure: CARDIOVERSION;  Surgeon: [name], MD;  Location: [facility] ENDOSCOPY;  Service: Cardiovascular;  Laterality: N/A;  . CARDIOVERSION  06/08/2012   Procedure: CARDIOVERSION;  Surgeon: [name], MD;  Location: [facility] ENDOSCOPY;  Service: Cardiovascular;  Laterality: N/A;  Lawrence Marseilles /janie  . CARDIOVERSION N/A 10/28/2013   Procedure: CARDIOVERSION;  Surgeon:  [name], MD;  Location: [facility] ENDOSCOPY;  Service: Cardiovascular;  Laterality: N/A;  . CARDIOVERSION N/A 06/30/2014   Procedure: CARDIOVERSION;  Surgeon: [name], MD;  Location: [facility] ENDOSCOPY;  Service: Cardiovascular;  Laterality: N/A;  H&P in file  . CARDIOVERSION N/A 09/05/2014   Procedure: CARDIOVERSION;  Surgeon: [name], MD;  Location: [facility] ENDOSCOPY;  Service: Cardiovascular;  Laterality: N/A;  . CARDIOVERSION N/A 12/24/2015   Procedure: CARDIOVERSION;  Surgeon: [name], MD;  Location: [facility] ENDOSCOPY;  Service: Cardiovascular;  Laterality: N/A;  . CARDIOVERSION N/A 01/30/2016   Procedure: CARDIOVERSION;  Surgeon: [name], MD;  Location: [facility] ENDOSCOPY;  Service: Cardiovascular;  Laterality: N/A;  . CARDIOVERSION N/A 02/08/2016   Procedure: CARDIOVERSION;  Surgeon: [name], MD;  Location: [facility] ENDOSCOPY;  Service: Cardiovascular;  Laterality: N/A;  . CATARACT EXTRACTION W/ INTRAOCULAR LENS  IMPLANT, BILATERAL Bilateral   . COLONOSCOPY    . INGUINAL HERNIA REPAIR Bilateral    as child  . KNEE ARTHROSCOPY Right 2004    Current Outpatient Prescriptions  Medication Sig Dispense Refill  . apixaban (ELIQUIS) 5 MG TABS tablet Take 5 mg by mouth 2 (two) times daily.    Marland Kitchen dofetilide (TIKOSYN) 125 MCG capsule Take 125 mcg by mouth 2 (two) times daily.    Marland Kitchen dofetilide (TIKOSYN) 250 MCG capsule Take 250 mcg by mouth 2 (two) times daily.     Marland Kitchen ibuprofen (ADVIL,MOTRIN) 200 MG tablet Take 200-400 mg by mouth every 6 (six) hours as needed for mild pain or moderate pain.    Marland Kitchen lisinopril (PRINIVIL,ZESTRIL) 5 MG tablet Take 5  mg by mouth daily.    . magnesium oxide (MAG-OX) 400 MG tablet Take 400 mg by mouth 2 (two) times daily.    . metoprolol tartrate (LOPRESSOR) 50 MG tablet Take 50 mg by mouth 2 (two) times daily.     . Multiple Vitamin (MULTI-VITAMIN DAILY PO) Take 1 capsule by mouth daily.    . psyllium (METAMUCIL) 58.6 % powder Take by mouth daily after lunch. 1 heaping  tablespoonful mixed in 8 oz liquid    . diltiazem (CARDIZEM) 30 MG tablet Take 1 tablet every 4 hours AS NEEDED for afib heart rate over 100 45 tablet 1   No current facility-administered medications for this encounter.     No Known Allergies  Social History   Social History  . Marital status: Married    Spouse name: N/A  . Number of children: 2  . Years of education: N/A   Occupational History  .  [employer]   Social History Main Topics  . Smoking status: Never Smoker  . Smokeless tobacco: Never Used  . Alcohol use 4.2 oz/week    7 Glasses of wine per week  . Drug use: No  . Sexual activity: Not Currently   Other Topics Concern  . Not on file   Social History Narrative  . No narrative on file    Family History  Problem Relation Age of Onset  . Heart disease Father   . Stroke Father   . Cancer Mother        bladder  . Colon cancer Neg Hx   . Stomach cancer Neg Hx     ROS- All systems are reviewed and negative except as per the HPI above  Physical Exam: Vitals:   03/18/17 1425  BP: 108/68  Pulse: (!) 57  Weight: 190 lb 9.6 oz (86.5 kg)  Height: 5' 10.5" (1.791 m)   Wt Readings from Last 3 Encounters:  03/18/17 190 lb 9.6 oz (86.5 kg)  03/04/17 195 lb 6.4 oz (88.6 kg)  01/05/17 197 lb (89.4 kg)    Labs: Lab Results  Component Value Date   NA 139 11/28/2016   K 4.1 11/28/2016   CL 109 11/28/2016   CO2 22 11/28/2016   GLUCOSE 183 (H) 11/28/2016   BUN 26 (H) 11/28/2016   CREATININE 1.42 (H) 11/28/2016   CALCIUM 8.5 (L) 11/28/2016   MG 2.1 11/28/2016   Lab Results  Component Value Date   INR 1.14 03/31/2012   No results found for: CHOL, HDL, LDLCALC, TRIG   GEN- The patient is well appearing, alert and oriented x 3 today.   Head- normocephalic, atraumatic Eyes-  Sclera clear, conjunctiva pink Ears- hearing intact Oropharynx- clear Neck- supple, no JVP Lymph- no cervical lymphadenopathy Lungs- Clear to ausculation bilaterally, normal work  of breathing Heart- slow regular rate and rhythm, no murmurs, rubs or gallops, PMI not laterally displaced GI- soft, NT, ND, + BS Extremities- no clubbing, cyanosis, or edema MS- no significant deformity or atrophy Skin- no rash or lesion Psych- euthymic mood, full affect Neuro- strength and sensation are intact  EKG- Sinus brady at 57 bpm, with first degree AV block, NSSTW abnormality, qrs int 80 ms, qtc 399 ms Epic records reviewed    Assessment and Plan: 1. afib Now in SR Continues to have paroxysmal afib in a pattern that pt does not see much of an improvement prior to ablation, several episodes up to 24 hours or longer Qtc stable He will continue to take tikosyn  375 mcg bid, continue metoprolol Continue eliquis for a chadsvasc score of at least 3 Will rx cardizem 30 mg to take with onset of afib as he does not take any additional meds now for break through episodes  Will move up appointment  with Dr. Elberta Fortis from Dec 20  to next 4-6 weeks afib clinic as needed  Lupita Leash C. Matthew Folks [facility] [address] [place], [place] [address] [phone]

## 2017-03-18 NOTE — Telephone Encounter (Signed)
°  New Prob  Pt states he had an episode of arrhythmias last night. States he was seen by Dr. Jacinto Halim this morning. EKG performed. States a copy will be sent to our office.

## 2017-03-18 NOTE — Telephone Encounter (Signed)
Patient has experienced 2 episodes of palpitations since last seeing Dr. Elberta Fortis.  Reports being out of rhythm currently and has been out "about a day". Currently pt denies any symptoms associated w/ AFib. EKG performed at Dr. Verl Dicker office this morning when he was there for a check. (will fax to AFib clinic) Scheduled to see Rudi Coco in the Afib clinic this afternoon to discuss issues/concerns/treatment plan.  Patient verbalized understanding and agreeable to plan.

## 2017-06-04 ENCOUNTER — Telehealth: Payer: Self-pay | Admitting: Cardiology

## 2017-06-04 ENCOUNTER — Encounter: Payer: Self-pay | Admitting: Cardiology

## 2017-06-04 ENCOUNTER — Ambulatory Visit (INDEPENDENT_AMBULATORY_CARE_PROVIDER_SITE_OTHER): Payer: Medicare Other | Admitting: Cardiology

## 2017-06-04 VITALS — BP 118/70 | HR 114 | Ht 71.0 in | Wt 194.8 lb

## 2017-06-04 DIAGNOSIS — I481 Persistent atrial fibrillation: Secondary | ICD-10-CM

## 2017-06-04 DIAGNOSIS — I10 Essential (primary) hypertension: Secondary | ICD-10-CM | POA: Diagnosis not present

## 2017-06-04 DIAGNOSIS — Z79899 Other long term (current) drug therapy: Secondary | ICD-10-CM

## 2017-06-04 DIAGNOSIS — I4819 Other persistent atrial fibrillation: Secondary | ICD-10-CM

## 2017-06-04 LAB — TSH: TSH: 1.69 u[IU]/mL (ref 0.450–4.500)

## 2017-06-04 LAB — HEPATIC FUNCTION PANEL
ALBUMIN: 4.1 g/dL (ref 3.5–4.8)
ALK PHOS: 64 IU/L (ref 39–117)
ALT: 25 IU/L (ref 0–44)
AST: 19 IU/L (ref 0–40)
BILIRUBIN TOTAL: 0.3 mg/dL (ref 0.0–1.2)
Bilirubin, Direct: 0.11 mg/dL (ref 0.00–0.40)
Total Protein: 6.7 g/dL (ref 6.0–8.5)

## 2017-06-04 MED ORDER — AMIODARONE HCL 200 MG PO TABS: ORAL_TABLET | ORAL | 0 refills | Status: DC

## 2017-06-04 MED ORDER — AMIODARONE HCL 200 MG PO TABS: 200.0000 mg | ORAL_TABLET | Freq: Every day | ORAL | 2 refills | Status: DC

## 2017-06-04 NOTE — Progress Notes (Signed)
Electrophysiology Office Note   Date:  06/04/2017   ID:  Gregory [name], DOB Sep 13, 1940, MRN [id number]  PCP:  [name], MD  Cardiologist:  Jacinto Halim Primary Electrophysiologist:  Regan Lemming, MD    Chief Complaint  Patient presents with  . Follow-up    PAF     History of Present Illness: Gregory [name] is a 76 y.o. male who presents today for electrophysiology evaluation.   He has history of atrial fibrillation, hypertension. He has had multiple cardioversions and has since been put on dofetilide. He was having breakthrough episodes of atrial fibrillation and had ablation on 11/28/16. Presenting today for follow-up.  Today, denies symptoms of chest pain, shortness of breath, orthopnea, PND, lower extremity edema, claudication, dizziness, presyncope, syncope, bleeding, or neurologic sequela. The patient is tolerating medications without difficulties.  Since his ablation, he has continued to have palpitations.  His palpitations occur once a week and last for approximately 24 hours.  There are no exacerbating or alleviating factors.  He has not found any pattern to his atrial fibrillation.   Past Medical History:  Diagnosis Date  . Arthritis    "right knee" (01/28/2016)  . CAP (community acquired pneumonia) 2013  . Chronic atrial fibrillation (HCC)   . Diverticulosis of colon (without mention of hemorrhage)   . Dysrhythmia   . High blood pressure   . Internal hemorrhoids without mention of complication   . Personal history of colonic polyps 01/17/2002   adenomatous, tublar adenoma 02/10/2007   Past Surgical History:  Procedure Laterality Date  . ATRIAL FIBRILLATION ABLATION  11/28/2016  . ATRIAL FIBRILLATION ABLATION N/A 11/28/2016   Procedure: Atrial Fibrillation Ablation;  Surgeon: Regan Lemming, MD;  Location: [facility] INVASIVE CV LAB;  Service: Cardiovascular;  Laterality: N/A;  . CARDIOVERSION  05/03/2012   Procedure: CARDIOVERSION;  Surgeon: [name], MD;  Location: [facility] ENDOSCOPY;  Service: Cardiovascular;  Laterality: N/A;  . CARDIOVERSION  06/08/2012   Procedure: CARDIOVERSION;  Surgeon: [name], MD;  Location: [facility] ENDOSCOPY;  Service: Cardiovascular;  Laterality: N/A;  Lawrence Marseilles /janie  . CARDIOVERSION N/A 10/28/2013   Procedure: CARDIOVERSION;  Surgeon: [name], MD;  Location: [facility] ENDOSCOPY;  Service: Cardiovascular;  Laterality: N/A;  . CARDIOVERSION N/A 06/30/2014   Procedure: CARDIOVERSION;  Surgeon: [name], MD;  Location: [facility] ENDOSCOPY;  Service: Cardiovascular;  Laterality: N/A;  H&P in file  . CARDIOVERSION N/A 09/05/2014   Procedure: CARDIOVERSION;  Surgeon: [name], MD;  Location: [facility] ENDOSCOPY;  Service: Cardiovascular;  Laterality: N/A;  . CARDIOVERSION N/A 12/24/2015   Procedure: CARDIOVERSION;  Surgeon: [name], MD;  Location: [facility] ENDOSCOPY;  Service: Cardiovascular;  Laterality: N/A;  . CARDIOVERSION N/A 01/30/2016   Procedure: CARDIOVERSION;  Surgeon: [name], MD;  Location: [facility] ENDOSCOPY;  Service: Cardiovascular;  Laterality: N/A;  . CARDIOVERSION N/A 02/08/2016   Procedure: CARDIOVERSION;  Surgeon: [name], MD;  Location: [facility] ENDOSCOPY;  Service: Cardiovascular;  Laterality: N/A;  . CATARACT EXTRACTION W/ INTRAOCULAR LENS  IMPLANT, BILATERAL Bilateral   . COLONOSCOPY    . INGUINAL HERNIA REPAIR Bilateral    as child  . KNEE ARTHROSCOPY Right 2004     Current Outpatient Medications  Medication Sig Dispense Refill  . apixaban (ELIQUIS) 5 MG TABS tablet Take 5 mg by mouth 2 (two) times daily.    Marland Kitchen diltiazem (CARDIZEM) 30 MG tablet Take 1 tablet every 4 hours AS NEEDED for afib heart rate over 100 45 tablet 1  .  dofetilide (TIKOSYN) 125 MCG capsule Take 125 mcg by mouth 2 (two) times daily.    . dofetilide (TIKOSYN) 250 MCG capsule TakMarland Kitchene 250 mcg by mouth 2 (two) times daily.     Marland Kitchen. ibuprofen (ADVIL,MOTRIN) 200 MG tablet Take 200-400 mg by mouth every 6 (six) hours as needed for mild pain or  moderate pain.    Marland Kitchen. lisinopril (PRINIVIL,ZESTRIL) 5 MG tablet Take 5 mg by mouth daily.    . magnesium oxide (MAG-OX) 400 MG tablet Take 400 mg by mouth 2 (two) times daily.    . metoprolol tartrate (LOPRESSOR) 50 MG tablet Take 50 mg by mouth 2 (two) times daily.      No current facility-administered medications for this visit.     Allergies:   Patient has no known allergies.   Social History:  The patient  reports that  has never smoked. he has never used smokeless tobacco. He reports that he drinks about 4.2 oz of alcohol per week. He reports that he does not use drugs.   Family History:  The patient's family history includes Cancer in his mother; Heart disease in his father; Stroke in his father.   ROS:  Please see the history of present illness.   Otherwise, review of systems is positive for palpitations.   All other systems are reviewed and negative.   PHYSICAL EXAM: VS:  BP 118/70   Pulse (!) 114   Ht 5\' 11"  (1.803 m)   Wt 194 lb 12.8 oz (88.4 kg)   BMI 27.17 kg/m  , BMI Body mass index is 27.17 kg/m. GEN: Well nourished, well developed, in no acute distress  HEENT: normal  Neck: no JVD, carotid bruits, or masses Cardiac: iRRR; no murmurs, rubs, or gallops,no edema  Respiratory:  clear to auscultation bilaterally, normal work of breathing GI: soft, nontender, nondistended, + BS MS: no deformity or atrophy  Skin: warm and dry Neuro:  Strength and sensation are intact Psych: euthymic mood, full affect  EKG:  EKG is ordered today. Personal review of the ekg ordered shows atrial fibrillation, rate 114   Recent Labs: 11/19/2016: Hemoglobin 14.5; Platelets 204 11/28/2016: BUN 26; Creatinine, Ser 1.42; Magnesium 2.1; Potassium 4.1; Sodium 139    Lipid Panel  No results found for: CHOL, TRIG, HDL, CHOLHDL, VLDL, LDLCALC, LDLDIRECT   Wt Readings from Last 3 Encounters:  06/04/17 194 lb 12.8 oz (88.4 kg)  03/18/17 190 lb 9.6 oz (86.5 kg)  03/04/17 195 lb 6.4 oz (88.6 kg)        Other studies Reviewed: Additional studies/ records that were reviewed today include: TTE 03/02/16  Review of the above records today demonstrates:  Mild concentric LVH. EF 68%. Moderately dilated left atrium. Mild tricuspid regurgitation without evidence of pulmonary hypertension.   ASSESSMENT AND PLAN:  1.  Persistent atrial fibrillation: Currently on Eliquis and Tigas and.  Had ablation 11/28/16 with PVI.  He is unfortunately back in atrial fibrillation today.  I discussed with him further options of therapy including adjusting medicines versus ablation.  He would like to avoid ablation at this time.  We Shakaya Bhullar thus stop his Tigas and and start him on amiodarone today we Kristyl Athens see him back in 3 months for further discussion of possible ablation.  This patients CHA2DS2-VASc Score and unadjusted Ischemic Stroke Rate (% per year) is equal to 3.2 % stroke rate/year from a score of 3  Above score calculated as 1 point each if present [CHF, HTN, DM, Vascular=MI/PAD/Aortic Plaque, Age if  65-74, or Male] Above score calculated as 2 points each if present [Age > 75, or Stroke/TIA/TE]  2. Hypertension: Well-controlled today.    Current medicines are reviewed at length with the patient today.   The patient does not have concerns regarding his medicines.  The following changes were made today: Stop dofetilide and start amiodarone  Labs/ tests ordered today include:  No orders of the defined types were placed in this encounter.    Disposition:   FU with Kratos Ruscitti 3 months  Signed, Zhaire Locker Jorja LoaMartin Vicktoria Muckey, MD  06/04/2017 8:43 AM     [facility] [address] [place] [address] [phone] (office) [phone] (fax)

## 2017-06-04 NOTE — Telephone Encounter (Signed)
Informed pt that we are aware of his kidney function.  Ok to begin Amiodarone per Dr. Elberta Fortisamnitz. Patient verbalized understanding and agreeable to plan.

## 2017-06-04 NOTE — Telephone Encounter (Signed)
Pt c/o medication issue:  1. Name of Medication: amiodarone 200 mg   2. How are you currently taking this medication (dosage and times per day)?  3. Are you having a reaction (difficulty breathing--STAT)? no  4. What is your medication issue? 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 " Patient would like to discuss this before starting the new medication.

## 2017-06-04 NOTE — Patient Instructions (Addendum)
Medication Instructions:  Your physician has recommended you make the following change in your medication:  1. STOP Tikosyn 2. START Amiodarone -- START THIS MEDICATION ON THE EVENING OF SATURDAY 06/06/17.  - take 2 tablets (400 mg total) twice a day for 2 weeks then,  - take 2 tablets (400 mg total) once a day for 2 weeks then,   - take 1 tablet (200 mg total) once a day  * If you need a refill on your cardiac medications before your next appointment, please call your pharmacy. *  Labwork: Today: TSH & LFTs  Testing/Procedures: None ordered  Follow-Up: Your physician recommends that you schedule a follow-up appointment in: 3 months with Dr. Elberta Fortis.  Thank you for choosing [facility]!!   Dory Horn, RN [phone]  Any Other Special Instructions Will Be Listed Below (If Applicable).  Amiodarone tablets What is this medicine? AMIODARONE (a MEE oh da rone) is an antiarrhythmic drug. It helps make your heart beat regularly. Because of the side effects caused by this medicine, it is only used when other medicines have not worked. It is usually used for heartbeat problems that may be life threatening. This medicine may be used for other purposes; ask your health care provider or pharmacist if you have questions. COMMON BRAND NAME(S): Cordarone, Pacerone What should I tell my health care provider before I take this medicine? They need to know if you have any of these conditions: -liver disease -lung disease -other heart problems -thyroid disease -an unusual or allergic reaction to amiodarone, iodine, other medicines, foods, dyes, or preservatives -pregnant or trying to get pregnant -breast-feeding How should I use this medicine? Take this medicine by mouth with a glass of water. Follow the directions on the prescription label. You can take this medicine with or without food. However, you should always take it the same way each time. Take your doses at regular intervals.  Do not take your medicine more often than directed. Do not stop taking except on the advice of your doctor or health care professional. A special MedGuide will be given to you by the pharmacist with each prescription and refill. Be sure to read this information carefully each time. Talk to your pediatrician regarding the use of this medicine in children. Special care may be needed. Overdosage: If you think you have taken too much of this medicine contact a poison control center or emergency room at once. NOTE: This medicine is only for you. Do not share this medicine with others. What if I miss a dose? If you miss a dose, take it as soon as you can. If it is almost time for your next dose, take only that dose. Do not take double or extra doses. What may interact with this medicine? Do not take this medicine with any of the following medications: -abarelix -apomorphine -arsenic trioxide -certain antibiotics like erythromycin, gemifloxacin, levofloxacin, pentamidine -certain medicines for depression like amoxapine, tricyclic antidepressants -certain medicines for fungal infections like fluconazole, itraconazole, ketoconazole, posaconazole, voriconazole -certain medicines for irregular heart beat like disopyramide, dofetilide, dronedarone, ibutilide, propafenone, sotalol -certain medicines for malaria like chloroquine, halofantrine -cisapride -droperidol -haloperidol -hawthorn -maprotiline -methadone -phenothiazines like chlorpromazine, mesoridazine, thioridazine -pimozide -ranolazine -red yeast rice -vardenafil -ziprasidone This medicine may also interact with the following medications: -antiviral medicines for HIV or AIDS -certain medicines for blood pressure, heart disease, irregular heart beat -certain medicines for cholesterol like atorvastatin, cerivastatin, lovastatin, simvastatin -certain medicines for hepatitis C like sofosbuvir and ledipasvir; sofosbuvir -certain medicines  for seizures like phenytoin -certain medicines for thyroid problems -certain medicines that treat or prevent blood clots like warfarin -cholestyramine -cimetidine -clopidogrel -cyclosporine -dextromethorphan -diuretics -fentanyl -general anesthetics -grapefruit juice -lidocaine -loratadine -methotrexate -other medicines that prolong the QT interval (cause an abnormal heart rhythm) -procainamide -quinidine -rifabutin, rifampin, or rifapentine -St. John's Wort -trazodone This list may not describe all possible interactions. Give your health care provider a list of all the medicines, herbs, non-prescription drugs, or dietary supplements you use. Also tell them if you smoke, drink alcohol, or use illegal drugs. Some items may interact with your medicine. What should I watch for while using this medicine? Your condition will be monitored closely when you first begin therapy. Often, this drug is first started in a hospital or other monitored health care setting. Once you are on maintenance therapy, visit your doctor or health care professional for regular checks on your progress. Because your condition and use of this medicine carry some risk, it is a good idea to carry an identification card, necklace or bracelet with details of your condition, medications, and doctor or health care professional. Bonita QuinYou may get drowsy or dizzy. Do not drive, use machinery, or do anything that needs mental alertness until you know how this medicine affects you. Do not stand or sit up quickly, especially if you are an older patient. This reduces the risk of dizzy or fainting spells. This medicine can make you more sensitive to the sun. Keep out of the sun. If you cannot avoid being in the sun, wear protective clothing and use sunscreen. Do not use sun lamps or tanning beds/booths. You should have regular eye exams before and during treatment. Call your doctor if you have blurred vision, see halos, or your eyes become  sensitive to light. Your eyes may get dry. It may be helpful to use a lubricating eye solution or artificial tears solution. If you are going to have surgery or a procedure that requires contrast dyes, tell your doctor or health care professional that you are taking this medicine. What side effects may I notice from receiving this medicine? Side effects that you should report to your doctor or health care professional as soon as possible: -allergic reactions like skin rash, itching or hives, swelling of the face, lips, or tongue -blue-gray coloring of the skin -blurred vision, seeing blue green halos, increased sensitivity of the eyes to light -breathing problems -chest pain -dark urine -fast, irregular heartbeat -feeling faint or light-headed -intolerance to heat or cold -nausea or vomiting -pain and swelling of the scrotum -pain, tingling, numbness in feet, hands -redness, blistering, peeling or loosening of the skin, including inside the mouth -spitting up blood -stomach pain -sweating -unusual or uncontrolled movements of body -unusually weak or tired -weight gain or loss -yellowing of the eyes or skin Side effects that usually do not require medical attention (report to your doctor or health care professional if they continue or are bothersome): -change in sex drive or performance -constipation -dizziness -headache -loss of appetite -trouble sleeping This list may not describe all possible side effects. Call your doctor for medical advice about side effects. You may report side effects to FDA at 1-800-FDA-1088. Where should I keep my medicine? Keep out of the reach of children. Store at room temperature between 20 and 25 degrees C (68 and 77 degrees F). Protect from light. Keep container tightly closed. Throw away any unused medicine after the expiration date. NOTE: This sheet is a summary. It may not cover  all possible information. If you have questions about this medicine,  talk to your doctor, pharmacist, or health care provider.  2018 Elsevier/Gold Standard (2013-09-05 19:48:11)

## 2017-06-04 NOTE — Addendum Note (Signed)
Addended by: Baird LyonsPRICE, Cleta Heatley L on: 06/04/2017 09:03 AM   Modules accepted: Orders

## 2017-06-29 DIAGNOSIS — Z9889 Other specified postprocedural states: Secondary | ICD-10-CM | POA: Diagnosis not present

## 2017-06-29 DIAGNOSIS — I480 Paroxysmal atrial fibrillation: Secondary | ICD-10-CM | POA: Diagnosis not present

## 2017-07-23 DIAGNOSIS — H35372 Puckering of macula, left eye: Secondary | ICD-10-CM | POA: Diagnosis not present

## 2017-09-02 ENCOUNTER — Ambulatory Visit (INDEPENDENT_AMBULATORY_CARE_PROVIDER_SITE_OTHER): Payer: Medicare Other | Admitting: Cardiology

## 2017-09-02 ENCOUNTER — Encounter: Payer: Self-pay | Admitting: Cardiology

## 2017-09-02 VITALS — BP 124/80 | HR 46 | Ht 70.0 in | Wt 201.6 lb

## 2017-09-02 DIAGNOSIS — I10 Essential (primary) hypertension: Secondary | ICD-10-CM | POA: Diagnosis not present

## 2017-09-02 DIAGNOSIS — I481 Persistent atrial fibrillation: Secondary | ICD-10-CM

## 2017-09-02 DIAGNOSIS — I4819 Other persistent atrial fibrillation: Secondary | ICD-10-CM

## 2017-09-02 NOTE — Patient Instructions (Signed)
Medication Instructions:  Your physician recommends that you continue on your current medications as directed. Please refer to the Current Medication list given to you today.  Labwork: None ordered  Testing/Procedures: None ordered  Follow-Up: Your physician wants you to follow-up in: 6 months with Dr. Camnitz.  You will receive a reminder letter in the mail two months in advance. If you don't receive a letter, please call our office to schedule the follow-up appointment.  * If you need a refill on your cardiac medications before your next appointment, please call your pharmacy.   *Please note that any paperwork needing to be filled out by the provider will need to be addressed at the front desk prior to seeing the provider. Please note that any FMLA, disability or other documents regarding health condition is subject to a $25.00 charge that must be received prior to completion of paperwork in the form of a money order or check.  Thank you for choosing [facility]!!   Shailene Demonbreun, RN [phone]        

## 2017-09-02 NOTE — Progress Notes (Signed)
Electrophysiology Office Note   Date:  09/02/2017   ID:  MARJORIE LUSSIER, DOB Jan 22, 1941, MRN [id number]  PCP:  [name], MD  Cardiologist:  Jacinto Halim Primary Electrophysiologist:  Regan Lemming, MD    Chief Complaint  Patient presents with  . Follow-up    Persistent Afib     History of Present Illness: Gregory [name] is a 77 y.o. male who presents today for electrophysiology evaluation.   He has history of atrial fibrillation, hypertension. He has had multiple cardioversions and has since been put on dofetilide. He was having breakthrough episodes of atrial fibrillation and had ablation on 11/28/16.  At his last visit, he presented to clinic in atrial fibrillation.  Dofetilide was stopped and he was started on amiodarone.  Today, denies symptoms of palpitations, chest pain, shortness of breath, orthopnea, PND, lower extremity edema, claudication, dizziness, presyncope, syncope, bleeding, or neurologic sequela. The patient is tolerating medications without difficulties.  Overall he is done well since last being seen.  He was put on amiodarone and has noted no further episodes of atrial fibrillation.  Past Medical History:  Diagnosis Date  . Arthritis    "right knee" (01/28/2016)  . CAP (community acquired pneumonia) 2013  . Chronic atrial fibrillation (HCC)   . Diverticulosis of colon (without mention of hemorrhage)   . Dysrhythmia   . High blood pressure   . Internal hemorrhoids without mention of complication   . Personal history of colonic polyps 01/17/2002   adenomatous, tublar adenoma 02/10/2007   Past Surgical History:  Procedure Laterality Date  . ATRIAL FIBRILLATION ABLATION  11/28/2016  . ATRIAL FIBRILLATION ABLATION N/A 11/28/2016   Procedure: Atrial Fibrillation Ablation;  Surgeon: Regan Lemming, MD;  Location: [facility] INVASIVE CV LAB;  Service: Cardiovascular;  Laterality: N/A;  . CARDIOVERSION  05/03/2012   Procedure: CARDIOVERSION;  Surgeon:  [name], MD;  Location: [facility] ENDOSCOPY;  Service: Cardiovascular;  Laterality: N/A;  . CARDIOVERSION  06/08/2012   Procedure: CARDIOVERSION;  Surgeon: [name], MD;  Location: [facility] ENDOSCOPY;  Service: Cardiovascular;  Laterality: N/A;  Lawrence Marseilles /janie  . CARDIOVERSION N/A 10/28/2013   Procedure: CARDIOVERSION;  Surgeon: [name], MD;  Location: [facility] ENDOSCOPY;  Service: Cardiovascular;  Laterality: N/A;  . CARDIOVERSION N/A 06/30/2014   Procedure: CARDIOVERSION;  Surgeon: [name], MD;  Location: [facility] ENDOSCOPY;  Service: Cardiovascular;  Laterality: N/A;  H&P in file  . CARDIOVERSION N/A 09/05/2014   Procedure: CARDIOVERSION;  Surgeon: [name], MD;  Location: [facility] ENDOSCOPY;  Service: Cardiovascular;  Laterality: N/A;  . CARDIOVERSION N/A 12/24/2015   Procedure: CARDIOVERSION;  Surgeon: [name], MD;  Location: [facility] ENDOSCOPY;  Service: Cardiovascular;  Laterality: N/A;  . CARDIOVERSION N/A 01/30/2016   Procedure: CARDIOVERSION;  Surgeon: [name], MD;  Location: [facility] ENDOSCOPY;  Service: Cardiovascular;  Laterality: N/A;  . CARDIOVERSION N/A 02/08/2016   Procedure: CARDIOVERSION;  Surgeon: [name], MD;  Location: [facility] ENDOSCOPY;  Service: Cardiovascular;  Laterality: N/A;  . CATARACT EXTRACTION W/ INTRAOCULAR LENS  IMPLANT, BILATERAL Bilateral   . COLONOSCOPY    . INGUINAL HERNIA REPAIR Bilateral    as child  . KNEE ARTHROSCOPY Right 2004     Current Outpatient Medications  Medication Sig Dispense Refill  . amiodarone (PACERONE) 200 MG tablet Take 1 tablet (200 mg total) by mouth daily. 90 tablet 2  . apixaban (ELIQUIS) 5 MG TABS tablet Take 5 mg by mouth 2 (two) times daily.    Marland Kitchen ibuprofen (  ADVIL,MOTRIN) 200 MG tablet Take 200-400 mg by mouth every 6 (six) hours as needed for mild pain or moderate pain.    Marland Kitchen lisinopril (PRINIVIL,ZESTRIL) 5 MG tablet Take 5 mg by mouth daily.    . metoprolol tartrate (LOPRESSOR) 25 MG tablet Take 25 mg by mouth 2 (two) times daily.   2   No current facility-administered medications for this visit.     Allergies:   Patient has no known allergies.   Social History:  The patient  reports that  has never smoked. he has never used smokeless tobacco. He reports that he drinks about 4.2 oz of alcohol per week. He reports that he does not use drugs.   Family History:  The patient's family history includes Cancer in his mother; Heart disease in his father; Stroke in his father.   ROS:  Please see the history of present illness.   Otherwise, review of systems is positive for none.   All other systems are reviewed and negative.   PHYSICAL EXAM: VS:  BP 124/80   Pulse (!) 46   Ht 5\' 10"  (1.778 m)   Wt 201 lb 9.6 oz (91.4 kg)   SpO2 96%   BMI 28.93 kg/m  , BMI Body mass index is 28.93 kg/m. GEN: Well nourished, well developed, in no acute distress  HEENT: normal  Neck: no JVD, carotid bruits, or masses Cardiac: RRR; no murmurs, rubs, or gallops,no edema  Respiratory:  clear to auscultation bilaterally, normal work of breathing GI: soft, nontender, nondistended, + BS MS: no deformity or atrophy  Skin: warm and dry Neuro:  Strength and sensation are intact Psych: euthymic mood, full affect  EKG:  EKG is ordered today. Personal review of the ekg ordered shows sinus rhythm, rate 46, 1dAVB   Recent Labs: 11/19/2016: Hemoglobin 14.5; Platelets 204 11/28/2016: BUN 26; Creatinine, Ser 1.42; Magnesium 2.1; Potassium 4.1; Sodium 139 06/04/2017: ALT 25; TSH 1.690    Lipid Panel  No results found for: CHOL, TRIG, HDL, CHOLHDL, VLDL, LDLCALC, LDLDIRECT   Wt Readings from Last 3 Encounters:  09/02/17 201 lb 9.6 oz (91.4 kg)  06/04/17 194 lb 12.8 oz (88.4 kg)  03/18/17 190 lb 9.6 oz (86.5 kg)      Other studies Reviewed: Additional studies/ records that were reviewed today include: TTE 03/02/16  Review of the above records today demonstrates:  Mild concentric LVH. EF 68%. Moderately dilated left atrium. Mild tricuspid  regurgitation without evidence of pulmonary hypertension.   ASSESSMENT AND PLAN:  1.  Persistent atrial fibrillation: Currently on Eliquis and amiodarone.  Dofetilide was stopped at the last visit.  He had ablation 11/28/16 with PVI.  He did return back to atrial fibrillation.  Did on amiodarone at the last visit and is done well.  He has had no further episodes of atrial fibrillation since around Christmas.  This patients CHA2DS2-VASc Score and unadjusted Ischemic Stroke Rate (% per year) is equal to 3.2 % stroke rate/year from a score of 3  Above score calculated as 1 point each if present [CHF, HTN, DM, Vascular=MI/PAD/Aortic Plaque, Age if 65-74, or Male] Above score calculated as 2 points each if present [Age > 75, or Stroke/TIA/TE]  2. Hypertension: Well-controlled today.  No changes.    Current medicines are reviewed at length with the patient today.   The patient does not have concerns regarding his medicines.  The following changes were made today: None  Labs/ tests ordered today include:  Orders Placed This Encounter  Procedures  . EKG 12-Lead     Disposition:   FU with Adaeze Better 6 months  Signed, Jacquline Terrill Jorja LoaMartin Jereline Ticer, MD  09/02/2017 10:36 AM     [facility] [address] [place] [address] [phone] (office) [phone] (fax)

## 2017-09-28 DIAGNOSIS — E875 Hyperkalemia: Secondary | ICD-10-CM | POA: Diagnosis not present

## 2017-09-28 DIAGNOSIS — R001 Bradycardia, unspecified: Secondary | ICD-10-CM | POA: Diagnosis not present

## 2017-09-28 DIAGNOSIS — I480 Paroxysmal atrial fibrillation: Secondary | ICD-10-CM | POA: Diagnosis not present

## 2017-09-28 DIAGNOSIS — Z9889 Other specified postprocedural states: Secondary | ICD-10-CM | POA: Diagnosis not present

## 2017-11-04 DIAGNOSIS — Z0000 Encounter for general adult medical examination without abnormal findings: Secondary | ICD-10-CM | POA: Diagnosis not present

## 2017-12-21 DIAGNOSIS — I480 Paroxysmal atrial fibrillation: Secondary | ICD-10-CM | POA: Diagnosis not present

## 2017-12-21 DIAGNOSIS — E875 Hyperkalemia: Secondary | ICD-10-CM | POA: Diagnosis not present

## 2017-12-21 DIAGNOSIS — Z9889 Other specified postprocedural states: Secondary | ICD-10-CM | POA: Diagnosis not present

## 2017-12-21 DIAGNOSIS — R001 Bradycardia, unspecified: Secondary | ICD-10-CM | POA: Diagnosis not present

## 2018-01-19 DIAGNOSIS — R972 Elevated prostate specific antigen [PSA]: Secondary | ICD-10-CM | POA: Diagnosis not present

## 2018-01-26 DIAGNOSIS — N401 Enlarged prostate with lower urinary tract symptoms: Secondary | ICD-10-CM | POA: Diagnosis not present

## 2018-01-26 DIAGNOSIS — R972 Elevated prostate specific antigen [PSA]: Secondary | ICD-10-CM | POA: Diagnosis not present

## 2018-01-26 DIAGNOSIS — R351 Nocturia: Secondary | ICD-10-CM | POA: Diagnosis not present

## 2018-02-04 ENCOUNTER — Encounter: Payer: Self-pay | Admitting: Cardiology

## 2018-02-24 NOTE — Progress Notes (Signed)
Electrophysiology Office Note   Date:  03/01/2018   ID:  Gregory [name], DOB Nov 12, 1940, MRN [id number]  PCP:  [name], MD  Cardiologist:  Jacinto Halim Primary Electrophysiologist:  Regan Lemming, MD    No chief complaint on file.    History of Present Illness: Gregory [name] is a 77 y.o. male who presents today for electrophysiology evaluation.   He has history of atrial fibrillation, hypertension. He has had multiple cardioversions and has since been put on dofetilide. He was having breakthrough episodes of atrial fibrillation and had ablation on 11/28/16.  At his last visit, he presented to clinic in atrial fibrillation.  Dofetilide was stopped and he was started on amiodarone.  Today, denies symptoms of palpitations, chest pain, shortness of breath, orthopnea, PND, lower extremity edema, claudication, dizziness, presyncope, syncope, bleeding, or neurologic sequela. The patient is tolerating medications without difficulties.  Overall he is felt well.  He has noted minimal palpitations since last being seen.  He is having some constipation after a trip with some bright red blood in his stools.  Past Medical History:  Diagnosis Date  . Arthritis    "right knee" (01/28/2016)  . CAP (community acquired pneumonia) 2013  . Chronic atrial fibrillation (HCC)   . Diverticulosis of colon (without mention of hemorrhage)   . Dysrhythmia   . High blood pressure   . Internal hemorrhoids without mention of complication   . Personal history of colonic polyps 01/17/2002   adenomatous, tublar adenoma 02/10/2007   Past Surgical History:  Procedure Laterality Date  . ATRIAL FIBRILLATION ABLATION  11/28/2016  . ATRIAL FIBRILLATION ABLATION N/A 11/28/2016   Procedure: Atrial Fibrillation Ablation;  Surgeon: Regan Lemming, MD;  Location: [facility] INVASIVE CV LAB;  Service: Cardiovascular;  Laterality: N/A;  . CARDIOVERSION  05/03/2012   Procedure: CARDIOVERSION;  Surgeon: [name], MD;  Location: [facility] ENDOSCOPY;  Service: Cardiovascular;  Laterality: N/A;  . CARDIOVERSION  06/08/2012   Procedure: CARDIOVERSION;  Surgeon: [name], MD;  Location: [facility] ENDOSCOPY;  Service: Cardiovascular;  Laterality: N/A;  Lawrence Marseilles /janie  . CARDIOVERSION N/A 10/28/2013   Procedure: CARDIOVERSION;  Surgeon: [name], MD;  Location: [facility] ENDOSCOPY;  Service: Cardiovascular;  Laterality: N/A;  . CARDIOVERSION N/A 06/30/2014   Procedure: CARDIOVERSION;  Surgeon: [name], MD;  Location: [facility] ENDOSCOPY;  Service: Cardiovascular;  Laterality: N/A;  H&P in file  . CARDIOVERSION N/A 09/05/2014   Procedure: CARDIOVERSION;  Surgeon: [name], MD;  Location: [facility] ENDOSCOPY;  Service: Cardiovascular;  Laterality: N/A;  . CARDIOVERSION N/A 12/24/2015   Procedure: CARDIOVERSION;  Surgeon: [name], MD;  Location: [facility] ENDOSCOPY;  Service: Cardiovascular;  Laterality: N/A;  . CARDIOVERSION N/A 01/30/2016   Procedure: CARDIOVERSION;  Surgeon: [name], MD;  Location: [facility] ENDOSCOPY;  Service: Cardiovascular;  Laterality: N/A;  . CARDIOVERSION N/A 02/08/2016   Procedure: CARDIOVERSION;  Surgeon: [name], MD;  Location: [facility] ENDOSCOPY;  Service: Cardiovascular;  Laterality: N/A;  . CATARACT EXTRACTION W/ INTRAOCULAR LENS  IMPLANT, BILATERAL Bilateral   . COLONOSCOPY    . INGUINAL HERNIA REPAIR Bilateral    as child  . KNEE ARTHROSCOPY Right 2004     Current Outpatient Medications  Medication Sig Dispense Refill  . amiodarone (PACERONE) 200 MG tablet Take 1 tablet (200 mg total) by mouth daily. 90 tablet 2  . apixaban (ELIQUIS) 5 MG TABS tablet Take 5 mg by mouth 2 (two) times daily.    Marland Kitchen ibuprofen (ADVIL,MOTRIN) 200  MG tablet Take 200-400 mg by mouth every 6 (six) hours as needed for mild pain or moderate pain.     No current facility-administered medications for this visit.     Allergies:   Patient has no known allergies.   Social History:  The patient  reports that he has never  smoked. He has never used smokeless tobacco. He reports that he drinks about 7.0 standard drinks of alcohol per week. He reports that he does not use drugs.   Family History:  The patient's family history includes Cancer in his mother; Heart disease in his father; Stroke in his father.   ROS:  Please see the history of present illness.   Otherwise, review of systems is positive for blood in stool.   All other systems are reviewed and negative.   PHYSICAL EXAM: VS:  BP (!) 142/82   Pulse 61   Ht 5\' 10"  (1.778 m)   Wt 199 lb (90.3 kg)   BMI 28.55 kg/m  , BMI Body mass index is 28.55 kg/m. GEN: Well nourished, well developed, in no acute distress  HEENT: normal  Neck: no JVD, carotid bruits, or masses Cardiac: RRR; no murmurs, rubs, or gallops,no edema  Respiratory:  clear to auscultation bilaterally, normal work of breathing GI: soft, nontender, nondistended, + BS MS: no deformity or atrophy  Skin: warm and dry Neuro:  Strength and sensation are intact Psych: euthymic mood, full affect  EKG:  EKG is ordered today. Personal review of the ekg ordered shows sinus rhythm, rate 61, first-degree AV block, nonspecific ST changes  Recent Labs: 06/04/2017: ALT 25; TSH 1.690    Lipid Panel  No results found for: CHOL, TRIG, HDL, CHOLHDL, VLDL, LDLCALC, LDLDIRECT   Wt Readings from Last 3 Encounters:  03/01/18 199 lb (90.3 kg)  09/02/17 201 lb 9.6 oz (91.4 kg)  06/04/17 194 lb 12.8 oz (88.4 kg)      Other studies Reviewed: Additional studies/ records that were reviewed today include: TTE 03/02/16  Review of the above records today demonstrates:  Mild concentric LVH. EF 68%. Moderately dilated left atrium. Mild tricuspid regurgitation without evidence of pulmonary hypertension.   ASSESSMENT AND PLAN:  1.  Persistent atrial fibrillation: Currently on Eliquis and amiodarone.  His dofetilide was stopped at his last visit due to recurrent atrial fibrillation.  He has had minimal  atrial fibrillation since that time.  We will stop his amiodarone and start him on Multitak.  This patients CHA2DS2-VASc Score and unadjusted Ischemic Stroke Rate (% per year) is equal to 3.2 % stroke rate/year from a score of 3  Above score calculated as 1 point each if present [CHF, HTN, DM, Vascular=MI/PAD/Aortic Plaque, Age if 65-74, or Male] Above score calculated as 2 points each if present [Age > 75, or Stroke/TIA/TE]     2. Hypertension: Mildly elevated today.  Will restart lisinopril.    Current medicines are reviewed at length with the patient today.   The patient does not have concerns regarding his medicines.  The following changes were made today: Stop amiodarone, start Multitak, lisinopril  Labs/ tests ordered today include:  Orders Placed This Encounter  Procedures  . EKG 12-Lead     Disposition:   FU with Will Camnitz 6 months  Signed, Will Jorja Loa, MD  03/01/2018 9:50 AM     [facility] [address] [place] [address] [phone] (office) [phone] (fax)

## 2018-03-01 ENCOUNTER — Encounter: Payer: Self-pay | Admitting: Cardiology

## 2018-03-01 ENCOUNTER — Telehealth: Payer: Self-pay | Admitting: Cardiology

## 2018-03-01 ENCOUNTER — Ambulatory Visit (INDEPENDENT_AMBULATORY_CARE_PROVIDER_SITE_OTHER): Payer: Medicare Other | Admitting: Cardiology

## 2018-03-01 VITALS — BP 142/82 | HR 61 | Ht 70.0 in | Wt 199.0 lb

## 2018-03-01 DIAGNOSIS — I10 Essential (primary) hypertension: Secondary | ICD-10-CM

## 2018-03-01 DIAGNOSIS — I480 Paroxysmal atrial fibrillation: Secondary | ICD-10-CM | POA: Diagnosis not present

## 2018-03-01 MED ORDER — LISINOPRIL 5 MG PO TABS: 5.0000 mg | ORAL_TABLET | Freq: Every day | ORAL | 3 refills | Status: DC

## 2018-03-01 MED ORDER — DRONEDARONE HCL 400 MG PO TABS: 400.0000 mg | ORAL_TABLET | Freq: Two times a day (BID) | ORAL | 3 refills | Status: DC

## 2018-03-01 MED ORDER — DRONEDARONE HCL 400 MG PO TABS: 400.0000 mg | ORAL_TABLET | Freq: Two times a day (BID) | ORAL | 6 refills | Status: DC

## 2018-03-01 NOTE — Addendum Note (Signed)
Addended by: Baird LyonsPRICE, Lazaro Isenhower L on: 03/01/2018 10:19 AM   Modules accepted: Orders

## 2018-03-01 NOTE — Telephone Encounter (Signed)
Lajoyce CornersBoone, Emily R, [place] at 03/01/2018 4:14 PM   Status: Signed    Pharmacy faxed over something stating that Multaq is not covered by patients plan. The preferred is Amiodarone, Flecainide Acetate, Propafenone HCL, and Sotalol.

## 2018-03-01 NOTE — Telephone Encounter (Signed)
Oliver BarreSkeen, Felecia F at 03/01/2018 2:05 PM   Status: Signed    New Message

## 2018-03-01 NOTE — Telephone Encounter (Signed)
This encounter was created in error - please disregard.

## 2018-03-01 NOTE — Patient Instructions (Addendum)
Medication Instructions:  Your physician has recommended you make the following change in your medication:  1. STOP Amiodarone 2. START Multaq 400 mg twice daily 3. RESTART Lisinopril 5 mg once daily  * If you need a refill on your cardiac medications before your next appointment, please call your pharmacy.   Labwork: None ordered  Testing/Procedures: None ordered  Follow-Up: Your physician wants you to follow-up in: 6 months with Dr. Elberta Fortis.  You will receive a reminder letter in the mail two months in advance. If you don't receive a letter, please call our office to schedule the follow-up appointment.  *Please note that any paperwork needing to be filled out by the provider will need to be addressed at the front desk prior to seeing the provider. Please note that any FMLA, disability or other documents regarding health condition is subject to a $25.00 charge that must be received prior to completion of paperwork in the form of a money order or check.  Thank you for choosing [facility]!!   Dory Horn, RN [phone]  Any Other Special Instructions Will Be Listed Below (If Applicable).  Dronedarone tablets What is this medicine? DRONEDARONE (droe NE da rone) is an antiarrhythmic drug. It helps make your heart beat regularly. This medicine may be used for other purposes; ask your health care provider or pharmacist if you have questions. COMMON BRAND NAME(S): Multaq What should I tell my health care provider before I take this medicine? They need to know if you have any of these conditions: -heart failure -history of irregular heartbeat -liver disease -liver or lung problems with the past use of amiodarone -low levels of magnesium in the blood -low levels of potassium in the blood -other heart disease -an unusual or allergic reaction to dronedarone, other medicines, foods, dyes, or preservatives -pregnant or trying to get pregnant -breast-feeding How should I use  this medicine? Take this medicine by mouth with a glass of water. Follow the directions on the prescription label. Take one tablet with the morning meal and one tablet with the evening meal. Do not take your medicine more often than directed. Do not stop taking except on the advice of your doctor or health care professional. A special MedGuide will be given to you by the pharmacist with each prescription and refill. Be sure to read this information carefully each time. Talk to your pediatrician regarding the use of this medicine in children. Special care may be needed. Overdosage: If you think you have taken too much of this medicine contact a poison control center or emergency room at once. NOTE: This medicine is only for you. Do not share this medicine with others. What if I miss a dose? If you miss a dose, take it as soon as you can. If it is almost time for your next dose, take only that dose. Do not take double or extra doses. What may interact with this medicine? Do not take this medicine with any of the following medications: -arsenic trioxide -certain antibiotics like clarithromycin, erythromycin, pentamidine, telithromycin, troleandomycin -certain medicines for depression like tricyclic antidepressants -certain medicines for fungal infections like fluconazole, itraconazole, ketoconazole, posaconazole, voriconazole -certain medicines for irregular heart beat like amiodarone, disopyramide, dofetilide, flecainide, ibutilide, quinidine, propafenone, sotalol -certain medicines for malaria like chloroquine, halofantrine -cisapride -cyclosporine -droperidol -haloperidol -methadone -other medicines that prolong the QT interval (cause an abnormal heart rhythm) -pimozide -nefazodone -phenothiazines like chlorpromazine, mesoridazine, prochlorperazine, thioridazine -ritonavir -ziprasidone This medicine may also interact with the following medications: -certain  medicines for blood pressure,  heart disease, or irregular heart beat like diltiazem, metoprolol, propranolol, verapamil -certain medicines for cholesterol like atorvastatin, lovastatin, simvastatin -certain medicines for seizures like carbamazepine, phenobarbital, phenytoin -digoxin -grapefruit juice -rifampin -sirolimus -St. John's Wort -tacrolimus This list may not describe all possible interactions. Give your health care provider a list of all the medicines, herbs, non-prescription drugs, or dietary supplements you use. Also tell them if you smoke, drink alcohol, or use illegal drugs. Some items may interact with your medicine. What should I watch for while using this medicine? Your condition will be monitored closely when you first begin therapy. Often, this drug is first started in a hospital or other monitored health care setting. Once you are on maintenance therapy, visit your doctor or health care professional for regular checks on your progress. Because your condition and use of this medicine carry some risk, it is a good idea to carry an identification card, necklace or bracelet with details of your condition, medications, and doctor or health care professional. Bonita QuinYou may get drowsy or dizzy. Do not drive, use machinery, or do anything that needs mental alertness until you know how this medicine affects you. Do not stand or sit up quickly, especially if you are an older patient. This reduces the risk of dizzy or fainting spells. What side effects may I notice from receiving this medicine? Side effects that you should report to your doctor or health care professional as soon as possible: -allergic reactions like skin rash, itching or hives, swelling of the face, lips, or tongue -breathing problems -cough -dark urine -fast, irregular heartbeat -general ill feeling or flu-like symptoms -light-colored stools -loss of appetite, nausea -right upper belly pain -slow heartbeat -stomach pain -swelling of the legs or  ankles -unusually weak or tired -weight gain -yellowing of the eyes or skin Side effects that usually do not require medical attention (report to your doctor or health care professional if they continue or are bothersome): -nausea -vomiting -stomach pain This list may not describe all possible side effects. Call your doctor for medical advice about side effects. You may report side effects to FDA at 1-800-FDA-1088. Where should I keep my medicine? Keep out of the reach of children. Store at room temperature between 15 and 30 degrees C (59 and 86 degrees F). Throw away any unused medicine after the expiration date. NOTE: This sheet is a summary. It may not cover all possible information. If you have questions about this medicine, talk to your doctor, pharmacist, or health care provider.  2018 Elsevier/Gold Standard (2015-07-05 12:43:06)

## 2018-03-01 NOTE — Telephone Encounter (Signed)
New Message       Patient calling today "Multaq" not covered by his insurance, patient would like to see if there is something different he could use.Pls call to advise.

## 2018-03-01 NOTE — Telephone Encounter (Signed)
New Message   Patient is calling as a follow up from his appointment this morning. He states that did speak with his insurance company about the cost of a medication that was suggested. He indicates that the medication is a bit costly. Please call to discuss.

## 2018-03-01 NOTE — Telephone Encounter (Signed)
Pharmacy faxed over something stating that Multaq is not covered by patients plan. The preferred is Amiodarone, Flecainide Acetate, Propafenone HCL, and Sotalol.

## 2018-03-02 MED ORDER — DRONEDARONE HCL 400 MG PO TABS: 400.0000 mg | ORAL_TABLET | Freq: Two times a day (BID) | ORAL | 6 refills | Status: DC

## 2018-03-02 NOTE — Telephone Encounter (Signed)
Follow up   Patient is requesting a call in reference to previous message.

## 2018-03-02 NOTE — Telephone Encounter (Addendum)
**Note De-Identified Gregory [name] Obfuscation** The pt states that he went to his pharmacy this morning to get his Multaq and was advised that a 30 day supply would cost him $880. The pharmacist had a coupon that took $100 off of the cost so he purchased a 30 day supply.  Prior Berkley Harveyuth is not required and because the pt is on Medicare getting a tier exception below a 3 is not likely but I will attempt.  He is requesting a printed Rx for his Multaq so he can check prices at other pharmacies.  I have printed the RX and Vin Bhagat, PA-c has signed it. RX has been left in the front office for the pt to pick up.  Also, he is requesting a call back from Dr RadioShackCamnitz's nurse. He states that he called earlier this week but did not get a call back.

## 2018-03-03 ENCOUNTER — Other Ambulatory Visit: Payer: Self-pay

## 2018-03-03 NOTE — Telephone Encounter (Signed)
I attempted to do a tier exception on the the pts Dronedarone through covermymeds but was unable as a tier exception form fr this medication is not available.  I called [employer] to do a tier exception over the phone and s/w Huntley DecSara.  Per Huntley DecSara The pt does not have Part D coverage so a tier exception is not possible.  I called the pt and he confirmed that he does not have Part D coverage. He advised me that he sent the Dronedarone RX that I gave him yesterday to [place] as they told him that it will cost him $500 for a 90 day supply.  I discussed this with Sherri, Dr Gershon Craneamnitz's nurse, who advised me that we do not support other countries medications and that we will only maintain refills in the [place]  I then called [employer] (The maker of Multaq) and s/w Brianna who advised me that the pt can apply for pt asst through them as long as the RX is written for name brand Multaq.  I called the pt and left a detailed message explaining that we cannot send his medication refills to [place] and that I am mailing him a [employer] pt asst application to fill out to hopefully get his Multaq at no charge to him. I also left the office phone number in the message so he can call me back if he has questions.

## 2018-03-08 MED ORDER — MULTAQ 400 MG PO TABS: 400.0000 mg | ORAL_TABLET | Freq: Two times a day (BID) | ORAL | 3 refills | Status: DC

## 2018-03-15 ENCOUNTER — Other Ambulatory Visit: Payer: Self-pay

## 2018-03-15 MED ORDER — DRONEDARONE HCL 400 MG PO TABS: 400.0000 mg | ORAL_TABLET | Freq: Two times a day (BID) | ORAL | 3 refills | Status: DC

## 2018-03-15 MED ORDER — MULTAQ 400 MG PO TABS: 400.0000 mg | ORAL_TABLET | Freq: Two times a day (BID) | ORAL | 3 refills | Status: DC

## 2018-03-15 MED ORDER — DRONEDARONE HCL 400 MG PO TABS: 400.0000 mg | ORAL_TABLET | Freq: Two times a day (BID) | ORAL | 6 refills | Status: DC

## 2018-03-15 NOTE — Telephone Encounter (Signed)
The pt returned his [employer] Pt Asst application. I completed the provider part, printed a Multaq RX, Dr Elberta Fortis has signed both and I have faxed all to [employer].

## 2018-03-24 NOTE — Telephone Encounter (Signed)
**Note De-Identified Dreyton Roessner Obfuscation** Letter received Alexxis Mackert fax from [facility] stating that they denied the pt pt asst for Multaq.  Because the letter was unclear as to why they denied the pt I called [employer] and s/w [place]. Per Enrique Sack the pt was denied assistance because his earnings exceed the program guidelines.  I called the pt to make him aware and he states that he received a letter from [employer] as well telling him he was denied. The pt states that he has found another way to pay for his Multaq (he did not give any info) and states that he no longer needs assistance with the cost.  I have advised him to call us back if his situation changes. He verbalized understanding and thanked me for helping him.

## 2018-04-20 DIAGNOSIS — Z23 Encounter for immunization: Secondary | ICD-10-CM | POA: Diagnosis not present

## 2018-06-17 DIAGNOSIS — I480 Paroxysmal atrial fibrillation: Secondary | ICD-10-CM | POA: Diagnosis not present

## 2018-06-17 DIAGNOSIS — R001 Bradycardia, unspecified: Secondary | ICD-10-CM | POA: Diagnosis not present

## 2018-06-28 DIAGNOSIS — I480 Paroxysmal atrial fibrillation: Secondary | ICD-10-CM | POA: Diagnosis not present

## 2018-06-28 DIAGNOSIS — R001 Bradycardia, unspecified: Secondary | ICD-10-CM | POA: Diagnosis not present

## 2018-06-28 DIAGNOSIS — Z9889 Other specified postprocedural states: Secondary | ICD-10-CM | POA: Diagnosis not present

## 2018-06-28 DIAGNOSIS — E875 Hyperkalemia: Secondary | ICD-10-CM | POA: Diagnosis not present

## 2018-08-06 DIAGNOSIS — M1711 Unilateral primary osteoarthritis, right knee: Secondary | ICD-10-CM | POA: Diagnosis not present

## 2018-08-06 DIAGNOSIS — G5793 Unspecified mononeuropathy of bilateral lower limbs: Secondary | ICD-10-CM | POA: Insufficient documentation

## 2018-08-06 DIAGNOSIS — I480 Paroxysmal atrial fibrillation: Secondary | ICD-10-CM | POA: Diagnosis not present

## 2018-08-06 DIAGNOSIS — N183 Chronic kidney disease, stage 3 (moderate): Secondary | ICD-10-CM | POA: Diagnosis not present

## 2018-08-09 DIAGNOSIS — H35372 Puckering of macula, left eye: Secondary | ICD-10-CM | POA: Diagnosis not present

## 2018-08-17 ENCOUNTER — Ambulatory Visit (INDEPENDENT_AMBULATORY_CARE_PROVIDER_SITE_OTHER): Payer: Medicare Other | Admitting: Cardiology

## 2018-08-17 ENCOUNTER — Encounter: Payer: Self-pay | Admitting: Cardiology

## 2018-08-17 VITALS — BP 122/62 | HR 62 | Ht 70.0 in | Wt 199.0 lb

## 2018-08-17 DIAGNOSIS — I4819 Other persistent atrial fibrillation: Secondary | ICD-10-CM

## 2018-08-17 NOTE — Patient Instructions (Signed)
Medication Instructions:  Your physician recommends that you continue on your current medications as directed. Please refer to the Current Medication list given to you today.  If you need a refill on your cardiac medications before your next appointment, please call your pharmacy.   Labwork: None ordered  Testing/Procedures: None ordered  Follow-Up: Your physician wants you to follow-up in: 6 months with Dr. Camnitz.  You will receive a reminder letter in the mail two months in advance. If you don't receive a letter, please call our office to schedule the follow-up appointment.  Thank you for choosing [facility]!!   Koen Antilla, RN [phone]         

## 2018-08-17 NOTE — Progress Notes (Signed)
Electrophysiology Office Note   Date:  08/17/2018   ID:  MAGIC KUHAR, DOB [birth year], MRN [id number]  PCP:  [name], MD  Cardiologist:  Jacinto Halim Primary Electrophysiologist:  Regan Lemming, MD    No chief complaint on file.    History of Present Illness: Gregory [name] is a 78 y.o. male who presents today for electrophysiology evaluation.   He has history of atrial fibrillation, hypertension. He has had multiple cardioversions and has since been put on dofetilide. He was having breakthrough episodes of atrial fibrillation and had ablation on 11/28/16.  At his last visit, he presented to clinic in atrial fibrillation.  Dofetilide was stopped and he was started on amiodarone.  Today, denies symptoms of palpitations, chest pain, shortness of breath, orthopnea, PND, lower extremity edema, claudication, dizziness, presyncope, syncope, bleeding, or neurologic sequela. The patient is tolerating medications without difficulties.  Overall he is doing well.  He has no chest pain or shortness of breath.  Since starting Multitak at the last visit, he has noted no further episodes of atrial fibrillation.  Past Medical History:  Diagnosis Date  . Arthritis    "right knee" (01/28/2016)  . CAP (community acquired pneumonia) 2013  . Chronic atrial fibrillation   . Diverticulosis of colon (without mention of hemorrhage)   . Dysrhythmia   . High blood pressure   . Internal hemorrhoids without mention of complication   . Personal history of colonic polyps 01/17/2002   adenomatous, tublar adenoma 02/10/2007   Past Surgical History:  Procedure Laterality Date  . ATRIAL FIBRILLATION ABLATION  11/28/2016  . ATRIAL FIBRILLATION ABLATION N/A 11/28/2016   Procedure: Atrial Fibrillation Ablation;  Surgeon: Regan Lemming, MD;  Location: [facility] INVASIVE CV LAB;  Service: Cardiovascular;  Laterality: N/A;  . CARDIOVERSION  05/03/2012   Procedure: CARDIOVERSION;  Surgeon: [name],  MD;  Location: [facility] ENDOSCOPY;  Service: Cardiovascular;  Laterality: N/A;  . CARDIOVERSION  06/08/2012   Procedure: CARDIOVERSION;  Surgeon: [name], MD;  Location[facility] ENDOSCOPY;  Service: Cardiovascular;  Laterality: N/A;  Lawrence Marseilles /janie  . CARDIOVERSION N/A 10/28/2013   Procedure: CARDIOVERSION;  Surgeon: [name], MD;  Location: [facility] ENDOSCOPY;  Service: Cardiovascular;  Laterality: N/A;  . CARDIOVERSION N/A 06/30/2014   Procedure: CARDIOVERSION;  Surgeon: [name], MD;  Location: [facility] ENDOSCOPY;  Service: Cardiovascular;  Laterality: N/A;  H&P in file  . CARDIOVERSION N/A 09/05/2014   Procedure: CARDIOVERSION;  Surgeon: [name], MD;  Location: [facility] ENDOSCOPY;  Service: Cardiovascular;  Laterality: N/A;  . CARDIOVERSION N/A 12/24/2015   Procedure: CARDIOVERSION;  Surgeon: [name], MD;  Location: [facility] ENDOSCOPY;  Service: Cardiovascular;  Laterality: N/A;  . CARDIOVERSION N/A 01/30/2016   Procedure: CARDIOVERSION;  Surgeon: [name], MD;  Location: [facility] ENDOSCOPY;  Service: Cardiovascular;  Laterality: N/A;  . CARDIOVERSION N/A 02/08/2016   Procedure: CARDIOVERSION;  Surgeon: [name], MD;  Location: [facility] ENDOSCOPY;  Service: Cardiovascular;  Laterality: N/A;  . CATARACT EXTRACTION W/ INTRAOCULAR LENS  IMPLANT, BILATERAL Bilateral   . COLONOSCOPY    . INGUINAL HERNIA REPAIR Bilateral    as child  . KNEE ARTHROSCOPY Right 2004     Current Outpatient Medications  Medication Sig Dispense Refill  . apixaban (ELIQUIS) 5 MG TABS tablet Take 5 mg by mouth 2 (two) times daily.    Marland Kitchen dronedarone (MULTAQ) 400 MG tablet Take 1 tablet (400 mg total) by mouth 2 (two) times daily with a meal. 180 tablet 3  .  ibuprofen (ADVIL,MOTRIN) 200 MG tablet Take 200-400 mg by mouth every 6 (six) hours as needed for mild pain or moderate pain.    Marland Kitchen lisinopril (PRINIVIL,ZESTRIL) 5 MG tablet Take 1 tablet (5 mg total) by mouth daily. 90 tablet 3   No current facility-administered medications for  this visit.     Allergies:   Patient has no known allergies.   Social History:  The patient  reports that he has never smoked. He has never used smokeless tobacco. He reports current alcohol use of about 7.0 standard drinks of alcohol per week. He reports that he does not use drugs.   Family History:  The patient's family history includes Cancer in his mother; Heart disease in his father; Stroke in his father.   ROS:  Please see the history of present illness.   Otherwise, review of systems is positive for none.   All other systems are reviewed and negative.   PHYSICAL EXAM: VS:  BP 122/62 (BP Location: Right Arm, Patient Position: Sitting, Cuff Size: Normal)   Pulse 62   Ht 5\' 10"  (1.778 m)   Wt 199 lb (90.3 kg)   BMI 28.55 kg/m  , BMI Body mass index is 28.55 kg/m. GEN: Well nourished, well developed, in no acute distress  HEENT: normal  Neck: no JVD, carotid bruits, or masses Cardiac: RRR; no murmurs, rubs, or gallops,no edema  Respiratory:  clear to auscultation bilaterally, normal work of breathing GI: soft, nontender, nondistended, + BS MS: no deformity or atrophy  Skin: warm and dry Neuro:  Strength and sensation are intact Psych: euthymic mood, full affect  EKG:  EKG is ordered today. Personal review of the ekg ordered shows SR, 1dAVB   Recent Labs: No results found for requested labs within last 8760 hours.    Lipid Panel  No results found for: CHOL, TRIG, HDL, CHOLHDL, VLDL, LDLCALC, LDLDIRECT   Wt Readings from Last 3 Encounters:  08/17/18 199 lb (90.3 kg)  03/01/18 199 lb (90.3 kg)  09/02/17 201 lb 9.6 oz (91.4 kg)      Other studies Reviewed: Additional studies/ records that were reviewed today include: TTE 03/02/16  Review of the above records today demonstrates:  Mild concentric LVH. EF 68%. Moderately dilated left atrium. Mild tricuspid regurgitation without evidence of pulmonary hypertension.   ASSESSMENT AND PLAN:  1.  Persistent atrial  fibrillation: He on Eliquis and Multitak.  He has had no further episodes of atrial fibrillation.  No changes at this time.  This patients CHA2DS2-VASc Score and unadjusted Ischemic Stroke Rate (% per year) is equal to 3.2 % stroke rate/year from a score of 3  Above score calculated as 1 point each if present [CHF, HTN, DM, Vascular=MI/PAD/Aortic Plaque, Age if 65-74, or Male] Above score calculated as 2 points each if present [Age > 75, or Stroke/TIA/TE]   2. Hypertension: Well-controlled.  No changes.    Current medicines are reviewed at length with the patient today.   The patient does not have concerns regarding his medicines.  The following changes were made today: None  Labs/ tests ordered today include:  Orders Placed This Encounter  Procedures  . EKG 12-Lead     Disposition:   FU with Deondray Ospina 6 months  Signed, Jamieson Hetland Jorja Loa, MD  08/17/2018 8:58 AM     [facility] [address] [place] [address] [phone] (office) [phone] (fax)

## 2018-10-03 ENCOUNTER — Other Ambulatory Visit: Payer: Self-pay | Admitting: Cardiology

## 2018-10-03 DIAGNOSIS — I480 Paroxysmal atrial fibrillation: Secondary | ICD-10-CM

## 2018-11-16 DIAGNOSIS — Z0000 Encounter for general adult medical examination without abnormal findings: Secondary | ICD-10-CM | POA: Diagnosis not present

## 2018-12-27 ENCOUNTER — Other Ambulatory Visit: Payer: Self-pay

## 2018-12-27 ENCOUNTER — Ambulatory Visit (INDEPENDENT_AMBULATORY_CARE_PROVIDER_SITE_OTHER): Payer: Medicare Other | Admitting: Cardiology

## 2018-12-27 ENCOUNTER — Encounter: Payer: Self-pay | Admitting: Cardiology

## 2018-12-27 VITALS — HR 67 | Ht 71.0 in | Wt 198.0 lb

## 2018-12-27 DIAGNOSIS — N183 Chronic kidney disease, stage 3 unspecified: Secondary | ICD-10-CM

## 2018-12-27 DIAGNOSIS — I480 Paroxysmal atrial fibrillation: Secondary | ICD-10-CM | POA: Diagnosis not present

## 2018-12-27 DIAGNOSIS — Z8679 Personal history of other diseases of the circulatory system: Secondary | ICD-10-CM

## 2018-12-27 DIAGNOSIS — Z9889 Other specified postprocedural states: Secondary | ICD-10-CM

## 2018-12-27 DIAGNOSIS — E7800 Pure hypercholesterolemia, unspecified: Secondary | ICD-10-CM

## 2018-12-27 NOTE — Progress Notes (Signed)
Virtual Visit via Video Note: This visit type was conducted due to national recommendations for restrictions regarding the COVID-19 Pandemic (e.g. social distancing).  This format is felt to be most appropriate for this patient at this time.  All issues noted in this document were discussed and addressed.  No physical exam was performed (except for noted visual exam findings with Telehealth visits).  The patient has consented to conduct a Telehealth visit and understands insurance will be billed.   I connected with@, on 12/27/18 at  by a video enabled telemedicine application and verified that I am speaking with the correct person using two identifiers.   I discussed the limitations of evaluation and management by telemedicine and the availability of in person appointments. The patient expressed understanding and agreed to proceed.   I have discussed with patient regarding the safety during COVID Pandemic and steps and precautions to be taken including social distancing, frequent hand wash and use of detergent soap, gels with the patient. I asked the patient to avoid touching mouth, nose, eyes, ears with the hands. I encouraged regular walking around the neighborhood and exercise and regular diet, as long as social distancing can be maintained.  Primary Physician/Referring:  Christain Sacramento, MD  Patient ID: Gregory [name], male    DOB: 1940-10-11, 78 y.o.   MRN: [id number]  Subjective   Chief Complaint  Patient presents with  . Atrial Fibrillation  . Hypertension  . Follow-up    [name]  is a 78 y.o. male   Caucasian male with history of paroxysmal atrial fibrillation. He has history of multiple cardioversions and eventually dofetilide was discontinued by Dr. [name] due to efficacy, he is presently on Multaq and has maintained sinus since Dec 2018.   Since then, patient thinks that he feels much more stable with regard to atrial fibrillation and has not had any  recurrence.   Presents for 6 month office visit. He is tolerating anticoagulation with Eliquis, No GI bleed, no dark stools. His other history includes stage III chronic kidney disease. There is no history of hypertension or diabetes mellitus.  Past Medical History:  Diagnosis Date  . Arthritis    "right knee" (01/28/2016)  . CAP (community acquired pneumonia) 2013  . Chronic atrial fibrillation   . Diverticulosis of colon (without mention of hemorrhage)   . Dysrhythmia   . High blood pressure   . Internal hemorrhoids without mention of complication   . Personal history of colonic polyps 01/17/2002   adenomatous, tublar adenoma 02/10/2007    Past Surgical History:  Procedure Laterality Date  . ATRIAL FIBRILLATION ABLATION  11/28/2016  . ATRIAL FIBRILLATION ABLATION N/A 11/28/2016   Procedure: Atrial Fibrillation Ablation;  Surgeon: Constance Haw, MD;  Location: [place] CV LAB;  Service: Cardiovascular;  Laterality: N/A;  . CARDIOVERSION  05/03/2012   Procedure: CARDIOVERSION;  Surgeon: Laverda Page, MD;  Location: [facility] ENDOSCOPY;  Service: Cardiovascular;  Laterality: N/A;  . CARDIOVERSION  06/08/2012   Procedure: CARDIOVERSION;  Surgeon: Laverda Page, MD;  Location: [place];  Service: Cardiovascular;  Laterality: N/A;  [name]  . CARDIOVERSION N/A 10/28/2013   Procedure: CARDIOVERSION;  Surgeon: Laverda Page, MD;  Location: [place];  Service: Cardiovascular;  Laterality: N/A;  . CARDIOVERSION N/A 06/30/2014   Procedure: CARDIOVERSION;  Surgeon: Laverda Page, MD;  Location: [place];  Service: Cardiovascular;  Laterality: N/A;  H&P in file  . CARDIOVERSION N/A 09/05/2014   Procedure: CARDIOVERSION;  Surgeon: Adrian Prows, MD;  Location: [place];  Service: Cardiovascular;  Laterality: N/A;  . CARDIOVERSION N/A 12/24/2015   Procedure: CARDIOVERSION;  Surgeon: Adrian Prows, MD;  Location: [facility] ENDOSCOPY;  Service: Cardiovascular;  Laterality: N/A;   . CARDIOVERSION N/A 01/30/2016   Procedure: CARDIOVERSION;  Surgeon: Adrian Prows, MD;  Location: [facility] ENDOSCOPY;  Service: Cardiovascular;  Laterality: N/A;  . CARDIOVERSION N/A 02/08/2016   Procedure: CARDIOVERSION;  Surgeon: Adrian Prows, MD;  Location: [place];  Service: Cardiovascular;  Laterality: N/A;  . CATARACT EXTRACTION W/ INTRAOCULAR LENS  IMPLANT, BILATERAL Bilateral   . COLONOSCOPY    . INGUINAL HERNIA REPAIR Bilateral    as child  . KNEE ARTHROSCOPY Right 2004    Social History   Socioeconomic History  . Marital status: Married    Spouse name: Not on file  . Number of children: 2  . Years of education: Not on file  . Highest education level: Not on file  Occupational History    Employer: [employer]  . Financial resource strain: Not on file  . Food insecurity    Worry: Not on file    Inability: Not on file  . Transportation needs    Medical: Not on file    Non-medical: Not on file  Tobacco Use  . Smoking status: Never Smoker  . Smokeless tobacco: Never Used  Substance and Sexual Activity  . Alcohol use: Yes    Alcohol/week: 7.0 standard drinks    Types: 7 Glasses of wine per week  . Drug use: No  . Sexual activity: Not Currently  Lifestyle  . Physical activity    Days per week: Not on file    Minutes per session: Not on file  . Stress: Not on file  Relationships  . Social Herbalist on phone: Not on file    Gets together: Not on file    Attends religious service: Not on file    Active member of club or organization: Not on file    Attends meetings of clubs or organizations: Not on file    Relationship status: Not on file  . Intimate partner violence    Fear of current or ex partner: Not on file    Emotionally abused: Not on file    Physically abused: Not on file    Forced sexual activity: Not on file  Other Topics Concern  . Not on file  Social History Narrative  . Not on file   Review of Systems  Constitution: Negative  for chills, decreased appetite, malaise/fatigue and weight gain.  Cardiovascular: Negative for dyspnea on exertion, leg swelling and syncope.  Endocrine: Negative for cold intolerance.  Hematologic/Lymphatic: Does not bruise/bleed easily.  Musculoskeletal: Negative for joint swelling.  Gastrointestinal: Negative for abdominal pain, anorexia, change in bowel habit, hematochezia and melena.  Neurological: Negative for headaches and light-headedness.  Psychiatric/Behavioral: Negative for depression and substance abuse.  All other systems reviewed and are negative.  Objective  Pulse 67, height 5' 11"  (1.803 m), weight 198 lb (89.8 kg), SpO2 99 %. Body mass index is 27.62 kg/m. Physical exam not performed or limited due to virtual visit.  Patient appeared to be in no distress, Neck was supple, respiration was not labored.  Please see exam details from prior visit is as below.   Physical Exam  Constitutional: He appears well-developed and well-nourished. No distress.  HENT:  Head: Atraumatic.  Eyes: Conjunctivae are normal.  Neck: Neck supple. No JVD present.  No thyromegaly present.  Cardiovascular: Normal rate, regular rhythm, S1 normal, S2 normal and intact distal pulses. Exam reveals no gallop.  Murmur heard.  Midsystolic murmur is present at the apex.  Early diastolic murmur is present with a grade of 2/6 at the upper left sternal border. Pulmonary/Chest: Effort normal and breath sounds normal.  Abdominal: Soft. Bowel sounds are normal.  Musculoskeletal: Normal range of motion.        General: No edema.  Neurological: He is alert.  Skin: Skin is warm and dry.  Psychiatric: He has a normal mood and affect.   Radiology: No results found.  Laboratory examination:   06/18/2018: TSH 1.69.  Normal H and H, MCV 98, MCH 33, CBC otherwise normal.  Creatinine 1.82, EGFR 35/41, potassium 5.2, CMP otherwise normal.  09/28/2017: Creatinine 1.84, EGFR 35/41, potassium 5.6, CMP otherwise normal.   Normal H&H, MCV 99, CBC otherwise normal.  05/23/2015: Total cholesterol 208, triglycerides 113, HDL 53, LDL 132, creatinine 1.53, eGFR 44, potassium 4.6, CMP otherwise normal, CBC normal, PSA elevated at 5.3   CMP Latest Ref Rng & Units 06/04/2017 11/28/2016 11/19/2016  Glucose 65 - 99 mg/dL - 183(H) 110(H)  BUN 6 - 20 mg/dL - 26(H) 32(H)  Creatinine 0.61 - 1.24 mg/dL - 1.42(H) 1.63(H)  Sodium 135 - 145 mmol/L - 139 140  Potassium 3.5 - 5.1 mmol/L - 4.1 4.7  Chloride 101 - 111 mmol/L - 109 103  CO2 22 - 32 mmol/L - 22 23  Calcium 8.9 - 10.3 mg/dL - 8.5(L) 9.3  Total Protein 6.0 - 8.5 g/dL 6.7 - -  Total Bilirubin 0.0 - 1.2 mg/dL 0.3 - -  Alkaline Phos 39 - 117 IU/L 64 - -  AST 0 - 40 IU/L 19 - -  ALT 0 - 44 IU/L 25 - -   CBC Latest Ref Rng & Units 11/19/2016 01/29/2016 12/24/2015  WBC 3.4 - 10.8 x10E3/uL 5.3 5.8 -  Hemoglobin 13.0 - 17.7 g/dL 14.5 14.4 15.6  Hematocrit 37.5 - 51.0 % 43.3 43.5 46.0  Platelets 150 - 379 x10E3/uL 204 216 -   Lipid Panel  No results found for: CHOL, TRIG, HDL, CHOLHDL, VLDL, LDLCALC, LDLDIRECT HEMOGLOBIN A1C No results found for: HGBA1C, MPG TSH No results for input(s): TSH in the last 8760 hours.  Medications   Current Outpatient Medications  Medication Instructions  . acetaminophen (TYLENOL) 650 mg, Oral, Every 6 hours PRN  . dronedarone (MULTAQ) 400 mg, Oral, 2 times daily with meals  . ELIQUIS 5 MG TABS tablet TAKE 1 TABLET BY MOUTH TWICE DAILY  . lisinopril (ZESTRIL) 5 mg, Oral, Daily  . psyllium (METAMUCIL) 58.6 % packet 1 packet, Oral, Daily    Cardiac Studies:   Echocardiogram [02/26/2016]: 1. Left ventricle cavity is normal in size. Mild concentric hypertrophy of the left ventricle. Normal global wall motion. Calculated EF 68%. 2. Left atrial cavity is moderately dilated. 3. Mild to moderate mitral regurgitation. 4. Mild tricuspid regurgitation. No evidence of pulmonary hypertension. 5. Mild to moderate pulmonic regurgitation. 6.  c.f. echo. of 07/19/2013, RV, RA sizes appear normal, TR is less severe, and, there is no pulmonary hypertension. MR appears slightly less severe.  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 Trifascicular block. Normal BP response. There was no ST-T changes of ischemia with exercise stress test. No stress induced arrhythmias. Stress terminated due to THR (>85% MPHR)/MPHR met. The patient exercised according to Bruce Protocol,  Total time recorded 06:50 min achieving max heart rate of 158 which was 106% of MPHR for age and 8.34 METS of work.  Assessment   Paroxysmal atrial fibrillation (HCC) - CHA2DS2-VASc Score is 2.  Yearly risk of stroke: 2.3%.   CKD (chronic kidney disease) stage 3, GFR 30-59 ml/min (HCC) - Plan: CBC, CMP14+EGFR,   S/P ablation of atrial fibrillation  Hypercholesterolemia - Plan: Lipid Panel With LDL/HDL Ratio,   EKG 06/28/2018: Sinus rhythm with first-degree AV block at the rate of 58 bpm, normal axis, normal QT interval. Low-voltage complexes. No significant change from EKG 12/21/2017. Recommendation:    He is presently doing well and essentially remains asymptomatic and states that he has not had any recurrence of atrial fibrillation since being on Multitac.  I have recommended that we repeat his labs in view of stage III chronic kidney disease.  He also has mild hyperlipidemia we will recheck his lipids as well.  He has no history of hypertension Or diabetes mellitus.  His cardioembolic risk is 2.0.  He will continue anticoagulation as per his preference as well.  He is on lisinopril for chronic kidney disease at the small dose.  I would like to see him back in 3 months for follow-up with repeat EKG. I reviewed Dr. Molli Posey itz's notes from EP standpoint as well.   Adrian Prows, MD, [facility] 12/27/2018, 10:21 AM [facility] [place] Pager: [phone] Office: [phone] If no answer Cell  [phone]

## 2018-12-29 DIAGNOSIS — N183 Chronic kidney disease, stage 3 (moderate): Secondary | ICD-10-CM | POA: Diagnosis not present

## 2018-12-29 DIAGNOSIS — E7800 Pure hypercholesterolemia, unspecified: Secondary | ICD-10-CM | POA: Diagnosis not present

## 2018-12-30 LAB — CBC
Hematocrit: 42.9 % (ref 37.5–51.0)
Hemoglobin: 14.6 g/dL (ref 13.0–17.7)
MCH: 33 pg (ref 26.6–33.0)
MCHC: 34 g/dL (ref 31.5–35.7)
MCV: 97 fL (ref 79–97)
Platelets: 191 10*3/uL (ref 150–450)
RBC: 4.43 x10E6/uL (ref 4.14–5.80)
RDW: 13.1 % (ref 11.6–15.4)
WBC: 5.1 10*3/uL (ref 3.4–10.8)

## 2018-12-30 LAB — CMP14+EGFR
ALT: 14 IU/L (ref 0–44)
AST: 14 IU/L (ref 0–40)
Albumin/Globulin Ratio: 1.9 (ref 1.2–2.2)
Albumin: 4.1 g/dL (ref 3.7–4.7)
Alkaline Phosphatase: 55 IU/L (ref 39–117)
BUN/Creatinine Ratio: 13 (ref 10–24)
BUN: 24 mg/dL (ref 8–27)
Bilirubin Total: 0.6 mg/dL (ref 0.0–1.2)
CO2: 21 mmol/L (ref 20–29)
Calcium: 9.1 mg/dL (ref 8.6–10.2)
Chloride: 106 mmol/L (ref 96–106)
Creatinine, Ser: 1.79 mg/dL — ABNORMAL HIGH (ref 0.76–1.27)
GFR calc Af Amer: 41 mL/min/{1.73_m2} — ABNORMAL LOW (ref >59)
GFR calc non Af Amer: 36 mL/min/{1.73_m2} — ABNORMAL LOW (ref >59)
Globulin, Total: 2.2 g/dL (ref 1.5–4.5)
Glucose: 138 mg/dL — ABNORMAL HIGH (ref 65–99)
Potassium: 4.2 mmol/L (ref 3.5–5.2)
Sodium: 140 mmol/L (ref 134–144)
Total Protein: 6.3 g/dL (ref 6.0–8.5)

## 2018-12-30 LAB — LIPID PANEL WITH LDL/HDL RATIO
Cholesterol, Total: 169 mg/dL (ref 100–199)
HDL: 49 mg/dL (ref >39)
LDL Calculated: 103 mg/dL — ABNORMAL HIGH (ref 0–99)
LDl/HDL Ratio: 2.1 ratio (ref 0.0–3.6)
Triglycerides: 83 mg/dL (ref 0–149)
VLDL Cholesterol Cal: 17 mg/dL (ref 5–40)

## 2019-01-07 ENCOUNTER — Other Ambulatory Visit: Payer: Self-pay

## 2019-01-07 MED ORDER — MULTAQ 400 MG PO TABS: 400.0000 mg | ORAL_TABLET | Freq: Two times a day (BID) | ORAL | 0 refills | Status: DC

## 2019-01-07 NOTE — Telephone Encounter (Signed)
Can we fill his Multaq?

## 2019-01-18 NOTE — Progress Notes (Signed)
Cardiology Office Note Date:  01/19/2019  Patient ID:  Gregory [name], Gregory [name] 04-24-1941, MRN [id number] PCP:  Christain Sacramento, MD  Cardiologist:  Dr. Einar Gip Electrophysiologist: Dr. [name]     Chief Complaint: planned EP visit  History of Present Illness: Gregory [name] is a 78 y.o. male with history of HTN, CKD (III), and paroxysmal AFib.  He comes in today to be seen for Dr. [name], last seen by him 02/2018.  At that time doing well, minimal symptoms of Afib on amiodarone, switched to multaq.  He was resumed on Lisinopril for higher BP readings (note Dr. Einar Gip reports ACE is 2/2 CKD management not HTN)  More recently he saw Dr. Einar Gip via telehealth visit last month.  Doing well, no changes were made.  He continues to feel very well.  He does not formally exercise though cares for his home, yard, recently his wife's aunt went to a SNF and they had to clean up and clear out her house to sell it, and reports no exertional intolerances.  He denis any symtoms of his AFib, thinks it has been since Dec or so of last year that he has had any palpitations.  He denies any SOB, DOE, no dizzy spells, near syncope or syncope  He is tolerating his medicines, denies any bleeing/signs of bleeding  COVID education/precautions were discussed with the patient today   AFib hx H/o multiple cardioversions PVI ablation 11/28/16, Dr. [name] AAD Tikosyn > failed Amiodarone stopped 03/01/18 (unclear why, switched to Multaq) Multaq is current   Past Medical History:  Diagnosis Date  . Arthritis    "right knee" (01/28/2016)  . CAP (community acquired pneumonia) 2013  . Chronic atrial fibrillation   . Diverticulosis of colon (without mention of hemorrhage)   . Dysrhythmia   . High blood pressure   . Internal hemorrhoids without mention of complication   . Personal history of colonic polyps 01/17/2002   adenomatous, tublar adenoma 02/10/2007    Past Surgical History:  Procedure Laterality Date   . ATRIAL FIBRILLATION ABLATION  11/28/2016  . ATRIAL FIBRILLATION ABLATION N/A 11/28/2016   Procedure: Atrial Fibrillation Ablation;  Surgeon: Constance Haw, MD;  Location: [place] [facility];  Service: Cardiovascular;  Laterality: N/A;  . CARDIOVERSION  05/03/2012   Procedure: CARDIOVERSION;  Surgeon: Laverda Page, MD;  Location: [facility] ENDOSCOPY;  Service: Cardiovascular;  Laterality: N/A;  . CARDIOVERSION  06/08/2012   Procedure: CARDIOVERSION;  Surgeon: Laverda Page, MD;  Location: [place];  Service: Cardiovascular;  Laterality: N/A;  [name]  . CARDIOVERSION N/A 10/28/2013   Procedure: CARDIOVERSION;  Surgeon: Laverda Page, MD;  Location: [place];  Service: Cardiovascular;  Laterality: N/A;  . CARDIOVERSION N/A 06/30/2014   Procedure: CARDIOVERSION;  Surgeon: Laverda Page, MD;  Location: [place];  Service: Cardiovascular;  Laterality: N/A;  H&P in file  . CARDIOVERSION N/A 09/05/2014   Procedure: CARDIOVERSION;  Surgeon: Adrian Prows, MD;  Location: [place];  Service: Cardiovascular;  Laterality: N/A;  . CARDIOVERSION N/A 12/24/2015   Procedure: CARDIOVERSION;  Surgeon: Adrian Prows, MD;  Location: [facility] ENDOSCOPY;  Service: Cardiovascular;  Laterality: N/A;  . CARDIOVERSION N/A 01/30/2016   Procedure: CARDIOVERSION;  Surgeon: Adrian Prows, MD;  Location: [facility] ENDOSCOPY;  Service: Cardiovascular;  Laterality: N/A;  . CARDIOVERSION N/A 02/08/2016   Procedure: CARDIOVERSION;  Surgeon: Adrian Prows, MD;  Location: [place];  Service: Cardiovascular;  Laterality: N/A;  . CATARACT EXTRACTION W/ INTRAOCULAR LENS  IMPLANT, BILATERAL  Bilateral   . COLONOSCOPY    . INGUINAL HERNIA REPAIR Bilateral    as child  . KNEE ARTHROSCOPY Right 2004    Current Outpatient Medications  Medication Sig Dispense Refill  . acetaminophen (TYLENOL) 325 MG tablet Take 650 mg by mouth every 6 (six) hours as needed.    . dronedarone (MULTAQ) 400 MG tablet Take 1 tablet (400 mg  total) by mouth 2 (two) times daily with a meal. 180 tablet 0  . ELIQUIS 5 MG TABS tablet TAKE 1 TABLET BY MOUTH TWICE DAILY 180 tablet 1  . lisinopril (PRINIVIL,ZESTRIL) 5 MG tablet Take 1 tablet (5 mg total) by mouth daily. 90 tablet 3  . psyllium (METAMUCIL) 58.6 % packet Take 1 packet by mouth daily.     No current facility-administered medications for this visit.     Allergies:   Patient has no known allergies.   Social History:  The patient  reports that he has never smoked. He has never used smokeless tobacco. He reports current alcohol use of about 7.0 standard drinks of alcohol per week. He reports that he does not use drugs.   Family History:  The patient's family history includes Cancer in his mother; Heart disease in his father; Stroke in his father.  ROS:  Please see the history of present illness.    All other systems are reviewed and otherwise negative.   PHYSICAL EXAM:  VS:  BP 124/72   Pulse (!) 58   Ht 5' 11"  (1.803 m)   Wt 192 lb (87.1 kg)   BMI 26.78 kg/m  BMI: Body mass index is 26.78 kg/m. Well nourished, well developed, in no acute distress  HEENT: normocephalic, atraumatic  Neck: no JVD, carotid bruits or masses Cardiac:  RRR; no significant murmurs, no rubs, or gallops Lungs:  CTA b/l, no wheezing, rhonchi or rales  Abd: soft, nontender MS: no deformity or atrophy Ext: no edema  Skin: warm and dry, no rash Neuro:  No gross deficits appreciated Psych: euthymic mood, full affect    EKG:  Not done today   Echocardiogram [02/26/2016]: 1. Left ventricle cavity is normal in size. Mild concentric hypertrophy of the left ventricle. Normal global wall motion. Calculated EF 68%. 2. Left atrial cavity is moderately dilated. 3. Mild to moderate mitral regurgitation. 4. Mild tricuspid regurgitation. No evidence of pulmonary hypertension. 5. Mild to moderate pulmonic regurgitation. 6. c.f. echo. of 07/19/2013, RV, RA sizes appear normal, TR is less severe,  and, there is no pulmonary hypertension. MR appears slightly less severe.  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 Trifascicular block. Normal BP response. There was no ST-T changes of ischemia with exercise stress test. No stress induced arrhythmias. Stress terminated due to THR (>85% MPHR)/MPHR met. The patient exercised according to Bruce Protocol, Total time recorded 06:50 min achieving max heart rate of 158 which was 106% of MPHR for age and 8.34 METS of work.  Recent Labs: 12/29/2018: ALT 14; BUN 24; Creatinine, Ser 1.79; Hemoglobin 14.6; Platelets 191; Potassium 4.2; Sodium 140  12/29/2018: Cholesterol, Total 169; HDL 49; LDL Calculated 103; Triglycerides 83   CrCl cannot be calculated (Patient's most recent lab result is older than the maximum 21 days allowed.).   Wt Readings from Last 3 Encounters:  01/19/19 192 lb (87.1 kg)  12/27/18 198 lb (89.8 kg)  08/17/18 199 lb (90.3 kg)     Other studies reviewed: Additional studies/records reviewed  today include: summarized above  ASSESSMENT AND PLAN:  1. Paroxysmal Afib     CHA2DS2Vasc is 2 (3 if HTN), on Eliquis appropriately dosed for age/weight     Tolerating  Multaq     No symptoms of his AFib  2. HTN (?)     Dr. Einar Gip reports he does not have HTN, on lisinopril for his CKD  3.  CKD (III0      Baseline Creat looks 1.4-1.6, he has been 1.7 range in the past      The patient prefers to and will follow up with Dr. Einar Gip for this and management of his lisinopril.    Disposition: F/u with EP in a year or PRN, he sees Dr. Einar Gip regularly, at [facility] biannually.  He will reach out to him on his lab results.  Current medicines are reviewed at length with the patient today.  The patient did not have any concerns regarding medicines.  Venetia Night, PA-C 01/19/2019 10:05 AM     [address]  [place] [address] [phone] (office)  [phone] (fax)

## 2019-01-19 ENCOUNTER — Other Ambulatory Visit: Payer: Self-pay

## 2019-01-19 ENCOUNTER — Encounter: Payer: Self-pay | Admitting: Physician Assistant

## 2019-01-19 ENCOUNTER — Ambulatory Visit (INDEPENDENT_AMBULATORY_CARE_PROVIDER_SITE_OTHER): Payer: Medicare Other | Admitting: Physician Assistant

## 2019-01-19 VITALS — BP 124/72 | HR 58 | Ht 71.0 in | Wt 192.0 lb

## 2019-01-19 DIAGNOSIS — N183 Chronic kidney disease, stage 3 unspecified: Secondary | ICD-10-CM

## 2019-01-19 DIAGNOSIS — I480 Paroxysmal atrial fibrillation: Secondary | ICD-10-CM | POA: Diagnosis not present

## 2019-01-19 DIAGNOSIS — Z5181 Encounter for therapeutic drug level monitoring: Secondary | ICD-10-CM | POA: Diagnosis not present

## 2019-01-19 NOTE — Patient Instructions (Signed)
Medication Instructions:  Your physician recommends that you continue on your current medications as directed. Please refer to the Current Medication list given to you today.  If you need a refill on your cardiac medications before your next appointment, please call your pharmacy.   Lab work: NONE ORDERED  TODAY   If you have labs (blood work) drawn today and your tests are completely normal, you will receive your results only by: Marland Kitchen MyChart Message (if you have MyChart) OR . A paper copy in the mail If you have any lab test that is abnormal or we need to change your treatment, we will call you to review the results.  Testing/Procedures: NONE ORDERED  TODAY    Follow-Up: At [facility], you and your health needs are our priority.  As part of our continuing mission to provide you with exceptional heart care, we have created designated Provider Care Teams.  These Care Teams include your primary Cardiologist (physician) and Advanced Practice Providers (APPs -  Physician Assistants and Nurse Practitioners) who all work together to provide you with the care you need, when you need it. Dr. [name] in a year or CONTACT [facility]ARE [phone] AS NEEDED FOR  ANY CARDIAC RELATED SYMPTOMS   Any Other Special Instructions Will Be Listed Below (If Applicable).

## 2019-01-27 ENCOUNTER — Telehealth: Payer: Self-pay

## 2019-01-27 NOTE — Telephone Encounter (Signed)
Pt called and said tat he feels lie he is back in AFIB and has felt in for 1 day now

## 2019-01-28 NOTE — Telephone Encounter (Signed)
If symptoms persist by Monday to call and may need cardioversion

## 2019-01-31 ENCOUNTER — Encounter: Payer: Self-pay | Admitting: Cardiology

## 2019-01-31 ENCOUNTER — Other Ambulatory Visit: Payer: Self-pay

## 2019-01-31 ENCOUNTER — Ambulatory Visit (INDEPENDENT_AMBULATORY_CARE_PROVIDER_SITE_OTHER): Payer: Medicare Other | Admitting: Cardiology

## 2019-01-31 VITALS — BP 110/75 | HR 80 | Ht 70.5 in | Wt 195.0 lb

## 2019-01-31 DIAGNOSIS — Z9889 Other specified postprocedural states: Secondary | ICD-10-CM | POA: Diagnosis not present

## 2019-01-31 DIAGNOSIS — I129 Hypertensive chronic kidney disease with stage 1 through stage 4 chronic kidney disease, or unspecified chronic kidney disease: Secondary | ICD-10-CM | POA: Diagnosis not present

## 2019-01-31 DIAGNOSIS — N183 Chronic kidney disease, stage 3 unspecified: Secondary | ICD-10-CM

## 2019-01-31 DIAGNOSIS — I10 Essential (primary) hypertension: Secondary | ICD-10-CM

## 2019-01-31 DIAGNOSIS — Z8679 Personal history of other diseases of the circulatory system: Secondary | ICD-10-CM

## 2019-01-31 DIAGNOSIS — Z5181 Encounter for therapeutic drug level monitoring: Secondary | ICD-10-CM

## 2019-01-31 DIAGNOSIS — I480 Paroxysmal atrial fibrillation: Secondary | ICD-10-CM

## 2019-01-31 NOTE — H&P (View-Only) (Signed)
Primary Physician/Referring:  Christain Sacramento, MD  Patient ID: Gregory [name], male    DOB: Nov 05, 1940, 78 y.o.   MRN: [id number]  Subjective   Chief Complaint  Patient presents with  . Atrial Fibrillation    [name]  is a 78 y.o. male   Caucasian male with history of paroxysmal atrial fibrillation. He has history of multiple cardioversions and eventually dofetilide was discontinued by Dr. [name] due to efficacy, he is presently on Multaq and has maintained sinus since Dec 2018.   He is tolerating anticoagulation with Eliquis, No GI bleed, no dark stools. His other history includes stage III chronic kidney disease. There is no history of hypertension or diabetes mellitus.  He called our office 5 days ago stating that his back in atrial fibrillation.  We made him come into the office, if he still persisted in atrial fibrillation, he presents for follow-up.  States that he has noticed mild fatigue and dyspnea since being in atrial fibrillation and he feels his pulse to be very regular but states that it is not going fast.  Past Medical History:  Diagnosis Date  . Arthritis    "right knee" (01/28/2016)  . CAP (community acquired pneumonia) 2013  . Chronic atrial fibrillation   . Diverticulosis of colon (without mention of hemorrhage)   . Dysrhythmia   . High blood pressure   . Internal hemorrhoids without mention of complication   . Personal history of colonic polyps 01/17/2002   adenomatous, tublar adenoma 02/10/2007    Past Surgical History:  Procedure Laterality Date  . ATRIAL FIBRILLATION ABLATION  11/28/2016  . ATRIAL FIBRILLATION ABLATION N/A 11/28/2016   Procedure: Atrial Fibrillation Ablation;  Surgeon: Constance Haw, MD;  Location: [place] [facility];  Service: Cardiovascular;  Laterality: N/A;  . CARDIOVERSION  05/03/2012   Procedure: CARDIOVERSION;  Surgeon: Laverda Page, MD;  Location: [facility] ENDOSCOPY;  Service: Cardiovascular;  Laterality:  N/A;  . CARDIOVERSION  06/08/2012   Procedure: CARDIOVERSION;  Surgeon: Laverda Page, MD;  Location: [place];  Service: Cardiovascular;  Laterality: N/A;  [name]  . CARDIOVERSION N/A 10/28/2013   Procedure: CARDIOVERSION;  Surgeon: Laverda Page, MD;  Location: [place];  Service: Cardiovascular;  Laterality: N/A;  . CARDIOVERSION N/A 06/30/2014   Procedure: CARDIOVERSION;  Surgeon: Laverda Page, MD;  Location: [place];  Service: Cardiovascular;  Laterality: N/A;  H&P in file  . CARDIOVERSION N/A 09/05/2014   Procedure: CARDIOVERSION;  Surgeon: Adrian Prows, MD;  Location: [place];  Service: Cardiovascular;  Laterality: N/A;  . CARDIOVERSION N/A 12/24/2015   Procedure: CARDIOVERSION;  Surgeon: Adrian Prows, MD;  Location: [facility] ENDOSCOPY;  Service: Cardiovascular;  Laterality: N/A;  . CARDIOVERSION N/A 01/30/2016   Procedure: CARDIOVERSION;  Surgeon: Adrian Prows, MD;  Location: [facility] ENDOSCOPY;  Service: Cardiovascular;  Laterality: N/A;  . CARDIOVERSION N/A 02/08/2016   Procedure: CARDIOVERSION;  Surgeon: Adrian Prows, MD;  Location: [place];  Service: Cardiovascular;  Laterality: N/A;  . CATARACT EXTRACTION W/ INTRAOCULAR LENS  IMPLANT, BILATERAL Bilateral   . COLONOSCOPY    . INGUINAL HERNIA REPAIR Bilateral    as child  . KNEE ARTHROSCOPY Right 2004    Social History   Socioeconomic History  . Marital status: Married    Spouse name: Not on file  . Number of children: 2  . Years of education: Not on file  . Highest education level: Not on file  Occupational History    Employer: [employer]  Social Needs  . Financial resource strain: Not on file  . Food insecurity    Worry: Not on file    Inability: Not on file  . Transportation needs    Medical: Not on file    Non-medical: Not on file  Tobacco Use  . Smoking status: Never Smoker  . Smokeless tobacco: Never Used  Substance and Sexual Activity  . Alcohol use: Yes    Alcohol/week: 7.0 standard drinks     Types: 7 Glasses of wine per week  . Drug use: No  . Sexual activity: Not Currently  Lifestyle  . Physical activity    Days per week: Not on file    Minutes per session: Not on file  . Stress: Not on file  Relationships  . Social Herbalist on phone: Not on file    Gets together: Not on file    Attends religious service: Not on file    Active member of club or organization: Not on file    Attends meetings of clubs or organizations: Not on file    Relationship status: Not on file  . Intimate partner violence    Fear of current or ex partner: Not on file    Emotionally abused: Not on file    Physically abused: Not on file    Forced sexual activity: Not on file  Other Topics Concern  . Not on file  Social History Narrative  . Not on file   Review of Systems  Constitution: Positive for malaise/fatigue (mild). Negative for chills, decreased appetite and weight gain.  Cardiovascular: Negative for dyspnea on exertion, leg swelling and syncope.  Respiratory: Positive for shortness of breath (mild).   Endocrine: Negative for cold intolerance.  Hematologic/Lymphatic: Does not bruise/bleed easily.  Musculoskeletal: Negative for joint swelling.  Gastrointestinal: Negative for abdominal pain, anorexia, change in bowel habit, hematochezia and melena.  Neurological: Negative for headaches and light-headedness.  Psychiatric/Behavioral: Negative for depression and substance abuse.  All other systems reviewed and are negative.  Objective  Blood pressure 110/75, pulse 80, height 5' 10.5" (1.791 m), weight 195 lb (88.5 kg). Body mass index is 27.58 kg/m. Physical exam not performed or limited due to virtual visit.  Patient appeared to be in no distress, Neck was supple, respiration was not labored.  Please see exam details from prior visit is as below.   Physical Exam  Constitutional: He appears well-developed and well-nourished. No distress.  HENT:  Head: Atraumatic.  Eyes:  Conjunctivae are normal.  Neck: Neck supple. No JVD present. No thyromegaly present.  Cardiovascular: Normal rate, S1 normal, S2 normal, intact distal pulses and normal pulses. An irregularly irregular rhythm present. Exam reveals no gallop.  Murmur heard.  Midsystolic murmur is present at the apex.  Early diastolic murmur is present with a grade of 2/6 at the upper left sternal border. S1 is variable, S2 is normal.  No carotid bruit, no leg edema.   Pulmonary/Chest: Effort normal and breath sounds normal.  Abdominal: Soft. Bowel sounds are normal.  Musculoskeletal: Normal range of motion.        General: No edema.  Neurological: He is alert.  Skin: Skin is warm and dry.  Psychiatric: He has a normal mood and affect.   Radiology: No results found.  Laboratory examination:   06/18/2018: TSH 1.69.  Normal H and H, MCV 98, MCH 33, CBC otherwise normal.  Creatinine 1.82, EGFR 35/41, potassium 5.2, CMP otherwise normal.  09/28/2017: Creatinine 1.84, EGFR  35/41, potassium 5.6, CMP otherwise normal.  Normal H&H, MCV 99, CBC otherwise normal.  05/23/2015: Total cholesterol 208, triglycerides 113, HDL 53, LDL 132, creatinine 1.53, eGFR 44, potassium 4.6, CMP otherwise normal, CBC normal, PSA elevated at 5.3   CMP Latest Ref Rng & Units 12/29/2018 06/04/2017 11/28/2016  Glucose 65 - 99 mg/dL 138(H) - 183(H)  BUN 8 - 27 mg/dL 24 - 26(H)  Creatinine 0.76 - 1.27 mg/dL 1.79(H) - 1.42(H)  Sodium 134 - 144 mmol/L 140 - 139  Potassium 3.5 - 5.2 mmol/L 4.2 - 4.1  Chloride 96 - 106 mmol/L 106 - 109  CO2 20 - 29 mmol/L 21 - 22  Calcium 8.6 - 10.2 mg/dL 9.1 - 8.5(L)  Total Protein 6.0 - 8.5 g/dL 6.3 6.7 -  Total Bilirubin 0.0 - 1.2 mg/dL 0.6 0.3 -  Alkaline Phos 39 - 117 IU/L 55 64 -  AST 0 - 40 IU/L 14 19 -  ALT 0 - 44 IU/L 14 25 -   CBC Latest Ref Rng & Units 12/29/2018 11/19/2016 01/29/2016  WBC 3.4 - 10.8 x10E3/uL 5.1 5.3 5.8  Hemoglobin 13.0 - 17.7 g/dL 14.6 14.5 14.4  Hematocrit 37.5 - 51.0 %  42.9 43.3 43.5  Platelets 150 - 450 x10E3/uL 191 204 216   Lipid Panel     Component Value Date/Time   CHOL 169 12/29/2018 0816   TRIG 83 12/29/2018 0816   HDL 49 12/29/2018 0816   LDLCALC 103 (H) 12/29/2018 0816   HEMOGLOBIN A1C No results found for: HGBA1C, MPG TSH No results for input(s): TSH in the last 8760 hours.  Medications   Current Outpatient Medications  Medication Instructions  . acetaminophen (TYLENOL) 650 mg, Oral, Every 6 hours PRN  . ELIQUIS 5 MG TABS tablet TAKE 1 TABLET BY MOUTH TWICE DAILY  . lisinopril (ZESTRIL) 5 mg, Oral, Daily  . Multaq 400 mg, Oral, 2 times daily with meals  . psyllium (METAMUCIL) 58.6 % packet 1 packet, Oral, Daily    Cardiac Studies:   Echocardiogram [02/26/2016]: 1. Left ventricle cavity is normal in size. Mild concentric hypertrophy of the left ventricle. Normal global wall motion. Calculated EF 68%. 2. Left atrial cavity is moderately dilated. 3. Mild to moderate mitral regurgitation. 4. Mild tricuspid regurgitation. No evidence of pulmonary hypertension. 5. Mild to moderate pulmonic regurgitation. 6. c.f. echo. of 07/19/2013, RV, RA sizes appear normal, TR is less severe, and, there is no pulmonary hypertension. MR appears slightly less severe.  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 Trifascicular block. Normal BP response. There was no ST-T changes of ischemia with exercise stress test. No stress induced arrhythmias. Stress terminated due to THR (>85% MPHR)/MPHR met. The patient exercised according to Bruce Protocol, Total time recorded 06:50 min achieving max heart rate of 158 which was 106% of MPHR for age and 8.34 METS of work.  Assessment      ICD-10-CM   1. Paroxysmal atrial fibrillation (HCC)  I48.0 EKG 12-Lead   CHA2DS2-VASc Score is 3.  Yearly risk of stroke: 3.2%.( HTN;= 1; Age  >75 =2)  2. S/P ablation of atrial fibrillation   Z98.890    Z86.79   3. CKD (chronic kidney disease) stage 3, GFR 30-59 ml/min (HCC)  Z61.0 Basic metabolic panel  4. Essential hypertension  I10   5. Therapeutic drug monitoring  Z51.81 Magnesium    EKG 01/31/2019: Atrial fibrillation with controlled ventricular response at the rate  of 86 bpm, left axis deviation, left and physical block.  Poor R-wave progression, cannot exclude anteroseptal infarct old.  Pulmonary disease pattern with low voltage, no evidence of ischemia.  EKG 06/28/2018: Sinus rhythm with first-degree AV block at the rate of 58 bpm, normal axis, normal QT interval. Low-voltage complexes. No significant change from EKG 12/21/2017. Recommendation:    Patient called our office stating that over the past 5 days he's been atrial fibrillation, he is mildly symptomatic with dyspnea and fatigue.  He would like to proceed with direct current cardioversion.  His blood pressure is low, advised him to hold lisinopril for now and we'll reconsider starting lisinopril back once his blood pressure more stable.  He is tolerating anticoagulation without bleeding complication.  No changes in the medications were done today.  His labs were reviewed from July 2020, renal function is remained stable, CBC is also normal.  I'll see him back after cardioversion to be scheduled in next couple days and OV in one month.    Adrian Prows, MD, [facility] 01/31/2019, 11:28 AM [facility] [place] Pager: [phone] Office: [phone] If no answer Cell [phone]

## 2019-01-31 NOTE — Progress Notes (Signed)
Primary Physician/Referring:  Christain Sacramento, MD  Patient ID: Gregory [name], male    DOB: Nov 05, 1940, 78 y.o.   MRN: [id number]  Subjective   Chief Complaint  Patient presents with  . Atrial Fibrillation    [name]  is a 78 y.o. male   Caucasian male with history of paroxysmal atrial fibrillation. He has history of multiple cardioversions and eventually dofetilide was discontinued by Dr. [name] due to efficacy, he is presently on Multaq and has maintained sinus since Dec 2018.   He is tolerating anticoagulation with Eliquis, No GI bleed, no dark stools. His other history includes stage III chronic kidney disease. There is no history of hypertension or diabetes mellitus.  He called our office 5 days ago stating that his back in atrial fibrillation.  We made him come into the office, if he still persisted in atrial fibrillation, he presents for follow-up.  States that he has noticed mild fatigue and dyspnea since being in atrial fibrillation and he feels his pulse to be very regular but states that it is not going fast.  Past Medical History:  Diagnosis Date  . Arthritis    "right knee" (01/28/2016)  . CAP (community acquired pneumonia) 2013  . Chronic atrial fibrillation   . Diverticulosis of colon (without mention of hemorrhage)   . Dysrhythmia   . High blood pressure   . Internal hemorrhoids without mention of complication   . Personal history of colonic polyps 01/17/2002   adenomatous, tublar adenoma 02/10/2007    Past Surgical History:  Procedure Laterality Date  . ATRIAL FIBRILLATION ABLATION  11/28/2016  . ATRIAL FIBRILLATION ABLATION N/A 11/28/2016   Procedure: Atrial Fibrillation Ablation;  Surgeon: Constance Haw, MD;  Location: [facility];  Service: Cardiovascular;  Laterality: N/A;  . CARDIOVERSION  05/03/2012   Procedure: CARDIOVERSION;  Surgeon: Laverda Page, MD;  Location: [facility];  Service: Cardiovascular;  Laterality:  N/A;  . CARDIOVERSION  06/08/2012   Procedure: CARDIOVERSION;  Surgeon: Laverda Page, MD;  Location: [place];  Service: Cardiovascular;  Laterality: N/A;  [name]  . CARDIOVERSION N/A 10/28/2013   Procedure: CARDIOVERSION;  Surgeon: Laverda Page, MD;  Location: [place];  Service: Cardiovascular;  Laterality: N/A;  . CARDIOVERSION N/A 06/30/2014   Procedure: CARDIOVERSION;  Surgeon: Laverda Page, MD;  Location: [place];  Service: Cardiovascular;  Laterality: N/A;  H&P in file  . CARDIOVERSION N/A 09/05/2014   Procedure: CARDIOVERSION;  Surgeon: Adrian Prows, MD;  Location: [place];  Service: Cardiovascular;  Laterality: N/A;  . CARDIOVERSION N/A 12/24/2015   Procedure: CARDIOVERSION;  Surgeon: Adrian Prows, MD;  Location: [facility] ENDOSCOPY;  Service: Cardiovascular;  Laterality: N/A;  . CARDIOVERSION N/A 01/30/2016   Procedure: CARDIOVERSION;  Surgeon: Adrian Prows, MD;  Location: [facility] ENDOSCOPY;  Service: Cardiovascular;  Laterality: N/A;  . CARDIOVERSION N/A 02/08/2016   Procedure: CARDIOVERSION;  Surgeon: Adrian Prows, MD;  Location: [place];  Service: Cardiovascular;  Laterality: N/A;  . CATARACT EXTRACTION W/ INTRAOCULAR LENS  IMPLANT, BILATERAL Bilateral   . COLONOSCOPY    . INGUINAL HERNIA REPAIR Bilateral    as child  . KNEE ARTHROSCOPY Right 2004    Social History   Socioeconomic History  . Marital status: Married    Spouse name: Not on file  . Number of children: 2  . Years of education: Not on file  . Highest education level: Not on file  Occupational History    Employer: [employer]  Social Needs  . Financial resource strain: Not on file  . Food insecurity    Worry: Not on file    Inability: Not on file  . Transportation needs    Medical: Not on file    Non-medical: Not on file  Tobacco Use  . Smoking status: Never Smoker  . Smokeless tobacco: Never Used  Substance and Sexual Activity  . Alcohol use: Yes    Alcohol/week: 7.0 standard drinks     Types: 7 Glasses of wine per week  . Drug use: No  . Sexual activity: Not Currently  Lifestyle  . Physical activity    Days per week: Not on file    Minutes per session: Not on file  . Stress: Not on file  Relationships  . Social connections    Talks on phone: Not on file    Gets together: Not on file    Attends religious service: Not on file    Active member of club or organization: Not on file    Attends meetings of clubs or organizations: Not on file    Relationship status: Not on file  . Intimate partner violence    Fear of current or ex partner: Not on file    Emotionally abused: Not on file    Physically abused: Not on file    Forced sexual activity: Not on file  Other Topics Concern  . Not on file  Social History Narrative  . Not on file   Review of Systems  Constitution: Positive for malaise/fatigue (mild). Negative for chills, decreased appetite and weight gain.  Cardiovascular: Negative for dyspnea on exertion, leg swelling and syncope.  Respiratory: Positive for shortness of breath (mild).   Endocrine: Negative for cold intolerance.  Hematologic/Lymphatic: Does not bruise/bleed easily.  Musculoskeletal: Negative for joint swelling.  Gastrointestinal: Negative for abdominal pain, anorexia, change in bowel habit, hematochezia and melena.  Neurological: Negative for headaches and light-headedness.  Psychiatric/Behavioral: Negative for depression and substance abuse.  All other systems reviewed and are negative.  Objective  Blood pressure 110/75, pulse 80, height 5' 10.5" (1.791 m), weight 195 lb (88.5 kg). Body mass index is 27.58 kg/m. Physical exam not performed or limited due to virtual visit.  Patient appeared to be in no distress, Neck was supple, respiration was not labored.  Please see exam details from prior visit is as below.   Physical Exam  Constitutional: He appears well-developed and well-nourished. No distress.  HENT:  Head: Atraumatic.  Eyes:  Conjunctivae are normal.  Neck: Neck supple. No JVD present. No thyromegaly present.  Cardiovascular: Normal rate, S1 normal, S2 normal, intact distal pulses and normal pulses. An irregularly irregular rhythm present. Exam reveals no gallop.  Murmur heard.  Midsystolic murmur is present at the apex.  Early diastolic murmur is present with a grade of 2/6 at the upper left sternal border. S1 is variable, S2 is normal.  No carotid bruit, no leg edema.   Pulmonary/Chest: Effort normal and breath sounds normal.  Abdominal: Soft. Bowel sounds are normal.  Musculoskeletal: Normal range of motion.        General: No edema.  Neurological: He is alert.  Skin: Skin is warm and dry.  Psychiatric: He has a normal mood and affect.   Radiology: No results found.  Laboratory examination:   06/18/2018: TSH 1.69.  Normal H and H, MCV 98, MCH 33, CBC otherwise normal.  Creatinine 1.82, EGFR 35/41, potassium 5.2, CMP otherwise normal.  09/28/2017: Creatinine 1.84, EGFR   35/41, potassium 5.6, CMP otherwise normal.  Normal H&H, MCV 99, CBC otherwise normal.  05/23/2015: Total cholesterol 208, triglycerides 113, HDL 53, LDL 132, creatinine 1.53, eGFR 44, potassium 4.6, CMP otherwise normal, CBC normal, PSA elevated at 5.3   CMP Latest Ref Rng & Units 12/29/2018 06/04/2017 11/28/2016  Glucose 65 - 99 mg/dL 138(H) - 183(H)  BUN 8 - 27 mg/dL 24 - 26(H)  Creatinine 0.76 - 1.27 mg/dL 1.79(H) - 1.42(H)  Sodium 134 - 144 mmol/L 140 - 139  Potassium 3.5 - 5.2 mmol/L 4.2 - 4.1  Chloride 96 - 106 mmol/L 106 - 109  CO2 20 - 29 mmol/L 21 - 22  Calcium 8.6 - 10.2 mg/dL 9.1 - 8.5(L)  Total Protein 6.0 - 8.5 g/dL 6.3 6.7 -  Total Bilirubin 0.0 - 1.2 mg/dL 0.6 0.3 -  Alkaline Phos 39 - 117 IU/L 55 64 -  AST 0 - 40 IU/L 14 19 -  ALT 0 - 44 IU/L 14 25 -   CBC Latest Ref Rng & Units 12/29/2018 11/19/2016 01/29/2016  WBC 3.4 - 10.8 x10E3/uL 5.1 5.3 5.8  Hemoglobin 13.0 - 17.7 g/dL 14.6 14.5 14.4  Hematocrit 37.5 - 51.0 %  42.9 43.3 43.5  Platelets 150 - 450 x10E3/uL 191 204 216   Lipid Panel     Component Value Date/Time   CHOL 169 12/29/2018 0816   TRIG 83 12/29/2018 0816   HDL 49 12/29/2018 0816   LDLCALC 103 (H) 12/29/2018 0816   HEMOGLOBIN A1C No results found for: HGBA1C, MPG TSH No results for input(s): TSH in the last 8760 hours.  Medications   Current Outpatient Medications  Medication Instructions  . acetaminophen (TYLENOL) 650 mg, Oral, Every 6 hours PRN  . ELIQUIS 5 MG TABS tablet TAKE 1 TABLET BY MOUTH TWICE DAILY  . lisinopril (ZESTRIL) 5 mg, Oral, Daily  . Multaq 400 mg, Oral, 2 times daily with meals  . psyllium (METAMUCIL) 58.6 % packet 1 packet, Oral, Daily    Cardiac Studies:   Echocardiogram [02/26/2016]: 1. Left ventricle cavity is normal in size. Mild concentric hypertrophy of the left ventricle. Normal global wall motion. Calculated EF 68%. 2. Left atrial cavity is moderately dilated. 3. Mild to moderate mitral regurgitation. 4. Mild tricuspid regurgitation. No evidence of pulmonary hypertension. 5. Mild to moderate pulmonic regurgitation. 6. c.f. echo. of 07/19/2013, RV, RA sizes appear normal, TR is less severe, and, there is no pulmonary hypertension. MR appears slightly less severe.  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 Trifascicular block. Normal BP response. There was no ST-T changes of ischemia with exercise stress test. No stress induced arrhythmias. Stress terminated due to THR (>85% MPHR)/MPHR met. The patient exercised according to Bruce Protocol, Total time recorded 06:50 min achieving max heart rate of 158 which was 106% of MPHR for age and 8.34 METS of work.  Assessment      ICD-10-CM   1. Paroxysmal atrial fibrillation (HCC)  I48.0 EKG 12-Lead   CHA2DS2-VASc Score is 3.  Yearly risk of stroke: 3.2%.( HTN;= 1; Age  >75 =2)  2. S/P ablation of atrial fibrillation   Z98.890    Z86.79   3. CKD (chronic kidney disease) stage 3, GFR 30-59 ml/min (HCC)  Z61.0 Basic metabolic panel  4. Essential hypertension  I10   5. Therapeutic drug monitoring  Z51.81 Magnesium    EKG 01/31/2019: Atrial fibrillation with controlled ventricular response at the rate  of 86 bpm, left axis deviation, left and physical block.  Poor R-wave progression, cannot exclude anteroseptal infarct old.  Pulmonary disease pattern with low voltage, no evidence of ischemia.  EKG 06/28/2018: Sinus rhythm with first-degree AV block at the rate of 58 bpm, normal axis, normal QT interval. Low-voltage complexes. No significant change from EKG 12/21/2017. Recommendation:    Patient called our office stating that over the past 5 days he's been atrial fibrillation, he is mildly symptomatic with dyspnea and fatigue.  He would like to proceed with direct current cardioversion.  His blood pressure is low, advised him to hold lisinopril for now and we'll reconsider starting lisinopril back once his blood pressure more stable.  He is tolerating anticoagulation without bleeding complication.  No changes in the medications were done today.  His labs were reviewed from July 2020, renal function is remained stable, CBC is also normal.  I'll see him back after cardioversion to be scheduled in next couple days and OV in one month.    Adrian Prows, MD, [facility] 01/31/2019, 11:28 AM [facility]. [place] Pager: [phone] Office: [phone] If no answer Cell [phone]

## 2019-02-03 DIAGNOSIS — N183 Chronic kidney disease, stage 3 (moderate): Secondary | ICD-10-CM | POA: Diagnosis not present

## 2019-02-03 DIAGNOSIS — Z5181 Encounter for therapeutic drug level monitoring: Secondary | ICD-10-CM | POA: Diagnosis not present

## 2019-02-04 ENCOUNTER — Other Ambulatory Visit (HOSPITAL_COMMUNITY)
Admission: RE | Admit: 2019-02-04 | Discharge: 2019-02-04 | Disposition: A | Discharge status: Home / Self Care | Payer: Medicare Other | Source: Ambulatory Visit | Attending: Cardiology | Admitting: Cardiology

## 2019-02-04 DIAGNOSIS — Z01812 Encounter for preprocedural laboratory examination: Secondary | ICD-10-CM | POA: Diagnosis not present

## 2019-02-04 DIAGNOSIS — Z20828 Contact with and (suspected) exposure to other viral communicable diseases: Secondary | ICD-10-CM | POA: Diagnosis not present

## 2019-02-04 LAB — BASIC METABOLIC PANEL
BUN/Creatinine Ratio: 14 (ref 10–24)
BUN: 24 mg/dL (ref 8–27)
CO2: 20 mmol/L (ref 20–29)
Calcium: 8.9 mg/dL (ref 8.6–10.2)
Chloride: 105 mmol/L (ref 96–106)
Creatinine, Ser: 1.73 mg/dL — ABNORMAL HIGH (ref 0.76–1.27)
GFR calc Af Amer: 43 mL/min/{1.73_m2} — ABNORMAL LOW (ref >59)
GFR calc non Af Amer: 37 mL/min/{1.73_m2} — ABNORMAL LOW (ref >59)
Glucose: 128 mg/dL — ABNORMAL HIGH (ref 65–99)
Potassium: 4.2 mmol/L (ref 3.5–5.2)
Sodium: 141 mmol/L (ref 134–144)

## 2019-02-04 LAB — SARS CORONAVIRUS 2 (TAT 6-24 HRS): SARS Coronavirus 2: NEGATIVE

## 2019-02-04 LAB — MAGNESIUM: Magnesium: 2.4 mg/dL — ABNORMAL HIGH (ref 1.6–2.3)

## 2019-02-07 ENCOUNTER — Telehealth: Payer: Self-pay

## 2019-02-07 NOTE — Telephone Encounter (Signed)
Negative test result. No COVID

## 2019-02-07 NOTE — Telephone Encounter (Signed)
Pt called wanting to know will his covid test results be back in time for cardioversion; Also he wants to know does he need to be fasting? Per Devonna I told him what to do

## 2019-02-07 NOTE — Progress Notes (Signed)
Pt states they have been quarantined since covid test. Denies fever/shortness of breath, cough. Pt understands visitor policy.  

## 2019-02-08 ENCOUNTER — Encounter (HOSPITAL_COMMUNITY): Payer: Self-pay | Admitting: *Deleted

## 2019-02-08 ENCOUNTER — Ambulatory Visit (HOSPITAL_COMMUNITY)
Admission: RE | Admit: 2019-02-08 | Discharge: 2019-02-08 | Disposition: A | Discharge status: Home / Self Care | Payer: Medicare Other | Attending: Cardiology | Admitting: Cardiology

## 2019-02-08 ENCOUNTER — Other Ambulatory Visit: Payer: Self-pay

## 2019-02-08 ENCOUNTER — Encounter (HOSPITAL_COMMUNITY)
Admission: RE | Disposition: A | Discharge status: Home / Self Care | Payer: Self-pay | Source: Home / Self Care | Attending: Cardiology

## 2019-02-08 ENCOUNTER — Ambulatory Visit (HOSPITAL_COMMUNITY): Payer: Medicare Other | Admitting: Anesthesiology

## 2019-02-08 DIAGNOSIS — N183 Chronic kidney disease, stage 3 (moderate): Secondary | ICD-10-CM | POA: Insufficient documentation

## 2019-02-08 DIAGNOSIS — M1711 Unilateral primary osteoarthritis, right knee: Secondary | ICD-10-CM | POA: Diagnosis not present

## 2019-02-08 DIAGNOSIS — Z79899 Other long term (current) drug therapy: Secondary | ICD-10-CM | POA: Insufficient documentation

## 2019-02-08 DIAGNOSIS — I129 Hypertensive chronic kidney disease with stage 1 through stage 4 chronic kidney disease, or unspecified chronic kidney disease: Secondary | ICD-10-CM | POA: Diagnosis not present

## 2019-02-08 DIAGNOSIS — Z7901 Long term (current) use of anticoagulants: Secondary | ICD-10-CM | POA: Diagnosis not present

## 2019-02-08 DIAGNOSIS — I480 Paroxysmal atrial fibrillation: Secondary | ICD-10-CM | POA: Insufficient documentation

## 2019-02-08 HISTORY — PX: CARDIOVERSION: SHX1299

## 2019-02-08 SURGERY — CARDIOVERSION
Anesthesia: General

## 2019-02-08 MED ORDER — SODIUM CHLORIDE 0.9 % IV SOLN
INTRAVENOUS | Status: AC | PRN
  Administered 2019-02-08: 500 mL via INTRAVENOUS

## 2019-02-08 MED ORDER — SODIUM CHLORIDE 0.9 % IV SOLN
INTRAVENOUS | Status: DC | PRN
  Administered 2019-02-08: 12:00:00 via INTRAVENOUS

## 2019-02-08 MED ORDER — LIDOCAINE HCL (CARDIAC) PF 100 MG/5ML IV SOSY
PREFILLED_SYRINGE | INTRAVENOUS | Status: DC | PRN
  Administered 2019-02-08: 40 mg via INTRAVENOUS

## 2019-02-08 MED ORDER — PROPOFOL 10 MG/ML IV BOLUS
INTRAVENOUS | Status: DC | PRN
  Administered 2019-02-08: 60 mg via INTRAVENOUS

## 2019-02-08 NOTE — Interval H&P Note (Signed)
History and Physical Interval Note:  02/08/2019 12:53 PM  Gregory [name]  has presented today for surgery, with the diagnosis of AFIB.  The various methods of treatment have been discussed with the patient and family. After consideration of risks, benefits and other options for treatment, the patient has consented to  Procedure(s): CARDIOVERSION (N/A) as a surgical intervention.  The patient's history has been reviewed, patient examined, no change in status, stable for surgery.  I have reviewed the patient's chart and labs.  Questions were answered to the patient's satisfaction.     Adrian Prows

## 2019-02-08 NOTE — CV Procedure (Signed)
Direct current cardioversion:  Indication symptomatic A. Fibrillation.  Procedure: Using 60 mg of IV Propofol and 40 IV Lidocaine (for reducing venous pain) for achieving deep sedation, synchronized direct current cardioversion performed. Patient was delivered with 120 Joules of electricity X 1 with success to NSR. Patient tolerated the procedure well. No immediate complication noted.   

## 2019-02-08 NOTE — Anesthesia Postprocedure Evaluation (Signed)
Anesthesia Post Note  Patient: Gregory [name]  Procedure(s) Performed: CARDIOVERSION (N/A )     Patient location during evaluation: PACU Anesthesia Type: General Level of consciousness: awake and alert Pain management: pain level controlled Vital Signs Assessment: post-procedure vital signs reviewed and stable Respiratory status: spontaneous breathing, nonlabored ventilation, respiratory function stable and patient connected to nasal cannula oxygen Cardiovascular status: blood pressure returned to baseline and stable Postop Assessment: no apparent nausea or vomiting Anesthetic complications: no    Last Vitals:  Vitals:   02/08/19 1305 02/08/19 1316  BP: 105/72 116/77  Pulse: (!) 55 63  Resp: 20 (!) 25  Temp: 36.6 C   SpO2: 98% 97%    Last Pain:  Vitals:   02/08/19 1316  TempSrc:   PainSc: 0-No pain                 Tiajuana Amass

## 2019-02-08 NOTE — Anesthesia Preprocedure Evaluation (Signed)
Anesthesia Evaluation  Patient identified by MRN, date of birth, ID band Patient awake    Reviewed: Allergy & Precautions, NPO status , Patient's Chart, lab work & pertinent test results  Airway Mallampati: II  TM Distance: >3 FB     Dental  (+) Dental Advisory Given   Pulmonary neg pulmonary ROS,    breath sounds clear to auscultation       Cardiovascular hypertension, Pt. on medications + dysrhythmias Atrial Fibrillation  Rhythm:Irregular Rate:Normal     Neuro/Psych  Neuromuscular disease    GI/Hepatic negative GI ROS, Neg liver ROS,   Endo/Other  negative endocrine ROS  Renal/GU Renal disease     Musculoskeletal  (+) Arthritis ,   Abdominal   Peds  Hematology negative hematology ROS (+)   Anesthesia Other Findings   Reproductive/Obstetrics                             Anesthesia Physical Anesthesia Plan  ASA: III  Anesthesia Plan: General   Post-op Pain Management:    Induction: Intravenous  PONV Risk Score and Plan: 2 and Treatment may vary due to age or medical condition  Airway Management Planned: Mask and Natural Airway  Additional Equipment:   Intra-op Plan:   Post-operative Plan:   Informed Consent: I have reviewed the patients History and Physical, chart, labs and discussed the procedure including the risks, benefits and alternatives for the proposed anesthesia with the patient or authorized representative who has indicated his/her understanding and acceptance.       Plan Discussed with: CRNA  Anesthesia Plan Comments:         Anesthesia Quick Evaluation

## 2019-02-08 NOTE — Discharge Instructions (Signed)

## 2019-02-08 NOTE — Transfer of Care (Signed)
Immediate Anesthesia Transfer of Care Note  Patient: Gregory [name]  Procedure(s) Performed: CARDIOVERSION (N/A )  Patient Location: Endoscopy Unit  Anesthesia Type:General  Level of Consciousness: awake and alert   Airway & Oxygen Therapy: Patient Spontanous Breathing and Patient connected to nasal cannula oxygen  Post-op Assessment: Report given to RN and Post -op Vital signs reviewed and stable  Post vital signs: Reviewed and stable  Last Vitals:  Vitals Value Taken Time  BP    Temp    Pulse 55 02/08/19 1303  Resp 18 02/08/19 1303  SpO2 98 % 02/08/19 1303    Last Pain:  Vitals:   02/08/19 1147  TempSrc: Temporal  PainSc: 0-No pain         Complications: No apparent anesthesia complications

## 2019-02-09 ENCOUNTER — Encounter (HOSPITAL_COMMUNITY): Payer: Self-pay | Admitting: Cardiology

## 2019-02-10 ENCOUNTER — Telehealth: Payer: Self-pay

## 2019-02-10 NOTE — Telephone Encounter (Signed)
Pt called stating that he just had a cardioversion and he went out of rhythm yesterday and he knows its not regular; He says that he doesn't think its tachycardia he just knows that its not regular; He said he didn't expect it to go out of rhythm so fast Please advise

## 2019-02-10 NOTE — Telephone Encounter (Signed)
Hi Gregory [name], You had discontinued Amio in I think Sept 2019 after A. Fib ablation and placed him on Multaq. Had done well until now and now in A. FIb and I cardioverted him 3 days ago and now he calls me with recurrent A, F.  Do you want to see him or should I switch to Amio?? JG

## 2019-02-10 NOTE — Telephone Encounter (Signed)
He may need another A. Fib ablation and can arrange EP consult. He was on Amiodarone until 03/03/18 and stopped by Dr. [name]. I do not recall why. May be he needs to be back on Amio

## 2019-02-10 NOTE — Telephone Encounter (Signed)
He says he was never told why they switched from amio to Amery; What should we do set him up with EP or change his meds? If so to what ?

## 2019-02-10 NOTE — Telephone Encounter (Signed)
Please read and let me know what dose of amio u want to send in

## 2019-02-11 NOTE — Telephone Encounter (Signed)
Can you please set up a visit with you, he is symptomatic and very reliable. Maybe you can talk to him on Monday and also arrange procedure or OV with you. JG

## 2019-02-11 NOTE — Telephone Encounter (Signed)
If he is okay with amiodarone, okay to restart that.  My only other thought is would he benefit from Tigas and or repeat ablation.  I am happy to talk to him if you feel like that is necessary.

## 2019-02-14 ENCOUNTER — Telehealth: Payer: Self-pay | Admitting: Cardiology

## 2019-02-14 NOTE — Telephone Encounter (Signed)
New message:      Patient calling to a new patient appt. Please call patient.

## 2019-02-14 NOTE — Telephone Encounter (Signed)
Gregory [name] work to get him seen. Thanks.

## 2019-02-15 NOTE — Telephone Encounter (Signed)
Scheduled for 9/8.

## 2019-02-15 NOTE — Telephone Encounter (Signed)
From patient.

## 2019-02-15 NOTE — Telephone Encounter (Signed)
Forwarding to EP scheduler to arrange OV

## 2019-02-22 ENCOUNTER — Encounter: Payer: Self-pay | Admitting: Cardiology

## 2019-02-22 ENCOUNTER — Other Ambulatory Visit: Payer: Self-pay

## 2019-02-22 ENCOUNTER — Ambulatory Visit (INDEPENDENT_AMBULATORY_CARE_PROVIDER_SITE_OTHER): Payer: Medicare Other | Admitting: Cardiology

## 2019-02-22 VITALS — BP 110/78 | HR 109 | Ht 70.5 in | Wt 190.8 lb

## 2019-02-22 DIAGNOSIS — I4819 Other persistent atrial fibrillation: Secondary | ICD-10-CM | POA: Diagnosis not present

## 2019-02-22 DIAGNOSIS — Z01812 Encounter for preprocedural laboratory examination: Secondary | ICD-10-CM | POA: Diagnosis not present

## 2019-02-22 LAB — BASIC METABOLIC PANEL
BUN/Creatinine Ratio: 16 (ref 10–24)
BUN: 25 mg/dL (ref 8–27)
CO2: 22 mmol/L (ref 20–29)
Calcium: 9.5 mg/dL (ref 8.6–10.2)
Chloride: 104 mmol/L (ref 96–106)
Creatinine, Ser: 1.6 mg/dL — ABNORMAL HIGH (ref 0.76–1.27)
GFR calc Af Amer: 47 mL/min/{1.73_m2} — ABNORMAL LOW (ref >59)
GFR calc non Af Amer: 41 mL/min/{1.73_m2} — ABNORMAL LOW (ref >59)
Glucose: 83 mg/dL (ref 65–99)
Potassium: 4.9 mmol/L (ref 3.5–5.2)
Sodium: 138 mmol/L (ref 134–144)

## 2019-02-22 LAB — CBC
Hematocrit: 45.5 % (ref 37.5–51.0)
Hemoglobin: 15 g/dL (ref 13.0–17.7)
MCH: 32.7 pg (ref 26.6–33.0)
MCHC: 33 g/dL (ref 31.5–35.7)
MCV: 99 fL — ABNORMAL HIGH (ref 79–97)
Platelets: 219 10*3/uL (ref 150–450)
RBC: 4.59 x10E6/uL (ref 4.14–5.80)
RDW: 13.3 % (ref 11.6–15.4)
WBC: 4.9 10*3/uL (ref 3.4–10.8)

## 2019-02-22 MED ORDER — METOPROLOL TARTRATE 50 MG PO TABS: 50.0000 mg | ORAL_TABLET | Freq: Once | ORAL | 0 refills | Status: DC

## 2019-02-22 NOTE — Telephone Encounter (Signed)
Please read

## 2019-02-22 NOTE — Patient Instructions (Addendum)
Medication Instructions:  Your physician has recommended you make the following change in your medication:  1. STOP Multaq  Labwork: Pre procedure lab today:  BMET & CBC If you have labs (blood work) drawn today and your tests are completely normal, you will receive your results only by:  MyChart Message (if you have MyChart) OR  A paper copy in the mail If you have any lab test that is abnormal or we need to change your treatment, we will call you to review the results.  Testing/Procedures: Your physician has requested that you have cardiac CT. Cardiac computed tomography (CT) is a painless test that uses an x-ray machine to take clear, detailed pictures of your heart. For further information please visit [email or web address]. Please follow instructions below.  Your physician has recommended that you have an ablation. Catheter ablation is a medical procedure used to treat some cardiac arrhythmias (irregular heartbeats). During catheter ablation, a long, thin, flexible tube is put into a blood vessel in your groin (upper thigh), or neck. This tube is called an ablation catheter. It is then guided to your heart through the blood vessel. Radio frequency waves destroy small areas of heart tissue where abnormal heartbeats may cause an arrhythmia to start. Please review instructions below.   Follow-Up: Your physician recommends that you schedule a follow-up appointment in: 4 weeks, after your procedure on 03/18/19, with the AFib clinic.  Your physician recommends that you schedule a follow-up appointment in: 3 months, after your procedure on 03/18/19,  with Dr. Elberta Fortisamnitz.  * If you need a refill on your cardiac medications before your next appointment, please call your pharmacy.   *Please note that any paperwork needing to be filled out by the provider will need to be addressed at the front desk prior to seeing the provider. Please note that any FMLA, disability or other documents regarding health  condition is subject to a $25.00 charge that must be received prior to completion of paperwork in the form of a money order or check.  Thank you for choosing [facility]!!   Dory HornSherri Dagon Budai, RN [phone]) [phone]  Any Other Special Instructions Will Be Listed Below (If Applicable).  CT INSTRUCTIONS  Your cardiac CT will be scheduled at:   [facility] [address] [place], [place] [address] [phone]  [facility] at 1:00 pm  (this is 30-45 minutes prior to test start time.) Proceed to the [facility] Department (first floor) to check-in and test prep.  Please follow these instructions carefully (unless otherwise directed):  Hold all erectile dysfunction medications at least 48 hours prior to test.  On the Night Before the Test:  Be sure to Drink plenty of water.  Do not consume any caffeinated/decaffeinated beverages or chocolate 12 hours prior to your test.  Do not take any antihistamines 12 hours prior to your test.  If you take Metformin do not take 24 hours prior to test.  On the Day of the Test:  Drink plenty of water. Do not drink any water within one hour of the test.  Do not eat any food 4 hours prior to the test.  You may take your regular medications prior to the test.   Take metoprolol (Lopressor) 50 mg two hours prior to test.  HOLD Furosemide/Hydrochlorothiazide morning of the test.  After the Test:  Drink plenty of water.  After receiving IV contrast, you may experience a mild flushed feeling.  This is normal.  On occasion, you may experience a mild rash up to 24 hours after the test. This is not dangerous. If this occurs, you can take Benadryl 25 mg and increase your fluid intake.  If you experience trouble breathing, this can be serious. If it is severe call 911 IMMEDIATELY. If it is mild, please call our office.  If you take any of these medications:  Glipizide/Metformin, Avandament, Glucavance, please do not take 48 hours after completing test.  Please contact the cardiac imaging nurse navigator should you have any questions/concerns Rockwell AlexandriaSara Wallace, RN Navigator Cardiac Imaging [facility] [phone] Office  [phone] Cell      Electrophysiology/Ablation Procedure Instructions   You are scheduled for a(n) AFib ablation on 03/18/19 with Dr. Loman BrooklynWill Camnitz.   1.   Pre procedure testing-             A.  LAB WORK --- On 02/22/19  for your pre procedure blood work.                 B. COVID TEST-- On 03/15/19 @ 12:05 pm - [facility] to [facility] ([address], [place]) for your Covid testing.   This is a drive thru test site.  There will be multiple testing areas.  Be sure to share with the first checkpoint that you are there for pre-procedure/surgery testing. This will put you into the right (yellow) lane that leads to the PAT testing team. Stay in your car and the nurse team will come to your car to test you.  After you are tested please go home and self quarantine until the day of your procedure.     2. On the day of your procedure 03/18/19 you will go to [facility] [phone]([address]) at 5:30 am.  Bonita QuinYou will go to the main entrance [employer](North Tower) and enter where the [employer] parking staff are.  Your driver will drop you off and you will head down the hallway to ADMITTING.  You may have one support person come in to the hospital with you.  They will be asked to wait in the waiting room.   3.   Do not eat or drink after midnight prior to your procedure.   4.   Continue taking your Eliquis, you will take this until the day of your procedure.  Do NOT take any medications the morning of your procedure.   5.  Plan for an overnight stay.  If you use your phone frequently bring your phone charger.   6. You will follow up with the AFIB clinic 4 weeks after your procedure.  You will follow up with  Dr. Elberta Fortisamnitz  3 months after your procedure.  These appointments will be made for you.   * If you have ANY questions please call the office [phone] and ask for Etheridge Geil RN or send me a MyChart message   * Occasionally, EP Studies and ablations can become lengthy.  Please make your family aware of this before your procedure starts.  Average time ranges from 2-8 hours for EP studies/ablations.  Your physician will call your family after the procedure with the results.                                      Cardiac Ablation Cardiac ablation is a procedure to disable (ablate)  a small amount of heart tissue in very specific places. The heart has many electrical connections. Sometimes these connections are abnormal and can cause the heart to beat very fast or irregularly. Ablating some of the problem areas can improve the heart rhythm or return it to normal. Ablation may be done for people who:  Have Wolff-Parkinson-White syndrome.  Have fast heart rhythms (tachycardia).  Have taken medicines for an abnormal heart rhythm (arrhythmia) that were not effective or caused side effects.  Have a high-risk heartbeat that may be life-threatening.  During the procedure, a small incision is made in the neck or the groin, and a long, thin, flexible tube (catheter) is inserted into the incision and moved to the heart. Small devices (electrodes) on the tip of the catheter will send out electrical currents. A type of X-ray (fluoroscopy) will be used to help guide the catheter and to provide images of the heart. Tell a health care provider about:  Any allergies you have.  All medicines you are taking, including vitamins, herbs, eye drops, creams, and over-the-counter medicines.  Any problems you or family members have had with anesthetic medicines.  Any blood disorders you have.  Any surgeries you have had.  Any medical conditions you have, such as kidney failure.  Whether you are pregnant or may be  pregnant. What are the risks? Generally, this is a safe procedure. However, problems may occur, including:  Infection.  Bruising and bleeding at the catheter insertion site.  Bleeding into the chest, especially into the sac that surrounds the heart. This is a serious complication.  Stroke or blood clots.  Damage to other structures or organs.  Allergic reaction to medicines or dyes.  Need for a permanent pacemaker if the normal electrical system is damaged. A pacemaker is a small computer that sends electrical signals to the heart and helps your heart beat normally.  The procedure not being fully effective. This may not be recognized until months later. Repeat ablation procedures are sometimes required.  What happens before the procedure?  Follow instructions from your health care provider about eating or drinking restrictions.  Ask your health care provider about: ? Changing or stopping your regular medicines. This is especially important if you are taking diabetes medicines or blood thinners. ? Taking medicines such as aspirin and ibuprofen. These medicines can thin your blood. Do not take these medicines before your procedure if your health care provider instructs you not to.  Plan to have someone take you home from the hospital or clinic.  If you will be going home right after the procedure, plan to have someone with you for 24 hours. What happens during the procedure?  To lower your risk of infection: ? Your health care team will wash or sanitize their hands. ? Your skin will be washed with soap. ? Hair may be removed from the incision area.  An IV tube will be inserted into one of your veins.  You will be given a medicine to help you relax (sedative).  The skin on your neck or groin will be numbed.  An incision will be made in your neck or your groin.  A needle will be inserted through the incision and into a large vein in your neck or groin.  A catheter will be  inserted into the needle and moved to your heart.  Dye may be injected through the catheter to help your surgeon see the area of the heart that needs treatment.  Electrical currents will  be sent from the catheter to ablate heart tissue in desired areas. There are three types of energy that may be used to ablate heart tissue: ? Heat (radiofrequency energy). ? Laser energy. ? Extreme cold (cryoablation).  When the necessary tissue has been ablated, the catheter will be removed.  Pressure will be held on the catheter insertion area to prevent excessive bleeding.  A bandage (dressing) will be placed over the catheter insertion area. The procedure may vary among health care providers and hospitals. What happens after the procedure?  Your blood pressure, heart rate, breathing rate, and blood oxygen level will be monitored until the medicines you were given have worn off.  Your catheter insertion area will be monitored for bleeding. You will need to lie still for a few hours to ensure that you do not bleed from the catheter insertion area.  Do not drive for 5-7 days or as long as directed by your health care provider. Summary  Cardiac ablation is a procedure to disable (ablate) a small amount of heart tissue in very specific places. Ablating some of the problem areas can improve the heart rhythm or return it to normal.  During the procedure, electrical currents will be sent from the catheter to ablate heart tissue in desired areas. This information is not intended to replace advice given to you by your health care provider. Make sure you discuss any questions you have with your health care provider. Document Released: 10/19/2008 Document Revised: 04/21/2016 Document Reviewed: 04/21/2016 Elsevier Interactive Patient Education  [employer].

## 2019-02-22 NOTE — Addendum Note (Signed)
Addended by: Marciano Sequin on: 02/22/2019 09:44 AM   Modules accepted: Orders

## 2019-02-22 NOTE — Progress Notes (Signed)
Electrophysiology Office Note   Date:  02/22/2019   ID:  Gregory [name], DOB March 13, 1941, MRN [id number]  PCP:  Christain Sacramento, MD  Cardiologist:  Einar Gip Primary Electrophysiologist:  Constance Haw, MD    No chief complaint on file.    History of Present Illness: Gregory [name] is a 78 y.o. male who presents today for electrophysiology evaluation.   He has history of atrial fibrillation, hypertension. He has had multiple cardioversions and has since been put on dofetilide. He was having breakthrough episodes of atrial fibrillation and had ablation on 11/28/16.  At his last visit, he presented to clinic in atrial fibrillation.  Dofetilide was stopped and he was started on amiodarone.  He has unfortunately gone in and out of atrial fibrillation since that time.  He is symptomatic with weakness, fatigue, and shortness of breath.  Today, denies symptoms of palpitations, chest pain,  orthopnea, PND, lower extremity edema, claudication, dizziness, presyncope, syncope, bleeding, or neurologic sequela. The patient is tolerating medications without difficulties.  Unfortunately he has gone back out of rhythm.  He does get short of breath with exertion.  He feels well while at rest.  Past Medical History:  Diagnosis Date  . Arthritis    "right knee" (01/28/2016)  . CAP (community acquired pneumonia) 2013  . Chronic atrial fibrillation   . Diverticulosis of colon (without mention of hemorrhage)   . Dysrhythmia   . High blood pressure   . Internal hemorrhoids without mention of complication   . Personal history of colonic polyps 01/17/2002   adenomatous, tublar adenoma 02/10/2007   Past Surgical History:  Procedure Laterality Date  . ATRIAL FIBRILLATION ABLATION  11/28/2016  . ATRIAL FIBRILLATION ABLATION N/A 11/28/2016   Procedure: Atrial Fibrillation Ablation;  Surgeon: Constance Haw, MD;  Location: [place] [facility] LAB;  Service: Cardiovascular;  Laterality: N/A;  .  CARDIOVERSION  05/03/2012   Procedure: CARDIOVERSION;  Surgeon: Laverda Page, MD;  Location: [facility] ENDOSCOPY;  Service: Cardiovascular;  Laterality: N/A;  . CARDIOVERSION  06/08/2012   Procedure: CARDIOVERSION;  Surgeon: Laverda Page, MD;  Location: [place];  Service: Cardiovascular;  Laterality: N/A;  Gregory [name]  . CARDIOVERSION N/A 10/28/2013   Procedure: CARDIOVERSION;  Surgeon: Laverda Page, MD;  Location: [place];  Service: Cardiovascular;  Laterality: N/A;  . CARDIOVERSION N/A 06/30/2014   Procedure: CARDIOVERSION;  Surgeon: Laverda Page, MD;  Location: [place];  Service: Cardiovascular;  Laterality: N/A;  H&P in file  . CARDIOVERSION N/A 09/05/2014   Procedure: CARDIOVERSION;  Surgeon: Adrian Prows, MD;  Location: [place];  Service: Cardiovascular;  Laterality: N/A;  . CARDIOVERSION N/A 12/24/2015   Procedure: CARDIOVERSION;  Surgeon: Adrian Prows, MD;  Location: [place];  Service: Cardiovascular;  Laterality: N/A;  . CARDIOVERSION N/A 01/30/2016   Procedure: CARDIOVERSION;  Surgeon: Adrian Prows, MD;  Location[facility] END[facility]COPY;  Service: Cardiovascular;  Laterality: N/A;  . CARDIOVERSION N/A 02/08/2016   Procedure: CARDIOVERSION;  Surgeon: Adrian Prows, MD;  Location: [facility] ENDOSCOPY;  Service: Cardiovascular;  Laterality: N/A;  . CARDIOVERSION N/A 02/08/2019   Procedure: CARDIOVERSION;  Surgeon: Adrian Prows, MD;  Location: [place];  Service: Cardiovascular;  Laterality: N/A;  . CATARACT EXTRACTION W/ INTRAOCULAR LENS  IMPLANT, BILATERAL Bilateral   . COLONOSCOPY    . INGUINAL HERNIA REPAIR Bilateral    as child  . KNEE ARTHROSCOPY Right 2004     Current Outpatient Medications  Medication Sig Dispense Refill  . acetaminophen (TYLENOL) 325  MG tablet Take 650 mg by mouth every 6 (six) hours as needed for headache.     Marland Kitchen. ELIQUIS 5 MG TABS tablet TAKE 1 TABLET BY MOUTH TWICE DAILY 180 tablet 1  . psyllium (METAMUCIL) 58.6 % packet Take 1 packet by mouth  daily.    . metoprolol tartrate (LOPRESSOR) 50 MG tablet Take 1 tablet (50 mg total) by mouth once for 1 dose. Two hours prior to CT test 1 tablet 0   No current facility-administered medications for this visit.     Allergies:   Patient has no known allergies.   Social History:  The patient  reports that he has never smoked. He has never used smokeless tobacco. He reports current alcohol use of about 7.0 standard drinks of alcohol per week. He reports that he does not use drugs.   Family History:  The patient's family history includes Cancer in his mother; Heart disease in his father; Stroke in his father.   ROS:  Please see the history of present illness.   Otherwise, review of systems is positive for none.   All other systems are reviewed and negative.   PHYSICAL EXAM: VS:  BP 110/78   Pulse (!) 109   Ht 5' 10.5" (1.791 m)   Wt 190 lb 12.8 oz (86.5 kg)   SpO2 98%   BMI 26.99 kg/m  , BMI Body mass index is 26.99 kg/m. GEN: Well nourished, well developed, in no acute distress  HEENT: normal  Neck: no JVD, carotid bruits, or masses Cardiac: iRRR; no murmurs, rubs, or gallops,no edema  Respiratory:  clear to auscultation bilaterally, normal work of breathing GI: soft, nontender, nondistended, + BS MS: no deformity or atrophy  Skin: warm and dry Neuro:  Strength and sensation are intact Psych: euthymic mood, full affect  EKG:  EKG is ordered today. Personal review of the ekg ordered shows AF, rate 109   Recent Labs: 12/29/2018: ALT 14; Hemoglobin 14.6; Platelets 191 02/03/2019: BUN 24; Creatinine, Ser 1.73; Magnesium 2.4; Potassium 4.2; Sodium 141    Lipid Panel     Component Value Date/Time   CHOL 169 12/29/2018 0816   TRIG 83 12/29/2018 0816   HDL 49 12/29/2018 0816   LDLCALC 103 (H) 12/29/2018 0816     Wt Readings from Last 3 Encounters:  02/22/19 190 lb 12.8 oz (86.5 kg)  02/08/19 195 lb (88.5 kg)  01/31/19 195 lb (88.5 kg)      Other studies Reviewed:  Additional studies/ records that were reviewed today include: TTE 03/02/16  Review of the above records today demonstrates:  Mild concentric LVH. EF 68%. Moderately dilated left atrium. Mild tricuspid regurgitation without evidence of pulmonary hypertension.   ASSESSMENT AND PLAN:  1.  Persistent atrial fibrillation: Currently on Eliquis and Multitak.  He is in atrial fibrillation and has become persistent again.  We will plan to stop his Multitak and plan for ablation.  Risks and benefits were discussed include bleeding, tamponade, heart block, stroke, damage surrounding organs.  He understands these risks and is agreed to the procedure.  This patients CHA2DS2-VASc Score and unadjusted Ischemic Stroke Rate (% per year) is equal to 3.2 % stroke rate/year from a score of 3  Above score calculated as 1 point each if present [CHF, HTN, DM, Vascular=MI/PAD/Aortic Plaque, Age if 65-74, or Male] Above score calculated as 2 points each if present [Age > 75, or Stroke/TIA/TE]  2. Hypertension: Currently well controlled    Current medicines are reviewed at  length with the patient today.   The patient does not have concerns regarding his medicines.  The following changes were made today: Stop Multitak  Labs/ tests ordered today include:  Orders Placed This Encounter  Procedures  . CT CARDIAC MORPH/PULM VEIN W/CM&W/O CA SCORE  . CT CORONARY FRACTIONAL FLOW RESERVE DATA PREP  . CT CORONARY FRACTIONAL FLOW RESERVE FLUID ANALYSIS  . Basic metabolic panel  . CBC  . EKG 12-Lead   Case discussed with primary cardiology  Disposition:   FU with Will Camnitz 3 months  Signed, Will Jorja Loa, MD  02/22/2019 9:26 AM     [facility] [address] [place] [address] [phone] (office) [phone] (fax)

## 2019-03-08 ENCOUNTER — Ambulatory Visit: Payer: Medicare Other | Admitting: Cardiology

## 2019-03-10 ENCOUNTER — Telehealth (HOSPITAL_COMMUNITY): Payer: Self-pay | Admitting: Emergency Medicine

## 2019-03-10 ENCOUNTER — Other Ambulatory Visit (HOSPITAL_COMMUNITY): Payer: Self-pay | Admitting: Emergency Medicine

## 2019-03-10 ENCOUNTER — Telehealth: Payer: Self-pay | Admitting: Cardiology

## 2019-03-10 NOTE — Telephone Encounter (Signed)
Reaching out to patient to offer assistance regarding upcoming cardiac imaging study; pt verbalizes understanding of appt date/time, parking situation and where to check in, pre-test NPO status and medications ordered, and verified current allergies; name and call back number provided for further questions should they arise Gregory Bond RN Navigator Cardiac Imaging [facility] [phone] office [phone] cell  Told to check in at admitting at 10:45a for "infusion prior to CT" Medical day appt made for 11a for pre-/post- CTA w/contrast hydration

## 2019-03-10 NOTE — Telephone Encounter (Signed)
Spoke to CT. Marchia Bond in Radiology is calling pt to discuss why this was added to his CT test.

## 2019-03-10 NOTE — Telephone Encounter (Signed)
  Patient is calling because he is questioning what he is supposed to do for the IV appt he has Monday at 11 am. He states this was not discussed when the nurse went over his instructions for his procedure. He was not aware of the IV appt until it popped up on MyChart. Please advise

## 2019-03-14 ENCOUNTER — Ambulatory Visit (HOSPITAL_COMMUNITY)
Admission: RE | Admit: 2019-03-14 | Discharge: 2019-03-14 | Disposition: A | Discharge status: Home / Self Care | Payer: Medicare Other | Source: Ambulatory Visit | Attending: Cardiology | Admitting: Cardiology

## 2019-03-14 ENCOUNTER — Other Ambulatory Visit: Payer: Self-pay

## 2019-03-14 ENCOUNTER — Other Ambulatory Visit: Payer: Medicare Other

## 2019-03-14 DIAGNOSIS — I4819 Other persistent atrial fibrillation: Secondary | ICD-10-CM | POA: Diagnosis not present

## 2019-03-14 LAB — BASIC METABOLIC PANEL
Anion gap: 8 (ref 5–15)
BUN: 30 mg/dL — ABNORMAL HIGH (ref 8–23)
CO2: 23 mmol/L (ref 22–32)
Calcium: 9 mg/dL (ref 8.9–10.3)
Chloride: 108 mmol/L (ref 98–111)
Creatinine, Ser: 1.78 mg/dL — ABNORMAL HIGH (ref 0.61–1.24)
GFR calc Af Amer: 42 mL/min — ABNORMAL LOW (ref >60)
GFR calc non Af Amer: 36 mL/min — ABNORMAL LOW (ref >60)
Glucose, Bld: 96 mg/dL (ref 70–99)
Potassium: 4.3 mmol/L (ref 3.5–5.1)
Sodium: 139 mmol/L (ref 135–145)

## 2019-03-14 IMAGING — CT CT HEART MORPH/PULM VEIN W/ CM & W/O CA SCORE
1 of 10 series · 9 of 20 positions shown, 12 images · non-contrast
Comparison: None.
COMPARISON: None.

Addendum:
EXAM:
OVER-READ INTERPRETATION  CT CHEST

The following report is an over-read performed by radiologist Dr.
Rico Jakob Orehov [[email or web address]] on 03/14/2019. This
over-read does not include interpretation of cardiac or coronary
anatomy or pathology. The coronary calcium score/coronary CTA
interpretation by the cardiologist is attached.
CLINICAL DATA: Atrial fibrillation
Cardiac CTA
MEDICATIONS:
Sub lingual nitro. 4mg x 2
TECHNIQUE: The patient was scanned on a Siemens [REDACTED]ice scanner. Gantry
rotation speed was 250 msecs. Collimation was 0.6 mm. A 100 kV
prospective scan was triggered in the ascending thoracic aorta at
35-75% of the R-R interval. Average HR during the scan was 60 bpm.
The 3D data set was interpreted on a dedicated work station using
MPR, MIP and VRT modes. A total of 80cc of contrast was used.

[Series 17: best diast · axial · 0.46mm/px · z∈[-319,-218]mm · 9 of 3804 slices shown, 12 images]
[im 381/3804  vessel]
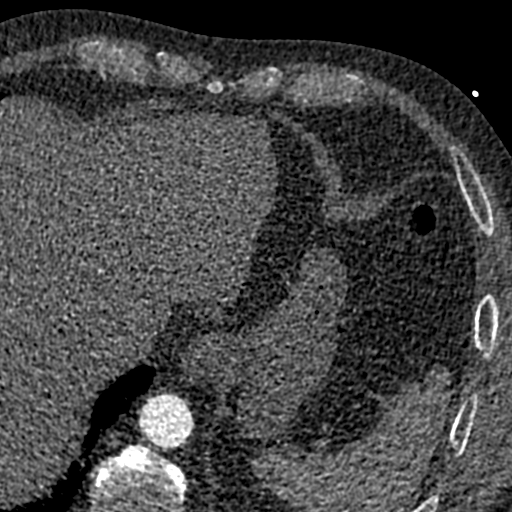
[im 381/3804  lung]
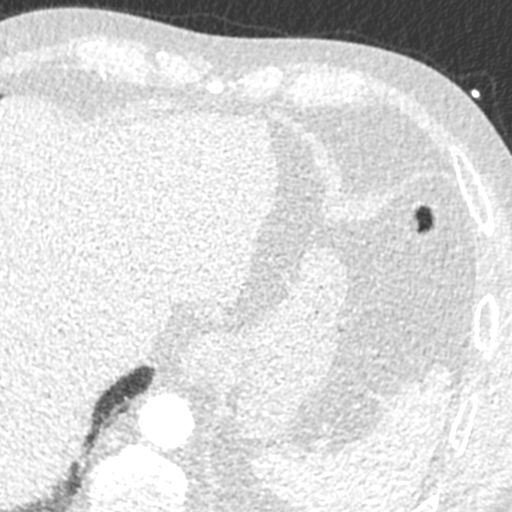
[im 761/3804  vessel]
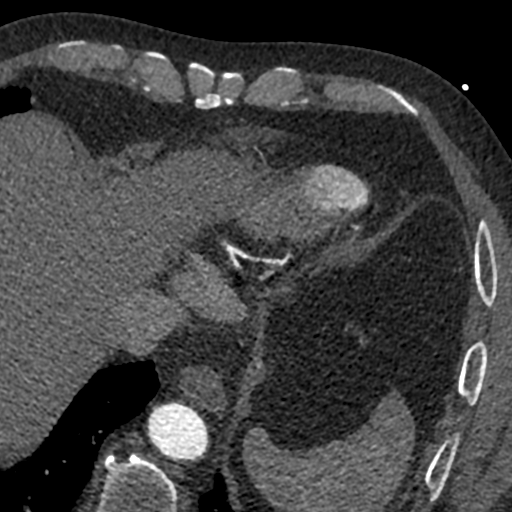
[im 1141/3804  vessel]
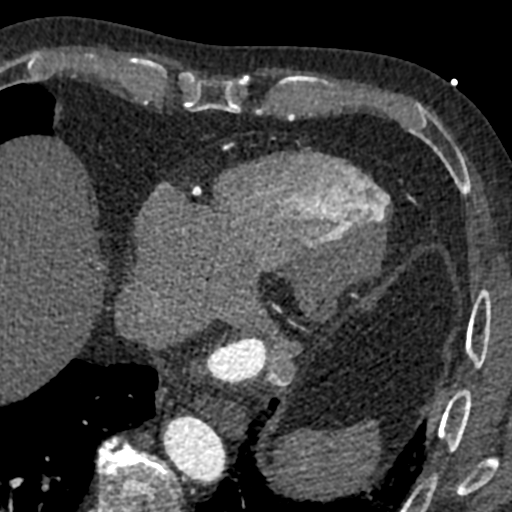
[im 1522/3804  vessel]
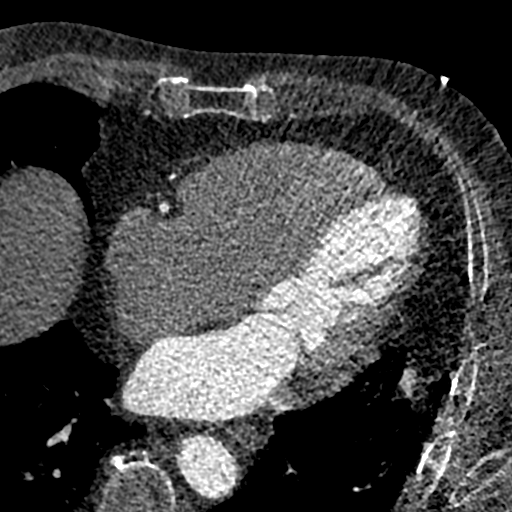
[im 1902/3804  vessel]
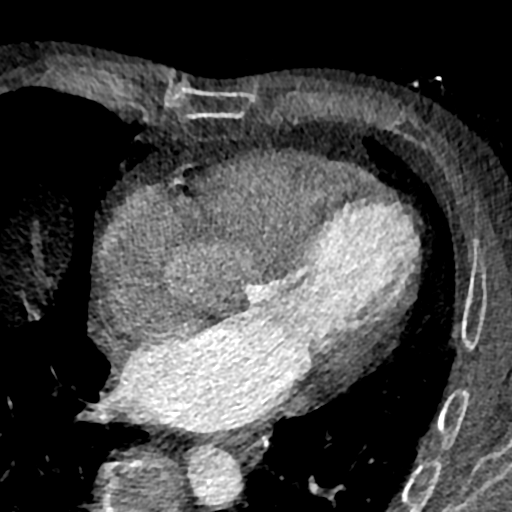
[im 1902/3804  lung]
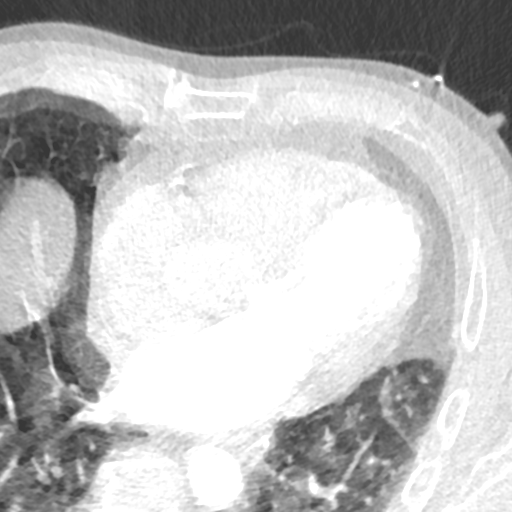
[im 2282/3804  vessel]
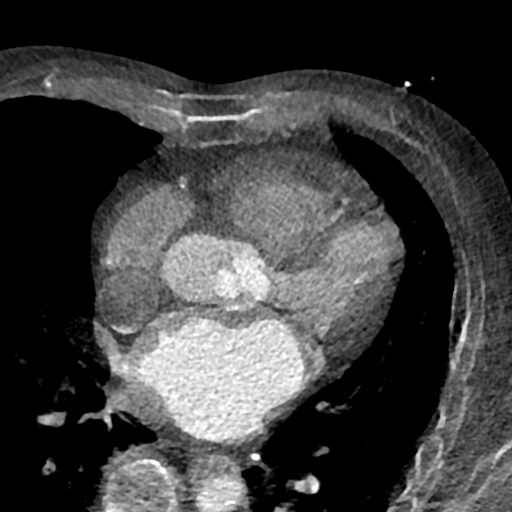
[im 2663/3804  vessel]
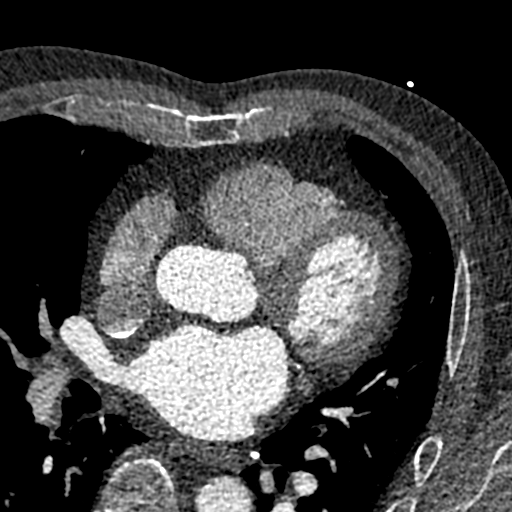
[im 3043/3804  vessel]
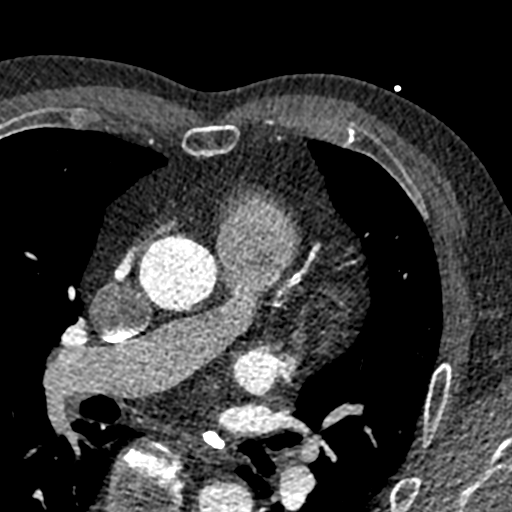
[im 3423/3804  vessel]
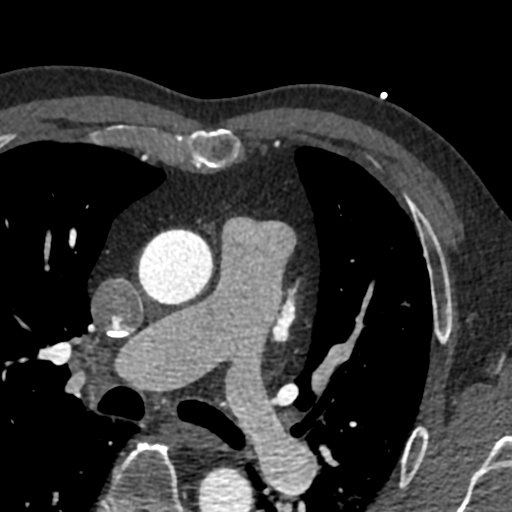
[im 3423/3804  lung]
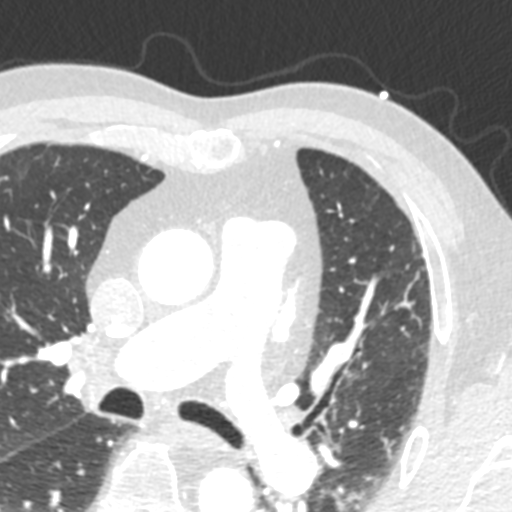

[9 of 20 positions shown; findings below may reference images not displayed]

FINDINGS: Multiple tiny pulmonary nodules measuring 2-4 mm in size in the
lungs. Within the visualized portions of the thorax there are no
larger more suspicious appearing pulmonary nodules or masses, there
is no acute consolidative airspace disease, no pleural effusions, no
pneumothorax and no lymphadenopathy. Visualized portions of the
upper abdomen are unremarkable. There are no aggressive appearing
lytic or blastic lesions noted in the visualized portions of the
skeleton.
IMPRESSION: 1. Multiple tiny 2-4 mm pulmonary nodules in the lungs bilaterally,
nonspecific, but statistically likely benign. No follow-up needed if
patient is low-risk (and has no known or suspected primary
neoplasm). Non-contrast chest CT can be considered in 12 months if
patient is high-risk. This recommendation follows the consensus
statement: Guidelines for Management of Incidental Pulmonary Nodules
Detected on CT Images: From the [HOSPITAL] 1635; Radiology
FINDINGS: Non-cardiac: See separate report from [REDACTED].

Pulmonary veins drain normally to the left atrium. Cannot rule out
small PFO. Severe left atrial enlargement. No LA appendage thrombus
noted.

Pulmonary veins:

LUPV 19 x 13 mm

LLPV 14 x 11.5 mm

RUPV Large, 26 x 24 mm

RLPV 16.5 x 14 mm

Calcium Score: 1458 Agatston units.

Coronary Arteries: Right dominant with no anomalies

LM: Calcified plaque distal left main, mild (<50%) stenosis.

LAD system: Extensive calcified plaque in the proximal and mid LAD.
Mild (<50%) stenosis.

Circumflex system: Small caliber vessel with extensive calcified
plaque proximally. Approximately 50% proximal LCx stenosis.

RCA system: Extensive mixed plaque in the RCA. Mild (<50%) stenosis
in the proximal and mid RCA. Approximately 50% distal RCA stenosis.
IMPRESSION: 1. Coronary artery calcium score 1458 Agatston units. This places
the patient in the 86th percentile for age and gender, suggesting
high risk for future cardiac events.

2. Extensive calcified plaque throughout the coronary system. Mild
stenosis for the most part, possibly around 50% stenosis in the
distal RCA and the proximal LCx (small vessel). However, difficult
to quantify given extensive calcification with possibility of
blooming artifact. Will send for FFR.

3.  Pulmonary veins as noted above.

4.  No LA appendage thrombus seen.

Theophilus Barberena

*** End of Addendum ***
EXAM:
OVER-READ INTERPRETATION  CT CHEST

The following report is an over-read performed by radiologist Dr.
Rico Jakob Orehov [[email or web address]] on 03/14/2019. This
over-read does not include interpretation of cardiac or coronary
anatomy or pathology. The coronary calcium score/coronary CTA
interpretation by the cardiologist is attached.
FINDINGS: Multiple tiny pulmonary nodules measuring 2-4 mm in size in the
lungs. Within the visualized portions of the thorax there are no
larger more suspicious appearing pulmonary nodules or masses, there
is no acute consolidative airspace disease, no pleural effusions, no
pneumothorax and no lymphadenopathy. Visualized portions of the
upper abdomen are unremarkable. There are no aggressive appearing
lytic or blastic lesions noted in the visualized portions of the
skeleton.
IMPRESSION: 1. Multiple tiny 2-4 mm pulmonary nodules in the lungs bilaterally,
nonspecific, but statistically likely benign. No follow-up needed if
patient is low-risk (and has no known or suspected primary
neoplasm). Non-contrast chest CT can be considered in 12 months if
patient is high-risk. This recommendation follows the consensus
statement: Guidelines for Management of Incidental Pulmonary Nodules
Detected on CT Images: From the [HOSPITAL] 1635; Radiology

## 2019-03-14 MED ORDER — METOPROLOL TARTRATE 5 MG/5ML IV SOLN
INTRAVENOUS | Status: AC
  Filled 2019-03-14: qty 15

## 2019-03-14 MED ORDER — IOHEXOL 350 MG/ML SOLN
80.0000 mL | Freq: Once | INTRAVENOUS | Status: AC | PRN
  Administered 2019-03-14: 80 mL via INTRAVENOUS

## 2019-03-14 MED ORDER — SODIUM CHLORIDE 0.9 % WEIGHT BASED INFUSION
3.0000 mL/kg/h | INTRAVENOUS | Status: AC
  Administered 2019-03-14: 11:00:00 3 mL/kg/h via INTRAVENOUS

## 2019-03-14 MED ORDER — SODIUM CHLORIDE 0.9 % WEIGHT BASED INFUSION: 1.0000 mL/kg/h | INTRAVENOUS | Status: DC

## 2019-03-14 MED ORDER — METOPROLOL TARTRATE 5 MG/5ML IV SOLN
5.0000 mg | INTRAVENOUS | Status: DC | PRN
  Administered 2019-03-14 (×3): 5 mg via INTRAVENOUS
  Filled 2019-03-14 (×4): qty 5

## 2019-03-14 NOTE — Progress Notes (Signed)
CT scan completed. Tolerated well. D/C home walking with wife. Awake and alert. In no distress. 

## 2019-03-15 ENCOUNTER — Other Ambulatory Visit (HOSPITAL_COMMUNITY)
Admission: RE | Admit: 2019-03-15 | Discharge: 2019-03-15 | Disposition: A | Discharge status: Home / Self Care | Payer: Medicare Other | Source: Ambulatory Visit | Attending: Cardiology | Admitting: Cardiology

## 2019-03-15 DIAGNOSIS — Z20828 Contact with and (suspected) exposure to other viral communicable diseases: Secondary | ICD-10-CM | POA: Diagnosis not present

## 2019-03-15 DIAGNOSIS — Z01812 Encounter for preprocedural laboratory examination: Secondary | ICD-10-CM | POA: Insufficient documentation

## 2019-03-15 DIAGNOSIS — I4819 Other persistent atrial fibrillation: Secondary | ICD-10-CM | POA: Diagnosis not present

## 2019-03-16 LAB — NOVEL CORONAVIRUS, NAA (HOSP ORDER, SEND-OUT TO REF LAB; TAT 18-24 HRS): SARS-CoV-2, NAA: NOT DETECTED

## 2019-03-18 ENCOUNTER — Ambulatory Visit (HOSPITAL_COMMUNITY): Payer: Medicare Other | Admitting: Anesthesiology

## 2019-03-18 ENCOUNTER — Other Ambulatory Visit: Payer: Self-pay

## 2019-03-18 ENCOUNTER — Ambulatory Visit (HOSPITAL_COMMUNITY)
Admission: RE | Admit: 2019-03-18 | Discharge: 2019-03-18 | Disposition: A | Discharge status: Home / Self Care | Payer: Medicare Other | Attending: Cardiology | Admitting: Cardiology

## 2019-03-18 ENCOUNTER — Encounter (HOSPITAL_COMMUNITY): Payer: Self-pay | Admitting: Certified Registered Nurse Anesthetist

## 2019-03-18 ENCOUNTER — Encounter (HOSPITAL_COMMUNITY)
Admission: RE | Disposition: A | Discharge status: Home / Self Care | Payer: Self-pay | Source: Home / Self Care | Attending: Cardiology

## 2019-03-18 DIAGNOSIS — I4819 Other persistent atrial fibrillation: Secondary | ICD-10-CM

## 2019-03-18 DIAGNOSIS — Z79899 Other long term (current) drug therapy: Secondary | ICD-10-CM | POA: Insufficient documentation

## 2019-03-18 DIAGNOSIS — I129 Hypertensive chronic kidney disease with stage 1 through stage 4 chronic kidney disease, or unspecified chronic kidney disease: Secondary | ICD-10-CM | POA: Insufficient documentation

## 2019-03-18 DIAGNOSIS — G629 Polyneuropathy, unspecified: Secondary | ICD-10-CM | POA: Diagnosis not present

## 2019-03-18 DIAGNOSIS — H9190 Unspecified hearing loss, unspecified ear: Secondary | ICD-10-CM | POA: Diagnosis not present

## 2019-03-18 DIAGNOSIS — M1711 Unilateral primary osteoarthritis, right knee: Secondary | ICD-10-CM | POA: Diagnosis not present

## 2019-03-18 DIAGNOSIS — Z7901 Long term (current) use of anticoagulants: Secondary | ICD-10-CM | POA: Insufficient documentation

## 2019-03-18 DIAGNOSIS — J189 Pneumonia, unspecified organism: Secondary | ICD-10-CM | POA: Diagnosis not present

## 2019-03-18 DIAGNOSIS — N183 Chronic kidney disease, stage 3 unspecified: Secondary | ICD-10-CM | POA: Insufficient documentation

## 2019-03-18 DIAGNOSIS — I480 Paroxysmal atrial fibrillation: Secondary | ICD-10-CM | POA: Diagnosis not present

## 2019-03-18 HISTORY — PX: ATRIAL FIBRILLATION ABLATION: EP1191

## 2019-03-18 LAB — POCT ACTIVATED CLOTTING TIME: Activated Clotting Time: 153 seconds

## 2019-03-18 SURGERY — ATRIAL FIBRILLATION ABLATION
Anesthesia: General

## 2019-03-18 MED ORDER — SUGAMMADEX SODIUM 200 MG/2ML IV SOLN
INTRAVENOUS | Status: DC | PRN
  Administered 2019-03-18: 150 mg via INTRAVENOUS

## 2019-03-18 MED ORDER — SODIUM CHLORIDE 0.9 % IV SOLN
INTRAVENOUS | Status: DC
  Administered 2019-03-18 (×2): via INTRAVENOUS

## 2019-03-18 MED ORDER — ONDANSETRON HCL 4 MG/2ML IJ SOLN
INTRAMUSCULAR | Status: DC | PRN
  Administered 2019-03-18: 4 mg via INTRAVENOUS

## 2019-03-18 MED ORDER — BUPIVACAINE HCL (PF) 0.25 % IJ SOLN
INTRAMUSCULAR | Status: AC
  Filled 2019-03-18: qty 30

## 2019-03-18 MED ORDER — PHENYLEPHRINE HCL (PRESSORS) 10 MG/ML IV SOLN
INTRAVENOUS | Status: DC | PRN
  Administered 2019-03-18: 80 ug via INTRAVENOUS

## 2019-03-18 MED ORDER — HEPARIN (PORCINE) IN NACL 1000-0.9 UT/500ML-% IV SOLN
INTRAVENOUS | Status: AC
  Filled 2019-03-18: qty 2500

## 2019-03-18 MED ORDER — PROPOFOL 10 MG/ML IV BOLUS
INTRAVENOUS | Status: DC | PRN
  Administered 2019-03-18: 130 mg via INTRAVENOUS

## 2019-03-18 MED ORDER — HEPARIN (PORCINE) IN NACL 1000-0.9 UT/500ML-% IV SOLN
INTRAVENOUS | Status: DC | PRN
  Administered 2019-03-18 (×4): 500 mL

## 2019-03-18 MED ORDER — HEPARIN SODIUM (PORCINE) 1000 UNIT/ML IJ SOLN
INTRAMUSCULAR | Status: DC | PRN
  Administered 2019-03-18: 1000 [IU] via INTRAVENOUS

## 2019-03-18 MED ORDER — SODIUM CHLORIDE 0.9 % IV SOLN
INTRAVENOUS | Status: DC | PRN
  Administered 2019-03-18: 08:00:00 25 ug/min via INTRAVENOUS

## 2019-03-18 MED ORDER — HEPARIN SODIUM (PORCINE) 1000 UNIT/ML IJ SOLN
INTRAMUSCULAR | Status: DC | PRN
  Administered 2019-03-18: 2000 [IU] via INTRAVENOUS
  Administered 2019-03-18: 14000 [IU] via INTRAVENOUS
  Administered 2019-03-18: 4000 [IU] via INTRAVENOUS

## 2019-03-18 MED ORDER — SODIUM CHLORIDE 0.9% FLUSH: 3.0000 mL | INTRAVENOUS | Status: DC | PRN

## 2019-03-18 MED ORDER — SODIUM CHLORIDE 0.9 % IV SOLN: 250.0000 mL | INTRAVENOUS | Status: DC | PRN

## 2019-03-18 MED ORDER — DOBUTAMINE IN D5W 4-5 MG/ML-% IV SOLN
INTRAVENOUS | Status: AC
  Filled 2019-03-18: qty 250

## 2019-03-18 MED ORDER — FENTANYL CITRATE (PF) 100 MCG/2ML IJ SOLN
INTRAMUSCULAR | Status: DC | PRN
  Administered 2019-03-18: 50 ug via INTRAVENOUS

## 2019-03-18 MED ORDER — DOBUTAMINE IN D5W 4-5 MG/ML-% IV SOLN
INTRAVENOUS | Status: DC | PRN
  Administered 2019-03-18: 20 ug/kg/min via INTRAVENOUS

## 2019-03-18 MED ORDER — ACETAMINOPHEN 325 MG PO TABS
650.0000 mg | ORAL_TABLET | ORAL | Status: DC | PRN
  Filled 2019-03-18: qty 2

## 2019-03-18 MED ORDER — ONDANSETRON HCL 4 MG/2ML IJ SOLN: 4.0000 mg | Freq: Four times a day (QID) | INTRAMUSCULAR | Status: DC | PRN

## 2019-03-18 MED ORDER — DEXAMETHASONE SODIUM PHOSPHATE 10 MG/ML IJ SOLN
INTRAMUSCULAR | Status: DC | PRN
  Administered 2019-03-18: 4 mg via INTRAVENOUS

## 2019-03-18 MED ORDER — HEPARIN SODIUM (PORCINE) 1000 UNIT/ML IJ SOLN
INTRAMUSCULAR | Status: AC
  Filled 2019-03-18: qty 1

## 2019-03-18 MED ORDER — BUPIVACAINE HCL (PF) 0.25 % IJ SOLN
INTRAMUSCULAR | Status: DC | PRN
  Administered 2019-03-18: 30 mL

## 2019-03-18 MED ORDER — LIDOCAINE 2% (20 MG/ML) 5 ML SYRINGE
INTRAMUSCULAR | Status: DC | PRN
  Administered 2019-03-18: 100 mg via INTRAVENOUS

## 2019-03-18 MED ORDER — SODIUM CHLORIDE 0.9% FLUSH: 3.0000 mL | Freq: Two times a day (BID) | INTRAVENOUS | Status: DC

## 2019-03-18 MED ORDER — PROTAMINE SULFATE 10 MG/ML IV SOLN
INTRAVENOUS | Status: DC | PRN
  Administered 2019-03-18: 50 mg via INTRAVENOUS

## 2019-03-18 MED ORDER — ROCURONIUM BROMIDE 50 MG/5ML IV SOSY
PREFILLED_SYRINGE | INTRAVENOUS | Status: DC | PRN
  Administered 2019-03-18: 50 mg via INTRAVENOUS
  Administered 2019-03-18: 20 mg via INTRAVENOUS

## 2019-03-18 SURGICAL SUPPLY — 21 items
BLANKET WARM UNDERBOD FULL ACC (MISCELLANEOUS) ×3 IMPLANT
CATH MAPPNG PENTARAY F 2-6-2MM (CATHETERS) IMPLANT
CATH SMTCH THERMOCOOL SF DF (CATHETERS) ×2 IMPLANT
CATH SOUNDSTAR 3D IMAGING (CATHETERS) ×2 IMPLANT
CATH WEBSTER BI DIR CS D-F CRV (CATHETERS) ×2 IMPLANT
COVER SWIFTLINK CONNECTOR (BAG) ×3 IMPLANT
PACK EP LATEX FREE (CUSTOM PROCEDURE TRAY) ×3
PACK EP LF (CUSTOM PROCEDURE TRAY) ×1 IMPLANT
PAD PRO RADIOLUCENT 2001M-C (PAD) ×3 IMPLANT
PATCH CARTO3 (PAD) ×2 IMPLANT
PENTARAY F 2-6-2MM (CATHETERS) ×3
SHEATH AVANTI 11F 11CM (SHEATH) ×2 IMPLANT
SHEATH BAYLIS SUREFLEX  M 8.5 (SHEATH) ×2
SHEATH BAYLIS SUREFLEX M 8.5 (SHEATH) IMPLANT
SHEATH BAYLIS TRANSSEPTAL 98CM (NEEDLE) ×2 IMPLANT
SHEATH CARTO VIZIGO SM CVD (SHEATH) ×2 IMPLANT
SHEATH PINNACLE 7F 10CM (SHEATH) ×2 IMPLANT
SHEATH PINNACLE 8F 10CM (SHEATH) ×4 IMPLANT
SHEATH PINNACLE 9F 10CM (SHEATH) ×4 IMPLANT
SHEATH PROBE COVER 6X72 (BAG) ×2 IMPLANT
TUBING SMART ABLATE COOLFLOW (TUBING) ×2 IMPLANT

## 2019-03-18 NOTE — Discharge Instructions (Signed)
Post procedure care instructions No driving for 4 days. No lifting over 5 lbs for 1 week. No vigorous or sexual activity for 1 week. You may return to work on 03/25/2019. Keep procedure site clean & dry. If you notice increased pain, swelling, bleeding or pus, call/return!  You may shower, but no soaking baths/hot tubs/pools for 1 week.   You have an appointment set up with the [facility].  Multiple studies have shown that being followed by a dedicated atrial fibrillation clinic in addition to the standard care you receive from your other physicians improves health. We believe that enrollment in the atrial fibrillation clinic will allow [place] to better care for you.   The phone number to the [facility] is [phone]. The clinic is staffed Monday through Friday from 8:30am to 5pm.  Parking Directions: The clinic is located in the Heart and Vascular Building connected to [facility]. 1)From [address] turn on to [address] and go to the 3rd entrance  (Heart and Vascular entrance) on the right. 2)Look to the right for Heart &Vascular Parking Garage. 3)A code for the entrance is required please call the clinic to receive this.   4)Take the elevators to the 1st floor. Registration is in the room with the glass walls at the end of the hallway.  If you have any trouble parking or locating the clinic, please dont hesitate to call [phone].

## 2019-03-18 NOTE — Anesthesia Preprocedure Evaluation (Signed)
Anesthesia Evaluation  Patient identified by MRN, date of birth, ID band Patient awake    Reviewed: Allergy & Precautions, NPO status , Patient's Chart, lab work & pertinent test results  Airway Mallampati: II  TM Distance: >3 FB     Dental  (+) Dental Advisory Given   Pulmonary neg pulmonary ROS,    breath sounds clear to auscultation       Cardiovascular hypertension, Pt. on medications + dysrhythmias Atrial Fibrillation  Rhythm:Irregular Rate:Normal     Neuro/Psych  Neuromuscular disease    GI/Hepatic negative GI ROS, Neg liver ROS,   Endo/Other  negative endocrine ROS  Renal/GU Renal disease     Musculoskeletal  (+) Arthritis ,   Abdominal   Peds  Hematology negative hematology ROS (+)   Anesthesia Other Findings   Reproductive/Obstetrics                             Anesthesia Physical  Anesthesia Plan  ASA: III  Anesthesia Plan: General   Post-op Pain Management:    Induction: Intravenous  PONV Risk Score and Plan: 2 and Treatment may vary due to age or medical condition  Airway Management Planned: Oral ETT and LMA  Additional Equipment: None  Intra-op Plan:   Post-operative Plan: Extubation in OR  Informed Consent: I have reviewed the patients History and Physical, chart, labs and discussed the procedure including the risks, benefits and alternatives for the proposed anesthesia with the patient or authorized representative who has indicated his/her understanding and acceptance.     Dental advisory given  Plan Discussed with: CRNA  Anesthesia Plan Comments:         Anesthesia Quick Evaluation

## 2019-03-18 NOTE — Transfer of Care (Signed)
Immediate Anesthesia Transfer of Care Note  Patient: Gregory [name]  Procedure(s) Performed: ATRIAL FIBRILLATION ABLATION (N/A )  Patient Location: PACU  Anesthesia Type:General  Level of Consciousness: awake and alert   Airway & Oxygen Therapy: Patient Spontanous Breathing and Patient connected to nasal cannula oxygen  Post-op Assessment: Report given to RN and Post -op Vital signs reviewed and stable  Post vital signs: Reviewed and stable  Last Vitals:  Vitals Value Taken Time  BP 122/73 03/18/19 1029  Temp 36.5 C 03/18/19 1028  Pulse 74 03/18/19 1033  Resp 19 03/18/19 1033  SpO2 99 % 03/18/19 1033  Vitals shown include unvalidated device data.  Last Pain:  Vitals:   03/18/19 1028  TempSrc: Temporal  PainSc:       Patients Stated Pain Goal: 3 (56/81/27 5170)  Complications: No apparent anesthesia complications

## 2019-03-18 NOTE — Progress Notes (Signed)
Site area: right groin left groin  Site Prior to Removal:  Level 0  Pressure Applied For 30 MINUTES    Minutes Beginning at 1120  Manual:   Yes   Patient Status During Pull:  Stable  Post Pull Groin Site:  Level 0  Post Pull Instructions Given:  Yes.    Post Pull Pulses Present:  Yes.    Dressing Applied:  Yes.    Comments:  Bed rest for 6 hr.

## 2019-03-18 NOTE — Anesthesia Postprocedure Evaluation (Signed)
Anesthesia Post Note  Patient: Gregory [name]  Procedure(s) Performed: ATRIAL FIBRILLATION ABLATION (N/A )     Patient location during evaluation: PACU Anesthesia Type: General Level of consciousness: sedated and patient cooperative Pain management: pain level controlled Vital Signs Assessment: post-procedure vital signs reviewed and stable Respiratory status: spontaneous breathing Cardiovascular status: stable Anesthetic complications: no    Last Vitals:  Vitals:   03/18/19 1033 03/18/19 1101  BP: 111/72 117/79  Pulse: 73 71  Resp:    Temp: (!) 36.2 C (!) 36.3 C  SpO2:      Last Pain:  Vitals:   03/18/19 1101  TempSrc: Temporal  PainSc: 0-No pain                 Nolon Nations

## 2019-03-18 NOTE — Anesthesia Procedure Notes (Signed)
Procedure Name: Intubation Date/Time: 03/18/2019 7:48 AM Performed by: Inda Coke, CRNA Pre-anesthesia Checklist: Patient identified, Emergency Drugs available, Suction available and Patient being monitored Patient Re-evaluated:Patient Re-evaluated prior to induction Oxygen Delivery Method: Circle System Utilized Preoxygenation: Pre-oxygenation with 100% oxygen Induction Type: IV induction Ventilation: Mask ventilation without difficulty Laryngoscope Size: Mac and 3 Grade View: Grade I Tube type: Oral Tube size: 7.5 mm Number of attempts: 1 Airway Equipment and Method: Stylet and Oral airway Placement Confirmation: ETT inserted through vocal cords under direct vision,  positive ETCO2 and breath sounds checked- equal and bilateral Secured at: 22 cm Tube secured with: Tape Dental Injury: Teeth and Oropharynx as per pre-operative assessment

## 2019-03-18 NOTE — H&P (Signed)
Gregory [name] has presented today for surgery, with the diagnosis of atrial fibrillation.  The various methods of treatment have been discussed with the patient and family. After consideration of risks, benefits and other options for treatment, the patient has consented to  Procedure(s): Catheter ablation as a surgical intervention .  Risks include but not limited to bleeding, tamponade, heart block, stroke, damage to surrounding organs, among others. The patient's history has been reviewed, patient examined, no change in status, stable for surgery.  I have reviewed the patient's chart and labs.  Questions were answered to the patient's satisfaction.    [name] [name], MD 03/18/2019 7:09 AM

## 2019-03-21 ENCOUNTER — Encounter (HOSPITAL_COMMUNITY): Payer: Self-pay | Admitting: Cardiology

## 2019-03-21 MED FILL — Heparin Sod (Porcine)-NaCl IV Soln 1000 Unit/500ML-0.9%: INTRAVENOUS | Qty: 500 | Status: AC

## 2019-03-22 ENCOUNTER — Encounter (HOSPITAL_COMMUNITY): Payer: Self-pay | Admitting: Cardiology

## 2019-03-22 LAB — POCT ACTIVATED CLOTTING TIME
Activated Clotting Time: 274 seconds
Activated Clotting Time: 312 seconds

## 2019-03-22 NOTE — Telephone Encounter (Signed)
From pt

## 2019-03-28 DIAGNOSIS — Z23 Encounter for immunization: Secondary | ICD-10-CM | POA: Diagnosis not present

## 2019-03-31 ENCOUNTER — Other Ambulatory Visit: Payer: Self-pay

## 2019-03-31 ENCOUNTER — Encounter: Payer: Self-pay | Admitting: Cardiology

## 2019-03-31 ENCOUNTER — Ambulatory Visit (INDEPENDENT_AMBULATORY_CARE_PROVIDER_SITE_OTHER): Payer: Medicare Other | Admitting: Cardiology

## 2019-03-31 VITALS — BP 137/83 | HR 66 | Temp 97.3°F | Ht 70.5 in | Wt 189.9 lb

## 2019-03-31 DIAGNOSIS — Z8679 Personal history of other diseases of the circulatory system: Secondary | ICD-10-CM | POA: Diagnosis not present

## 2019-03-31 DIAGNOSIS — Z9889 Other specified postprocedural states: Secondary | ICD-10-CM | POA: Diagnosis not present

## 2019-03-31 DIAGNOSIS — I480 Paroxysmal atrial fibrillation: Secondary | ICD-10-CM

## 2019-03-31 DIAGNOSIS — E7800 Pure hypercholesterolemia, unspecified: Secondary | ICD-10-CM | POA: Diagnosis not present

## 2019-03-31 DIAGNOSIS — N1832 Chronic kidney disease, stage 3b: Secondary | ICD-10-CM

## 2019-03-31 DIAGNOSIS — I2510 Atherosclerotic heart disease of native coronary artery without angina pectoris: Secondary | ICD-10-CM

## 2019-03-31 MED ORDER — LISINOPRIL 5 MG PO TABS: 5.0000 mg | ORAL_TABLET | Freq: Every day | ORAL | 3 refills | Status: DC

## 2019-03-31 MED ORDER — ROSUVASTATIN CALCIUM 5 MG PO TABS: 5.0000 mg | ORAL_TABLET | Freq: Every day | ORAL | 3 refills | Status: DC

## 2019-03-31 NOTE — Progress Notes (Signed)
Primary Physician/Referring:  Christain Sacramento, MD  Patient ID: Gregory [name], male    DOB: [birth year], 78 y.o.   MRN: [id number]  Subjective   Chief Complaint  Patient presents with  . Paroxysmal atrial fibrillation  . Follow-up    3 month    [name]  is a 78 y.o. male   Caucasian male with history of paroxysmal atrial fibrillation. He has history of multiple cardioversions and eventually dofetilide was discontinued by Dr. [name] due to efficacy, and was on Multaq and has maintained sinus since Dec 2018. Due to recurrence of atrial fibrillation, he underwent 2nd ablation on 02/22/2019, 1st ablation being on 11/28/2016.  In Aug 2020, due to recurrence of A. Fib was placed on amiodarone and had cardioversion but did not sustain sinus. Due to recurrence of atrial fibrillation, he underwent 2nd ablation on 02/22/2019, 1st ablation being on 11/28/2016.  He is tolerating anticoagulation with Eliquis, No GI bleed, no dark stools. His other history includes stage IIIb chronic kidney disease. He is presently doing well and states that he is maintaining sinus rhythm.  He is wondering whether he should be back on lisinopril for blood pressure and renal function and also wondering whether he should go back on Multaq.   Past Medical History:  Diagnosis Date  . Arthritis    "right knee" (01/28/2016)  . Diverticulosis of colon (without mention of hemorrhage)   . Dysrhythmia   . Internal hemorrhoids without mention of complication   . Personal history of colonic polyps 01/17/2002   adenomatous, tublar adenoma 02/10/2007  . PNA (pneumonia) 03/31/2012    Past Surgical History:  Procedure Laterality Date  . ATRIAL FIBRILLATION ABLATION  11/28/2016  . ATRIAL FIBRILLATION ABLATION N/A 11/28/2016   Procedure: Atrial Fibrillation Ablation;  Surgeon: Constance Haw, MD;  Location: [place] [facility];  Service: Cardiovascular;  Laterality: N/A;  . ATRIAL FIBRILLATION ABLATION N/A  03/18/2019   Procedure: ATRIAL FIBRILLATION ABLATION;  Surgeon: Constance Haw, MD;  Location: [place] [facility];  Service: Cardiovascular;  Laterality: N/A;  . CARDIOVERSION  05/03/2012   Procedure: CARDIOVERSION;  Surgeon: Laverda Page, MD;  Location: [facility] ENDOSCOPY;  Service: Cardiovascular;  Laterality: N/A;  . CARDIOVERSION  06/08/2012   Procedure: CARDIOVERSION;  Surgeon: Laverda Page, MD;  Location: [place];  Service: Cardiovascular;  Laterality: N/A;  [name]  . CARDIOVERSION N/A 10/28/2013   Procedure: CARDIOVERSION;  Surgeon: Laverda Page, MD;  Location: [place];  Service: Cardiovascular;  Laterality: N/A;  . CARDIOVERSION N/A 06/30/2014   Procedure: CARDIOVERSION;  Surgeon: Laverda Page, MD;  Location: [place];  Service: Cardiovascular;  Laterality: N/A;  H&P in file  . CARDIOVERSION N/A 09/05/2014   Procedure: CARDIOVERSION;  Surgeon: Adrian Prows, MD;  Location: [place];  Service: Cardiovascular;  Laterality: N/A;  . CARDIOVERSION N/A 12/24/2015   Procedure: CARDIOVERSION;  Surgeon: Adrian Prows, MD;  Location: [place];  Service: Cardiovascular;  Laterality: N/A;  . CARDIOVERSION N/A 01/30/2016   Procedure: CARDIOVERSION;  Surgeon: Adrian Prows, MD;  Location: [facility] ENDOSCOPY;  Service: Cardiovascular;  Laterality: N/A;  . CARDIOVERSION N/A 02/08/2016   Procedure: CARDIOVERSION;  Surgeon: Adrian Prows, MD;  Location: [facility] ENDOSCOPY;  Service: Cardiovascular;  Laterality: N/A;  . CARDIOVERSION N/A 02/08/2019   Procedure: CARDIOVERSION;  Surgeon: Adrian Prows, MD;  Location: [place];  Service: Cardiovascular;  Laterality: N/A;  . CATARACT EXTRACTION W/ INTRAOCULAR LENS  IMPLANT, BILATERAL Bilateral   . COLONOSCOPY    .  INGUINAL HERNIA REPAIR Bilateral    as child  . KNEE ARTHROSCOPY Right 2004    Social History   Socioeconomic History  . Marital status: Married    Spouse name: Not on file  . Number of children: 2  . Years of education: Not  on file  . Highest education level: Not on file  Occupational History    Employer: [employer]  . Financial resource strain: Not on file  . Food insecurity    Worry: Not on file    Inability: Not on file  . Transportation needs    Medical: Not on file    Non-medical: Not on file  Tobacco Use  . Smoking status: Never Smoker  . Smokeless tobacco: Never Used  Substance and Sexual Activity  . Alcohol use: Yes    Alcohol/week: 7.0 standard drinks    Types: 7 Glasses of wine per week  . Drug use: No  . Sexual activity: Not Currently  Lifestyle  . Physical activity    Days per week: Not on file    Minutes per session: Not on file  . Stress: Not on file  Relationships  . Social Herbalist on phone: Not on file    Gets together: Not on file    Attends religious service: Not on file    Active member of club or organization: Not on file    Attends meetings of clubs or organizations: Not on file    Relationship status: Not on file  . Intimate partner violence    Fear of current or ex partner: Not on file    Emotionally abused: Not on file    Physically abused: Not on file    Forced sexual activity: Not on file  Other Topics Concern  . Not on file  Social History Narrative  . Not on file   Review of Systems  Constitution: Positive for malaise/fatigue (mild). Negative for chills, decreased appetite and weight gain.  Cardiovascular: Negative for dyspnea on exertion, leg swelling and syncope.  Respiratory: Positive for shortness of breath (mild).   Endocrine: Negative for cold intolerance.  Hematologic/Lymphatic: Does not bruise/bleed easily.  Musculoskeletal: Negative for joint swelling.  Gastrointestinal: Negative for abdominal pain, anorexia, change in bowel habit, hematochezia and melena.  Neurological: Negative for headaches and light-headedness.  Psychiatric/Behavioral: Negative for depression and substance abuse.  All other systems reviewed and are  negative.  Objective   Vitals with BMI 03/31/2019 03/18/2019 03/18/2019  Height 5' 10.5" - -  Weight 189 lbs 14 oz - -  BMI 57.01 - -  Systolic 779 390 300  Diastolic 83 65 69  Pulse 66 76 80     Physical Exam  Constitutional: He appears well-developed and well-nourished. No distress.  HENT:  Head: Atraumatic.  Eyes: Conjunctivae are normal.  Neck: Neck supple. No JVD present. No thyromegaly present.  Cardiovascular: Normal rate, regular rhythm, S1 normal, S2 normal, intact distal pulses and normal pulses. Exam reveals no gallop.  Murmur heard.  Midsystolic murmur is present with a grade of 2/6 at the apex.  Early diastolic murmur is present with a grade of 2/6 at the upper left sternal border. No carotid bruit, no leg edema.   Pulmonary/Chest: Effort normal and breath sounds normal.  Abdominal: Soft. Bowel sounds are normal.  Musculoskeletal: Normal range of motion.        General: No edema.  Neurological: He is alert.  Skin: Skin is warm and dry.  Psychiatric: He has a normal mood and affect.   Radiology: No results found.  Laboratory examination:   06/18/2018: TSH 1.69.  Normal H and H, MCV 98, MCH 33, CBC otherwise normal.  Creatinine 1.82, EGFR 35/41, potassium 5.2, CMP otherwise normal.  09/28/2017: Creatinine 1.84, EGFR 35/41, potassium 5.6, CMP otherwise normal.  Normal H&H, MCV 99, CBC otherwise normal.  05/23/2015: Total cholesterol 208, triglycerides 113, HDL 53, LDL 132, creatinine 1.53, eGFR 44, potassium 4.6, CMP otherwise normal, CBC normal, PSA elevated at 5.3  CMP Latest Ref Rng & Units 03/14/2019 02/22/2019 02/03/2019  Glucose 70 - 99 mg/dL 96 83 128(H)  BUN 8 - 23 mg/dL 30(H) 25 24  Creatinine 0.61 - 1.24 mg/dL 1.78(H) 1.60(H) 1.73(H)  Sodium 135 - 145 mmol/L 139 138 141  Potassium 3.5 - 5.1 mmol/L 4.3 4.9 4.2  Chloride 98 - 111 mmol/L 108 104 105  CO2 22 - 32 mmol/L 23 22 20   Calcium 8.9 - 10.3 mg/dL 9.0 9.5 8.9  Total Protein 6.0 - 8.5 g/dL - - -  Total  Bilirubin 0.0 - 1.2 mg/dL - - -  Alkaline Phos 39 - 117 IU/L - - -  AST 0 - 40 IU/L - - -  ALT 0 - 44 IU/L - - -   CBC Latest Ref Rng & Units 02/22/2019 12/29/2018 11/19/2016  WBC 3.4 - 10.8 x10E3/uL 4.9 5.1 5.3  Hemoglobin 13.0 - 17.7 g/dL 15.0 14.6 14.5  Hematocrit 37.5 - 51.0 % 45.5 42.9 43.3  Platelets 150 - 450 x10E3/uL 219 191 204   Lipid Panel     Component Value Date/Time   CHOL 169 12/29/2018 0816   TRIG 83 12/29/2018 0816   HDL 49 12/29/2018 0816   LDLCALC 103 (H) 12/29/2018 0816   HEMOGLOBIN A1C No results found for: HGBA1C, MPG TSH No results for input(s): TSH in the last 8760 hours.   Medications   Current Outpatient Medications  Medication Instructions  . acetaminophen (TYLENOL) 1,000 mg, Oral, Every 6 hours PRN  . apixaban (ELIQUIS) 5 mg, Oral, 2 times daily  . lisinopril (ZESTRIL) 5 mg, Oral, Daily  . psyllium (METAMUCIL) 58.6 % packet 1 packet, Oral, Daily PRN  . rosuvastatin (CRESTOR) 5 mg, Oral, Daily    Cardiac Studies:   Echocardiogram [02/26/2016]: 1. Left ventricle cavity is normal in size. Mild concentric hypertrophy of the left ventricle. Normal global wall motion. Calculated EF 68%. 2. Left atrial cavity is moderately dilated. 3. Mild to moderate mitral regurgitation. 4. Mild tricuspid regurgitation. No evidence of pulmonary hypertension. 5. Mild to moderate pulmonic regurgitation. 6. c.f. echo. of 07/19/2013, RV, RA sizes appear normal, TR is less severe, and, there is no pulmonary hypertension. MR appears slightly less severe.  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 Trifascicular block. Normal BP response. There was no ST-T changes of ischemia with exercise stress test. No stress induced arrhythmias. Stress terminated due to THR (>85% MPHR)/MPHR met. The patient exercised according to Bruce Protocol, Total time recorded 06:50 min achieving max heart rate of 158  which was 106% of MPHR for age and 8.34 METS of work.  Coronary CTA 03/06/2019: 1. Coronary artery calcium score 1837 Agatston units. This places the patient in the 86th percentile for age and gender, suggesting high risk for future cardiac events.   2. Extensive calcified plaque throughout the coronary system. Mild stenosis for the most part, possibly around 50% stenosis in the distal  RCA and the proximal LCx (small vessel). However, difficult to quantify given extensive calcification with possibility of blooming artifact. Will send for FFR.   3.  Pulmonary veins without stenosis.    4.  No LA appendage thrombus seen.  FFR 0.9 mid LAD   FFR 0.93 distal RCA   FFR 0.94 mid LCx   IMPRESSION: No evidence for hemodynamically significant stenosis.  Assessment     ICD-10-CM   1. Paroxysmal atrial fibrillation (HCC)  I48.0 EKG 12-Lead   CHA2DS2-VASc Score is 3.  Yearly risk of stroke: 3.2%.( CAD;= 1; Age  >75 =2)  2. Stage 3b chronic kidney disease  U43.83 Basic metabolic panel    Basic metabolic panel    lisinopril (ZESTRIL) 5 MG tablet  3. S/P ablation of atrial fibrillation  Z98.890    Z86.79   4. Coronary artery disease involving native coronary artery of native heart without angina pectoris  I25.10 rosuvastatin (CRESTOR) 5 MG tablet   By Cardiac CTA 03/14/19  5. Mild hypercholesterolemia  E78.00 rosuvastatin (CRESTOR) 5 MG tablet    EKG 03/31/2019: Sinus rhythm with first-degree AV block at the rate of 60 bpm, normal axis.  No evidence of ischemia.  Low-voltage complexes.  Normal QT interval.  EKG 01/31/2019: Atrial fibrillation with controlled ventricular response at the rate of 86 bpm, left axis deviation, left anterior fascicular block.  Poor R-wave progression, cannot exclude anteroseptal infarct old.  Pulmonary disease pattern with low voltage, no evidence of ischemia.  EKG 06/28/2018: Sinus rhythm with first-degree AV block at the rate of 58 bpm, normal axis, normal QT  interval. Low-voltage complexes. No significant change from EKG 12/21/2017. Recommendation:    Patient presents for post atrial fibrillation follow-up, underwent 2nd ablation on 02/22/2019.  He is maintaining sinus rhythm and is wondering whether he should be back on Multac.  We will request Dr. [name] to comment on this.  He does have stage IIIB chronic kidney disease, he'll benefit on being back on lisinopril which was held after his ablation and also prior to this due to contrast administration for CT scanning of the cardiac structures.  I'll obtain a BMP as a baseline for today and also repeat that in 2 weeks.  I reviewed the CT angiograms, she does have mild to moderate coronary artery disease which is expected for his age.  He has very mild hyperlipidemia.  I will start him on a small dose of statin with Crestor 5 mg daily.  I'll see him back in 3 months for follow-up.  Adrian Prows, MD, [facility] 03/31/2019, 9:33 AM [facility]. [place] Pager: [phone] Office: [phone] If no answer Cell [phone]

## 2019-04-01 DIAGNOSIS — N1832 Chronic kidney disease, stage 3b: Secondary | ICD-10-CM | POA: Diagnosis not present

## 2019-04-02 LAB — BASIC METABOLIC PANEL
BUN/Creatinine Ratio: 17 (ref 10–24)
BUN: 25 mg/dL (ref 8–27)
CO2: 24 mmol/L (ref 20–29)
Calcium: 9.4 mg/dL (ref 8.6–10.2)
Chloride: 106 mmol/L (ref 96–106)
Creatinine, Ser: 1.48 mg/dL — ABNORMAL HIGH (ref 0.76–1.27)
GFR calc Af Amer: 52 mL/min/{1.73_m2} — ABNORMAL LOW (ref >59)
GFR calc non Af Amer: 45 mL/min/{1.73_m2} — ABNORMAL LOW (ref >59)
Glucose: 92 mg/dL (ref 65–99)
Potassium: 4.4 mmol/L (ref 3.5–5.2)
Sodium: 144 mmol/L (ref 134–144)

## 2019-04-06 ENCOUNTER — Other Ambulatory Visit: Payer: Self-pay

## 2019-04-06 ENCOUNTER — Encounter (HOSPITAL_COMMUNITY): Payer: Self-pay

## 2019-04-06 MED ORDER — APIXABAN 5 MG PO TABS: 5.0000 mg | ORAL_TABLET | Freq: Two times a day (BID) | ORAL | 2 refills | Status: DC

## 2019-04-06 MED ORDER — APIXABAN 5 MG PO TABS: 5.0000 mg | ORAL_TABLET | Freq: Two times a day (BID) | ORAL | 3 refills | Status: DC

## 2019-04-12 DIAGNOSIS — M1711 Unilateral primary osteoarthritis, right knee: Secondary | ICD-10-CM | POA: Diagnosis not present

## 2019-04-13 NOTE — Telephone Encounter (Signed)
From pt

## 2019-04-19 DIAGNOSIS — H5213 Myopia, bilateral: Secondary | ICD-10-CM | POA: Diagnosis not present

## 2019-04-19 DIAGNOSIS — G43109 Migraine with aura, not intractable, without status migrainosus: Secondary | ICD-10-CM | POA: Diagnosis not present

## 2019-04-19 DIAGNOSIS — Z961 Presence of intraocular lens: Secondary | ICD-10-CM | POA: Diagnosis not present

## 2019-04-21 ENCOUNTER — Ambulatory Visit (HOSPITAL_COMMUNITY): Payer: Medicare Other | Admitting: Nurse Practitioner

## 2019-05-06 ENCOUNTER — Other Ambulatory Visit: Payer: Self-pay | Admitting: Cardiology

## 2019-05-06 ENCOUNTER — Other Ambulatory Visit: Payer: Self-pay

## 2019-05-06 DIAGNOSIS — N1832 Chronic kidney disease, stage 3b: Secondary | ICD-10-CM

## 2019-05-06 MED ORDER — LISINOPRIL 5 MG PO TABS: 5.0000 mg | ORAL_TABLET | Freq: Every day | ORAL | 3 refills | Status: DC

## 2019-05-11 ENCOUNTER — Other Ambulatory Visit: Payer: Self-pay

## 2019-05-11 DIAGNOSIS — N1832 Chronic kidney disease, stage 3b: Secondary | ICD-10-CM

## 2019-05-11 MED ORDER — LISINOPRIL 5 MG PO TABS: 5.0000 mg | ORAL_TABLET | Freq: Every day | ORAL | 3 refills | Status: DC

## 2019-05-16 ENCOUNTER — Telehealth: Payer: Self-pay | Admitting: Cardiology

## 2019-05-16 NOTE — Telephone Encounter (Signed)
From pt

## 2019-05-16 NOTE — Telephone Encounter (Signed)
Camnitz, Will Hassell Done, MD  You 1 hour ago (1:18 PM)   Patient needs to go to AF clinic to discuss return of AF.   Message text

## 2019-05-16 NOTE — Telephone Encounter (Signed)
appt made for 12/1

## 2019-05-16 NOTE — Telephone Encounter (Signed)
New Message     Patient c/o Palpitations:  High priority if patient c/o lightheadedness, shortness of breath, or chest pain  1) How long have you had palpitations/irregular HR/ Afib? Are you having the symptoms now? Pt says he has been in AFIB since Thanksgiving   2) Are you currently experiencing lightheadedness, SOB or CP? Not really, He says he gets a little winded doing normal things.   3) Do you have a history of afib (atrial fibrillation) or irregular heart rhythm? Yes   4) Have you checked your BP or HR? (document readings if available): Has not checked   5) Are you experiencing any other symptoms? Pt says he went back into AFIB on Thanksgiving

## 2019-05-16 NOTE — Telephone Encounter (Signed)
Pt made aware Dr. [name] recommends AF clinic to further discuss return of AF. Pt aware I will forward this message to their clinic and ask them to call and follow up with him. Pt will await their call.

## 2019-05-17 ENCOUNTER — Other Ambulatory Visit: Payer: Self-pay

## 2019-05-17 ENCOUNTER — Other Ambulatory Visit (HOSPITAL_COMMUNITY): Payer: Self-pay

## 2019-05-17 ENCOUNTER — Encounter (HOSPITAL_COMMUNITY): Payer: Self-pay | Admitting: Physician Assistant

## 2019-05-17 ENCOUNTER — Other Ambulatory Visit (HOSPITAL_COMMUNITY): Payer: Self-pay | Admitting: *Deleted

## 2019-05-17 ENCOUNTER — Ambulatory Visit (HOSPITAL_COMMUNITY)
Admission: RE | Admit: 2019-05-17 | Discharge: 2019-05-17 | Disposition: A | Discharge status: Home / Self Care | Payer: Medicare Other | Source: Ambulatory Visit | Attending: Physician Assistant | Admitting: Physician Assistant

## 2019-05-17 VITALS — BP 120/80 | HR 140 | Ht 70.5 in | Wt 194.0 lb

## 2019-05-17 DIAGNOSIS — I10 Essential (primary) hypertension: Secondary | ICD-10-CM | POA: Insufficient documentation

## 2019-05-17 DIAGNOSIS — I4892 Unspecified atrial flutter: Secondary | ICD-10-CM | POA: Insufficient documentation

## 2019-05-17 DIAGNOSIS — R9431 Abnormal electrocardiogram [ECG] [EKG]: Secondary | ICD-10-CM | POA: Insufficient documentation

## 2019-05-17 DIAGNOSIS — Z79899 Other long term (current) drug therapy: Secondary | ICD-10-CM | POA: Diagnosis not present

## 2019-05-17 DIAGNOSIS — D6869 Other thrombophilia: Secondary | ICD-10-CM

## 2019-05-17 DIAGNOSIS — I2510 Atherosclerotic heart disease of native coronary artery without angina pectoris: Secondary | ICD-10-CM | POA: Diagnosis not present

## 2019-05-17 DIAGNOSIS — I484 Atypical atrial flutter: Secondary | ICD-10-CM | POA: Diagnosis not present

## 2019-05-17 DIAGNOSIS — K5730 Diverticulosis of large intestine without perforation or abscess without bleeding: Secondary | ICD-10-CM | POA: Insufficient documentation

## 2019-05-17 DIAGNOSIS — I471 Supraventricular tachycardia: Secondary | ICD-10-CM | POA: Diagnosis not present

## 2019-05-17 DIAGNOSIS — Z7901 Long term (current) use of anticoagulants: Secondary | ICD-10-CM | POA: Diagnosis not present

## 2019-05-17 DIAGNOSIS — I4819 Other persistent atrial fibrillation: Secondary | ICD-10-CM | POA: Insufficient documentation

## 2019-05-17 HISTORY — DX: Atypical atrial flutter: I48.4

## 2019-05-17 LAB — BASIC METABOLIC PANEL
Anion gap: 9 (ref 5–15)
BUN: 22 mg/dL (ref 8–23)
CO2: 21 mmol/L — ABNORMAL LOW (ref 22–32)
Calcium: 9.2 mg/dL (ref 8.9–10.3)
Chloride: 109 mmol/L (ref 98–111)
Creatinine, Ser: 1.69 mg/dL — ABNORMAL HIGH (ref 0.61–1.24)
GFR calc Af Amer: 44 mL/min — ABNORMAL LOW (ref >60)
GFR calc non Af Amer: 38 mL/min — ABNORMAL LOW (ref >60)
Glucose, Bld: 147 mg/dL — ABNORMAL HIGH (ref 70–99)
Potassium: 4.3 mmol/L (ref 3.5–5.1)
Sodium: 139 mmol/L (ref 135–145)

## 2019-05-17 LAB — CBC
HCT: 44.8 % (ref 39.0–52.0)
Hemoglobin: 14.9 g/dL (ref 13.0–17.0)
MCH: 32.9 pg (ref 26.0–34.0)
MCHC: 33.3 g/dL (ref 30.0–36.0)
MCV: 98.9 fL (ref 80.0–100.0)
Platelets: 183 10*3/uL (ref 150–400)
RBC: 4.53 MIL/uL (ref 4.22–5.81)
RDW: 13.2 % (ref 11.5–15.5)
WBC: 4.6 10*3/uL (ref 4.0–10.5)
nRBC: 0 % (ref 0.0–0.2)

## 2019-05-17 MED ORDER — MULTAQ 400 MG PO TABS: 400.0000 mg | ORAL_TABLET | Freq: Two times a day (BID) | ORAL | Status: DC

## 2019-05-17 MED ORDER — DILTIAZEM HCL 30 MG PO TABS: ORAL_TABLET | ORAL | 2 refills | Status: DC

## 2019-05-17 NOTE — H&P (View-Only) (Signed)
Primary Care Physician: [name], MD Primary Cardiologist: Dr Jacinto Halim Primary Electrophysiologist: Dr Elberta Fortis Referring Physician: Dr Ellyn Hack is a 78 y.o. male with a history of HTN, CAD, persistent atrial fibrillation who presents for follow up in the [facility]. He has had multiple cardioversions and has since been put on dofetilide. He was having breakthrough episodes of atrial fibrillation and had ablation on 11/28/16.  At his last visit, he presented to clinic in atrial fibrillation.  Dofetilide was stopped and he was started on amiodarone. He was then switch to [facility] 02/2018. He is on Eliquis for a CHADS2VASC score of 4.   On follow up today, patient is s/p repeat afib ablation with Dr Elberta Fortis 03/18/19. He reports that he felt well since his procedure with no symptoms until 05/12/19 when he began feeling more winded, palpitations, and an irregular pulse. There were no specific triggers that the patient could identify. His symptoms have been constant. His pulse oximeter has shown heart rates 60-90s.   Today, he denies symptoms of chest pain, orthopnea, PND, lower extremity edema, dizziness, presyncope, syncope, snoring, daytime somnolence, bleeding, or neurologic sequela. The patient is tolerating medications without difficulties and is otherwise without complaint today.    Atrial Fibrillation Risk Factors:  he does not have symptoms or diagnosis of sleep apnea. he does not have a history of rheumatic fever. he does not have a history of alcohol use.   he has a BMI of Body mass index is 27.44 kg/m.Marland Kitchen Filed Weights   05/17/19 0934  Weight: 88 kg    Family History  Problem Relation Age of Onset  . Heart disease Father   . Stroke Father   . Cancer Mother        bladder  . Colon cancer Neg Hx   . Stomach cancer Neg Hx      Atrial Fibrillation Management history:  Previous antiarrhythmic drugs: dofetilide, amiodarone,  Multaq Previous cardioversions: several, most recently 02/08/19 Previous ablations: 11/2016, 03/18/19 CHADS2VASC score: 4 Anticoagulation history: Eliquis   Past Medical History:  Diagnosis Date  . Arthritis    "right knee" (01/28/2016)  . Diverticulosis of colon (without mention of hemorrhage)   . Dysrhythmia   . Internal hemorrhoids without mention of complication   . Personal history of colonic polyps 01/17/2002   adenomatous, tublar adenoma 02/10/2007  . PNA (pneumonia) 03/31/2012   Past Surgical History:  Procedure Laterality Date  . ATRIAL FIBRILLATION ABLATION  11/28/2016  . ATRIAL FIBRILLATION ABLATION N/A 11/28/2016   Procedure: Atrial Fibrillation Ablation;  Surgeon: Regan Lemming, MD;  Location: [facility] INVASIVE CV LAB;  Service: Cardiovascular;  Laterality: N/A;  . ATRIAL FIBRILLATION ABLATION N/A 03/18/2019   Procedure: ATRIAL FIBRILLATION ABLATION;  Surgeon: Regan Lemming, MD;  Location: [facility] INVASIVE CV LAB;  Service: Cardiovascular;  Laterality: N/A;  . CARDIOVERSION  05/03/2012   Procedure: CARDIOVERSION;  Surgeon: [name], MD;  Location: [facility] ENDOSCOPY;  Service: Cardiovascular;  Laterality: N/A;  . CARDIOVERSION  06/08/2012   Procedure: CARDIOVERSION;  Surgeon: [name], MD;  Location: [facility] ENDOSCOPY;  Service: Cardiovascular;  Laterality: N/A;  Lawrence Marseilles /janie  . CARDIOVERSION N/A 10/28/2013   Procedure: CARDIOVERSION;  Surgeon: [name], MD;  Location: [facility] ENDOSCOPY;  Service: Cardiovascular;  Laterality: N/A;  . CARDIOVERSION N/A 06/30/2014   Procedure: CARDIOVERSION;  Surgeon: [name], MD;  Location: [facility] ENDOSCOPY;  Service: Cardiovascular;  Laterality: N/A;  H&P in file  .  CARDIOVERSION N/A 09/05/2014   Procedure: CARDIOVERSION;  Surgeon: Jay Ganji, MD;  Location: [facility] ENDOSCOPY;  Service: Cardiovascular;  Laterality: N/A;  . CARDIOVERSION N/A 12/24/2015   Procedure: CARDIOVERSION;  Surgeon: Jay Ganji, MD;  Location: [facility] ENDOSCOPY;   Service: Cardiovascular;  Laterality: N/A;  . CARDIOVERSION N/A 01/30/2016   Procedure: CARDIOVERSION;  Surgeon: Jay Ganji, MD;  Location: [facility] ENDOSCOPY;  Service: Cardiovascular;  Laterality: N/A;  . CARDIOVERSION N/A 02/08/2016   Procedure: CARDIOVERSION;  Surgeon: Jay Ganji, MD;  Location: [facility] ENDOSCOPY;  Service: Cardiovascular;  Laterality: N/A;  . CARDIOVERSION N/A 02/08/2019   Procedure: CARDIOVERSION;  Surgeon: Ganji, Jay, MD;  Location: [facility] ENDOSCOPY;  Service: Cardiovascular;  Laterality: N/A;  . CATARACT EXTRACTION W/ INTRAOCULAR LENS  IMPLANT, BILATERAL Bilateral   . COLONOSCOPY    . INGUINAL HERNIA REPAIR Bilateral    as child  . KNEE ARTHROSCOPY Right 2004    Current Outpatient Medications  Medication Sig Dispense Refill  . acetaminophen (TYLENOL) 500 MG tablet Take 1,000 mg by mouth every 6 (six) hours as needed for moderate pain or headache.    . apixaban (ELIQUIS) 5 MG TABS tablet Take 1 tablet (5 mg total) by mouth 2 (two) times daily. 180 tablet 3  . lisinopril (ZESTRIL) 5 MG tablet Take 1 tablet (5 mg total) by mouth daily. 90 tablet 3  . psyllium (METAMUCIL) 58.6 % packet Take 1 packet by mouth daily as needed (constipation).     . rosuvastatin (CRESTOR) 5 MG tablet Take 1 tablet (5 mg total) by mouth daily. 90 tablet 3  . diltiazem (CARDIZEM) 30 MG tablet Take one tablet every 4 hours as needed for HR over 100 45 tablet 2  . dronedarone (MULTAQ) 400 MG tablet Take 1 tablet (400 mg total) by mouth 2 (two) times daily with a meal. 60 tablet    No current facility-administered medications for this encounter.     No Known Allergies  Social History   Socioeconomic History  . Marital status: Married    Spouse name: Not on file  . Number of children: 2  . Years of education: Not on file  . Highest education level: Not on file  Occupational History    Employer: [employer]  Social Needs  . Financial resource strain: Not on file  . Food insecurity    Worry: Not on file     Inability: Not on file  . Transportation needs    Medical: Not on file    Non-medical: Not on file  Tobacco Use  . Smoking status: Never Smoker  . Smokeless tobacco: Never Used  Substance and Sexual Activity  . Alcohol use: Yes    Alcohol/week: 1.0 standard drinks    Types: 1 Glasses of wine per week    Comment: daily  . Drug use: No  . Sexual activity: Not Currently  Lifestyle  . Physical activity    Days per week: Not on file    Minutes per session: Not on file  . Stress: Not on file  Relationships  . Social connections    Talks on phone: Not on file    Gets together: Not on file    Attends religious service: Not on file    Active member of club or organization: Not on file    Attends meetings of clubs or organizations: Not on file    Relationship status: Not on file  . Intimate partner violence    Fear of current or ex partner: Not on file      Emotionally abused: Not on file    Physically abused: Not on file    Forced sexual activity: Not on file  Other Topics Concern  . Not on file  Social History Narrative  . Not on file     ROS- All systems are reviewed and negative except as per the HPI above.  Physical Exam: Vitals:   05/17/19 0934  BP: 120/80  Pulse: (!) 140  SpO2: 97%  Weight: 88 kg  Height: 5' 10.5" (1.791 m)    GEN- The patient is well appearing elderly male, alert and oriented x 3 today.   Head- normocephalic, atraumatic Eyes-  Sclera clear, conjunctiva pink Ears- hearing intact Oropharynx- clear Neck- supple  Lungs- Clear to ausculation bilaterally, normal work of breathing Heart- Regular rhythm, tachycardia, no murmurs, rubs or gallops  GI- soft, NT, ND, + BS Extremities- no clubbing, cyanosis, or edema MS- no significant deformity or atrophy Skin- no rash or lesion Psych- euthymic mood, full affect Neuro- strength and sensation are intact  Wt Readings from Last 3 Encounters:  05/17/19 88 kg  03/31/19 86.1 kg  03/18/19 88.5 kg     EKG today demonstrates atypical atrial flutter HR 140, QRS 80, QTc 403  TTE 03/02/16  Review of the above records today demonstrates:  Mild concentric LVH. EF 68%. Moderately dilated left atrium. Mild tricuspid regurgitation without evidence of pulmonary hypertension.  Epic records are reviewed at length today  Assessment and Plan:  1. Persistent atrial fibrillation/atrial flutter S/p repeat ablation 03/18/19 with Dr [name]. Initially did well but now appears to be in atrial flutter. After discussing the risks and benefits, will arrange repeat DCCV. Will also plan to resume Multaq 400 mg BID the morning of his DCCV. Continue Eliquis 5 mg BID with no missed doses for at least 3 months post ablation. Will also start diltiazem 30 mg PRN q4hrs for heart racing. Will consider daily rate control if his heart rate remains elevated.   This patients CHA2DS2-VASc Score and unadjusted Ischemic Stroke Rate (% per year) is equal to 4.8 % stroke rate/year from a score of 4  Above score calculated as 1 point each if present [CHF, HTN, DM, Vascular=MI/PAD/Aortic Plaque, Age if 65-74, or Male] Above score calculated as 2 points each if present [Age > 75, or Stroke/TIA/TE]   2. HTN Stable, no changes today.  3. CAD Noted on CTA. No anginal symptoms. Followed by Dr Einar Gip.   Follow up one week post DCCV.    [facility] [address] [place], [place] [address] [phone] 05/17/2019 10:11 AM

## 2019-05-17 NOTE — Patient Instructions (Signed)
Cardioversion scheduled for December 8th at 1:30pm  - Arrive at the [facility] and go to admitting at 12:30pm  -Do not eat or drink anything after midnight the night prior to your procedure.  - Take all your morning medications with a sip of water prior to arrival.  - You will not be able to drive home after your procedure.  Morning of start Multaq 400mg  taking twice daily. Cardizem 30mg - take one tablet every 4 hours as needed for HR over 100 and your blood pressure top number is over 100.

## 2019-05-17 NOTE — Progress Notes (Addendum)
Primary Care Physician: [name], MD Primary Cardiologist: Dr Jacinto Halim Primary Electrophysiologist: Dr Elberta Fortis Referring Physician: Dr Ellyn Hack is a 78 y.o. male with a history of HTN, CAD, persistent atrial fibrillation who presents for follow up in the [facility]. He has had multiple cardioversions and has since been put on dofetilide. He was having breakthrough episodes of atrial fibrillation and had ablation on 11/28/16.  At his last visit, he presented to clinic in atrial fibrillation.  Dofetilide was stopped and he was started on amiodarone. He was then switch to [facility] 02/2018. He is on Eliquis for a CHADS2VASC score of 4.   On follow up today, patient is s/p repeat afib ablation with Dr Elberta Fortis 03/18/19. He reports that he felt well since his procedure with no symptoms until 05/12/19 when he began feeling more winded, palpitations, and an irregular pulse. There were no specific triggers that the patient could identify. His symptoms have been constant. His pulse oximeter has shown heart rates 60-90s.   Today, he denies symptoms of chest pain, orthopnea, PND, lower extremity edema, dizziness, presyncope, syncope, snoring, daytime somnolence, bleeding, or neurologic sequela. The patient is tolerating medications without difficulties and is otherwise without complaint today.    Atrial Fibrillation Risk Factors:  he does not have symptoms or diagnosis of sleep apnea. he does not have a history of rheumatic fever. he does not have a history of alcohol use.   he has a BMI of Body mass index is 27.44 kg/m.Marland Kitchen Filed Weights   05/17/19 0934  Weight: 88 kg    Family History  Problem Relation Age of Onset  . Heart disease Father   . Stroke Father   . Cancer Mother        bladder  . Colon cancer Neg Hx   . Stomach cancer Neg Hx      Atrial Fibrillation Management history:  Previous antiarrhythmic drugs: dofetilide, amiodarone,  Multaq Previous cardioversions: several, most recently 02/08/19 Previous ablations: 11/2016, 03/18/19 CHADS2VASC score: 4 Anticoagulation history: Eliquis   Past Medical History:  Diagnosis Date  . Arthritis    "right knee" (01/28/2016)  . Diverticulosis of colon (without mention of hemorrhage)   . Dysrhythmia   . Internal hemorrhoids without mention of complication   . Personal history of colonic polyps 01/17/2002   adenomatous, tublar adenoma 02/10/2007  . PNA (pneumonia) 03/31/2012   Past Surgical History:  Procedure Laterality Date  . ATRIAL FIBRILLATION ABLATION  11/28/2016  . ATRIAL FIBRILLATION ABLATION N/A 11/28/2016   Procedure: Atrial Fibrillation Ablation;  Surgeon: Regan Lemming, MD;  Location: [facility] INVASIVE CV LAB;  Service: Cardiovascular;  Laterality: N/A;  . ATRIAL FIBRILLATION ABLATION N/A 03/18/2019   Procedure: ATRIAL FIBRILLATION ABLATION;  Surgeon: Regan Lemming, MD;  Location: [facility] INVASIVE CV LAB;  Service: Cardiovascular;  Laterality: N/A;  . CARDIOVERSION  05/03/2012   Procedure: CARDIOVERSION;  Surgeon: [name], MD;  Location: [facility] ENDOSCOPY;  Service: Cardiovascular;  Laterality: N/A;  . CARDIOVERSION  06/08/2012   Procedure: CARDIOVERSION;  Surgeon: [name], MD;  Location: [facility] ENDOSCOPY;  Service: Cardiovascular;  Laterality: N/A;  Lawrence Marseilles /janie  . CARDIOVERSION N/A 10/28/2013   Procedure: CARDIOVERSION;  Surgeon: [name], MD;  Location: [facility] ENDOSCOPY;  Service: Cardiovascular;  Laterality: N/A;  . CARDIOVERSION N/A 06/30/2014   Procedure: CARDIOVERSION;  Surgeon: [name], MD;  Location: [facility] ENDOSCOPY;  Service: Cardiovascular;  Laterality: N/A;  H&P in file  .  CARDIOVERSION N/A 09/05/2014   Procedure: CARDIOVERSION;  Surgeon: Yates DecampJay Ganji, MD;  [facility];  Service: Cardiovascular;  Laterality: N/A;  . CARDIOVERSION N/A 12/24/2015   Procedure: CARDIOVERSION;  Surgeon: Yates DecampJay Ganji, MD;  [facility];   Service: Cardiovascular;  Laterality: N/A;  . CARDIOVERSION N/A 01/30/2016   Procedure: CARDIOVERSION;  Surgeon: Yates DecampJay Ganji, MD;  [facility];  Service: Cardiovascular;  Laterality: N/A;  . CARDIOVERSION N/A 02/08/2016   Procedure: CARDIOVERSION;  Surgeon: Yates DecampJay Ganji, MD;  [facility];  Service: Cardiovascular;  Laterality: N/A;  . CARDIOVERSION N/A 02/08/2019   Procedure: CARDIOVERSION;  Surgeon: Yates DecampGanji, Jay, MD;  [facility];  Service: Cardiovascular;  Laterality: N/A;  . CATARACT EXTRACTION W/ INTRAOCULAR LENS  IMPLANT, BILATERAL Bilateral   . COLONOSCOPY    . INGUINAL HERNIA REPAIR Bilateral    as child  . KNEE ARTHROSCOPY Right 2004    Current Outpatient Medications  Medication Sig Dispense Refill  . acetaminophen (TYLENOL) 500 MG tablet Take 1,000 mg by mouth every 6 (six) hours as needed for moderate pain or headache.    Marland Kitchen. apixaban (ELIQUIS) 5 MG TABS tablet Take 1 tablet (5 mg total) by mouth 2 (two) times daily. 180 tablet 3  . lisinopril (ZESTRIL) 5 MG tablet Take 1 tablet (5 mg total) by mouth daily. 90 tablet 3  . psyllium (METAMUCIL) 58.6 % packet Take 1 packet by mouth daily as needed (constipation).     . rosuvastatin (CRESTOR) 5 MG tablet Take 1 tablet (5 mg total) by mouth daily. 90 tablet 3  . diltiazem (CARDIZEM) 30 MG tablet Take one tablet every 4 hours as needed for HR over 100 45 tablet 2  . dronedarone (MULTAQ) 400 MG tablet Take 1 tablet (400 mg total) by mouth 2 (two) times daily with a meal. 60 tablet    No current facility-administered medications for this encounter.     No Known Allergies  Social History   Socioeconomic History  . Marital status: Married    Spouse name: Not on file  . Number of children: 2  . Years of education: Not on file  . Highest education level: Not on file  Occupational History    Employer: [employer]  Social Needs  . Financial resource strain: Not on file  . Food insecurity    Worry: Not on file     Inability: Not on file  . Transportation needs    Medical: Not on file    Non-medical: Not on file  Tobacco Use  . Smoking status: Never Smoker  . Smokeless tobacco: Never Used  Substance and Sexual Activity  . Alcohol use: Yes    Alcohol/week: 1.0 standard drinks    Types: 1 Glasses of wine per week    Comment: daily  . Drug use: No  . Sexual activity: Not Currently  Lifestyle  . Physical activity    Days per week: Not on file    Minutes per session: Not on file  . Stress: Not on file  Relationships  . Social Musicianconnections    Talks on phone: Not on file    Gets together: Not on file    Attends religious service: Not on file    Active member of club or organization: Not on file    Attends meetings of clubs or organizations: Not on file    Relationship status: Not on file  . Intimate partner violence    Fear of current or ex partner: Not on file  Emotionally abused: Not on file    Physically abused: Not on file    Forced sexual activity: Not on file  Other Topics Concern  . Not on file  Social History Narrative  . Not on file     ROS- All systems are reviewed and negative except as per the HPI above.  Physical Exam: Vitals:   05/17/19 0934  BP: 120/80  Pulse: (!) 140  SpO2: 97%  Weight: 88 kg  Height: 5' 10.5" (1.791 m)    GEN- The patient is well appearing elderly male, alert and oriented x 3 today.   Head- normocephalic, atraumatic Eyes-  Sclera clear, conjunctiva pink Ears- hearing intact Oropharynx- clear Neck- supple  Lungs- Clear to ausculation bilaterally, normal work of breathing Heart- Regular rhythm, tachycardia, no murmurs, rubs or gallops  GI- soft, NT, ND, + BS Extremities- no clubbing, cyanosis, or edema MS- no significant deformity or atrophy Skin- no rash or lesion Psych- euthymic mood, full affect Neuro- strength and sensation are intact  Wt Readings from Last 3 Encounters:  05/17/19 88 kg  03/31/19 86.1 kg  03/18/19 88.5 kg     EKG today demonstrates atypical atrial flutter HR 140, QRS 80, QTc 403  TTE 03/02/16  Review of the above records today demonstrates:  Mild concentric LVH. EF 68%. Moderately dilated left atrium. Mild tricuspid regurgitation without evidence of pulmonary hypertension.  Epic records are reviewed at length today  Assessment and Plan:  1. Persistent atrial fibrillation/atrial flutter S/p repeat ablation 03/18/19 with Dr [name]. Initially did well but now appears to be in atrial flutter. After discussing the risks and benefits, will arrange repeat DCCV. Will also plan to resume Multaq 400 mg BID the morning of his DCCV. Continue Eliquis 5 mg BID with no missed doses for at least 3 months post ablation. Will also start diltiazem 30 mg PRN q4hrs for heart racing. Will consider daily rate control if his heart rate remains elevated.   This patients CHA2DS2-VASc Score and unadjusted Ischemic Stroke Rate (% per year) is equal to 4.8 % stroke rate/year from a score of 4  Above score calculated as 1 point each if present [CHF, HTN, DM, Vascular=MI/PAD/Aortic Plaque, Age if 65-74, or Male] Above score calculated as 2 points each if present [Age > 75, or Stroke/TIA/TE]   2. HTN Stable, no changes today.  3. CAD Noted on CTA. No anginal symptoms. Followed by Dr Einar Gip.   Follow up one week post DCCV.    [facility] [address] [place], [place] [address] [phone] 05/17/2019 10:11 AM

## 2019-05-19 ENCOUNTER — Telehealth (HOSPITAL_COMMUNITY): Payer: Self-pay | Admitting: *Deleted

## 2019-05-19 MED ORDER — METOPROLOL TARTRATE 25 MG PO TABS: 25.0000 mg | ORAL_TABLET | Freq: Two times a day (BID) | ORAL | 3 refills | Status: DC

## 2019-05-19 NOTE — Telephone Encounter (Signed)
Patient called in stating his HR is starting to increase to 130-150s BP 120/90 increased shortness of breath. Pt has used cardizem 30mg  tabs PRN but only helps short term.  Discussed with Adline Peals PA -- will start metoprolol tartrate 25mg  BID - he will stop day of cardioversion due to bradycardia in rhythm. ER precautions discussed. Pt verbalized understanding of instructions. Will call back if HR does not settle down for up titration of metoprolol.

## 2019-05-20 ENCOUNTER — Other Ambulatory Visit (HOSPITAL_COMMUNITY)
Admission: RE | Admit: 2019-05-20 | Discharge: 2019-05-20 | Disposition: A | Discharge status: Home / Self Care | Payer: Medicare Other | Source: Ambulatory Visit | Attending: Internal Medicine | Admitting: Internal Medicine

## 2019-05-20 ENCOUNTER — Other Ambulatory Visit: Payer: Self-pay

## 2019-05-20 ENCOUNTER — Ambulatory Visit (HOSPITAL_COMMUNITY)
Admission: RE | Admit: 2019-05-20 | Discharge: 2019-05-20 | Disposition: A | Discharge status: Home / Self Care | Payer: Medicare Other | Source: Ambulatory Visit | Attending: Physician Assistant | Admitting: Physician Assistant

## 2019-05-20 DIAGNOSIS — Z01818 Encounter for other preprocedural examination: Secondary | ICD-10-CM | POA: Diagnosis not present

## 2019-05-20 DIAGNOSIS — R0602 Shortness of breath: Secondary | ICD-10-CM | POA: Diagnosis not present

## 2019-05-20 DIAGNOSIS — R002 Palpitations: Secondary | ICD-10-CM | POA: Insufficient documentation

## 2019-05-20 DIAGNOSIS — Z79899 Other long term (current) drug therapy: Secondary | ICD-10-CM | POA: Diagnosis not present

## 2019-05-20 DIAGNOSIS — I4892 Unspecified atrial flutter: Secondary | ICD-10-CM | POA: Diagnosis not present

## 2019-05-20 DIAGNOSIS — Z20828 Contact with and (suspected) exposure to other viral communicable diseases: Secondary | ICD-10-CM | POA: Diagnosis not present

## 2019-05-20 DIAGNOSIS — I484 Atypical atrial flutter: Secondary | ICD-10-CM

## 2019-05-20 MED ORDER — DILTIAZEM HCL ER COATED BEADS 120 MG PO CP24: 120.0000 mg | ORAL_CAPSULE | Freq: Every day | ORAL | Status: DC

## 2019-05-20 NOTE — Patient Instructions (Signed)
Start Cardizem 120mg  once day at lunchtime -- you will stop this medication day of cardioversion   Continue metoprolol 25mg  twice a day

## 2019-05-20 NOTE — Progress Notes (Signed)
Patient presents for ECG. ECG shows atrial flutter HR 145, QRS 84, QTc 509. Patient reports he took his PRN diltiazem this AM and his heart rate decreased to around 70 bpm but his wore off after a few hours. Will have him resume diltiazem 120 mg at lunchtime. He will stop this the day of DCCV. Continue metoprolol. His symptoms of palpitations and SOB are unchanged.  

## 2019-05-20 NOTE — H&P (View-Only) (Signed)
Patient presents for ECG. ECG shows atrial flutter HR 145, QRS 84, QTc 509. Patient reports he took his PRN diltiazem this AM and his heart rate decreased to around 70 bpm but his wore off after a few hours. Will have him resume diltiazem 120 mg at lunchtime. He will stop this the day of DCCV. Continue metoprolol. His symptoms of palpitations and SOB are unchanged.

## 2019-05-21 LAB — NOVEL CORONAVIRUS, NAA (HOSP ORDER, SEND-OUT TO REF LAB; TAT 18-24 HRS): SARS-CoV-2, NAA: NOT DETECTED

## 2019-05-23 NOTE — Progress Notes (Signed)
Pre-op call done. Patient states he has been quarantined since COVID test. All questions addressed.

## 2019-05-24 ENCOUNTER — Ambulatory Visit (HOSPITAL_COMMUNITY)
Admission: RE | Admit: 2019-05-24 | Discharge: 2019-05-24 | Disposition: A | Discharge status: Home / Self Care | Payer: Medicare Other | Attending: Internal Medicine | Admitting: Internal Medicine

## 2019-05-24 ENCOUNTER — Other Ambulatory Visit: Payer: Self-pay

## 2019-05-24 ENCOUNTER — Ambulatory Visit (HOSPITAL_COMMUNITY): Payer: Medicare Other | Admitting: Certified Registered"

## 2019-05-24 ENCOUNTER — Ambulatory Visit (HOSPITAL_COMMUNITY)
Admission: RE | Admit: 2019-05-24 | Discharge: 2019-05-24 | Disposition: A | Discharge status: Home / Self Care | Payer: Medicare Other | Source: Ambulatory Visit | Attending: Physician Assistant | Admitting: Physician Assistant

## 2019-05-24 ENCOUNTER — Encounter (HOSPITAL_COMMUNITY): Payer: Self-pay

## 2019-05-24 ENCOUNTER — Encounter (HOSPITAL_COMMUNITY)
Admission: RE | Disposition: A | Discharge status: Home / Self Care | Payer: Medicare Other | Source: Home / Self Care | Attending: Internal Medicine

## 2019-05-24 DIAGNOSIS — I484 Atypical atrial flutter: Secondary | ICD-10-CM

## 2019-05-24 DIAGNOSIS — I4819 Other persistent atrial fibrillation: Secondary | ICD-10-CM | POA: Insufficient documentation

## 2019-05-24 DIAGNOSIS — K644 Residual hemorrhoidal skin tags: Secondary | ICD-10-CM | POA: Diagnosis not present

## 2019-05-24 DIAGNOSIS — Z79899 Other long term (current) drug therapy: Secondary | ICD-10-CM | POA: Diagnosis not present

## 2019-05-24 DIAGNOSIS — I2510 Atherosclerotic heart disease of native coronary artery without angina pectoris: Secondary | ICD-10-CM | POA: Insufficient documentation

## 2019-05-24 DIAGNOSIS — I4892 Unspecified atrial flutter: Secondary | ICD-10-CM | POA: Diagnosis not present

## 2019-05-24 DIAGNOSIS — Z7901 Long term (current) use of anticoagulants: Secondary | ICD-10-CM | POA: Diagnosis not present

## 2019-05-24 DIAGNOSIS — I10 Essential (primary) hypertension: Secondary | ICD-10-CM | POA: Insufficient documentation

## 2019-05-24 DIAGNOSIS — I480 Paroxysmal atrial fibrillation: Secondary | ICD-10-CM | POA: Diagnosis not present

## 2019-05-24 DIAGNOSIS — M1711 Unilateral primary osteoarthritis, right knee: Secondary | ICD-10-CM | POA: Diagnosis not present

## 2019-05-24 HISTORY — PX: CARDIOVERSION: SHX1299

## 2019-05-24 SURGERY — CARDIOVERSION
Anesthesia: General

## 2019-05-24 MED ORDER — PROPOFOL 10 MG/ML IV BOLUS
INTRAVENOUS | Status: DC | PRN
  Administered 2019-05-24: 80 mg via INTRAVENOUS

## 2019-05-24 MED ORDER — SODIUM CHLORIDE 0.9 % IV SOLN
INTRAVENOUS | Status: DC
  Administered 2019-05-24: 13:00:00 via INTRAVENOUS

## 2019-05-24 MED ORDER — LIDOCAINE 2% (20 MG/ML) 5 ML SYRINGE
INTRAMUSCULAR | Status: DC | PRN
  Administered 2019-05-24: 100 mg via INTRAVENOUS

## 2019-05-24 NOTE — Anesthesia Preprocedure Evaluation (Signed)
Anesthesia Evaluation  Patient identified by MRN, date of birth, ID band Patient awake    Reviewed: Allergy & Precautions, NPO status , Patient's Chart, lab work & pertinent test results  Airway Mallampati: I  TM Distance: >3 FB Neck ROM: Full    Dental   Pulmonary    Pulmonary exam normal        Cardiovascular hypertension, Pt. on medications Normal cardiovascular exam+ dysrhythmias Atrial Fibrillation      Neuro/Psych    GI/Hepatic   Endo/Other    Renal/GU Renal InsufficiencyRenal disease     Musculoskeletal   Abdominal   Peds  Hematology   Anesthesia Other Findings   Reproductive/Obstetrics                             Anesthesia Physical Anesthesia Plan  ASA: III  Anesthesia Plan: General   Post-op Pain Management:    Induction: Intravenous  PONV Risk Score and Plan: Treatment may vary due to age or medical condition  Airway Management Planned: Mask  Additional Equipment:   Intra-op Plan:   Post-operative Plan:   Informed Consent: I have reviewed the patients History and Physical, chart, labs and discussed the procedure including the risks, benefits and alternatives for the proposed anesthesia with the patient or authorized representative who has indicated his/her understanding and acceptance.       Plan Discussed with: CRNA and Surgeon  Anesthesia Plan Comments:         Anesthesia Quick Evaluation

## 2019-05-24 NOTE — Transfer of Care (Signed)
Immediate Anesthesia Transfer of Care Note  Patient: Gregory [name]  Procedure(s) Performed: CARDIOVERSION (N/A )  Patient Location: Endoscopy Unit  Anesthesia Type:General  Level of Consciousness: drowsy and patient cooperative  Airway & Oxygen Therapy: Patient Spontanous Breathing  Post-op Assessment: Report given to RN and Post -op Vital signs reviewed and stable  Post vital signs: Reviewed and stable  Last Vitals:  Vitals Value Taken Time  BP    Temp    Pulse    Resp    SpO2      Last Pain:  Vitals:   05/24/19 1240  TempSrc: Temporal  PainSc: 0-No pain         Complications: No apparent anesthesia complications

## 2019-05-24 NOTE — Anesthesia Postprocedure Evaluation (Signed)
Anesthesia Post Note  Patient: Gregory [name]  Procedure(s) Performed: CARDIOVERSION (N/A )     Patient location during evaluation: PACU Anesthesia Type: General Level of consciousness: awake and alert Pain management: pain level controlled Vital Signs Assessment: post-procedure vital signs reviewed and stable Respiratory status: spontaneous breathing, nonlabored ventilation, respiratory function stable and patient connected to nasal cannula oxygen Cardiovascular status: blood pressure returned to baseline and stable Postop Assessment: no apparent nausea or vomiting Anesthetic complications: no    Last Vitals:  Vitals:   05/24/19 1430 05/24/19 1440  BP: 100/71 115/74  Pulse: (!) 58 63  Resp: (!) 23 19  Temp:    SpO2: 98% 99%    Last Pain:  Vitals:   05/24/19 1440  TempSrc:   PainSc: 0-No pain                 Liahna Brickner DAVID

## 2019-05-24 NOTE — Interval H&P Note (Signed)
History and Physical Interval Note:  05/24/2019 1:42 PM  Gregory [name]  has presented today for surgery, with the diagnosis of AFIB.  The various methods of treatment have been discussed with the patient and family. After consideration of risks, benefits and other options for treatment, the patient has consented to  Procedure(s): CARDIOVERSION (N/A) as a surgical intervention.  The patient's history has been reviewed, patient examined, no change in status, stable for surgery.  I have reviewed the patient's chart and labs.  Questions were answered to the patient's satisfaction.     Elouise Munroe

## 2019-05-24 NOTE — Interval H&P Note (Signed)
History and Physical Interval Note:  05/24/2019 1:43 PM  Gregory [name]  has presented today for surgery, with the diagnosis of AFIB.  The various methods of treatment have been discussed with the patient and family. After consideration of risks, benefits and other options for treatment, the patient has consented to  Procedure(s): CARDIOVERSION (N/A) as a surgical intervention.  The patient's history has been reviewed, patient examined, no change in status, stable for surgery.  I have reviewed the patient's chart and labs.  Questions were answered to the patient's satisfaction.     Elouise Munroe

## 2019-05-24 NOTE — Discharge Instructions (Signed)
Hold diltiazem due to low blood pressure until you follow up with Afib Clinic.     Electrical Cardioversion, Care After This sheet gives you information about how to care for yourself after your procedure. Your health care provider may also give you more specific instructions. If you have problems or questions, contact your health care provider. What can I expect after the procedure? After the procedure, it is common to have:  Some redness on the skin where the shocks were given. Follow these instructions at home:   Do not drive for 24 hours if you were given a medicine to help you relax (sedative).  Take over-the-counter and prescription medicines only as told by your health care provider.  Ask your health care provider how to check your pulse. Check it often.  Rest for 48 hours after the procedure or as told by your health care provider.  Avoid or limit your caffeine use as told by your health care provider. Contact a health care provider if:  You feel like your heart is beating too quickly or your pulse is not regular.  You have a serious muscle cramp that does not go away. Get help right away if:   You have discomfort in your chest.  You are dizzy or you feel faint.  You have trouble breathing or you are short of breath.  Your speech is slurred.  You have trouble moving an arm or leg on one side of your body.  Your fingers or toes turn cold or blue. This information is not intended to replace advice given to you by your health care provider. Make sure you discuss any questions you have with your health care provider. Document Released: 03/23/2013 Document Revised: 05/15/2017 Document Reviewed: 12/07/2015 Elsevier Patient Education  2020 [employer].

## 2019-05-24 NOTE — CV Procedure (Signed)
Procedure: Electrical Cardioversion Indications:  Atrial Flutter  Procedure Details:  Consent: Risks of procedure as well as the alternatives and risks of each were explained to the (patient/caregiver).  Consent for procedure obtained.  Time Out: Verified patient identification, verified procedure, site/side was marked, verified correct patient position, special equipment/implants available, medications/allergies/relevent history reviewed, required imaging and test results available. PERFORMED.  Patient placed on cardiac monitor, pulse oximetry, supplemental oxygen as necessary.  Sedation given: propofol per anesthesia Pacer pads placed anterior and posterior chest.  Cardioverted 1 time(s).  Cardioversion with synchronized biphasic 120J shock.  Evaluation: Findings: Post procedure EKG shows: NSR Complications: None Patient did tolerate procedure well.  Time Spent Directly with the Patient:  30 minutes   Elouise Munroe 05/24/2019, 2:12 PM

## 2019-05-24 NOTE — Telephone Encounter (Signed)
Please read

## 2019-05-27 DIAGNOSIS — R0789 Other chest pain: Secondary | ICD-10-CM | POA: Diagnosis not present

## 2019-05-31 ENCOUNTER — Ambulatory Visit (HOSPITAL_COMMUNITY)
Admission: RE | Admit: 2019-05-31 | Discharge: 2019-05-31 | Disposition: A | Discharge status: Home / Self Care | Payer: Medicare Other | Source: Ambulatory Visit | Attending: Physician Assistant | Admitting: Physician Assistant

## 2019-05-31 ENCOUNTER — Other Ambulatory Visit: Payer: Self-pay

## 2019-05-31 ENCOUNTER — Encounter (HOSPITAL_COMMUNITY): Payer: Self-pay | Admitting: Physician Assistant

## 2019-05-31 VITALS — BP 120/76 | HR 51 | Ht 70.5 in | Wt 194.2 lb

## 2019-05-31 DIAGNOSIS — I2510 Atherosclerotic heart disease of native coronary artery without angina pectoris: Secondary | ICD-10-CM | POA: Insufficient documentation

## 2019-05-31 DIAGNOSIS — R001 Bradycardia, unspecified: Secondary | ICD-10-CM | POA: Diagnosis not present

## 2019-05-31 DIAGNOSIS — Z823 Family history of stroke: Secondary | ICD-10-CM | POA: Diagnosis not present

## 2019-05-31 DIAGNOSIS — R9431 Abnormal electrocardiogram [ECG] [EKG]: Secondary | ICD-10-CM | POA: Diagnosis not present

## 2019-05-31 DIAGNOSIS — Z7901 Long term (current) use of anticoagulants: Secondary | ICD-10-CM | POA: Insufficient documentation

## 2019-05-31 DIAGNOSIS — Z8249 Family history of ischemic heart disease and other diseases of the circulatory system: Secondary | ICD-10-CM | POA: Insufficient documentation

## 2019-05-31 DIAGNOSIS — M1711 Unilateral primary osteoarthritis, right knee: Secondary | ICD-10-CM | POA: Diagnosis not present

## 2019-05-31 DIAGNOSIS — Z8052 Family history of malignant neoplasm of bladder: Secondary | ICD-10-CM | POA: Diagnosis not present

## 2019-05-31 DIAGNOSIS — I10 Essential (primary) hypertension: Secondary | ICD-10-CM | POA: Diagnosis not present

## 2019-05-31 DIAGNOSIS — D6869 Other thrombophilia: Secondary | ICD-10-CM

## 2019-05-31 DIAGNOSIS — I4819 Other persistent atrial fibrillation: Secondary | ICD-10-CM | POA: Diagnosis not present

## 2019-05-31 DIAGNOSIS — Z79899 Other long term (current) drug therapy: Secondary | ICD-10-CM | POA: Diagnosis not present

## 2019-05-31 DIAGNOSIS — I4892 Unspecified atrial flutter: Secondary | ICD-10-CM | POA: Diagnosis not present

## 2019-05-31 NOTE — Progress Notes (Signed)
Primary Care Physician: Christain Sacramento, MD Primary Cardiologist: Dr Einar Gip Primary Electrophysiologist: Dr [name] Referring Physician: Dr Marylen Ponto is a 78 y.o. male with a history of HTN, CAD, persistent atrial fibrillation who presents for follow up in the [facility]. He has had multiple cardioversions and has since been put on dofetilide. He was having breakthrough episodes of atrial fibrillation and had ablation on 11/28/16.  At his last visit, he presented to clinic in atrial fibrillation.  Dofetilide was stopped and he was started on amiodarone. He was then switch to [facility] 02/2018. He is on Eliquis for a CHADS2VASC score of 4. Patient is s/p repeat afib ablation with Dr [name] 03/18/19. He reports that he felt well since his procedure with no symptoms until 05/12/19 when he began feeling more winded, palpitations, and an irregular pulse. There were no specific triggers that the patient could identify.   On follow up today, patient is s/p DCCV on 05/24/19. Patient reports that he has done well since his last visit. He has not had any heart racing or palpitations. His SOB has resolved. He does note that his pulse rate drops into the 40's at times. No dizziness or presyncope.  Today, he denies symptoms of palpitations, chest pain, orthopnea, PND, lower extremity edema, dizziness, presyncope, syncope, snoring, daytime somnolence, bleeding, or neurologic sequela. The patient is tolerating medications without difficulties and is otherwise without complaint today.    Atrial Fibrillation Risk Factors:  he does not have symptoms or diagnosis of sleep apnea. he does not have a history of rheumatic fever. he does not have a history of alcohol use.   he has a BMI of There is no height or weight on file to calculate BMI.. There were no vitals filed for this visit.  Family History  Problem Relation Age of Onset  . Heart disease Father   . Stroke  Father   . Cancer Mother        bladder  . Colon cancer Neg Hx   . Stomach cancer Neg Hx      Atrial Fibrillation Management history:  Previous antiarrhythmic drugs: dofetilide, amiodarone, Multaq Previous cardioversions: several, most recently 02/08/19, 05/24/19 Previous ablations: 11/2016, 03/18/19 CHADS2VASC score: 4 Anticoagulation history: Eliquis   Past Medical History:  Diagnosis Date  . Arthritis    "right knee" (01/28/2016)  . Diverticulosis of colon (without mention of hemorrhage)   . Dysrhythmia   . Internal hemorrhoids without mention of complication   . Personal history of colonic polyps 01/17/2002   adenomatous, tublar adenoma 02/10/2007  . PNA (pneumonia) 03/31/2012   Past Surgical History:  Procedure Laterality Date  . ATRIAL FIBRILLATION ABLATION  11/28/2016  . ATRIAL FIBRILLATION ABLATION N/A 11/28/2016   Procedure: Atrial Fibrillation Ablation;  Surgeon: Constance Haw, MD;  Location: [place] [facility];  Service: Cardiovascular;  Laterality: N/A;  . ATRIAL FIBRILLATION ABLATION N/A 03/18/2019   Procedure: ATRIAL FIBRILLATION ABLATION;  Surgeon: Constance Haw, MD;  Location: [place] [facility];  Service: Cardiovascular;  Laterality: N/A;  . CARDIOVERSION  05/03/2012   Procedure: CARDIOVERSION;  Surgeon: Laverda Page, MD;  Location: [facility] ENDOSCOPY;  Service: Cardiovascular;  Laterality: N/A;  . CARDIOVERSION  06/08/2012   Procedure: CARDIOVERSION;  Surgeon: Laverda Page, MD;  Location: [place];  Service: Cardiovascular;  Laterality: N/A;  [name]  . CARDIOVERSION N/A 10/28/2013   Procedure: CARDIOVERSION;  Surgeon: Laverda Page, MD;  Location: [facility]  ENDOSCOPY;  Service: Cardiovascular;  Laterality: N/A;  . CARDIOVERSION N/A 06/30/2014   Procedure: CARDIOVERSION;  Surgeon: Pamella PertJagadeesh R Ganji, MD;  [facility];  Service: Cardiovascular;  Laterality: N/A;  H&P in file  . CARDIOVERSION N/A 09/05/2014   Procedure: CARDIOVERSION;   Surgeon: Yates DecampJay Ganji, MD;  [facility];  Service: Cardiovascular;  Laterality: N/A;  . CARDIOVERSION N/A 12/24/2015   Procedure: CARDIOVERSION;  Surgeon: Yates DecampJay Ganji, MD;  [facility];  Service: Cardiovascular;  Laterality: N/A;  . CARDIOVERSION N/A 01/30/2016   Procedure: CARDIOVERSION;  Surgeon: Yates DecampJay Ganji, MD;  [facility];  Service: Cardiovascular;  Laterality: N/A;  . CARDIOVERSION N/A 02/08/2016   Procedure: CARDIOVERSION;  Surgeon: Yates DecampJay Ganji, MD;  [facility];  Service: Cardiovascular;  Laterality: N/A;  . CARDIOVERSION N/A 02/08/2019   Procedure: CARDIOVERSION;  Surgeon: Yates DecampGanji, Jay, MD;  [facility];  Service: Cardiovascular;  Laterality: N/A;  . CARDIOVERSION N/A 05/24/2019   Procedure: CARDIOVERSION;  Surgeon: Parke PoissonAcharya, Gayatri A, MD;  [facility];  Service: Cardiovascular;  Laterality: N/A;  . CATARACT EXTRACTION W/ INTRAOCULAR LENS  IMPLANT, BILATERAL Bilateral   . COLONOSCOPY    . INGUINAL HERNIA REPAIR Bilateral    as child  . KNEE ARTHROSCOPY Right 2004    Current Outpatient Medications  Medication Sig Dispense Refill  . acetaminophen (TYLENOL) 500 MG tablet Take 1,000 mg by mouth every 6 (six) hours as needed for moderate pain or headache.    Marland Kitchen. apixaban (ELIQUIS) 5 MG TABS tablet Take 1 tablet (5 mg total) by mouth 2 (two) times daily. 180 tablet 3  . diltiazem (CARDIZEM) 30 MG tablet Take one tablet every 4 hours as needed for HR over 100 45 tablet 2  . dronedarone (MULTAQ) 400 MG tablet Take 1 tablet (400 mg total) by mouth 2 (two) times daily with a meal. 60 tablet   . lisinopril (ZESTRIL) 5 MG tablet Take 1 tablet (5 mg total) by mouth daily. 90 tablet 3  . metoprolol tartrate (LOPRESSOR) 25 MG tablet Take 1 tablet (25 mg total) by mouth 2 (two) times daily. 60 tablet 3  . psyllium (METAMUCIL) 58.6 % packet Take 1 packet by mouth daily as needed (constipation).     . rosuvastatin (CRESTOR) 5 MG tablet Take 1 tablet  (5 mg total) by mouth daily. 90 tablet 3   No current facility-administered medications for this visit.    No Known Allergies  Social History   Socioeconomic History  . Marital status: Married    Spouse name: Not on file  . Number of children: 2  . Years of education: Not on file  . Highest education level: Not on file  Occupational History    Employer: [employer]  Tobacco Use  . Smoking status: Never Smoker  . Smokeless tobacco: Never Used  Substance and Sexual Activity  . Alcohol use: Yes    Alcohol/week: 1.0 standard drinks    Types: 1 Glasses of wine per week    Comment: daily  . Drug use: No  . Sexual activity: Not Currently  Other Topics Concern  . Not on file  Social History Narrative  . Not on file   Social Determinants of Health   Financial Resource Strain:   . Difficulty of Paying Living Expenses: Not on file  Food Insecurity:   . Worried About Programme researcher, broadcasting/film/videounning Out of Food in the Last Year: Not on file  . Ran Out of Food in the Last Year: Not on file  Transportation  Needs:   . Lack of Transportation (Medical): Not on file  . Lack of Transportation (Non-Medical): Not on file  Physical Activity:   . Days of Exercise per Week: Not on file  . Minutes of Exercise per Session: Not on file  Stress:   . Feeling of Stress : Not on file  Social Connections:   . Frequency of Communication with Friends and Family: Not on file  . Frequency of Social Gatherings with Friends and Family: Not on file  . Attends Religious Services: Not on file  . Active Member of Clubs or Organizations: Not on file  . Attends Banker Meetings: Not on file  . Marital Status: Not on file  Intimate Partner Violence:   . Fear of Current or Ex-Partner: Not on file  . Emotionally Abused: Not on file  . Physically Abused: Not on file  . Sexually Abused: Not on file     ROS- All systems are reviewed and negative except as per the HPI above.  Physical Exam: There were no vitals filed  for this visit.  GEN- The patient is well appearing elderly male, alert and oriented x 3 today.   HEENT-head normocephalic, atraumatic, sclera clear, conjunctiva pink, hearing intact, trachea midline. Lungs- Clear to ausculation bilaterally, normal work of breathing Heart- Regular rhythm, bradycardia, no murmurs, rubs or gallops  GI- soft, NT, ND, + BS Extremities- no clubbing, cyanosis, or edema MS- no significant deformity or atrophy Skin- no rash or lesion Psych- euthymic mood, full affect Neuro- strength and sensation are intact   Wt Readings from Last 3 Encounters:  05/17/19 194 lb (88 kg)  03/31/19 189 lb 14.4 oz (86.1 kg)  03/18/19 195 lb (88.5 kg)    EKG today demonstrates SB HR 51, NST, QRS 88, QTc 370  TTE 03/02/16  Review of the above records today demonstrates:  Mild concentric LVH. EF 68%. Moderately dilated left atrium. Mild tricuspid regurgitation without evidence of pulmonary hypertension.  Epic records are reviewed at length today  Assessment and Plan:  1. Persistent atrial fibrillation/atrial flutter S/p repeat ablation 03/18/19 with Dr Elberta Fortis. S/p DCCV on 05/24/19. Patient appears to be maintaining SR. Continue Multaq 400 mg BID Continue Eliquis 5 mg BID with no missed doses for at least 3 months post ablation. Continue diltiazem 30 mg PRN q4hrs for heart racing. Will stop metoprolol 2/2 to bradycardia.   This patients CHA2DS2-VASc Score and unadjusted Ischemic Stroke Rate (% per year) is equal to 4.8 % stroke rate/year from a score of 4  Above score calculated as 1 point each if present [CHF, HTN, DM, Vascular=MI/PAD/Aortic Plaque, Age if 65-74, or Male] Above score calculated as 2 points each if present [Age > 75, or Stroke/TIA/TE]   2. HTN Stable, med changes as above.  3. CAD Noted on CTA. No anginal symptoms today. Followed by Dr Jacinto Halim.   Follow up with Dr Jacinto Halim and Dr Elberta Fortis as scheduled. AF clinic as needed.   Jorja Loa PA-C Afib  Clinic [facility] [address] [place], [place] [address] [phone] 05/31/2019 8:50 AM

## 2019-06-25 ENCOUNTER — Ambulatory Visit: Payer: Medicare Other | Attending: Internal Medicine

## 2019-06-25 DIAGNOSIS — Z23 Encounter for immunization: Secondary | ICD-10-CM

## 2019-06-25 NOTE — Progress Notes (Signed)
   Covid-19 Vaccination Clinic  Name:  Gregory [name]    MRN: [id number] DOB: Sep 14, 1940  06/25/2019  Mr. Gregory [name] was observed post Covid-19 immunization for 15 minutes without incidence. He was provided with Vaccine Information Sheet and instruction to access the V-Safe system.   Mr. Gregory [name] was instructed to call 911 with any severe reactions post vaccine: Marland Kitchen Difficulty breathing  . Swelling of your face and throat  . A fast heartbeat  . A bad rash all over your body  . Dizziness and weakness    Immunizations Administered    Name Date Dose VIS Date Route   Pfizer COVID-19 Vaccine 06/25/2019 11:31 AM 0.3 mL 05/27/2019 Intramuscular   Manufacturer: [employer], [employer]   Lot: [id number]   NDC: [address]-8

## 2019-07-01 ENCOUNTER — Ambulatory Visit (INDEPENDENT_AMBULATORY_CARE_PROVIDER_SITE_OTHER): Payer: Medicare Other | Admitting: Cardiology

## 2019-07-01 ENCOUNTER — Encounter: Payer: Self-pay | Admitting: Cardiology

## 2019-07-01 ENCOUNTER — Other Ambulatory Visit: Payer: Self-pay

## 2019-07-01 VITALS — BP 127/79 | HR 70 | Temp 96.8°F | Ht 70.5 in | Wt 193.0 lb

## 2019-07-01 DIAGNOSIS — I10 Essential (primary) hypertension: Secondary | ICD-10-CM

## 2019-07-01 DIAGNOSIS — N1832 Chronic kidney disease, stage 3b: Secondary | ICD-10-CM | POA: Diagnosis not present

## 2019-07-01 DIAGNOSIS — E7800 Pure hypercholesterolemia, unspecified: Secondary | ICD-10-CM

## 2019-07-01 DIAGNOSIS — N401 Enlarged prostate with lower urinary tract symptoms: Secondary | ICD-10-CM

## 2019-07-01 DIAGNOSIS — I480 Paroxysmal atrial fibrillation: Secondary | ICD-10-CM

## 2019-07-01 DIAGNOSIS — I129 Hypertensive chronic kidney disease with stage 1 through stage 4 chronic kidney disease, or unspecified chronic kidney disease: Secondary | ICD-10-CM | POA: Diagnosis not present

## 2019-07-01 DIAGNOSIS — R3911 Hesitancy of micturition: Secondary | ICD-10-CM

## 2019-07-01 NOTE — Progress Notes (Signed)
Primary Physician/Referring:  Christain Sacramento, MD  Patient ID: Gregory [name], male    DOB: 1941/04/01, 79 y.o.   MRN: [id number]  Subjective   Chief Complaint  Patient presents with  . Coronary Artery Disease  . Hypertension  . Atrial Fibrillation  . Follow-up    3 month    [name]  is a 79 y.o. male   Caucasian male with history of stage 3b CKD, hypertension, mild hyperlipidemia and moderate CAD by coronary CTA, paroxysmal atrial fibrillation. He has history of multiple cardioversions and eventually dofetilide was discontinued by Dr. [name] due to efficacy, and was on Multaq and has maintained sinus since Dec 2018. Due to recurrence of atrial fibrillation, he underwent 2nd ablation on 02/22/2019, 1st ablation being on 11/28/2016.  He had repeat cardioversion on 05/24/2019.   He is tolerating anticoagulation with Eliquis, No GI bleed, no dark stools.  On Multaq presently. C/O chest pain yesterday lasting all day and mild and worse with left arm movement. Also c/o thin stream in urine and want PSA.  Past Medical History:  Diagnosis Date  . Arthritis    "right knee" (01/28/2016)  . Diverticulosis of colon (without mention of hemorrhage)   . Dysrhythmia   . Internal hemorrhoids without mention of complication   . Personal history of colonic polyps 01/17/2002   adenomatous, tublar adenoma 02/10/2007  . PNA (pneumonia) 03/31/2012    Past Surgical History:  Procedure Laterality Date  . ATRIAL FIBRILLATION ABLATION  11/28/2016  . ATRIAL FIBRILLATION ABLATION N/A 11/28/2016   Procedure: Atrial Fibrillation Ablation;  Surgeon: Constance Haw, MD;  Location: [place] [facility];  Service: Cardiovascular;  Laterality: N/A;  . ATRIAL FIBRILLATION ABLATION N/A 03/18/2019   Procedure: ATRIAL FIBRILLATION ABLATION;  Surgeon: Constance Haw, MD;  Location: [place] [facility];  Service: Cardiovascular;  Laterality: N/A;  . CARDIOVERSION  05/03/2012   Procedure:  CARDIOVERSION;  Surgeon: Laverda Page, MD;  Location: [facility] ENDOSCOPY;  Service: Cardiovascular;  Laterality: N/A;  . CARDIOVERSION  06/08/2012   Procedure: CARDIOVERSION;  Surgeon: Laverda Page, MD;  Location: [place];  Service: Cardiovascular;  Laterality: N/A;  [name]  . CARDIOVERSION N/A 10/28/2013   Procedure: CARDIOVERSION;  Surgeon: Laverda Page, MD;  Location: [place];  Service: Cardiovascular;  Laterality: N/A;  . CARDIOVERSION N/A 06/30/2014   Procedure: CARDIOVERSION;  Surgeon: Laverda Page, MD;  Location: [place];  Service: Cardiovascular;  Laterality: N/A;  H&P in file  . CARDIOVERSION N/A 09/05/2014   Procedure: CARDIOVERSION;  Surgeon: Adrian Prows, MD;  Location: [place];  Service: Cardiovascular;  Laterality: N/A;  . CARDIOVERSION N/A 12/24/2015   Procedure: CARDIOVERSION;  Surgeon: Adrian Prows, MD;  Location: [place];  Service: Cardiovascular;  Laterality: N/A;  . CARDIOVERSION N/A 01/30/2016   Procedure: CARDIOVERSION;  Surgeon: Adrian Prows, MD;  Location: [facility] ENDOSCOPY;  Service: Cardiovascular;  Laterality: N/A;  . CARDIOVERSION N/A 02/08/2016   Procedure: CARDIOVERSION;  Surgeon: Adrian Prows, MD;  Location: [facility] ENDOSCOPY;  Service: Cardiovascular;  Laterality: N/A;  . CARDIOVERSION N/A 02/08/2019   Procedure: CARDIOVERSION;  Surgeon: Adrian Prows, MD;  Location: [facility] ENDOSCOPY;  Service: Cardiovascular;  Laterality: N/A;  . CARDIOVERSION N/A 05/24/2019   Procedure: CARDIOVERSION;  Surgeon: Elouise Munroe, MD;  Location: [place];  Service: Cardiovascular;  Laterality: N/A;  . CATARACT EXTRACTION W/ INTRAOCULAR LENS  IMPLANT, BILATERAL Bilateral   . COLONOSCOPY    . INGUINAL HERNIA REPAIR Bilateral  as child  . KNEE ARTHROSCOPY Right 2004    Social History   Socioeconomic History  . Marital status: Married    Spouse name: Not on file  . Number of children: 2  . Years of education: Not on file  . Highest education level: Not on  file  Occupational History    Employer: [employer]  Tobacco Use  . Smoking status: Never Smoker  . Smokeless tobacco: Never Used  Substance and Sexual Activity  . Alcohol use: Yes    Alcohol/week: 1.0 standard drinks    Types: 1 Glasses of wine per week    Comment: daily  . Drug use: No  . Sexual activity: Not Currently  Other Topics Concern  . Not on file  Social History Narrative  . Not on file   Social Determinants of Health   Financial Resource Strain:   . Difficulty of Paying Living Expenses: Not on file  Food Insecurity:   . Worried About Charity fundraiser in the Last Year: Not on file  . Ran Out of Food in the Last Year: Not on file  Transportation Needs:   . Lack of Transportation (Medical): Not on file  . Lack of Transportation (Non-Medical): Not on file  Physical Activity:   . Days of Exercise per Week: Not on file  . Minutes of Exercise per Session: Not on file  Stress:   . Feeling of Stress : Not on file  Social Connections:   . Frequency of Communication with Friends and Family: Not on file  . Frequency of Social Gatherings with Friends and Family: Not on file  . Attends Religious Services: Not on file  . Active Member of Clubs or Organizations: Not on file  . Attends Archivist Meetings: Not on file  . Marital Status: Not on file  Intimate Partner Violence:   . Fear of Current or Ex-Partner: Not on file  . Emotionally Abused: Not on file  . Physically Abused: Not on file  . Sexually Abused: Not on file   Review of Systems  Constitution: Positive for malaise/fatigue (mild). Negative for chills, decreased appetite and weight gain.  Cardiovascular: Negative for dyspnea on exertion, leg swelling and syncope.  Respiratory: Positive for shortness of breath (mild).   Endocrine: Negative for cold intolerance.  Hematologic/Lymphatic: Does not bruise/bleed easily.  Musculoskeletal: Negative for joint swelling.  Gastrointestinal: Negative for  abdominal pain, anorexia, change in bowel habit, hematochezia and melena.  Neurological: Negative for headaches and light-headedness.  Psychiatric/Behavioral: Negative for depression and substance abuse.  All other systems reviewed and are negative.  Objective   Vitals with BMI 07/01/2019 05/31/2019 05/24/2019  Height 5' 10.5" 5' 10.5" -  Weight 193 lbs 194 lbs 3 oz -  BMI 16.10 96.04 -  Systolic 540 981 191  Diastolic 79 76 74  Pulse 70 51 63     Physical Exam  Constitutional: He appears well-developed and well-nourished. No distress.  HENT:  Head: Atraumatic.  Eyes: Conjunctivae are normal.  Neck: No JVD present. No thyromegaly present.  Cardiovascular: Normal rate, regular rhythm, S1 normal, S2 normal, intact distal pulses and normal pulses. Exam reveals no gallop.  Murmur heard.  Midsystolic murmur is present with a grade of 2/6 at the apex.  Early diastolic murmur is present with a grade of 2/6 at the upper left sternal border. No carotid bruit, no leg edema.   Pulmonary/Chest: Effort normal and breath sounds normal.  Abdominal: Soft. Bowel sounds are normal.  Musculoskeletal:        General: No edema. Normal range of motion.     Cervical back: Neck supple.  Neurological: He is alert.  Skin: Skin is warm and dry.  Psychiatric: He has a normal mood and affect.   Radiology: No results found.  Laboratory examination:    CMP Latest Ref Rng & Units 05/17/2019 04/01/2019 03/14/2019  Glucose 70 - 99 mg/dL 147(H) 92 96  BUN 8 - 23 mg/dL 22 25 30(H)  Creatinine 0.61 - 1.24 mg/dL 1.69(H) 1.48(H) 1.78(H)  Sodium 135 - 145 mmol/L 139 144 139  Potassium 3.5 - 5.1 mmol/L 4.3 4.4 4.3  Chloride 98 - 111 mmol/L 109 106 108  CO2 22 - 32 mmol/L 21(L) 24 23  Calcium 8.9 - 10.3 mg/dL 9.2 9.4 9.0  Total Protein 6.0 - 8.5 g/dL - - -  Total Bilirubin 0.0 - 1.2 mg/dL - - -  Alkaline Phos 39 - 117 IU/L - - -  AST 0 - 40 IU/L - - -  ALT 0 - 44 IU/L - - -   CBC Latest Ref Rng & Units  05/17/2019 02/22/2019 12/29/2018  WBC 4.0 - 10.5 K/uL 4.6 4.9 5.1  Hemoglobin 13.0 - 17.0 g/dL 14.9 15.0 14.6  Hematocrit 39.0 - 52.0 % 44.8 45.5 42.9  Platelets 150 - 400 K/uL 183 219 191   Lipid Panel     Component Value Date/Time   CHOL 169 12/29/2018 0816   TRIG 83 12/29/2018 0816   HDL 49 12/29/2018 0816   LDLCALC 103 (H) 12/29/2018 0816   HEMOGLOBIN A1C No results found for: HGBA1C, MPG TSH No results for input(s): TSH in the last 8760 hours.   06/18/2018: TSH 1.69.  Normal H and H, MCV 98, MCH 33, CBC otherwise normal.  Creatinine 1.82, EGFR 35/41, potassium 5.2, CMP otherwise normal.  09/28/2017: Creatinine 1.84, EGFR 35/41, potassium 5.6, CMP otherwise normal.  Normal H&H, MCV 99, CBC otherwise normal.  05/23/2015: Total cholesterol 208, triglycerides 113, HDL 53, LDL 132, creatinine 1.53, eGFR 44, potassium 4.6, CMP otherwise normal, CBC normal, PSA elevated at 5.3  Medications   Current Outpatient Medications  Medication Instructions  . acetaminophen (TYLENOL) 1,000 mg, Oral, Every 6 hours PRN  . apixaban (ELIQUIS) 5 mg, Oral, 2 times daily  . diltiazem (CARDIZEM) 30 MG tablet Take one tablet every 4 hours as needed for HR over 100  . lisinopril (ZESTRIL) 5 mg, Oral, Daily  . Multaq 400 mg, Oral, 2 times daily with meals  . psyllium (METAMUCIL) 58.6 % packet 1 packet, Oral, Daily PRN  . 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 Trifascicular block. Normal BP response. There was no ST-T changes of ischemia with exercise stress test. No stress induced arrhythmias. Stress terminated due to THR (>85% MPHR)/MPHR met. The patient exercised according to Bruce Protocol, Total time recorded 06:50 min achieving max heart rate of 158 which was 106% of MPHR for age and 8.34 METS of work.  Echocardiogram [02/26/2016]: 1. Left ventricle  cavity is normal in size. Mild concentric hypertrophy of the left ventricle. Normal global wall motion. Calculated EF 68%. 2. Left atrial cavity is moderately dilated. 3. Mild to moderate mitral regurgitation. 4. Mild tricuspid regurgitation. No evidence of pulmonary hypertension. 5. Mild to moderate pulmonic regurgitation. 6. c.f. echo. of 07/19/2013, RV, RA sizes appear normal, TR is  less severe, and, there is no pulmonary hypertension. MR appears slightly less severe.  Coronary CTA 03/06/2019: 1. Coronary artery calcium score 1837 Agatston units. This places the patient in the 86th percentile for age and gender, suggesting high risk for future cardiac events. 2. Extensive calcified plaque throughout the coronary system. Mild stenosis for the most part, possibly around 50% stenosis in the distal RCA and the proximal LCx (small vessel). However, difficult to quantify given extensive calcification with possibility of blooming artifact. Will send for FFR. 3.  Pulmonary veins without stenosis.  4.  No LA appendage thrombus seen. FFR 0.9 mid LAD, FFR 0.93 distal RCA, FFR 0.94 mid LCx IMPRESSION: No evidence for hemodynamically significant stenosis.  Assessment     ICD-10-CM   1. Paroxysmal atrial fibrillation (HCC) CHA2DS2-VASc Score is 4.  Yearly risk of stroke: 4% (A, HTN, CAD)    I48.0 EKG 12-Lead  2. Mild hypercholesterolemia  E78.00 Lipid Panel With LDL/HDL Ratio  3. Essential hypertension  I10   4. Stage 3b chronic kidney disease  N18.32   5. Benign prostatic hyperplasia with urinary hesitancy  N40.1 PSA   R39.11     EKG 07/01/2019: Sinus rhythm with first-degree AV block at rate of 64 bpm, normal axis.  No evidence of ischemia.  Low-voltage complexes.  Normal QT interval. No significant change from 03/31/2019.   EKG 01/31/2019: Atrial fibrillation with controlled ventricular response at the rate of 86 bpm, left axis deviation, left anterior fascicular block.  Poor R-wave progression,  cannot exclude anteroseptal infarct old.  Pulmonary disease pattern with low voltage, no evidence of ischemia.   Recommendation:    Gregory [name]  is a 79 y.o. male Caucasian male with history of stage 3b CKD, hypertension, mild hyperlipidemia and moderate CAD by coronary CTA, paroxysmal atrial fibrillation. He has history of multiple cardioversions and eventually dofetilide was discontinued by Dr. [name] due to efficacy, and was on Multaq and has maintained sinus since Dec 2018. Due to recurrence of atrial fibrillation, he underwent 2nd ablation on 02/22/2019, 1st ablation being on 11/28/2016.  He had repeat cardioversion on 05/24/2019.   He is here on a three-month office visit and follow-up of hypertension, hyperlipidemia and also renal insufficiency along with atrial fibrillation, he is maintaining sinus rhythm on Multaq.  He has not had his lipid profile, will obtain labs, he also complains of the urinary stream and hesitancy, request PSA which is ordered.  He'll follow-up with Dr. Karsten Ro for the same.  Renal function is normal, her pressure is well controlled, I'll see him back in 6 months.  Adrian Prows, MD, [facility] 07/01/2019, 8:57 AM [facility]

## 2019-07-05 LAB — LIPID PANEL WITH LDL/HDL RATIO
Cholesterol, Total: 107 mg/dL (ref 100–199)
HDL: 50 mg/dL (ref >39)
LDL Chol Calc (NIH): 43 mg/dL (ref 0–99)
LDL/HDL Ratio: 0.9 ratio (ref 0.0–3.6)
Triglycerides: 64 mg/dL (ref 0–149)
VLDL Cholesterol Cal: 14 mg/dL (ref 5–40)

## 2019-07-05 LAB — PSA: Prostate Specific Ag, Serum: 4.9 ng/mL — ABNORMAL HIGH (ref 0.0–4.0)

## 2019-07-13 ENCOUNTER — Other Ambulatory Visit: Payer: Self-pay

## 2019-07-13 MED ORDER — MULTAQ 400 MG PO TABS: 400.0000 mg | ORAL_TABLET | Freq: Two times a day (BID) | ORAL | 1 refills | Status: DC

## 2019-07-15 ENCOUNTER — Ambulatory Visit: Payer: Medicare Other

## 2019-07-16 ENCOUNTER — Ambulatory Visit: Payer: Medicare Other | Attending: Internal Medicine

## 2019-07-16 DIAGNOSIS — Z23 Encounter for immunization: Secondary | ICD-10-CM

## 2019-07-16 NOTE — Progress Notes (Signed)
   Covid-19 Vaccination Clinic  Name:  CARTIER WASHKO    MRN: [id number] DOB: 1940/08/12  07/16/2019  Mr. Meir was observed post Covid-19 immunization for 15 minutes without incidence. He was provided with Vaccine Information Sheet and instruction to access the V-Safe system.   Mr. Sweetland was instructed to call 911 with any severe reactions post vaccine: Marland Kitchen Difficulty breathing  . Swelling of your face and throat  . A fast heartbeat  . A bad rash all over your body  . Dizziness and weakness    Immunizations Administered    Name Date Dose VIS Date Route   Pfizer COVID-19 Vaccine 07/16/2019  9:26 AM 0.3 mL 05/27/2019 Intramuscular   Manufacturer: [employer], [employer]   Lot: [id number]   NDC: [id number]

## 2019-07-28 ENCOUNTER — Ambulatory Visit: Payer: Medicare Other

## 2019-07-31 ENCOUNTER — Ambulatory Visit: Payer: Medicare Other

## 2019-08-24 ENCOUNTER — Other Ambulatory Visit: Payer: Self-pay

## 2019-08-24 DIAGNOSIS — N1832 Chronic kidney disease, stage 3b: Secondary | ICD-10-CM

## 2019-08-24 MED ORDER — LISINOPRIL 5 MG PO TABS: 5.0000 mg | ORAL_TABLET | Freq: Every day | ORAL | 1 refills | Status: DC

## 2019-09-16 ENCOUNTER — Ambulatory Visit (INDEPENDENT_AMBULATORY_CARE_PROVIDER_SITE_OTHER): Payer: Medicare Other | Admitting: Cardiology

## 2019-09-16 ENCOUNTER — Encounter: Payer: Self-pay | Admitting: Cardiology

## 2019-09-16 ENCOUNTER — Other Ambulatory Visit: Payer: Self-pay

## 2019-09-16 VITALS — BP 136/78 | HR 60 | Ht 70.5 in | Wt 193.0 lb

## 2019-09-16 DIAGNOSIS — I480 Paroxysmal atrial fibrillation: Secondary | ICD-10-CM | POA: Diagnosis not present

## 2019-09-16 DIAGNOSIS — I4819 Other persistent atrial fibrillation: Secondary | ICD-10-CM

## 2019-09-16 NOTE — Progress Notes (Signed)
Electrophysiology Office Note   Date:  09/16/2019   ID:  Gregory [name], DOB 1941-06-07, MRN [id number]  PCP:  Christain Sacramento, MD  Cardiologist:  Einar Gip Primary Electrophysiologist:  Constance Haw, MD    No chief complaint on file.    History of Present Illness: Gregory [name] is a 79 y.o. male who presents today for electrophysiology evaluation.   He has history of atrial fibrillation, hypertension. He has had multiple cardioversions and has since been put on dofetilide. He was having breakthrough episodes of atrial fibrillation and had ablation on 11/28/16.  He was switched from dofetilide to amiodarone.  Despite this he continued to have episodes of atrial fibrillation and is now status post repeat AF ablation 03/18/2019.  Today, denies symptoms of palpitations, chest pain, shortness of breath, orthopnea, PND, lower extremity edema, claudication, dizziness, presyncope, syncope, bleeding, or neurologic sequela. The patient is tolerating medications without difficulties.  Overall he is doing well.  He has no chest pain or shortness of breath.  He is able to do all of his daily activities.  He notes a few skipped beats, but has PVCs on his ECG.  Otherwise he has no major complaints.  Past Medical History:  Diagnosis Date  . Arthritis    "right knee" (01/28/2016)  . Diverticulosis of colon (without mention of hemorrhage)   . Dysrhythmia   . Internal hemorrhoids without mention of complication   . Personal history of colonic polyps 01/17/2002   adenomatous, tublar adenoma 02/10/2007  . PNA (pneumonia) 03/31/2012   Past Surgical History:  Procedure Laterality Date  . ATRIAL FIBRILLATION ABLATION  11/28/2016  . ATRIAL FIBRILLATION ABLATION N/A 11/28/2016   Procedure: Atrial Fibrillation Ablation;  Surgeon: Constance Haw, MD;  Location: [place] [facility];  Service: Cardiovascular;  Laterality: N/A;  . ATRIAL FIBRILLATION ABLATION N/A 03/18/2019   Procedure: ATRIAL  FIBRILLATION ABLATION;  Surgeon: Constance Haw, MD;  Location: [place] [facility];  Service: Cardiovascular;  Laterality: N/A;  . CARDIOVERSION  05/03/2012   Procedure: CARDIOVERSION;  Surgeon: Laverda Page, MD;  Location: [facility] ENDOSCOPY;  Service: Cardiovascular;  Laterality: N/A;  . CARDIOVERSION  06/08/2012   Procedure: CARDIOVERSION;  Surgeon: Laverda Page, MD;  Location: [place];  Service: Cardiovascular;  Laterality: N/A;  [name]  . CARDIOVERSION N/A 10/28/2013   Procedure: CARDIOVERSION;  Surgeon: Laverda Page, MD;  Location: [place];  Service: Cardiovascular;  Laterality: N/A;  . CARDIOVERSION N/A 06/30/2014   Procedure: CARDIOVERSION;  Surgeon: Laverda Page, MD;  Location: [place];  Service: Cardiovascular;  Laterality: N/A;  H&P in file  . CARDIOVERSION N/A 09/05/2014   Procedure: CARDIOVERSION;  Surgeon: Adrian Prows, MD;  Location: [place];  Service: Cardiovascular;  Laterality: N/A;  . CARDIOVERSION N/A 12/24/2015   Procedure: CARDIOVERSION;  Surgeon: Adrian Prows, MD;  Location: [place];  Service: Cardiovascular;  Laterality: N/A;  . CARDIOVERSION N/A 01/30/2016   Procedure: CARDIOVERSION;  Surgeon: Adrian Prows, MD;  Location: [facility] ENDOSCOPY;  Service: Cardiovascular;  Laterality: N/A;  . CARDIOVERSION N/A 02/08/2016   Procedure: CARDIOVERSION;  Surgeon: Adrian Prows, MD;  Location: [facility] ENDOSCOPY;  Service: Cardiovascular;  Laterality: N/A;  . CARDIOVERSION N/A 02/08/2019   Procedure: CARDIOVERSION;  Surgeon: Adrian Prows, MD;  Location: [facility] ENDOSCOPY;  Service: Cardiovascular;  Laterality: N/A;  . CARDIOVERSION N/A 05/24/2019   Procedure: CARDIOVERSION;  Surgeon: Elouise Munroe, MD;  Location: [place];  Service: Cardiovascular;  Laterality: N/A;  . CATARACT EXTRACTION  W/ INTRAOCULAR LENS  IMPLANT, BILATERAL Bilateral   . COLONOSCOPY    . INGUINAL HERNIA REPAIR Bilateral    as child  . KNEE ARTHROSCOPY Right 2004     Current  Outpatient Medications  Medication Sig Dispense Refill  . acetaminophen (TYLENOL) 500 MG tablet Take 1,000 mg by mouth every 6 (six) hours as needed for moderate pain or headache.    Marland Kitchen apixaban (ELIQUIS) 5 MG TABS tablet Take 1 tablet (5 mg total) by mouth 2 (two) times daily. 180 tablet 3  . dronedarone (MULTAQ) 400 MG tablet Take 1 tablet (400 mg total) by mouth 2 (two) times daily with a meal. 180 tablet 1  . lisinopril (ZESTRIL) 5 MG tablet Take 1 tablet (5 mg total) by mouth daily. 90 tablet 1  . psyllium (METAMUCIL) 58.6 % packet Take 1 packet by mouth daily as needed (constipation).     . rosuvastatin (CRESTOR) 5 MG tablet Take 1 tablet (5 mg total) by mouth daily. 90 tablet 3   No current facility-administered medications for this visit.    Allergies:   Patient has no known allergies.   Social History:  The patient  reports that he has never smoked. He has never used smokeless tobacco. He reports current alcohol use of about 1.0 standard drinks of alcohol per week. He reports that he does not use drugs.   Family History:  The patient's family history includes Cancer in his mother; Heart disease in his father; Stroke in his father.   ROS:  Please see the history of present illness.   Otherwise, review of systems is positive for none.   All other systems are reviewed and negative.   PHYSICAL EXAM: VS:  BP 136/78   Pulse 60   Ht 5' 10.5" (1.791 m)   Wt 193 lb (87.5 kg)   BMI 27.30 kg/m  , BMI Body mass index is 27.3 kg/m. GEN: Well nourished, well developed, in no acute distress  HEENT: normal  Neck: no JVD, carotid bruits, or masses Cardiac: RRR; no murmurs, rubs, or gallops,no edema  Respiratory:  clear to auscultation bilaterally, normal work of breathing GI: soft, nontender, nondistended, + BS MS: no deformity or atrophy  Skin: warm and dry Neuro:  Strength and sensation are intact Psych: euthymic mood, full affect  EKG:  EKG is ordered today. Personal review of the  ekg ordered shows sinus rhythm, rate 60, PVC  Recent Labs: 12/29/2018: ALT 14 02/03/2019: Magnesium 2.4 05/17/2019: BUN 22; Creatinine, Ser 1.69; Hemoglobin 14.9; Platelets 183; Potassium 4.3; Sodium 139    Lipid Panel     Component Value Date/Time   CHOL 107 07/04/2019 0843   TRIG 64 07/04/2019 0843   HDL 50 07/04/2019 0843   LDLCALC 43 07/04/2019 0843     Wt Readings from Last 3 Encounters:  09/16/19 193 lb (87.5 kg)  07/01/19 193 lb (87.5 kg)  05/31/19 194 lb 3.2 oz (88.1 kg)      Other studies Reviewed: Additional studies/ records that were reviewed today include: TTE 03/02/16  Review of the above records today demonstrates:  Mild concentric LVH. EF 68%. Moderately dilated left atrium. Mild tricuspid regurgitation without evidence of pulmonary hypertension.   ASSESSMENT AND PLAN:  1.  Persistent atrial fibrillation: This.  CHA2DS2-VASc of 3.  He is now status post repeat AF ablation 03/18/2019.  He fortunately remains in sinus rhythm.  Currently on Multaq and Eliquis.  No changes at this time.  2. Hypertension: Currently well controlled.  No changes.    Current medicines are reviewed at length with the patient today.   The patient does not have concerns regarding his medicines.  The following changes were made today: None  Labs/ tests ordered today include:  Orders Placed This Encounter  Procedures  . EKG 12-Lead    Disposition:   FU with Philipp Callegari 6 months  Signed, Emmalia Heyboer Jorja Loa, MD  09/16/2019 10:47 AM     [facility] [address] [place] [address] [phone] (office) [phone] (fax)

## 2019-09-16 NOTE — Patient Instructions (Signed)
Medication Instructions:  Your physician recommends that you continue on your current medications as directed. Please refer to the Current Medication list given to you today.  *If you need a refill on your cardiac medications before your next appointment, please call your pharmacy*   Lab Work: None ordered If you have labs (blood work) drawn today and your tests are completely normal, you will receive your results only by: . MyChart Message (if you have MyChart) OR . A paper copy in the mail If you have any lab test that is abnormal or we need to change your treatment, we will call you to review the results.   Testing/Procedures: None ordered   Follow-Up: At [facility], you and your health needs are our priority.  As part of our continuing mission to provide you with exceptional heart care, we have created designated Provider Care Teams.  These Care Teams include your primary Cardiologist (physician) and Advanced Practice Providers (APPs -  Physician Assistants and Nurse Practitioners) who all work together to provide you with the care you need, when you need it.  We recommend signing up for the patient portal called "MyChart".  Sign up information is provided on this After Visit Summary.  MyChart is used to connect with patients for Virtual Visits (Telemedicine).  Patients are able to view lab/test results, encounter notes, upcoming appointments, etc.  Non-urgent messages can be sent to your provider as well.   To learn more about what you can do with MyChart, go to [email or web address].    Your next appointment:   6 month(s)  The format for your next appointment:   In Person  Provider:   Will Camnitz, MD   Thank you for choosing [facility]!!   Allyson Tineo, RN [phone]    Other Instructions    

## 2019-09-19 NOTE — Telephone Encounter (Signed)
Needs OV with EKG

## 2019-09-20 ENCOUNTER — Telehealth: Payer: Self-pay

## 2019-09-20 ENCOUNTER — Ambulatory Visit: Payer: Medicare Other | Admitting: Cardiology

## 2019-09-20 ENCOUNTER — Other Ambulatory Visit: Payer: Self-pay

## 2019-09-20 ENCOUNTER — Encounter: Payer: Self-pay | Admitting: Cardiology

## 2019-09-20 VITALS — BP 124/79 | HR 66 | Temp 98.4°F | Ht 70.5 in | Wt 192.0 lb

## 2019-09-20 DIAGNOSIS — R0602 Shortness of breath: Secondary | ICD-10-CM

## 2019-09-20 DIAGNOSIS — I480 Paroxysmal atrial fibrillation: Secondary | ICD-10-CM

## 2019-09-20 DIAGNOSIS — N1832 Chronic kidney disease, stage 3b: Secondary | ICD-10-CM

## 2019-09-20 NOTE — Telephone Encounter (Signed)
Need to do DCCV in a couple days not next month.

## 2019-09-20 NOTE — Telephone Encounter (Signed)
Pt called to inform us that he went on Afib Sat and is still on Afib. Pt would like to know what medications should he take and what should he do. Please advise

## 2019-09-20 NOTE — Telephone Encounter (Signed)
Called pt to inform of Dr. Elberta Fortis recommendation to follow up in AFib clinic. He explained that he saw Dr. Jacinto Halim today and DCCV scheduled for 5/4. Pt would like to have done prior to his vacation 4/20 - 4/24, if possible.  Aware I will look to see if our office can arrange for sooner procedure date. Aware that I will forward this to Dr. Jacinto Halim for approval of moving DCCV to sooner date through our office. Pt aware I will call him tomorrow

## 2019-09-20 NOTE — Patient Instructions (Signed)
cardio

## 2019-09-20 NOTE — Progress Notes (Signed)
Primary Physician/Referring:  Christain Sacramento, MD  Patient ID: Gregory [name], male    DOB: Dec 15, 1940, 79 y.o.   MRN: [id number]  Subjective   Chief Complaint  Patient presents with  . Atrial Fibrillation    [name]  is a 79 y.o. male   Caucasian male with history of stage 3b CKD, hypertension, mild hyperlipidemia and moderate CAD by coronary CTA, paroxysmal atrial fibrillation. He has history of multiple cardioversions and eventually dofetilide was discontinued by Dr. [name] due to efficacy, and was placed on Multaq and has maintained sinus since Dec 2018. Due to recurrence of atrial fibrillation, he underwent 2nd ablation on 02/22/2019, 1st ablation being on 11/28/2016.  He had repeat cardioversion on 05/24/2019.   He called our office stating that for the past 3 days he has been in atrial fibrillation, has started taking diltiazem on a daily basis but has not helped with cardioversion and also fatigue and dyspnea on exertion. He is tolerating anticoagulation with Eliquis, No GI bleed, no dark stools.  Past Medical History:  Diagnosis Date  . Arthritis    "right knee" (01/28/2016)  . Diverticulosis of colon (without mention of hemorrhage)   . Dysrhythmia   . Internal hemorrhoids without mention of complication   . Personal history of colonic polyps 01/17/2002   adenomatous, tublar adenoma 02/10/2007  . PNA (pneumonia) 03/31/2012    Past Surgical History:  Procedure Laterality Date  . ATRIAL FIBRILLATION ABLATION  11/28/2016  . ATRIAL FIBRILLATION ABLATION N/A 11/28/2016   Procedure: Atrial Fibrillation Ablation;  Surgeon: Constance Haw, MD;  Location: [place] [facility];  Service: Cardiovascular;  Laterality: N/A;  . ATRIAL FIBRILLATION ABLATION N/A 03/18/2019   Procedure: ATRIAL FIBRILLATION ABLATION;  Surgeon: Constance Haw, MD;  Location: [place] [facility];  Service: Cardiovascular;  Laterality: N/A;  . CARDIOVERSION  05/03/2012   Procedure:  CARDIOVERSION;  Surgeon: Laverda Page, MD;  Location: [facility] ENDOSCOPY;  Service: Cardiovascular;  Laterality: N/A;  . CARDIOVERSION  06/08/2012   Procedure: CARDIOVERSION;  Surgeon: Laverda Page, MD;  Location: [place];  Service: Cardiovascular;  Laterality: N/A;  Gregory [name] /janie  . CARDIOVERSION N/A 10/28/2013   Procedure: CARDIOVERSION;  Surgeon: Laverda Page, MD;  Location: [place];  Service: Cardiovascular;  Laterality: N/A;  . CARDIOVERSION N/A 06/30/2014   Procedure: CARDIOVERSION;  Surgeon: Laverda Page, MD;  Location: [place];  Service: Cardiovascular;  Laterality: N/A;  H&P in file  . CARDIOVERSION N/A 09/05/2014   Procedure: CARDIOVERSION;  Surgeon: Adrian Prows, MD;  Location: [place];  Service: Cardiovascular;  Laterality: N/A;  . CARDIOVERSION N/A 12/24/2015   Procedure: CARDIOVERSION;  Surgeon: Adrian Prows, MD;  Location: [place];  Service: Cardiovascular;  Laterality: N/A;  . CARDIOVERSION N/A 01/30/2016   Procedure: CARDIOVERSION;  Surgeon: Adrian Prows, MD;  Location: [facility] ENDOSCOPY;  Service: Cardiovascular;  Laterality: N/A;  . CARDIOVERSION N/A 02/08/2016   Procedure: CARDIOVERSION;  Surgeon: Adrian Prows, MD;  Location: [facility] ENDOSCOPY;  Service: Cardiovascular;  Laterality: N/A;  . CARDIOVERSION N/A 02/08/2019   Procedure: CARDIOVERSION;  Surgeon: Adrian Prows, MD;  Location: [facility] ENDOSCOPY;  Service: Cardiovascular;  Laterality: N/A;  . CARDIOVERSION N/A 05/24/2019   Procedure: CARDIOVERSION;  Surgeon: Elouise Munroe, MD;  Location: [place];  Service: Cardiovascular;  Laterality: N/A;  . CATARACT EXTRACTION W/ INTRAOCULAR LENS  IMPLANT, BILATERAL Bilateral   . COLONOSCOPY    . INGUINAL HERNIA REPAIR Bilateral    as child  .  KNEE ARTHROSCOPY Right 2004   Social History   Tobacco Use  . Smoking status: Never Smoker  . Smokeless tobacco: Never Used  Substance Use Topics  . Alcohol use: Yes    Alcohol/week: 1.0 standard drinks    Types: 1  Glasses of wine per week    Comment: daily   Marital status: Married   Review of Systems  Constitution: Positive for malaise/fatigue.  Cardiovascular: Positive for dyspnea on exertion. Negative for chest pain and leg swelling.  Gastrointestinal: Negative for melena.   Objective   Vitals with BMI 09/20/2019 09/16/2019 07/01/2019  Height 5' 10.5" 5' 10.5" 5' 10.5"  Weight 192 lbs 193 lbs 193 lbs  BMI 27.15 99.37 16.96  Systolic 789 381 017  Diastolic 79 78 79  Pulse 66 60 70    Physical Exam  Constitutional: He appears well-developed and well-nourished. No distress.  HENT:  Head: Atraumatic.  Eyes: Conjunctivae are normal.  Neck: No JVD present. No thyromegaly present.  Cardiovascular: Normal rate, intact distal pulses and normal pulses. An irregularly irregular rhythm present. Exam reveals no gallop.  Murmur heard.  Midsystolic murmur is present with a grade of 2/6 at the apex.  Early diastolic murmur is present with a grade of 2/6 at the upper left sternal border. S1 variable and S2 is normal.  No carotid bruit, no leg edema.   Pulmonary/Chest: Effort normal and breath sounds normal.  Abdominal: Soft. Bowel sounds are normal.  Musculoskeletal:        General: No edema. Normal range of motion.     Cervical back: Neck supple.  Neurological: He is alert.  Skin: Skin is warm and dry.  Psychiatric: He has a normal mood and affect.   Radiology: No results found.  Laboratory examination:    CMP Latest Ref Rng & Units 05/17/2019 04/01/2019 03/14/2019  Glucose 70 - 99 mg/dL 147(H) 92 96  BUN 8 - 23 mg/dL 22 25 30(H)  Creatinine 0.61 - 1.24 mg/dL 1.69(H) 1.48(H) 1.78(H)  Sodium 135 - 145 mmol/L 139 144 139  Potassium 3.5 - 5.1 mmol/L 4.3 4.4 4.3  Chloride 98 - 111 mmol/L 109 106 108  CO2 22 - 32 mmol/L 21(L) 24 23  Calcium 8.9 - 10.3 mg/dL 9.2 9.4 9.0  Total Protein 6.0 - 8.5 g/dL - - -  Total Bilirubin 0.0 - 1.2 mg/dL - - -  Alkaline Phos 39 - 117 IU/L - - -  AST 0 - 40 IU/L  - - -  ALT 0 - 44 IU/L - - -   CBC Latest Ref Rng & Units 05/17/2019 02/22/2019 12/29/2018  WBC 4.0 - 10.5 K/uL 4.6 4.9 5.1  Hemoglobin 13.0 - 17.0 g/dL 14.9 15.0 14.6  Hematocrit 39.0 - 52.0 % 44.8 45.5 42.9  Platelets 150 - 400 K/uL 183 219 191   Lipid Panel     Component Value Date/Time   CHOL 107 07/04/2019 0843   TRIG 64 07/04/2019 0843   HDL 50 07/04/2019 0843   LDLCALC 43 07/04/2019 0843   HEMOGLOBIN A1C No results found for: HGBA1C, MPG TSH No results for input(s): TSH in the last 8760 hours.   06/18/2018: TSH 1.69.  Normal H and H, MCV 98, MCH 33, CBC otherwise normal.  Creatinine 1.82, EGFR 35/41, potassium 5.2, CMP otherwise normal.  09/28/2017: Creatinine 1.84, EGFR 35/41, potassium 5.6, CMP otherwise normal.  Normal H&H, MCV 99, CBC otherwise normal.  05/23/2015: Total cholesterol 208, triglycerides 113, HDL 53, LDL 132, creatinine 1.53, eGFR  44, potassium 4.6, CMP otherwise normal, CBC normal, PSA elevated at 5.3  Medications   Current Outpatient Medications  Medication Instructions  . acetaminophen (TYLENOL) 1,000 mg, Oral, Every 6 hours PRN  . apixaban (ELIQUIS) 5 mg, Oral, 2 times daily  . diltiazem (TIAZAC) 120 mg, Oral, Daily PRN, For A. Fib   . lisinopril (ZESTRIL) 5 mg, Oral, Daily  . Multaq 400 mg, Oral, 2 times daily with meals  . psyllium (METAMUCIL) 58.6 % packet 1 packet, Oral, Daily PRN  . 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 Trifascicular block. Normal BP response. There was no ST-T changes of ischemia with exercise stress test. No stress induced arrhythmias. Stress terminated due to THR (>85% MPHR)/MPHR met. The patient exercised according to Bruce Protocol, Total time recorded 06:50 min achieving max heart rate of 158 which was 106% of MPHR for age and 8.34 METS of work.  Echocardiogram  [02/26/2016]: 1. Left ventricle cavity is normal in size. Mild concentric hypertrophy of the left ventricle. Normal global wall motion. Calculated EF 68%. 2. Left atrial cavity is moderately dilated. 3. Mild to moderate mitral regurgitation. 4. Mild tricuspid regurgitation. No evidence of pulmonary hypertension. 5. Mild to moderate pulmonic regurgitation. 6. c.f. echo. of 07/19/2013, RV, RA sizes appear normal, TR is less severe, and, there is no pulmonary hypertension. MR appears slightly less severe.  Coronary CTA 03/06/2019: 1. Coronary artery calcium score 1837 Agatston units. This places the patient in the 86th percentile for age and gender, suggesting high risk for future cardiac events. 2. Extensive calcified plaque throughout the coronary system. Mild stenosis for the most part, possibly around 50% stenosis in the distal RCA and the proximal LCx (small vessel). However, difficult to quantify given extensive calcification with possibility of blooming artifact. Will send for FFR. 3.  Pulmonary veins without stenosis.  4.  No LA appendage thrombus seen.  FFR 0.9 mid LAD, FFR 0.93 distal RCA, FFR 0.94 mid LCx: No evidence for hemodynamically significant stenosis.  EKG:  EKG 04/0/2021: Atrial fibrillation with rapid ventricular response at the rate of 106 bpm, left axis deviation.  Low-voltage complexes.  Nonspecific T abnormality.  Single PVC.   EKG 07/01/2019: Sinus rhythm with first-degree AV block at rate of 64 bpm, normal axis.  No evidence of ischemia.  Low-voltage complexes.  Normal QT interval. No significant change from 03/31/2019.  Assessment     ICD-10-CM   1. Paroxysmal atrial fibrillation (HCC) CHA2DS2-VASc Score is 4.  Yearly risk of stroke: 4% (A, HTN, CAD)    I48.0 EKG 12-Lead    PCV ECHOCARDIOGRAM COMPLETE    CBC  2. Shortness of breath  R06.02 PCV ECHOCARDIOGRAM COMPLETE  3. Stage 3b chronic kidney disease  R74.08 Basic metabolic panel    Recommendation:     Gregory [name]  is a 79 y.o. male Caucasian male with history of stage 3b CKD, hypertension, mild hyperlipidemia and moderate CAD by coronary CTA, paroxysmal atrial fibrillation. He has history of multiple cardioversions and eventually dofetilide was discontinued by Dr. [name] due to efficacy, and was on Multaq and has maintained sinus since Dec 2018. Due to recurrence of atrial fibrillation, he underwent 2nd ablation on 02/22/2019, 1st ablation being on 11/28/2016.  He had repeat cardioversion on 05/24/2019.   He is back in atrial fibrillation, he is symptomatic with dyspnea and swelling schedule him  for repeat direct-current cardioversion.  I will repeat an echocardiogram as it has been almost 2 years since last echocardiogram.  Obtain BMP and CBC and follow-up on chronic renal insufficiency as well.  No clinical evidence of heart failure, no leg edema, no change in heart murmur but does need follow-up on mitral and pulmonary regurgitation as well.  I will see him back in 2 to 3 weeks for follow-up.  Adrian Prows, MD, [facility] 09/20/2019, 3:21 PM [facility]

## 2019-09-20 NOTE — Telephone Encounter (Signed)
He needs to come in to get an EKG and I can see him or he is aware of me not seeing him, but we can then set up cardioversion. Please set up cardioversion for Thursday. He can come in today for EKG. Please call. I have spoken to him. JG

## 2019-09-21 ENCOUNTER — Other Ambulatory Visit: Payer: Self-pay

## 2019-09-21 ENCOUNTER — Ambulatory Visit (HOSPITAL_COMMUNITY): Payer: Medicare Other | Admitting: Certified Registered"

## 2019-09-21 ENCOUNTER — Ambulatory Visit (HOSPITAL_COMMUNITY)
Admission: RE | Admit: 2019-09-21 | Discharge: 2019-09-21 | Disposition: A | Discharge status: Home / Self Care | Payer: Medicare Other | Source: Ambulatory Visit | Attending: Cardiology | Admitting: Cardiology

## 2019-09-21 ENCOUNTER — Other Ambulatory Visit (HOSPITAL_COMMUNITY)
Admission: RE | Admit: 2019-09-21 | Discharge: 2019-09-21 | Disposition: A | Discharge status: Home / Self Care | Payer: Medicare Other | Source: Ambulatory Visit | Attending: Cardiology | Admitting: Cardiology

## 2019-09-21 ENCOUNTER — Encounter (HOSPITAL_COMMUNITY)
Admission: RE | Disposition: A | Discharge status: Home / Self Care | Payer: Self-pay | Source: Ambulatory Visit | Attending: Cardiology

## 2019-09-21 ENCOUNTER — Encounter (HOSPITAL_COMMUNITY): Payer: Self-pay | Admitting: Cardiology

## 2019-09-21 ENCOUNTER — Other Ambulatory Visit: Payer: Self-pay | Admitting: Cardiology

## 2019-09-21 DIAGNOSIS — I129 Hypertensive chronic kidney disease with stage 1 through stage 4 chronic kidney disease, or unspecified chronic kidney disease: Secondary | ICD-10-CM | POA: Diagnosis not present

## 2019-09-21 DIAGNOSIS — Z7901 Long term (current) use of anticoagulants: Secondary | ICD-10-CM | POA: Insufficient documentation

## 2019-09-21 DIAGNOSIS — I480 Paroxysmal atrial fibrillation: Secondary | ICD-10-CM

## 2019-09-21 DIAGNOSIS — Z79899 Other long term (current) drug therapy: Secondary | ICD-10-CM | POA: Insufficient documentation

## 2019-09-21 DIAGNOSIS — N1832 Chronic kidney disease, stage 3b: Secondary | ICD-10-CM | POA: Insufficient documentation

## 2019-09-21 DIAGNOSIS — E785 Hyperlipidemia, unspecified: Secondary | ICD-10-CM | POA: Insufficient documentation

## 2019-09-21 DIAGNOSIS — R0602 Shortness of breath: Secondary | ICD-10-CM | POA: Diagnosis not present

## 2019-09-21 DIAGNOSIS — I2510 Atherosclerotic heart disease of native coronary artery without angina pectoris: Secondary | ICD-10-CM | POA: Insufficient documentation

## 2019-09-21 DIAGNOSIS — Z20822 Contact with and (suspected) exposure to covid-19: Secondary | ICD-10-CM | POA: Insufficient documentation

## 2019-09-21 HISTORY — PX: CARDIOVERSION: SHX1299

## 2019-09-21 LAB — RESPIRATORY PANEL BY RT PCR (FLU A&B, COVID)
Influenza A by PCR: NEGATIVE
Influenza B by PCR: NEGATIVE
SARS Coronavirus 2 by RT PCR: NEGATIVE

## 2019-09-21 SURGERY — CARDIOVERSION
Anesthesia: General

## 2019-09-21 MED ORDER — SODIUM CHLORIDE 0.9 % IV SOLN: INTRAVENOUS | Status: DC

## 2019-09-21 MED ORDER — LIDOCAINE 2% (20 MG/ML) 5 ML SYRINGE
INTRAMUSCULAR | Status: DC | PRN
  Administered 2019-09-21: 60 mg via INTRAVENOUS

## 2019-09-21 MED ORDER — PROPOFOL 10 MG/ML IV BOLUS
INTRAVENOUS | Status: DC | PRN
  Administered 2019-09-21: 50 mg via INTRAVENOUS

## 2019-09-21 NOTE — Transfer of Care (Signed)
Immediate Anesthesia Transfer of Care Note  Patient: Gregory [name]  Procedure(s) Performed: CARDIOVERSION (N/A )  Patient Location: Endoscopy Unit  Anesthesia Type:MAC  Level of Consciousness: awake, alert  and oriented  Airway & Oxygen Therapy: Patient Spontanous Breathing and Patient connected to nasal cannula oxygen  Post-op Assessment: Report given to RN, Post -op Vital signs reviewed and stable and Patient moving all extremities  Post vital signs: Reviewed and stable  Last Vitals:  Vitals Value Taken Time  BP    Temp    Pulse    Resp    SpO2      Last Pain:  Vitals:   09/21/19 1305  TempSrc: Oral  PainSc: 0-No pain         Complications: No apparent anesthesia complications

## 2019-09-21 NOTE — Anesthesia Procedure Notes (Signed)
Procedure Name: MAC Date/Time: 09/21/2019 1:37 PM Performed by: Amadeo Garnet, CRNA Pre-anesthesia Checklist: Patient identified, Emergency Drugs available, Suction available and Patient being monitored Patient Re-evaluated:Patient Re-evaluated prior to induction Oxygen Delivery Method: Ambu bag Preoxygenation: Pre-oxygenation with 100% oxygen Induction Type: IV induction Placement Confirmation: positive ETCO2 Dental Injury: Teeth and Oropharynx as per pre-operative assessment

## 2019-09-21 NOTE — Discharge Instructions (Signed)
Electrical Cardioversion Electrical cardioversion is the delivery of a jolt of electricity to restore a normal rhythm to the heart. A rhythm that is too fast or is not regular keeps the heart from pumping well. In this procedure, sticky patches or metal paddles are placed on the chest to deliver electricity to the heart from a device. This procedure may be done in an emergency if:  There is low or no blood pressure as a result of the heart rhythm.  Normal rhythm must be restored as fast as possible to protect the brain and heart from further damage.  It may save a life. This may also be a scheduled procedure for irregular or fast heart rhythms that are not immediately life-threatening. Tell a health care provider about:  Any allergies you have.  All medicines you are taking, including vitamins, herbs, eye drops, creams, and over-the-counter medicines.  Any problems you or family members have had with anesthetic medicines.  Any blood disorders you have.  Any surgeries you have had.  Any medical conditions you have.  Whether you are pregnant or may be pregnant. What are the risks? Generally, this is a safe procedure. However, problems may occur, including:  Allergic reactions to medicines.  A blood clot that breaks free and travels to other parts of your body.  The possible return of an abnormal heart rhythm within hours or days after the procedure.  Your heart stopping (cardiac arrest). This is rare. What happens before the procedure? Medicines  Your health care provider may have you start taking: ? Blood-thinning medicines (anticoagulants) so your blood does not clot as easily. ? Medicines to help stabilize your heart rate and rhythm.  Ask your health care provider about: ? Changing or stopping your regular medicines. This is especially important if you are taking diabetes medicines or blood thinners. ? Taking medicines such as aspirin and ibuprofen. These medicines can  thin your blood. Do not take these medicines unless your health care provider tells you to take them. ? Taking over-the-counter medicines, vitamins, herbs, and supplements. General instructions  Follow instructions from your health care provider about eating or drinking restrictions.  Plan to have someone take you home from the hospital or clinic.  If you will be going home right after the procedure, plan to have someone with you for 24 hours.  Ask your health care provider what steps will be taken to help prevent infection. These may include washing your skin with a germ-killing soap. What happens during the procedure?   An IV will be inserted into one of your veins.  Sticky patches (electrodes) or metal paddles may be placed on your chest.  You will be given a medicine to help you relax (sedative).  An electrical shock will be delivered. The procedure may vary among health care providers and hospitals. What can I expect after the procedure?  Your blood pressure, heart rate, breathing rate, and blood oxygen level will be monitored until you leave the hospital or clinic.  Your heart rhythm will be watched to make sure it does not change.  You may have some redness on the skin where the shocks were given. Follow these instructions at home:  Do not drive for 24 hours if you were given a sedative during your procedure.  Take over-the-counter and prescription medicines only as told by your health care provider.  Ask your health care provider how to check your pulse. Check it often.  Rest for 48 hours after the procedure or   as told by your health care provider.  Avoid or limit your caffeine use as told by your health care provider.  Keep all follow-up visits as told by your health care provider. This is important. Contact a health care provider if:  You feel like your heart is beating too quickly or your pulse is not regular.  You have a serious muscle cramp that does not go  away. Get help right away if:  You have discomfort in your chest.  You are dizzy or you feel faint.  You have trouble breathing or you are short of breath.  Your speech is slurred.  You have trouble moving an arm or leg on one side of your body.  Your fingers or toes turn cold or blue. Summary  Electrical cardioversion is the delivery of a jolt of electricity to restore a normal rhythm to the heart.  This procedure may be done right away in an emergency or may be a scheduled procedure if the condition is not an emergency.  Generally, this is a safe procedure.  After the procedure, check your pulse often as told by your health care provider. This information is not intended to replace advice given to you by your health care provider. Make sure you discuss any questions you have with your health care provider. Document Revised: 01/03/2019 Document Reviewed: 01/03/2019 Elsevier Patient Education  2020 [employer] Inc.  

## 2019-09-21 NOTE — Anesthesia Preprocedure Evaluation (Signed)
Anesthesia Evaluation  Patient identified by MRN, date of birth, ID band Patient awake    Reviewed: Allergy & Precautions, H&P , NPO status , Patient's Chart, lab work & pertinent test results  Airway Mallampati: II   Neck ROM: full    Dental   Pulmonary    breath sounds clear to auscultation       Cardiovascular hypertension, + dysrhythmias Atrial Fibrillation  Rhythm:regular Rate:Normal     Neuro/Psych  Neuromuscular disease    GI/Hepatic   Endo/Other    Renal/GU      Musculoskeletal  (+) Arthritis ,   Abdominal   Peds  Hematology   Anesthesia Other Findings   Reproductive/Obstetrics                             Anesthesia Physical Anesthesia Plan  ASA: III  Anesthesia Plan: General   Post-op Pain Management:    Induction: Intravenous  PONV Risk Score and Plan: 2 and Propofol infusion and Treatment may vary due to age or medical condition  Airway Management Planned: Mask  Additional Equipment:   Intra-op Plan:   Post-operative Plan:   Informed Consent: I have reviewed the patients History and Physical, chart, labs and discussed the procedure including the risks, benefits and alternatives for the proposed anesthesia with the patient or authorized representative who has indicated his/her understanding and acceptance.       Plan Discussed with: CRNA, Anesthesiologist and Surgeon  Anesthesia Plan Comments:         Anesthesia Quick Evaluation

## 2019-09-21 NOTE — CV Procedure (Signed)
Direct current cardioversion:  Indication symptomatic A. Fibrillation.  Procedure: Using 40 mg of IV Propofol and 50 IV Lidocaine (for reducing venous pain) for achieving deep sedation, synchronized direct current cardioversion performed. Patient was delivered with 120 Joules of electricity X 1 with success to NSR. Patient tolerated the procedure well. No immediate complication noted.  [name], MD, [facility] 09/21/2019, 1:51 PM [facility] [place] Office: [phone]

## 2019-09-21 NOTE — Telephone Encounter (Addendum)
Dr .Jacinto Halim,  Pt informed me that the date your office scheduled him for May DCCV was the soonest because he could not do the 20th that was offered d/t not being out of town. If you need our office to get one arranged just let me know and I will be glad to aid in getting him scheduled, because we agree he needs sooner DCCV.  Thanks Dory Horn, RN   Update: I spoke to pt who informed me that he is scheduled w/ you today for DCCV.  Glad to hear.  Have a great day Dr. Jacinto Halim!!.

## 2019-09-22 ENCOUNTER — Telehealth: Payer: Self-pay

## 2019-09-22 NOTE — Progress Notes (Signed)
Attempted, unable to leave message 

## 2019-09-22 NOTE — Telephone Encounter (Signed)
Can cancel lab

## 2019-09-22 NOTE — Telephone Encounter (Signed)
Patient no longer needs lab work at this time. Called and notified patient.

## 2019-09-22 NOTE — Telephone Encounter (Signed)
Received a call from patient who has a question about lab work. Patient had a cardioversion yesterday 4/7

## 2019-09-23 NOTE — Anesthesia Postprocedure Evaluation (Signed)
Anesthesia Post Note  Patient: Gregory [name]  Procedure(s) Performed: CARDIOVERSION (N/A )     Patient location during evaluation: Endoscopy Anesthesia Type: General Level of consciousness: awake and alert Pain management: pain level controlled Vital Signs Assessment: post-procedure vital signs reviewed and stable Respiratory status: spontaneous breathing, nonlabored ventilation, respiratory function stable and patient connected to nasal cannula oxygen Cardiovascular status: blood pressure returned to baseline and stable Postop Assessment: no apparent nausea or vomiting Anesthetic complications: no    Last Vitals:  Vitals:   09/21/19 1415 09/21/19 1419  BP: 98/65 110/69  Pulse: 61 60  Resp: (!) 28 (!) 25  Temp:    SpO2: 96% 97%    Last Pain:  Vitals:   09/22/19 1458  TempSrc:   PainSc: 0-No pain                 [name] S

## 2019-09-29 ENCOUNTER — Ambulatory Visit: Payer: Medicare Other

## 2019-09-29 ENCOUNTER — Other Ambulatory Visit: Payer: Self-pay

## 2019-09-29 DIAGNOSIS — R0602 Shortness of breath: Secondary | ICD-10-CM

## 2019-09-29 DIAGNOSIS — I10 Essential (primary) hypertension: Secondary | ICD-10-CM

## 2019-09-29 DIAGNOSIS — I480 Paroxysmal atrial fibrillation: Secondary | ICD-10-CM

## 2019-09-29 DIAGNOSIS — N1832 Chronic kidney disease, stage 3b: Secondary | ICD-10-CM

## 2019-10-07 LAB — VITAMIN D 1,25 DIHYDROXY
Vitamin D 1, 25 (OH)2 Total: 38 pg/mL
Vitamin D2 1, 25 (OH)2: <10 pg/mL
Vitamin D3 1, 25 (OH)2: 36 pg/mL

## 2019-10-10 DIAGNOSIS — M25561 Pain in right knee: Secondary | ICD-10-CM | POA: Insufficient documentation

## 2019-10-10 DIAGNOSIS — G8929 Other chronic pain: Secondary | ICD-10-CM | POA: Insufficient documentation

## 2019-10-11 ENCOUNTER — Ambulatory Visit (INDEPENDENT_AMBULATORY_CARE_PROVIDER_SITE_OTHER): Payer: Medicare Other | Admitting: Student

## 2019-10-11 ENCOUNTER — Telehealth: Payer: Self-pay

## 2019-10-11 ENCOUNTER — Other Ambulatory Visit: Payer: Self-pay

## 2019-10-11 ENCOUNTER — Encounter: Payer: Self-pay | Admitting: Student

## 2019-10-11 VITALS — BP 118/78 | HR 103 | Ht 70.5 in | Wt 191.0 lb

## 2019-10-11 DIAGNOSIS — R0602 Shortness of breath: Secondary | ICD-10-CM

## 2019-10-11 DIAGNOSIS — Z8679 Personal history of other diseases of the circulatory system: Secondary | ICD-10-CM

## 2019-10-11 DIAGNOSIS — Z01812 Encounter for preprocedural laboratory examination: Secondary | ICD-10-CM | POA: Diagnosis not present

## 2019-10-11 DIAGNOSIS — I10 Essential (primary) hypertension: Secondary | ICD-10-CM

## 2019-10-11 DIAGNOSIS — I480 Paroxysmal atrial fibrillation: Secondary | ICD-10-CM | POA: Diagnosis not present

## 2019-10-11 DIAGNOSIS — N1832 Chronic kidney disease, stage 3b: Secondary | ICD-10-CM

## 2019-10-11 DIAGNOSIS — Z9889 Other specified postprocedural states: Secondary | ICD-10-CM

## 2019-10-11 LAB — CBC
Hematocrit: 42.8 % (ref 37.5–51.0)
Hemoglobin: 15.1 g/dL (ref 13.0–17.7)
MCH: 33.4 pg — ABNORMAL HIGH (ref 26.6–33.0)
MCHC: 35.3 g/dL (ref 31.5–35.7)
MCV: 95 fL (ref 79–97)
Platelets: 234 10*3/uL (ref 150–450)
RBC: 4.52 x10E6/uL (ref 4.14–5.80)
RDW: 13.3 % (ref 11.6–15.4)
WBC: 6.3 10*3/uL (ref 3.4–10.8)

## 2019-10-11 LAB — BASIC METABOLIC PANEL
BUN/Creatinine Ratio: 19 (ref 10–24)
BUN: 31 mg/dL — ABNORMAL HIGH (ref 8–27)
CO2: 21 mmol/L (ref 20–29)
Calcium: 9.6 mg/dL (ref 8.6–10.2)
Chloride: 105 mmol/L (ref 96–106)
Creatinine, Ser: 1.65 mg/dL — ABNORMAL HIGH (ref 0.76–1.27)
GFR calc Af Amer: 46 mL/min/{1.73_m2} — ABNORMAL LOW (ref >59)
GFR calc non Af Amer: 39 mL/min/{1.73_m2} — ABNORMAL LOW (ref >59)
Glucose: 85 mg/dL (ref 65–99)
Potassium: 5.1 mmol/L (ref 3.5–5.2)
Sodium: 140 mmol/L (ref 134–144)

## 2019-10-11 MED ORDER — DILTIAZEM HCL ER BEADS 120 MG PO CP24: 120.0000 mg | ORAL_CAPSULE | Freq: Every day | ORAL | 3 refills | Status: DC

## 2019-10-11 NOTE — Progress Notes (Signed)
PCP:  [name], MD Primary Cardiologist: No primary care provider on file. Electrophysiologist: Will Jorja Loa, MD   Gregory [name] is a 79 y.o. male seen today for Will Jorja Loa, MD for routine electrophysiology followup.  Since last being seen in our clinic the patient reports recurrent Atrial fibrillation. He had a DCCV on 4/7 that he says lasted him for about 12 days. The evening he went back out of rhythm, he was having an exacerbation of his chronic R knee pain. He denies chest pain, dyspnea, PND, orthopnea, nausea, vomiting, dizziness, syncope, edema, weight gain, or early satiety. His biggest complaint is palpitations. Denies excessive fatigue. Can just tell when he is out of rhythm and is uncomfortable. He takes diltiazem as needed, and this seems to help his rates.   Past Medical History:  Diagnosis Date  . Arthritis    "right knee" (01/28/2016)  . Diverticulosis of colon (without mention of hemorrhage)   . Dysrhythmia   . Internal hemorrhoids without mention of complication   . Personal history of colonic polyps 01/17/2002   adenomatous, tublar adenoma 02/10/2007  . PNA (pneumonia) 03/31/2012   Past Surgical History:  Procedure Laterality Date  . ATRIAL FIBRILLATION ABLATION  11/28/2016  . ATRIAL FIBRILLATION ABLATION N/A 11/28/2016   Procedure: Atrial Fibrillation Ablation;  Surgeon: Regan Lemming, MD;  [facility] INVASIVE CV LAB;  Service: Cardiovascular;  Laterality: N/A;  . ATRIAL FIBRILLATION ABLATION N/A 03/18/2019   Procedure: ATRIAL FIBRILLATION ABLATION;  Surgeon: Regan Lemming, MD;  Location: [facility] INVASIVE CV LAB;  Service: Cardiovascular;  Laterality: N/A;  . CARDIOVERSION  05/03/2012   Procedure: CARDIOVERSION;  Surgeon: [name], MD;  Location: [facility] ENDOSCOPY;  Service: Cardiovascular;  Laterality: N/A;  . CARDIOVERSION  06/08/2012   Procedure: CARDIOVERSION;  Surgeon: [name], MD;  Location: [facility] ENDOSCOPY;  Service:  Cardiovascular;  Laterality: N/A;  Lawrence Marseilles /janie  . CARDIOVERSION N/A 10/28/2013   Procedure: CARDIOVERSION;  Surgeon: [name], MD;  Location: [facility] ENDOSCOPY;  Service: Cardiovascular;  Laterality: N/A;  . CARDIOVERSION N/A 06/30/2014   Procedure: CARDIOVERSION;  Surgeon: [name], MD;  Location[facility] ENDOSCOPY;  Service: Cardiovascular;  Laterality: N/A;  H&P in file  . CARDIOVERSION N/A 09/05/2014   Procedure: CARDIOVERSION;  Surgeon: [name], MD;  Location: [facility] ENDOSCOPY;  Service: Cardiovascular;  Laterality: N/A;  . CARDIOVERSION N/A 12/24/2015   Procedure: CARDIOVERSION;  Surgeon: [name], MD;  Location: [facility] ENDOSCOPY;  Service: Cardiovascular;  Laterality: N/A;  . CARDIOVERSION N/A 01/30/2016   Procedure: CARDIOVERSION;  Surgeon: [name], MD;  Location: [facility] ENDOSCOPY;  Service: Cardiovascular;  Laterality: N/A;  . CARDIOVERSION N/A 02/08/2016   Procedure: CARDIOVERSION;  Surgeon: [name], MD;  Location: [facility] ENDOSCOPY;  Service: Cardiovascular;  Laterality: N/A;  . CARDIOVERSION N/A 02/08/2019   Procedure: CARDIOVERSION;  Surgeon: [name], MD;  Location: [facility] ENDOSCOPY;  Service: Cardiovascular;  Laterality: N/A;  . CARDIOVERSION N/A 05/24/2019   Procedure: CARDIOVERSION;  Surgeon: Parke Poisson, MD;  Location: [facility] ENDOSCOPY;  Service: Cardiovascular;  Laterality: N/A;  . CARDIOVERSION N/A 09/21/2019   Procedure: CARDIOVERSION;  Surgeon: [name], MD;  Location: [facility] ENDOSCOPY;  Service: Cardiovascular;  Laterality: N/A;  . CATARACT EXTRACTION W/ INTRAOCULAR LENS  IMPLANT, BILATERAL Bilateral   . COLONOSCOPY    . INGUINAL HERNIA REPAIR Bilateral    as child  . KNEE ARTHROSCOPY Right 2004    Current Outpatient Medications  Medication Sig Dispense Refill  . acetaminophen (  TYLENOL) 500 MG tablet Take 1,000 mg by mouth every 6 (six) hours as needed for moderate pain or headache.    Marland Kitchen apixaban (ELIQUIS) 5 MG TABS tablet Take 1 tablet (5 mg total) by mouth 2 (two) times  daily. 180 tablet 3  . diltiazem (TIAZAC) 120 MG 24 hr capsule Take 120 mg by mouth daily as needed. For A. Fib    . dronedarone (MULTAQ) 400 MG tablet Take 1 tablet (400 mg total) by mouth 2 (two) times daily with a meal. 180 tablet 1  . lisinopril (ZESTRIL) 5 MG tablet Take 1 tablet (5 mg total) by mouth daily. 90 tablet 1  . psyllium (METAMUCIL) 58.6 % packet Take 1 packet by mouth daily as needed (constipation).     . rosuvastatin (CRESTOR) 5 MG tablet Take 1 tablet (5 mg total) by mouth daily. 90 tablet 3   No current facility-administered medications for this visit.    No Known Allergies  Social History   Socioeconomic History  . Marital status: Married    Spouse name: Not on file  . Number of children: 2  . Years of education: Not on file  . Highest education level: Not on file  Occupational History    Employer: [employer]  Tobacco Use  . Smoking status: Never Smoker  . Smokeless tobacco: Never Used  Substance and Sexual Activity  . Alcohol use: Yes    Alcohol/week: 1.0 standard drinks    Types: 1 Glasses of wine per week    Comment: daily  . Drug use: No  . Sexual activity: Not Currently  Other Topics Concern  . Not on file  Social History Narrative  . Not on file   Social Determinants of Health   Financial Resource Strain:   . Difficulty of Paying Living Expenses:   Food Insecurity:   . Worried About Charity fundraiser in the Last Year:   . Arboriculturist in the Last Year:   Transportation Needs:   . Film/video editor (Medical):   Marland Kitchen Lack of Transportation (Non-Medical):   Physical Activity:   . Days of Exercise per Week:   . Minutes of Exercise per Session:   Stress:   . Feeling of Stress :   Social Connections:   . Frequency of Communication with Friends and Family:   . Frequency of Social Gatherings with Friends and Family:   . Attends Religious Services:   . Active Member of Clubs or Organizations:   . Attends Archivist Meetings:    Marland Kitchen Marital Status:   Intimate Partner Violence:   . Fear of Current or Ex-Partner:   . Emotionally Abused:   Marland Kitchen Physically Abused:   . Sexually Abused:      Review of Systems: General: No chills, fever, night sweats or weight changes  Cardiovascular:  No chest pain, dyspnea on exertion, edema, orthopnea, palpitations, paroxysmal nocturnal dyspnea Dermatological: No rash, lesions or masses Respiratory: No cough, dyspnea Urologic: No hematuria, dysuria Abdominal: No nausea, vomiting, diarrhea, bright red blood per rectum, melena, or hematemesis Neurologic: No visual changes, weakness, changes in mental status All other systems reviewed and are otherwise negative except as noted above.  Physical Exam: Vitals:   10/11/19 1011  BP: 118/78  Pulse: (!) 103  SpO2: 90%  Weight: 191 lb (86.6 kg)  Height: 5' 10.5" (1.791 m)    GEN- The patient is well appearing, alert and oriented x 3 today.   HEENT: normocephalic, atraumatic; sclera clear,  conjunctiva pink; hearing intact; oropharynx clear; neck supple, no JVP Lymph- no cervical lymphadenopathy Lungs- Clear to ausculation bilaterally, normal work of breathing.  No wheezes, rales, rhonchi Heart- Regular rate and rhythm, no murmurs, rubs or gallops, PMI not laterally displaced GI- soft, non-tender, non-distended, bowel sounds present, no hepatosplenomegaly Extremities- no clubbing, cyanosis, or edema; DP/PT/radial pulses 2+ bilaterally MS- no significant deformity or atrophy Skin- warm and dry, no rash or lesion Psych- euthymic mood, full affect Neuro- strength and sensation are intact  EKG is ordered. Personal review of EKG from today shows AF with RVR at 103 bpm.   Additional studies reviewed include: Previous ablation notes, Recent DCCV, most recent Echo, Previous EP and office notes.   Assessment and Plan:  1. Persistent atrial fibrillation S/p ablation 11/2016 and 03/2019 Continue Eliquis for CHA2DS2VASC of at least 4    Failed tikosyn due to recurrent AF Taken off amiodarone and started on multaq, presumably for chronic use. Pt doesn't remember having any specific issues or difficulties with Amiodarone.  Now back in AF EF 61% by Echo 09/29/2019   2. HTN Continue current medications  3. CKD III Baseline creatinine 1.4 - 1.6.  Discussed options at length with patient 1) Start diltiazem daily for rate control/symptoms of tachypalpitations, and proceed with DCCV.  2) Continue diltiazem as needed only, and proceed with DCCV 3) Will also discuss possibility of switching back to amiodarone with Dr. Elberta Fortis 4) Discussed possibility of transition from rhythm to rate control  At this time, pt prefers to try diltiazem daily to see if this helps with his symptoms, and re-try DCCV with possibility that his knee pain exacerbation/physiologic stress knocked him back out of rhythm.   Will discuss with Dr. Jacinto Halim and Dr. Elberta Fortis for further. Labs today. Plan DCCV next week.   Graciella Freer, PA-C  10/11/19 10:24 AM

## 2019-10-11 NOTE — Telephone Encounter (Signed)
I spoke to the patient and informed him that he may come in early.  He verbalized understanding.

## 2019-10-11 NOTE — Patient Instructions (Addendum)
Medication Instructions:  TAKE DILTIAZEM 120 mg Daily *If you need a refill on your cardiac medications before your next appointment, please call your pharmacy*   Lab Work:  TODAY CBC BMET If you have labs (blood work) drawn today and your tests are completely normal, you will receive your results only by: Marland Kitchen MyChart Message (if you have MyChart) OR . A paper copy in the mail If you have any lab test that is abnormal or we need to change your treatment, we will call you to review the results.   Testing/Procedures: none   Follow-Up: With Dr Elberta Fortis after Cardioversion At [facility], you and your health needs are our priority.  As part of our continuing mission to provide you with exceptional heart care, we have created designated Provider Care Teams.  These Care Teams include your primary Cardiologist (physician) and Advanced Practice Providers (APPs -  Physician Assistants and Nurse Practitioners) who all work together to provide you with the care you need, when you need it.     Other Instructions  Dear [name] are scheduled for a Cardioversion on Monday 10/17/19 with Dr. Tenny Craw.  Please arrive at the [facility] (Main Entrance A) at [facility]: [address] [place], [place] [address] at 10:00 am.   DIET: Nothing to eat or drink after midnight except a sip of water with medications (see medication instructions below)  Medication Instructions:  TAKE ALL MEDICATIONS   You will need to continue your anticoagulant after your procedure until you  are told by your  Provider that it is safe to stop   Labs: TODAY 4/27  You must have a responsible person to drive you home and stay in the waiting area during your procedure. Failure to do so could result in cancellation.  Bring your insurance cards.  *Special Note: Every effort is made to have your procedure done on time. Occasionally there are emergencies that occur at the hospital that may cause delays. Please be  patient if a delay does occur.    CoVid Test:  Thursday 4/29                       [facility]                       [address]                        12:00 pm

## 2019-10-12 ENCOUNTER — Telehealth: Payer: Self-pay

## 2019-10-12 NOTE — Telephone Encounter (Signed)
The patient has been notified of the lab result and verbalized understanding.  All questions (if any) were answered. Sigurd Sos, RN 10/12/2019 11:57 AM

## 2019-10-12 NOTE — Telephone Encounter (Signed)
-----   Message from Graciella Freer, PA-C sent at 10/12/2019 11:49 AM EDT ----- K borderline high. Please make sure he is not taking any unlisted potassium supplementation, and encourage low potassium diet on lisinopril.   Casimiro Needle [address] [place], PA-C  10/12/2019 11:49 AM

## 2019-10-13 ENCOUNTER — Other Ambulatory Visit (HOSPITAL_COMMUNITY)
Admission: RE | Admit: 2019-10-13 | Discharge: 2019-10-13 | Disposition: A | Discharge status: Home / Self Care | Payer: Medicare Other | Source: Ambulatory Visit | Attending: Internal Medicine | Admitting: Internal Medicine

## 2019-10-13 DIAGNOSIS — Z20822 Contact with and (suspected) exposure to covid-19: Secondary | ICD-10-CM | POA: Insufficient documentation

## 2019-10-13 DIAGNOSIS — Z01812 Encounter for preprocedural laboratory examination: Secondary | ICD-10-CM | POA: Insufficient documentation

## 2019-10-13 LAB — SARS CORONAVIRUS 2 (TAT 6-24 HRS): SARS Coronavirus 2: NEGATIVE

## 2019-10-17 ENCOUNTER — Encounter (HOSPITAL_COMMUNITY)
Admission: RE | Disposition: A | Discharge status: Home / Self Care | Payer: Self-pay | Source: Home / Self Care | Attending: Internal Medicine

## 2019-10-17 ENCOUNTER — Telehealth: Payer: Self-pay | Admitting: Cardiology

## 2019-10-17 ENCOUNTER — Ambulatory Visit (HOSPITAL_COMMUNITY)
Admission: RE | Admit: 2019-10-17 | Discharge: 2019-10-17 | Disposition: A | Discharge status: Home / Self Care | Payer: Medicare Other | Attending: Internal Medicine | Admitting: Internal Medicine

## 2019-10-17 DIAGNOSIS — I129 Hypertensive chronic kidney disease with stage 1 through stage 4 chronic kidney disease, or unspecified chronic kidney disease: Secondary | ICD-10-CM | POA: Diagnosis not present

## 2019-10-17 DIAGNOSIS — Z5329 Procedure and treatment not carried out because of patient's decision for other reasons: Secondary | ICD-10-CM | POA: Diagnosis not present

## 2019-10-17 DIAGNOSIS — I4891 Unspecified atrial fibrillation: Secondary | ICD-10-CM | POA: Diagnosis present

## 2019-10-17 DIAGNOSIS — I4819 Other persistent atrial fibrillation: Secondary | ICD-10-CM | POA: Insufficient documentation

## 2019-10-17 DIAGNOSIS — N183 Chronic kidney disease, stage 3 unspecified: Secondary | ICD-10-CM | POA: Insufficient documentation

## 2019-10-17 SURGERY — CANCELLED PROCEDURE

## 2019-10-17 NOTE — Telephone Encounter (Signed)
I spoke to the patient and informed him that he needs to go to the hospital to verify rhythm prior to cancelling Cardioversion.  He verbalized understanding.

## 2019-10-17 NOTE — Telephone Encounter (Signed)
Patient was supposed to have a cardioversion today but he feels like his heart is back in rhythm on its own. He was told by the doctor doing the cardioversion to get an EKG to make sure.  He would like to come in today to have that done if possible. Please advise

## 2019-10-17 NOTE — Progress Notes (Signed)
Pt arrived for cardioversion in sinus rhythm.  Confirmed with EKG and Dr. Tenny Craw.  Pt discharged home.   Roselie Awkward, RN

## 2019-10-18 ENCOUNTER — Other Ambulatory Visit: Payer: Self-pay

## 2019-10-18 ENCOUNTER — Encounter (HOSPITAL_COMMUNITY): Payer: Self-pay

## 2019-10-18 ENCOUNTER — Ambulatory Visit (HOSPITAL_COMMUNITY): Admit: 2019-10-18 | Payer: Medicare Other | Admitting: Cardiology

## 2019-10-18 SURGERY — CARDIOVERSION
Anesthesia: General

## 2019-10-18 MED ORDER — DILTIAZEM HCL ER BEADS 120 MG PO CP24: 120.0000 mg | ORAL_CAPSULE | Freq: Every day | ORAL | 3 refills | Status: DC

## 2019-10-18 NOTE — Telephone Encounter (Signed)
Pt's medication was sent to pt's pharmacy as requested. Confirmation received.  °

## 2019-10-24 NOTE — Progress Notes (Signed)
Primary Physician/Referring:  Christain Sacramento, MD  Patient ID: Gregory [name], male    DOB: 03/03/1941, 79 y.o.   MRN: [id number]  Chief Complaint  Patient presents with  . Atrial Fibrillation  . 3 wk Follow-up    Cardioversion   HPI:    Gregory [name]  is a 79 y.o. Caucasian male with history of stage 3b CKD, hypertension, mild hyperlipidemia and moderate CAD by coronary CTA, paroxysmal atrial fibrillation. He has history of multiple cardioversions and eventually dofetilide was discontinued by Dr. [name] due to efficacy, and was placed on Multaq and has maintained sinus since Dec 2018. Due to recurrence of atrial fibrillation, he underwent 2nd ablation on 02/22/2019, 1st ablation being on 11/28/2016.  He had repeat cardioversion on 05/24/2019.   He was back in atrial fibrillation in April 2021, was scheduled for cardioversion however reverted back to sinus rhythm. He was started on Diltazem which he is tolerating.    Past Medical History:  Diagnosis Date  . Arthritis    "right knee" (01/28/2016)  . Diverticulosis of colon (without mention of hemorrhage)   . Dysrhythmia   . Internal hemorrhoids without mention of complication   . Personal history of colonic polyps 01/17/2002   adenomatous, tublar adenoma 02/10/2007  . PNA (pneumonia) 03/31/2012   Past Surgical History:  Procedure Laterality Date  . ATRIAL FIBRILLATION ABLATION  11/28/2016  . ATRIAL FIBRILLATION ABLATION N/A 11/28/2016   Procedure: Atrial Fibrillation Ablation;  Surgeon: Constance Haw, MD;  Location: [place] [facility];  Service: Cardiovascular;  Laterality: N/A;  . ATRIAL FIBRILLATION ABLATION N/A 03/18/2019   Procedure: ATRIAL FIBRILLATION ABLATION;  Surgeon: Constance Haw, MD;  Location: [place] [facility];  Service: Cardiovascular;  Laterality: N/A;  . CARDIOVERSION  05/03/2012   Procedure: CARDIOVERSION;  Surgeon: Laverda Page, MD;  Location: [facility] ENDOSCOPY;  Service: Cardiovascular;   Laterality: N/A;  . CARDIOVERSION  06/08/2012   Procedure: CARDIOVERSION;  Surgeon: Laverda Page, MD;  Location: [place];  Service: Cardiovascular;  Laterality: N/A;  [name]  . CARDIOVERSION N/A 10/28/2013   Procedure: CARDIOVERSION;  Surgeon: Laverda Page, MD;  Location: [place];  Service: Cardiovascular;  Laterality: N/A;  . CARDIOVERSION N/A 06/30/2014   Procedure: CARDIOVERSION;  Surgeon: Laverda Page, MD;  Location: [place];  Service: Cardiovascular;  Laterality: N/A;  H&P in file  . CARDIOVERSION N/A 09/05/2014   Procedure: CARDIOVERSION;  Surgeon: Adrian Prows, MD;  Location: [place];  Service: Cardiovascular;  Laterality: N/A;  . CARDIOVERSION N/A 12/24/2015   Procedure: CARDIOVERSION;  Surgeon: Adrian Prows, MD;  Location: [place];  Service: Cardiovascular;  Laterality: N/A;  . CARDIOVERSION N/A 01/30/2016   Procedure: CARDIOVERSION;  Surgeon: Adrian Prows, MD;  Location: [place];  Service: Cardiovascular;  Laterality: N/A;  . CARDIOVERSION N/A 02/08/2016   Procedure: CARDIOVERSION;  Surgeon: Adrian Prows, MD;  Location: [facility] ENDOSCOPY;  Service: Cardiovascular;  Laterality: N/A;  . CARDIOVERSION N/A 02/08/2019   Procedure: CARDIOVERSION;  Surgeon: Adrian Prows, MD;  Location: [facility] ENDOSCOPY;  Service: Cardiovascular;  Laterality: N/A;  . CARDIOVERSION N/A 05/24/2019   Procedure: CARDIOVERSION;  Surgeon: Elouise Munroe, MD;  Location: [facility] ENDOSCOPY;  Service: Cardiovascular;  Laterality: N/A;  . CARDIOVERSION N/A 09/21/2019   Procedure: CARDIOVERSION;  Surgeon: Adrian Prows, MD;  Location: [place];  Service: Cardiovascular;  Laterality: N/A;  . CATARACT EXTRACTION W/ INTRAOCULAR LENS  IMPLANT, BILATERAL Bilateral   . COLONOSCOPY    . INGUINAL HERNIA REPAIR  Bilateral    as child  . KNEE ARTHROSCOPY Right 2004   Family History  Problem Relation Age of Onset  . Heart disease Father   . Stroke Father   . Cancer Mother        bladder  . Colon cancer  Neg Hx   . Stomach cancer Neg Hx     Social History   Tobacco Use  . Smoking status: Never Smoker  . Smokeless tobacco: Never Used  Substance Use Topics  . Alcohol use: Yes    Alcohol/week: 1.0 standard drinks    Types: 1 Glasses of wine per week    Comment: daily   Marital Status: Married  ROS  Review of Systems  Constitution: Negative for weight gain.  Cardiovascular: Negative for dyspnea on exertion, leg swelling and syncope.  Respiratory: Negative for shortness of breath.   Musculoskeletal: Negative for joint swelling.   Objective  Blood pressure 131/82, pulse (!) 47, temperature 98.7 F (37.1 C), temperature source Oral, resp. rate 16, height 5' 10.5" (1.791 m), weight 189 lb (85.7 kg).  Vitals with BMI 10/26/2019 10/11/2019 09/21/2019  Height 5' 10.5" 5' 10.5" -  Weight 189 lbs 191 lbs -  BMI 29.93 71.69 -  Systolic 678 938 101  Diastolic 82 78 69  Pulse 47 103 60     Physical Exam  Constitutional: He appears well-developed and well-nourished. No distress.  Cardiovascular: Normal rate, regular rhythm and intact distal pulses. Exam reveals no gallop.  No murmur heard. No leg edema. No JVD.    Pulmonary/Chest: Effort normal and breath sounds normal. No accessory muscle usage. No respiratory distress.  Abdominal: Soft.   Laboratory examination:   Recent Labs    04/01/19 1501 05/17/19 1003 10/11/19 1057  NA 144 139 140  K 4.4 4.3 5.1  CL 106 109 105  CO2 24 21* 21  GLUCOSE 92 147* 85  BUN 25 22 31*  CREATININE 1.48* 1.69* 1.65*  CALCIUM 9.4 9.2 9.6  GFRNONAA 45* 38* 39*  GFRAA 52* 44* 46*   estimated creatinine clearance is 39.3 mL/min (A) (by C-G formula based on SCr of 1.65 mg/dL (H)).  CMP Latest Ref Rng & Units 10/11/2019 05/17/2019 04/01/2019  Glucose 65 - 99 mg/dL 85 147(H) 92  BUN 8 - 27 mg/dL 31(H) 22 25  Creatinine 0.76 - 1.27 mg/dL 1.65(H) 1.69(H) 1.48(H)  Sodium 134 - 144 mmol/L 140 139 144  Potassium 3.5 - 5.2 mmol/L 5.1 4.3 4.4  Chloride 96 -  106 mmol/L 105 109 106  CO2 20 - 29 mmol/L 21 21(L) 24  Calcium 8.6 - 10.2 mg/dL 9.6 9.2 9.4  Total Protein 6.0 - 8.5 g/dL - - -  Total Bilirubin 0.0 - 1.2 mg/dL - - -  Alkaline Phos 39 - 117 IU/L - - -  AST 0 - 40 IU/L - - -  ALT 0 - 44 IU/L - - -   CBC Latest Ref Rng & Units 10/11/2019 05/17/2019 02/22/2019  WBC 3.4 - 10.8 x10E3/uL 6.3 4.6 4.9  Hemoglobin 13.0 - 17.7 g/dL 15.1 14.9 15.0  Hematocrit 37.5 - 51.0 % 42.8 44.8 45.5  Platelets 150 - 450 x10E3/uL 234 183 219   Lipid Panel     Component Value Date/Time   CHOL 107 07/04/2019 0843   TRIG 64 07/04/2019 0843   HDL 50 07/04/2019 0843   LDLCALC 43 07/04/2019 0843   HEMOGLOBIN A1C No results found for: HGBA1C, MPG TSH No results for input(s): TSH in  the last 8760 hours.  External labs:   07/04/2019: PSA 4.900   06/18/2018: TSH 1.69.  Normal H and H, MCV 98, MCH 33, CBC otherwise normal.  Creatinine 1.82, EGFR 35/41, potassium 5.2, CMP otherwise normal.  09/28/2017: Creatinine 1.84, EGFR 35/41, potassium 5.6, CMP otherwise normal.  Normal H&H, MCV 99, CBC otherwise normal.  05/23/2015: Total cholesterol 208, triglycerides 113, HDL 53, LDL 132, creatinine 1.53, eGFR 44, potassium 4.6, CMP otherwise normal, CBC normal, PSA elevated at 5.3  Medications and allergies  No Known Allergies   Current Outpatient Medications  Medication Instructions  . acetaminophen (TYLENOL) 1,000 mg, Oral, Every 6 hours PRN  . apixaban (ELIQUIS) 5 mg, Oral, 2 times daily  . diltiazem (TIAZAC) 120 mg, Oral, Daily  . lisinopril (ZESTRIL) 5 mg, Oral, Daily  . Multaq 400 mg, Oral, 2 times daily with meals  . psyllium (METAMUCIL) 58.6 % packet 1 packet, Oral, Daily PRN  . rosuvastatin (CRESTOR) 5 mg, Oral, Daily   Radiology:   No results found.  Cardiac Studies:   Treadmill stress test [10/09/2014]: Indication: Atrial Fibrillation Resting EKG SR with 1st degree AV Block, LAD, LAFB, RBBB. Trifascicular block. Normal BP response. There was no  ST-T changes of ischemia with exercise stress test. No stress induced arrhythmias. Stress terminated due to THR (>85% MPHR)/MPHR met. The patient exercised according to Bruce Protocol, Total time recorded 06:50 min achieving max heart rate of 158 which was 106% of MPHR for age and 8.34 METS of work.  Echocardiogram [02/26/2016]: 1. Left ventricle cavity is normal in size. Mild concentric hypertrophy of the left ventricle. Normal global wall motion. Calculated EF 68%. 2. Left atrial cavity is moderately dilated. 3. Mild to moderate mitral regurgitation. 4. Mild tricuspid regurgitation. No evidence of pulmonary hypertension. 5. Mild to moderate pulmonic regurgitation. 6. c.f. echo. of 07/19/2013, RV, RA sizes appear normal, TR is less severe, and, there is no pulmonary hypertension. MR appears slightly less severe.  Coronary CTA 03/06/2019: 1. Coronary artery calcium score 1837 Agatston units. This places the patient in the 86th percentile for age and gender, suggesting high risk for future cardiac events. 2. Extensive calcified plaque throughout the coronary system. Mild stenosis for the most part, possibly around 50% stenosis in the distal RCA and the proximal LCx (small vessel). However, difficult to quantify given extensive calcification with possibility of blooming artifact. Will send for FFR. 3.  Pulmonary veins without stenosis.  4.  No LA appendage thrombus seen.  FFR 0.9 mid LAD, FFR 0.93 distal RCA, FFR 0.94 mid LCx: No evidence for hemodynamically significant stenosis.  Echocardiogram 09/29/2019:  Left ventricle cavity is normal in size. Mild concentric hypertrophy of the left ventricle. Normal global wall motion.  Normal LV systolic function  with EF 61%. Indeterminate diastolic filling pattern.  Left atrial cavity is severely dilated.  Moderate (Grade II) mitral regurgitation.  Moderate tricuspid regurgitation. Estimated pulmonary artery systolic pressure is 34 mmHg.  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  EKG:  EKG 10/26/2019: Marked sinus bradycardia at rate of 52 bpm with first-degree AV block, leftward axis, incomplete right bundle branch block.  Poor R wave progression, low voltage complexes.  Pulmonary disease pattern.  Normal QT interval.   No significant change from 07/01/19.    04/0/2021: Atrial fibrillation with rapid ventricular response at the rate of 106 bpm, left axis deviation.  Low-voltage complexes.  Nonspecific T abnormality.  Single PVC.   Assessment     ICD-10-CM  1. Paroxysmal atrial fibrillation (HCC)  I48.0 EKG 12-Lead  2. Shortness of breath  R06.02   3. Stage 3b chronic kidney disease  N18.32    No orders of the defined types were placed in this encounter.   There are no discontinued medications.  Recommendations:   Gregory [name]  is a 79 y.o. male Caucasian male with history of stage 3b CKD, hypertension, mild hyperlipidemia and moderate CAD by coronary CTA, paroxysmal atrial fibrillation. He has history of multiple cardioversions and eventually dofetilide was discontinued by Dr. [name] due to efficacy, and was on Multaq and has maintained sinus since Dec 2018. Due to recurrence of atrial fibrillation, he underwent 2nd ablation on 02/22/2019, 1st ablation being on 11/28/2016.  He had repeat cardioversion on 05/24/2019.   He was back in atrial fibrillation in April 2021, was scheduled for cardioversion however reverted back to sinus rhythm, he is now back on diltiazem which he is tolerating although he has underlying sinus bradycardia and first-degree block.  Continue the same for now, no changes were done, blood pressures well controlled, renal function has remained stable.  I will see him back in 6 months for follow-up.  Adrian Prows, MD, [facility] 10/26/2019, 3:24 PM [facility] [place] Pager: [phone] Office: [phone]

## 2019-10-26 ENCOUNTER — Other Ambulatory Visit: Payer: Self-pay

## 2019-10-26 ENCOUNTER — Ambulatory Visit: Payer: Medicare Other | Admitting: Cardiology

## 2019-10-26 ENCOUNTER — Encounter: Payer: Self-pay | Admitting: Cardiology

## 2019-10-26 VITALS — BP 130/82 | HR 47 | Temp 98.7°F | Resp 16 | Ht 70.5 in | Wt 189.0 lb

## 2019-10-26 DIAGNOSIS — N1832 Chronic kidney disease, stage 3b: Secondary | ICD-10-CM

## 2019-10-26 DIAGNOSIS — I480 Paroxysmal atrial fibrillation: Secondary | ICD-10-CM

## 2019-10-26 DIAGNOSIS — R0602 Shortness of breath: Secondary | ICD-10-CM

## 2019-11-17 ENCOUNTER — Telehealth: Payer: Self-pay | Admitting: Cardiology

## 2019-11-17 NOTE — Telephone Encounter (Signed)
Patient c/o Palpitations:  High priority if patient c/o lightheadedness, shortness of breath, or chest pain  1) How long have you had palpitations/irregular HR/ Afib? Are you having the symptoms now? Couple hours, yes  2) Are you currently experiencing lightheadedness, SOB or CP? no  3) Do you have a history of afib (atrial fibrillation) or irregular heart rhythm? afib  4) Have you checked your BP or HR? (document readings if available): no  5) Are you experiencing any other symptoms? No   Patient states he is back in afib and has been for a couple hours.

## 2019-11-17 NOTE — Telephone Encounter (Signed)
I spoke to the patient who called because his heart had been "irregular" a couple of hours ago, but seems fine now.  He is asymptomatic and has an appointment with Dr Elberta Fortis next week.  He will monitor over the next few days and f/u with Dr Elberta Fortis.  He knows to go to the ED, if symptoms arise.

## 2019-11-22 ENCOUNTER — Ambulatory Visit (INDEPENDENT_AMBULATORY_CARE_PROVIDER_SITE_OTHER): Payer: Medicare Other | Admitting: Cardiology

## 2019-11-22 ENCOUNTER — Telehealth: Payer: Self-pay | Admitting: Pharmacist

## 2019-11-22 ENCOUNTER — Other Ambulatory Visit: Payer: Self-pay

## 2019-11-22 ENCOUNTER — Encounter: Payer: Self-pay | Admitting: Cardiology

## 2019-11-22 VITALS — BP 122/68 | HR 90 | Ht 70.0 in | Wt 183.6 lb

## 2019-11-22 DIAGNOSIS — I4819 Other persistent atrial fibrillation: Secondary | ICD-10-CM | POA: Diagnosis not present

## 2019-11-22 NOTE — Progress Notes (Signed)
Electrophysiology Office Note   Date:  11/22/2019   ID:  Gregory [name], DOB [birth year], MRN [id number]  PCP:  [name], MD  Cardiologist:  Jacinto Halim Primary Electrophysiologist:  Regan Lemming, MD    No chief complaint on file.    History of Present Illness: Gregory [name] is a 79 y.o. male who presents today for electrophysiology evaluation.   He has history of atrial fibrillation, hypertension. He has had multiple cardioversions and has since been put on dofetilide. He was having breakthrough episodes of atrial fibrillation and had ablation on 11/28/16.  He was switched from dofetilide to amiodarone.  Despite this he continued to have episodes of atrial fibrillation and is now status post repeat AF ablation 03/18/2019.  Today, denies symptoms of palpitations, chest pain, shortness of breath, orthopnea, PND, lower extremity edema, claudication, dizziness, presyncope, syncope, bleeding, or neurologic sequela. The patient is tolerating medications without difficulties.  Unfortunately he has continued to go in and out of atrial fibrillation since his ablation.  He is currently on Multaq, but this does not appear to be controlling his arrhythmias.  He has weakness, fatigue, and mild shortness of breath.  Past Medical History:  Diagnosis Date  . Arthritis    "right knee" (01/28/2016)  . Diverticulosis of colon (without mention of hemorrhage)   . Dysrhythmia   . Internal hemorrhoids without mention of complication   . Personal history of colonic polyps 01/17/2002   adenomatous, tublar adenoma 02/10/2007  . PNA (pneumonia) 03/31/2012   Past Surgical History:  Procedure Laterality Date  . ATRIAL FIBRILLATION ABLATION  11/28/2016  . ATRIAL FIBRILLATION ABLATION N/A 11/28/2016   Procedure: Atrial Fibrillation Ablation;  Surgeon: Regan Lemming, MD;  Location: [facility] INVASIVE CV LAB;  Service: Cardiovascular;  Laterality: N/A;  . ATRIAL FIBRILLATION ABLATION N/A 03/18/2019    Procedure: ATRIAL FIBRILLATION ABLATION;  Surgeon: Regan Lemming, MD;  Location: [facility] INVASIVE CV LAB;  Service: Cardiovascular;  Laterality: N/A;  . CARDIOVERSION  05/03/2012   Procedure: CARDIOVERSION;  Surgeon: [name], MD;  Location: [facility] ENDOSCOPY;  Service: Cardiovascular;  Laterality: N/A;  . CARDIOVERSION  06/08/2012   Procedure: CARDIOVERSION;  Surgeon: [name], MD;  Location: [facility] ENDOSCOPY;  Service: Cardiovascular;  Laterality: N/A;  Gregory [name]  . CARDIOVERSION N/A 10/28/2013   Procedure: CARDIOVERSION;  Surgeon: [name], MD;  Location: [facility] ENDOSCOPY;  Service: Cardiovascular;  Laterality: N/A;  . CARDIOVERSION N/A 06/30/2014   Procedure: CARDIOVERSION;  Surgeon: [name], MD;  Location: [facility] ENDOSCOPY;  Service: Cardiovascular;  Laterality: N/A;  H&P in file  . CARDIOVERSION N/A 09/05/2014   Procedure: CARDIOVERSION;  Surgeon: [name], MD;  Location: [facility] ENDOSCOPY;  Service: Cardiovascular;  Laterality: N/A;  . CARDIOVERSION N/A 12/24/2015   Procedure: CARDIOVERSION;  Surgeon: [name], MD;  Location: [facility] ENDOSCOPY;  Service: Cardiovascular;  Laterality: N/A;  . CARDIOVERSION N/A 01/30/2016   Procedure: CARDIOVERSION;  Surgeon: [name], MD;  Location: [facility] ENDOSCOPY;  Service: Cardiovascular;  Laterality: N/A;  . CARDIOVERSION N/A 02/08/2016   Procedure: CARDIOVERSION;  Surgeon: [name], MD;  Location: [facility] ENDOSCOPY;  Service: Cardiovascular;  Laterality: N/A;  . CARDIOVERSION N/A 02/08/2019   Procedure: CARDIOVERSION;  Surgeon: [name], MD;  Location: [facility] ENDOSCOPY;  Service: Cardiovascular;  Laterality: N/A;  . CARDIOVERSION N/A 05/24/2019   Procedure: CARDIOVERSION;  Surgeon: Parke Poisson, MD;  Location: [facility] ENDOSCOPY;  Service: Cardiovascular;  Laterality: N/A;  . CARDIOVERSION N/A 09/21/2019   Procedure: CARDIOVERSION;  Surgeon: [name], MD;  Location: [facility] ENDOSCOPY;  Service: Cardiovascular;  Laterality: N/A;  . CATARACT EXTRACTION W/  INTRAOCULAR LENS  IMPLANT, BILATERAL Bilateral   . COLONOSCOPY    . INGUINAL HERNIA REPAIR Bilateral    as child  . KNEE ARTHROSCOPY Right 2004     Current Outpatient Medications  Medication Sig Dispense Refill  . acetaminophen (TYLENOL) 500 MG tablet Take 1,000 mg by mouth every 6 (six) hours as needed for moderate pain or headache.    Marland Kitchen apixaban (ELIQUIS) 5 MG TABS tablet Take 1 tablet (5 mg total) by mouth 2 (two) times daily. 180 tablet 3  . diltiazem (TIAZAC) 120 MG 24 hr capsule Take 1 capsule (120 mg total) by mouth daily. 90 capsule 3  . dronedarone (MULTAQ) 400 MG tablet Take 1 tablet (400 mg total) by mouth 2 (two) times daily with a meal. 180 tablet 1  . lisinopril (ZESTRIL) 5 MG tablet Take 1 tablet (5 mg total) by mouth daily. 90 tablet 1  . psyllium (METAMUCIL) 58.6 % packet Take 1 packet by mouth daily as needed (constipation).     . rosuvastatin (CRESTOR) 5 MG tablet Take 1 tablet (5 mg total) by mouth daily. 90 tablet 3   No current facility-administered medications for this visit.    Allergies:   Patient has no known allergies.   Social History:  The patient  reports that he has never smoked. He has never used smokeless tobacco. He reports current alcohol use of about 1.0 standard drinks of alcohol per week. He reports that he does not use drugs.   Family History:  The patient's family history includes Cancer in his mother; Heart disease in his father; Stroke in his father.   ROS:  Please see the history of present illness.   Otherwise, review of systems is positive for none.   All other systems are reviewed and negative.   PHYSICAL EXAM: VS:  BP 122/68   Pulse 90   Ht 5\' 10"  (1.778 m)   Wt 183 lb 9.6 oz (83.3 kg)   SpO2 97%   BMI 26.34 kg/m  , BMI Body mass index is 26.34 kg/m. GEN: Well nourished, well developed, in no acute distress  HEENT: normal  Neck: no JVD, carotid bruits, or masses Cardiac: irregular; no murmurs, rubs, or gallops,no edema    Respiratory:  clear to auscultation bilaterally, normal work of breathing GI: soft, nontender, nondistended, + BS MS: no deformity or atrophy  Skin: warm and dry Neuro:  Strength and sensation are intact Psych: euthymic mood, full affect  EKG:  EKG is ordered today. Personal review of the ekg ordered shows atrial fibrillation, rate 90  Recent Labs: 12/29/2018: ALT 14 02/03/2019: Magnesium 2.4 10/11/2019: BUN 31; Creatinine, Ser 1.65; Hemoglobin 15.1; Platelets 234; Potassium 5.1; Sodium 140    Lipid Panel     Component Value Date/Time   CHOL 107 07/04/2019 0843   TRIG 64 07/04/2019 0843   HDL 50 07/04/2019 0843   LDLCALC 43 07/04/2019 0843     Wt Readings from Last 3 Encounters:  11/22/19 183 lb 9.6 oz (83.3 kg)  10/26/19 189 lb (85.7 kg)  10/11/19 191 lb (86.6 kg)      Other studies Reviewed: Additional studies/ records that were reviewed today include: TTE October 01, 2019 Review of the above records today demonstrates:  Left ventricle cavity is normal in size. Mild concentric hypertrophy of  the left ventricle. Normal global wall motion. Normal LV systolic function  with EF 61%. Indeterminate diastolic filling pattern.  Left atrial cavity is severely dilated.  Moderate (Grade II) mitral regurgitation.  Moderate tricuspid regurgitation. Estimated pulmonary artery systolic  pressure is 34 mmHg.    ASSESSMENT AND PLAN:  1.  Persistent atrial fibrillation: On Multaq and Eliquis.  CHA2DS2-VASc of 3.  Status post repeat ablation March 18, 2019.  Unfortunately he did go back into atrial fibrillation.  He is in atrial fibrillation today.  At this point, he would like to try to avoid amiodarone.  I do not think a repeat ablation is a good option.  We Roseana Rhine thus plan for dofetilide admission.  2. Hypertension: Currently well controlled    Current medicines are reviewed at length with the patient today.   The patient does not have concerns regarding his medicines.  The  following changes were made today: None  Labs/ tests ordered today include:  Orders Placed This Encounter  Procedures  . EKG 12-Lead    Disposition:   FU with Ahmia Colford 3 months  Signed, Fitzroy Mikami Meredith Leeds, MD  11/22/2019 9:40 AM     [facility] [address] [place] [address] [phone] (office) [phone] (fax)

## 2019-11-22 NOTE — Telephone Encounter (Signed)
From patient.

## 2019-11-22 NOTE — Telephone Encounter (Signed)
6/14 is okay

## 2019-11-22 NOTE — Telephone Encounter (Signed)
Medication list reviewed in anticipation of upcoming Tikosyn initiation. Patient is not taking any contraindicated or QTc prolonging medications.   Patient is anticoagulated on Eliquis 5mg BID on the appropriate dose. Please ensure that patient has not missed any anticoagulation doses in the 3 weeks prior to Tikosyn initiation.   Patient will need to be counseled to avoid use of Benadryl while on Tikosyn and in the 2-3 days prior to Tikosyn initiation. 

## 2019-11-22 NOTE — Patient Instructions (Signed)
Medication Instructions:  Your physician has recommended you make the following change in your medication:  1. STOP Multaq  Dr. Elberta Fortis recommends re-start Tikosyn.  Please see the instructions below and contact AFib clinic once you have confirmed affordability.  *If you need a refill on your cardiac medications before your next appointment, please call your pharmacy*   Lab Work: None ordered If you have labs (blood work) drawn today and your tests are completely normal, you will receive your results only by: Marland Kitchen MyChart Message (if you have MyChart) OR . A paper copy in the mail If you have any lab test that is abnormal or we need to change your treatment, we will call you to review the results.   Testing/Procedures: None ordered   Follow-Up: At [facility], you and your health needs are our priority.  As part of our continuing mission to provide you with exceptional heart care, we have created designated Provider Care Teams.  These Care Teams include your primary Cardiologist (physician) and Advanced Practice Providers (APPs -  Physician Assistants and Nurse Practitioners) who all work together to provide you with the care you need, when you need it.  We recommend signing up for the patient portal called "MyChart".  Sign up information is provided on this After Visit Summary.  MyChart is used to connect with patients for Virtual Visits (Telemedicine).  Patients are able to view lab/test results, encounter notes, upcoming appointments, etc.  Non-urgent messages can be sent to your provider as well.   To learn more about what you can do with MyChart, go to [email or web address].    Your next appointment:   3 month(s)  The format for your next appointment:   In Person  Provider:   Loman Brooklyn, MD   Thank you for choosing [facility]!!   Dory Horn, RN [phone]    Other Instructions  Preparing for Tikosyn (Dofetilide) Admission  1) Check with insurance  company for cost of drug to ensure affordability.  Dofetilide 500 mcg twice a day  2) Tikosyn admission requires a 3 day hospital stay with constant telemetry monitoring. You will have and EKG after each dose of Tikosyn.  If the drug does not convert you to normal rhythm a cardioversion will be performed prior to discharge.  3) A pharmacist will review all of your medications.  If there are any interactions noted we will be in touch with you.  4) Please ensure no missed doses of your anticoagulation (blood thinner) for the past 3 weeks prior to admission.  5) No Benadryl is allowed 3 days prior to admission.  6) On day of admission - you will come in for office visit in the Afib Clinic where lab work will be drawn.  IF the labs are acceptable an inpatient bed will be requested.  7) Normally admission to a bed is straight from our office.  This is all dependent on bed availability.  In some instances you may be sent home and bed placement will call later in the day when a bed is available.  8) You may bring personal belongings/clothing with you to the hospital.  Please leave your suitcase in the care until you arrive in admissions.  Questions - please call the office at [phone]

## 2019-11-23 ENCOUNTER — Telehealth (HOSPITAL_COMMUNITY): Payer: Self-pay | Admitting: *Deleted

## 2019-11-23 NOTE — Telephone Encounter (Signed)
Patient called in stating he decided to have Dr. Jacinto Halim admit for tikosyn loading instead of Dr. Elberta Fortis -- will make Dr. Elberta Fortis office aware.

## 2019-11-25 ENCOUNTER — Other Ambulatory Visit (HOSPITAL_COMMUNITY)
Admission: RE | Admit: 2019-11-25 | Discharge: 2019-11-25 | Disposition: A | Discharge status: Home / Self Care | Payer: Medicare Other | Source: Ambulatory Visit | Attending: Cardiology | Admitting: Cardiology

## 2019-11-25 DIAGNOSIS — Z01812 Encounter for preprocedural laboratory examination: Secondary | ICD-10-CM | POA: Insufficient documentation

## 2019-11-25 DIAGNOSIS — Z20822 Contact with and (suspected) exposure to covid-19: Secondary | ICD-10-CM | POA: Insufficient documentation

## 2019-11-25 LAB — SARS CORONAVIRUS 2 (TAT 6-24 HRS): SARS Coronavirus 2: NEGATIVE

## 2019-11-28 ENCOUNTER — Encounter (HOSPITAL_COMMUNITY): Payer: Self-pay | Admitting: Cardiology

## 2019-11-28 ENCOUNTER — Inpatient Hospital Stay (HOSPITAL_COMMUNITY)
Admission: RE | Admit: 2019-11-28 | Discharge: 2019-12-01 | DRG: 310 | Disposition: A | Discharge status: Home / Self Care | Payer: Medicare Other | Attending: Cardiology | Admitting: Cardiology

## 2019-11-28 ENCOUNTER — Other Ambulatory Visit: Payer: Self-pay

## 2019-11-28 DIAGNOSIS — Z79899 Other long term (current) drug therapy: Secondary | ICD-10-CM

## 2019-11-28 DIAGNOSIS — I4819 Other persistent atrial fibrillation: Secondary | ICD-10-CM | POA: Diagnosis not present

## 2019-11-28 DIAGNOSIS — N183 Chronic kidney disease, stage 3 unspecified: Secondary | ICD-10-CM | POA: Diagnosis present

## 2019-11-28 DIAGNOSIS — Z5181 Encounter for therapeutic drug level monitoring: Secondary | ICD-10-CM | POA: Diagnosis not present

## 2019-11-28 DIAGNOSIS — Z20822 Contact with and (suspected) exposure to covid-19: Secondary | ICD-10-CM | POA: Diagnosis not present

## 2019-11-28 DIAGNOSIS — I440 Atrioventricular block, first degree: Secondary | ICD-10-CM | POA: Diagnosis present

## 2019-11-28 DIAGNOSIS — I130 Hypertensive heart and chronic kidney disease with heart failure and stage 1 through stage 4 chronic kidney disease, or unspecified chronic kidney disease: Secondary | ICD-10-CM | POA: Diagnosis present

## 2019-11-28 DIAGNOSIS — E1122 Type 2 diabetes mellitus with diabetic chronic kidney disease: Secondary | ICD-10-CM | POA: Diagnosis present

## 2019-11-28 DIAGNOSIS — I2510 Atherosclerotic heart disease of native coronary artery without angina pectoris: Secondary | ICD-10-CM | POA: Diagnosis present

## 2019-11-28 DIAGNOSIS — I129 Hypertensive chronic kidney disease with stage 1 through stage 4 chronic kidney disease, or unspecified chronic kidney disease: Secondary | ICD-10-CM | POA: Diagnosis present

## 2019-11-28 DIAGNOSIS — N1831 Chronic kidney disease, stage 3a: Secondary | ICD-10-CM | POA: Diagnosis present

## 2019-11-28 DIAGNOSIS — I480 Paroxysmal atrial fibrillation: Secondary | ICD-10-CM | POA: Diagnosis present

## 2019-11-28 LAB — BASIC METABOLIC PANEL
Anion gap: 6 (ref 5–15)
BUN: 31 mg/dL — ABNORMAL HIGH (ref 8–23)
CO2: 26 mmol/L (ref 22–32)
Calcium: 8.8 mg/dL — ABNORMAL LOW (ref 8.9–10.3)
Chloride: 106 mmol/L (ref 98–111)
Creatinine, Ser: 1.55 mg/dL — ABNORMAL HIGH (ref 0.61–1.24)
GFR calc Af Amer: 49 mL/min — ABNORMAL LOW (ref >60)
GFR calc non Af Amer: 42 mL/min — ABNORMAL LOW (ref >60)
Glucose, Bld: 126 mg/dL — ABNORMAL HIGH (ref 70–99)
Potassium: 5 mmol/L (ref 3.5–5.1)
Sodium: 138 mmol/L (ref 135–145)

## 2019-11-28 LAB — CBC WITH DIFFERENTIAL/PLATELET
Abs Immature Granulocytes: 0.01 10*3/uL (ref 0.00–0.07)
Basophils Absolute: 0 10*3/uL (ref 0.0–0.1)
Basophils Relative: 0 %
Eosinophils Absolute: 0.1 10*3/uL (ref 0.0–0.5)
Eosinophils Relative: 2 %
HCT: 41.6 % (ref 39.0–52.0)
Hemoglobin: 13.7 g/dL (ref 13.0–17.0)
Immature Granulocytes: 0 %
Lymphocytes Relative: 25 %
Lymphs Abs: 1.2 10*3/uL (ref 0.7–4.0)
MCH: 33 pg (ref 26.0–34.0)
MCHC: 32.9 g/dL (ref 30.0–36.0)
MCV: 100.2 fL — ABNORMAL HIGH (ref 80.0–100.0)
Monocytes Absolute: 0.4 10*3/uL (ref 0.1–1.0)
Monocytes Relative: 7 %
Neutro Abs: 3.1 10*3/uL (ref 1.7–7.7)
Neutrophils Relative %: 66 %
Platelets: 167 10*3/uL (ref 150–400)
RBC: 4.15 MIL/uL — ABNORMAL LOW (ref 4.22–5.81)
RDW: 14 % (ref 11.5–15.5)
WBC: 4.7 10*3/uL (ref 4.0–10.5)
nRBC: 0 % (ref 0.0–0.2)

## 2019-11-28 LAB — MAGNESIUM: Magnesium: 2.3 mg/dL (ref 1.7–2.4)

## 2019-11-28 MED ORDER — SODIUM CHLORIDE 0.9% FLUSH: 3.0000 mL | INTRAVENOUS | Status: DC | PRN

## 2019-11-28 MED ORDER — SODIUM CHLORIDE 0.9% FLUSH
3.0000 mL | Freq: Two times a day (BID) | INTRAVENOUS | Status: DC
  Administered 2019-11-28: 3 mL via INTRAVENOUS

## 2019-11-28 MED ORDER — ACETAMINOPHEN 500 MG PO TABS: 1000.0000 mg | ORAL_TABLET | Freq: Four times a day (QID) | ORAL | Status: DC | PRN

## 2019-11-28 MED ORDER — DILTIAZEM HCL ER COATED BEADS 120 MG PO CP24
120.0000 mg | ORAL_CAPSULE | Freq: Every day | ORAL | Status: DC
  Administered 2019-11-29 – 2019-12-01 (×3): 120 mg via ORAL
  Filled 2019-11-28 (×4): qty 1

## 2019-11-28 MED ORDER — ACETAMINOPHEN 325 MG PO TABS: 650.0000 mg | ORAL_TABLET | ORAL | Status: DC | PRN

## 2019-11-28 MED ORDER — PSYLLIUM 95 % PO PACK
1.0000 | PACK | Freq: Every day | ORAL | Status: DC | PRN
  Filled 2019-11-28: qty 1

## 2019-11-28 MED ORDER — DOFETILIDE 250 MCG PO CAPS
250.0000 ug | ORAL_CAPSULE | Freq: Two times a day (BID) | ORAL | Status: DC
  Administered 2019-11-28 – 2019-12-01 (×6): 250 ug via ORAL
  Filled 2019-11-28 (×6): qty 1

## 2019-11-28 MED ORDER — SODIUM CHLORIDE 0.9 % IV SOLN: 250.0000 mL | INTRAVENOUS | Status: DC | PRN

## 2019-11-28 MED ORDER — LISINOPRIL 5 MG PO TABS
5.0000 mg | ORAL_TABLET | Freq: Every day | ORAL | Status: DC
  Administered 2019-11-28 – 2019-11-30 (×3): 5 mg via ORAL
  Filled 2019-11-28 (×4): qty 1

## 2019-11-28 MED ORDER — ROSUVASTATIN CALCIUM 5 MG PO TABS
5.0000 mg | ORAL_TABLET | Freq: Every day | ORAL | Status: DC
  Administered 2019-11-29 – 2019-12-01 (×3): 5 mg via ORAL
  Filled 2019-11-28 (×3): qty 1

## 2019-11-28 MED ORDER — ONDANSETRON HCL 4 MG/2ML IJ SOLN: 4.0000 mg | Freq: Four times a day (QID) | INTRAMUSCULAR | Status: DC | PRN

## 2019-11-28 MED ORDER — APIXABAN 5 MG PO TABS
5.0000 mg | ORAL_TABLET | Freq: Two times a day (BID) | ORAL | Status: DC
  Administered 2019-11-28 – 2019-12-01 (×6): 5 mg via ORAL
  Filled 2019-11-28 (×6): qty 1

## 2019-11-28 NOTE — H&P (Signed)
Gregory [name] is an 79 y.o. male.   Chief Complaint: Atrial fibrillation HPI:   79 y.o. Caucasian male  with hypertension, elevated calcium score, nonobstructive CAD, persistent atrial fibrillation   Patient has long history of Afib, with multiple prior cardioversion, and ablation X2. He now has had recurrence of Afib. He was seen by EP Dr. Elberta Fortis. Ablation was not thought be a good option in this setting, and dofetilide initiation was recommended.   He has no specific complaints at this time. He has been fully vaccinated against COVID.  Past Medical History:  Diagnosis Date  . Arthritis    "right knee" (01/28/2016)  . Diverticulosis of colon (without mention of hemorrhage)   . Dysrhythmia   . Internal hemorrhoids without mention of complication   . Personal history of colonic polyps 01/17/2002   adenomatous, tublar adenoma 02/10/2007  . PNA (pneumonia) 03/31/2012    Past Surgical History:  Procedure Laterality Date  . ATRIAL FIBRILLATION ABLATION  11/28/2016  . ATRIAL FIBRILLATION ABLATION N/A 11/28/2016   Procedure: Atrial Fibrillation Ablation;  Surgeon: Regan Lemming, MD;  Location: [facility] INVASIVE CV LAB;  Service: Cardiovascular;  Laterality: N/A;  . ATRIAL FIBRILLATION ABLATION N/A 03/18/2019   Procedure: ATRIAL FIBRILLATION ABLATION;  Surgeon: Regan Lemming, MD;  Location: [facility] INVASIVE CV LAB;  Service: Cardiovascular;  Laterality: N/A;  . CARDIOVERSION  05/03/2012   Procedure: CARDIOVERSION;  Surgeon: [name], MD;  Location: [facility] ENDOSCOPY;  Service: Cardiovascular;  Laterality: N/A;  . CARDIOVERSION  06/08/2012   Procedure: CARDIOVERSION;  Surgeon: [name], MD;  Location: [facility] ENDOSCOPY;  Service: Cardiovascular;  Laterality: N/A;  Lawrence Marseilles /janie  . CARDIOVERSION N/A 10/28/2013   Procedure: CARDIOVERSION;  Surgeon: [name], MD;  Location: [facility] ENDOSCOPY;  Service: Cardiovascular;  Laterality: N/A;  . CARDIOVERSION N/A 06/30/2014   Procedure:  CARDIOVERSION;  Surgeon: [name], MD;  Location: [facility] ENDOSCOPY;  Service: Cardiovascular;  Laterality: N/A;  H&P in file  . CARDIOVERSION N/A 09/05/2014   Procedure: CARDIOVERSION;  Surgeon: [name], MD;  Location: [facility] ENDOSCOPY;  Service: Cardiovascular;  Laterality: N/A;  . CARDIOVERSION N/A 12/24/2015   Procedure: CARDIOVERSION;  Surgeon: [name], MD;  Location: [facility] ENDOSCOPY;  Service: Cardiovascular;  Laterality: N/A;  . CARDIOVERSION N/A 01/30/2016   Procedure: CARDIOVERSION;  Surgeon: [name], MD;  Location: [facility] ENDOSCOPY;  Service: Cardiovascular;  Laterality: N/A;  . CARDIOVERSION N/A 02/08/2016   Procedure: CARDIOVERSION;  Surgeon: [name], MD;  Location: [facility] ENDOSCOPY;  Service: Cardiovascular;  Laterality: N/A;  . CARDIOVERSION N/A 02/08/2019   Procedure: CARDIOVERSION;  Surgeon: [name], MD;  Location: [facility] ENDOSCOPY;  Service: Cardiovascular;  Laterality: N/A;  . CARDIOVERSION N/A 05/24/2019   Procedure: CARDIOVERSION;  Surgeon: Parke Poisson, MD;  Location: [facility] ENDOSCOPY;  Service: Cardiovascular;  Laterality: N/A;  . CARDIOVERSION N/A 09/21/2019   Procedure: CARDIOVERSION;  Surgeon: [name], MD;  Location: [facility] ENDOSCOPY;  Service: Cardiovascular;  Laterality: N/A;  . CATARACT EXTRACTION W/ INTRAOCULAR LENS  IMPLANT, BILATERAL Bilateral   . COLONOSCOPY    . INGUINAL HERNIA REPAIR Bilateral    as child  . KNEE ARTHROSCOPY Right 2004    Family History  Problem Relation Age of Onset  . Heart disease Father   . Stroke Father   . Cancer Mother        bladder  . Colon cancer Neg Hx   . Stomach cancer Neg Hx    Social History:  reports that he has never  smoked. He has never used smokeless tobacco. He reports current alcohol use of about 1.0 standard drink of alcohol per week. He reports that he does not use drugs.  Allergies: No Known Allergies  ROS   Blood pressure 108/68, pulse 85, temperature 97.7 F (36.5 C), temperature source Oral, resp. rate 16,  height 5\' 10"  (1.778 m), weight 83.4 kg, SpO2 98 %. Body mass index is 26.39 kg/m.  Physical Exam  No results found for this or any previous visit (from the past 48 hour(s)).  Labs:   Lab Results  Component Value Date   WBC 6.3 10/11/2019   HGB 15.1 10/11/2019   HCT 42.8 10/11/2019   MCV 95 10/11/2019   PLT 234 10/11/2019   No results for input(s): NA, K, CL, CO2, BUN, CREATININE, CALCIUM, PROT, BILITOT, ALKPHOS, ALT, AST, GLUCOSE in the last 168 hours.  Invalid input(s): LABALBU  Lipid Panel     Component Value Date/Time   CHOL 107 07/04/2019 0843   TRIG 64 07/04/2019 0843   HDL 50 07/04/2019 0843   LDLCALC 43 07/04/2019 0843    BNP (last 3 results) No results for input(s): BNP in the last 8760 hours.  HEMOGLOBIN A1C No results found for: HGBA1C, MPG  Cardiac Panel (last 3 results) No results for input(s): CKTOTAL, CKMB, RELINDX in the last 8760 hours.  Invalid input(s): TROPONINHS  No results found for: CKTOTAL, CKMB, CKMBINDEX   TSH No results for input(s): TSH in the last 8760 hours.   Medications Prior to Admission  Medication Sig Dispense Refill  . acetaminophen (TYLENOL) 500 MG tablet Take 1,000 mg by mouth every 6 (six) hours as needed for moderate pain or headache.    07/06/2019 apixaban (ELIQUIS) 5 MG TABS tablet Take 1 tablet (5 mg total) by mouth 2 (two) times daily. 180 tablet 3  . diltiazem (TIAZAC) 120 MG 24 hr capsule Take 1 capsule (120 mg total) by mouth daily. 90 capsule 3  . lisinopril (ZESTRIL) 5 MG tablet Take 1 tablet (5 mg total) by mouth daily. 90 tablet 1  . psyllium (METAMUCIL) 58.6 % packet Take 1 packet by mouth daily as needed (constipation).     . rosuvastatin (CRESTOR) 5 MG tablet Take 1 tablet (5 mg total) by mouth daily. 90 tablet 3      Current Facility-Administered Medications:  .  0.9 %  sodium chloride infusion, 250 mL, Intravenous, PRN, Charlot Gouin J, MD .  acetaminophen (TYLENOL) tablet 1,000 mg, 1,000 mg, Oral, Q6H  PRN, Devora Tortorella J, MD .  acetaminophen (TYLENOL) tablet 650 mg, 650 mg, Oral, Q4H PRN, Wilbur Labuda J, MD .  apixaban (ELIQUIS) tablet 5 mg, 5 mg, Oral, BID, Mashell Sieben J, MD .  diltiazem (TIAZAC) 24 hr capsule 120 mg, 120 mg, Oral, Daily, Camden Mazzaferro J, MD .  lisinopril (ZESTRIL) tablet 5 mg, 5 mg, Oral, Daily, Kayven Aldaco J, MD .  ondansetron (ZOFRAN) injection 4 mg, 4 mg, Intravenous, Q6H PRN, Cohl Behrens J, MD .  psyllium (METAMUCIL) 58.6 % packet 1 packet, 1 packet, Oral, Daily PRN, Kenya Shiraishi J, MD .  rosuvastatin (CRESTOR) tablet 5 mg, 5 mg, Oral, Daily, Jibran Crookshanks J, MD .  sodium chloride flush (NS) 0.9 % injection 3 mL, 3 mL, Intravenous, Q12H, Rhylee Nunn J, MD .  sodium chloride flush (NS) 0.9 % injection 3 mL, 3 mL, Intravenous, PRN, Manha Amato J, MD   Today's Vitals   11/28/19 1138  BP: 108/68  Pulse: 85  Resp: 16  Temp: 97.7 F (36.5 C)  TempSrc: Oral  SpO2: 98%  Weight: 83.4 kg  Height: 5\' 10"  (1.778 m)  PainSc: 0-No pain   Body mass index is 26.39 kg/m.   CARDIAC STUDIES:  EKG 11/28/2019: Afib 103 bpm Nonspecific ST-T changes QTc 424 msec  Echocardiogram 09/29/2019:  Left ventricle cavity is normal in size. Mild concentric hypertrophy of  the left ventricle. Normal global wall motion. Normal LV systolic function  with EF 61%. Indeterminate diastolic filling pattern.  Left atrial cavity is severely dilated.  Moderate (Grade II) mitral regurgitation.  Moderate tricuspid regurgitation. Estimated pulmonary artery systolic  pressure is 34 mmHg.  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   CTA FFR 03/14/2019: FFR 0.9 mid LAD FFR 0.93 distal RCA FFR 0.94 mid LCx IMPRESSION: No evidence for hemodynamically significant stenosis.  CTA 03/14/2019: 1. Coronary artery calcium score 1837 Agatston units. This places the patient in the 86th percentile for age and gender,  suggesting high risk for future cardiac events. 2. Extensive calcified plaque throughout the coronary system. Mild stenosis for the most part, possibly around 50% stenosis in the distal RCA and the proximal LCx (small vessel). However, difficult to quantify given extensive calcification with possibility of blooming artifact. Will send for FFR. 3.  Pulmonary veins as noted above. 4.  No LA appendage thrombus seen.   Assessment/Plan  79 y.o. Caucasian male  with hypertension, elevated calcium score, nonobstructive CAD, persistent atrial fibrillation   Persistent atrial fibrillation: Tikosyn initiation CHA2DS2VASc score 4, annual stroke risk 5% Continue eliquis 5 mg bid  CAD: Nonobstructive.  LDL 43 with crestor 5 mg. Continue  Hypertension: Controlled  Nigel Mormon, MD 11/28/2019, 12:12 PM [facility] Pager: [phone] Office: [phone] If no answer: [phone]

## 2019-11-28 NOTE — Progress Notes (Addendum)
Pharmacy: Dofetilide (Tikosyn) - Initial Consult Assessment and Electrolyte Replacement  Pharmacy consulted to assist in monitoring and replacing electrolytes in this 79 y.o. male admitted on 11/28/2019 undergoing dofetilide initiation. First dofetilide dose: planned for 6/14 pm  Assessment:  Patient Exclusion Criteria: If any screening criteria checked as "Yes", then  patient  should NOT receive dofetilide until criteria item is corrected.  If "Yes" please indicate correction plan.  YES  NO Patient  Exclusion Criteria Correction Plan   []   [x]   Baseline QTc interval is greater than or equal to 440 msec. IF above YES box checked dofetilide contraindicated unless patient has ICD; then may proceed if QTc 500-550 msec or with known ventricular conduction abnormalities may proceed with QTc 550-600 msec. QTc = 424    []   [x]   Patient is known or suspected to have a digoxin level greater than 2 ng/ml: No results found for: DIGOXIN     []   [x]   Creatinine clearance less than 20 ml/min (calculated using Cockcroft-Gault, actual body weight and serum creatinine): Estimated Creatinine Clearance: 40.6 mL/min (A) (by C-G formula based on SCr of 1.55 mg/dL (H)).     []   [x]  Patient has received drugs known to prolong the QT intervals within the last 48 hours (phenothiazines, tricyclics or tetracyclic antidepressants, erythromycin, H-1 antihistamines, cisapride, fluoroquinolones, azithromycin). Updated information on QT prolonging agents is available to be searched on the following database:QT prolonging agents     []   [x]   Patient received a dose of hydrochlorothiazide (Oretic) alone or in any combination including triamterene (Dyazide, Maxzide) in the last 48 hours.    []   [x]  Patient received a medication known to increase dofetilide plasma concentrations prior to initial dofetilide dose:  . Trimethoprim (Primsol, Proloprim) in the last 36 hours . Verapamil (Calan, Verelan) in the last 36  hours or a sustained release dose in the last 72 hours . Megestrol (Megace) in the last 5 days  . Cimetidine (Tagamet) in the last 6 hours . Ketoconazole (Nizoral) in the last 24 hours . Itraconazole (Sporanox) in the last 48 hours  . Prochlorperazine (Compazine) in the last 36 hours     []   [x]   Patient is known to have a history of torsades de pointes; congenital or acquired long QT syndromes.    []   [x]   Patient has received a Class 1 antiarrhythmic with less than 2 half-lives since last dose. (Disopyramide, Quinidine, Procainamide, Lidocaine, Mexiletine, Flecainide, Propafenone)    []   []   Patient has received amiodarone therapy in the past 3 months or amiodarone level is greater than 0.3 ng/ml.    Patient has been appropriately anticoagulated with apixaban.  Labs:    Component Value Date/Time   K 5.0 11/28/2019 1244   MG 2.3 11/28/2019 1244     Plan: Potassium: K >/= 4: Appropriate to initiate Tikosyn, no replacement needed    Magnesium: Mg >2: Appropriate to initiate Tikosyn, no replacement needed     Thank you for allowing pharmacy to participate in this patient's care   PharmD., BCPS Clinical Pharmacist 11/28/2019 3:30 PM

## 2019-11-29 LAB — BASIC METABOLIC PANEL
Anion gap: 7 (ref 5–15)
BUN: 27 mg/dL — ABNORMAL HIGH (ref 8–23)
CO2: 24 mmol/L (ref 22–32)
Calcium: 8.9 mg/dL (ref 8.9–10.3)
Chloride: 111 mmol/L (ref 98–111)
Creatinine, Ser: 1.49 mg/dL — ABNORMAL HIGH (ref 0.61–1.24)
GFR calc Af Amer: 51 mL/min — ABNORMAL LOW (ref >60)
GFR calc non Af Amer: 44 mL/min — ABNORMAL LOW (ref >60)
Glucose, Bld: 96 mg/dL (ref 70–99)
Potassium: 4.2 mmol/L (ref 3.5–5.1)
Sodium: 142 mmol/L (ref 135–145)

## 2019-11-29 LAB — MAGNESIUM: Magnesium: 2.4 mg/dL (ref 1.7–2.4)

## 2019-11-29 NOTE — Progress Notes (Signed)
Subjective:  Doing well and asymptomatic at rest.   Intake/Output from previous day:  I/O last 3 completed shifts: In: 440 [P.O.:440] Out: -  Total I/O In: 222 [P.O.:222] Out: -   Blood pressure 112/77, pulse (!) 104, temperature 97.9 F (36.6 C), temperature source Oral, resp. rate 12, height 5' 10"  (1.778 m), weight 81.5 kg, SpO2 95 %. Physical Exam Neck:     Vascular: No carotid bruit.  Cardiovascular:     Rate and Rhythm: Normal rate. Rhythm irregular.     Pulses: Normal pulses and intact distal pulses.     Heart sounds: No murmur heard.  No gallop. No S3 or S4 sounds.      Comments: S1 is variable, S2 is normal. No JVD, No leg edema. Pulmonary:     Effort: Pulmonary effort is normal.     Breath sounds: Normal breath sounds.  Abdominal:     General: Bowel sounds are normal.     Palpations: Abdomen is soft.     Lab Results: BMP BNP (last 3 results) No results for input(s): BNP in the last 8760 hours.  ProBNP (last 3 results) No results for input(s): PROBNP in the last 8760 hours. BMP Latest Ref Rng & Units 11/29/2019 11/28/2019 10/11/2019  Glucose 70 - 99 mg/dL 96 126(H) 85  BUN 8 - 23 mg/dL 27(H) 31(H) 31(H)  Creatinine 0.61 - 1.24 mg/dL 1.49(H) 1.55(H) 1.65(H)  BUN/Creat Ratio 10 - 24 - - 19  Sodium 135 - 145 mmol/L 142 138 140  Potassium 3.5 - 5.1 mmol/L 4.2 5.0 5.1  Chloride 98 - 111 mmol/L 111 106 105  CO2 22 - 32 mmol/L 24 26 21   Calcium 8.9 - 10.3 mg/dL 8.9 8.8(L) 9.6   Hepatic Function Latest Ref Rng & Units 12/29/2018 06/04/2017 04/01/2012  Total Protein 6.0 - 8.5 g/dL 6.3 6.7 5.7(L)  Albumin 3.7 - 4.7 g/dL 4.1 4.1 2.9(L)  AST 0 - 40 IU/L 14 19 31   ALT 0 - 44 IU/L 14 25 34  Alk Phosphatase 39 - 117 IU/L 55 64 47  Total Bilirubin 0.0 - 1.2 mg/dL 0.6 0.3 0.5  Bilirubin, Direct 0.00 - 0.40 mg/dL - 0.11 -   CBC Latest Ref Rng & Units 11/28/2019 10/11/2019 05/17/2019  WBC 4.0 - 10.5 K/uL 4.7 6.3 4.6  Hemoglobin 13.0 - 17.0 g/dL 13.7 15.1 14.9  Hematocrit  39 - 52 % 41.6 42.8 44.8  Platelets 150 - 400 K/uL 167 234 183   Lipid Panel     Component Value Date/Time   CHOL 107 07/04/2019 0843   TRIG 64 07/04/2019 0843   HDL 50 07/04/2019 0843   LDLCALC 43 07/04/2019 0843   Imaging: Imaging results have been reviewed  Cardiac Studies:  Echocardiogram 09/29/2019:  Left ventricle cavity is normal in size. Mild concentric hypertrophy of  the left ventricle. Normal global wall motion. Normal LV systolic function  with EF 61%. Indeterminate diastolic filling pattern.  Left atrial cavity is severely dilated.  Moderate (Grade II) mitral regurgitation.  Moderate tricuspid regurgitation. Estimated pulmonary artery systolic  pressure is 34 mmHg.  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   CTA FFR 03/14/2019: FFR 0.9 mid LAD FFR 0.93 distal RCA FFR 0.94 mid LCx IMPRESSION: No evidence for hemodynamically significant stenosis.  CTA 03/14/2019: 1. Coronary artery calcium score 1837 Agatston units. This places the patient in the 86th percentile for age and gender, suggesting high risk for future cardiac events. 2. Extensive calcified plaque throughout  the coronary system. Mild stenosis for the most part, possibly around 50% stenosis in the distal RCA and the proximal LCx (small vessel). However, difficult to quantify given extensive calcification with possibility of blooming artifact. Will send for FFR. 3.  Pulmonary veins as noted above. 4.  No LA appendage thrombus seen.   EKG 11/29/2019: Atrial fibrillation with controlled ventricular response at rate of 90 bpm, left axis deviation.  Poor R wave progression, cannot exclude anteroseptal infarct old.  Diffuse nonspecific ST-T abnormalities.  Normal QT interval.   No results found for this or any previous visit (from the past 43800 hour(s)).  Scheduled Meds: . apixaban  5 mg Oral BID  . diltiazem  120 mg Oral Daily  . dofetilide  250 mcg Oral BID  . lisinopril  5 mg Oral  Daily  . rosuvastatin  5 mg Oral Daily   Continuous Infusions: PRN Meds:.acetaminophen, acetaminophen, ondansetron (ZOFRAN) IV, psyllium  Assessment/Plan:  1.  Symptomatic paroxysmal atrial fibrillation CHA2DS2-VASc Score is 4.  Yearly risk of stroke: 4% (A, HTN, Vasc Dz).  Score of 1=1.3; 2=2.2; 3=3.2; 4=4; 5=6.7; 6=9.8; 7=>9.8) -(CHF; HTN; vasc disease DM,  Male = 1; Age <65 =0; 65-74 = 1,  >75 =2; stroke = 2).   2.  Therapeutic drug monitoring, patient initiated on Tikosyn 3.  Essential hypertension 4.  Chronic stage IIIa kidney disease, stable.  Recommendation: Reviewed the EKG, QT interval is stable.  Continue Tikosyn.  Continue anticoagulation with Eliquis 5 mg p.o. twice daily.  Blood pressures well controlled.  I will schedule him for direct-current cardioversion Thursday morning, he would have received fifth dose of Tikosyn the previous evening if still in atrial fibrillation.  Following which I will plan to discharge him home.   Adrian Prows, M.D. 11/29/2019, 10:53 AM [facility] Pager: [phone] Office: [phone] If no answer: [phone]

## 2019-11-29 NOTE — Care Management (Addendum)
[id number] 11-29-19 Patient presented for Tikosyn Load. Benefits check submitted and Case Manager will follow for cost. Gala Lewandowsky , RN,BSN Case Manager   11-29-19 [phone] Patient uses [facility] [address] in [place]. Case Manager will continue to follow. Gala Lewandowsky, RN,BSN Case Manager

## 2019-11-29 NOTE — Progress Notes (Signed)
Pharmacy: Dofetilide (Tikosyn) - Follow Up Assessment and Electrolyte Replacement  Pharmacy consulted to assist in monitoring and replacing electrolytes in this 79 y.o. male admitted on 11/28/2019 undergoing dofetilide initiation. First dofetilide dose: 11/28/19  Labs:    Component Value Date/Time   K 4.2 11/29/2019 0322   MG 2.4 11/29/2019 0322     Plan: Potassium: K >/= 4: No additional supplementation needed  Magnesium: Mg > 2: No additional supplementation needed  No potassium supplementation has been needed this admit.    Thank you for allowing pharmacy to participate in this patient's care   Sheppard Coil PharmD., BCPS Clinical Pharmacist 11/29/2019 7:20 AM

## 2019-11-29 NOTE — TOC Benefit Eligibility Note (Signed)
Transition of Care [facility]) Benefit Eligibility Note    Patient Details  Name: KANNEN MOXEY MRN: [id number] Date of Birth: 05/13/1941   Medication/Dose: DOFETILIDE   500  MCG  CO-PAY- $ $73.60     DOFETILIDE 250 MCG  CO-PAY- $ 70.46     DOFETILIDE  125 MCG CO-PAY- $ 69.79  Covered?: Yes  Tier:  (TIER- 4 DRUG)  Prescription Coverage Preferred Pharmacy: [facility] with Person/Company/Phone Number:: AMBER  @ [facility] RX # [phone]  Co-Pay: $  73.60  Prior Approval: No  Deductible: Unmet  Additional Notes: TIKOSYN   125 MCG  250 MCG  500 MCG  BID : NOT COVER  P/A-YES # [phone]    Mardene Sayer Phone Number: 11/29/2019, 12:00 PM

## 2019-11-29 NOTE — H&P (View-Only) (Signed)
Subjective:  Doing well and asymptomatic at rest.   Intake/Output from previous day:  I/O last 3 completed shifts: In: 440 [P.O.:440] Out: -  Total I/O In: 222 [P.O.:222] Out: -   Blood pressure 112/77, pulse (!) 104, temperature 97.9 F (36.6 C), temperature source Oral, resp. rate 12, height 5' 10"  (1.778 m), weight 81.5 kg, SpO2 95 %. Physical Exam Neck:     Vascular: No carotid bruit.  Cardiovascular:     Rate and Rhythm: Normal rate. Rhythm irregular.     Pulses: Normal pulses and intact distal pulses.     Heart sounds: No murmur heard.  No gallop. No S3 or S4 sounds.      Comments: S1 is variable, S2 is normal. No JVD, No leg edema. Pulmonary:     Effort: Pulmonary effort is normal.     Breath sounds: Normal breath sounds.  Abdominal:     General: Bowel sounds are normal.     Palpations: Abdomen is soft.     Lab Results: BMP BNP (last 3 results) No results for input(s): BNP in the last 8760 hours.  ProBNP (last 3 results) No results for input(s): PROBNP in the last 8760 hours. BMP Latest Ref Rng & Units 11/29/2019 11/28/2019 10/11/2019  Glucose 70 - 99 mg/dL 96 126(H) 85  BUN 8 - 23 mg/dL 27(H) 31(H) 31(H)  Creatinine 0.61 - 1.24 mg/dL 1.49(H) 1.55(H) 1.65(H)  BUN/Creat Ratio 10 - 24 - - 19  Sodium 135 - 145 mmol/L 142 138 140  Potassium 3.5 - 5.1 mmol/L 4.2 5.0 5.1  Chloride 98 - 111 mmol/L 111 106 105  CO2 22 - 32 mmol/L 24 26 21   Calcium 8.9 - 10.3 mg/dL 8.9 8.8(L) 9.6   Hepatic Function Latest Ref Rng & Units 12/29/2018 06/04/2017 04/01/2012  Total Protein 6.0 - 8.5 g/dL 6.3 6.7 5.7(L)  Albumin 3.7 - 4.7 g/dL 4.1 4.1 2.9(L)  AST 0 - 40 IU/L 14 19 31   ALT 0 - 44 IU/L 14 25 34  Alk Phosphatase 39 - 117 IU/L 55 64 47  Total Bilirubin 0.0 - 1.2 mg/dL 0.6 0.3 0.5  Bilirubin, Direct 0.00 - 0.40 mg/dL - 0.11 -   CBC Latest Ref Rng & Units 11/28/2019 10/11/2019 05/17/2019  WBC 4.0 - 10.5 K/uL 4.7 6.3 4.6  Hemoglobin 13.0 - 17.0 g/dL 13.7 15.1 14.9  Hematocrit  39 - 52 % 41.6 42.8 44.8  Platelets 150 - 400 K/uL 167 234 183   Lipid Panel     Component Value Date/Time   CHOL 107 07/04/2019 0843   TRIG 64 07/04/2019 0843   HDL 50 07/04/2019 0843   LDLCALC 43 07/04/2019 0843   Imaging: Imaging results have been reviewed  Cardiac Studies:  Echocardiogram 09/29/2019:  Left ventricle cavity is normal in size. Mild concentric hypertrophy of  the left ventricle. Normal global wall motion. Normal LV systolic function  with EF 61%. Indeterminate diastolic filling pattern.  Left atrial cavity is severely dilated.  Moderate (Grade II) mitral regurgitation.  Moderate tricuspid regurgitation. Estimated pulmonary artery systolic  pressure is 34 mmHg.  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   CTA FFR 03/14/2019: FFR 0.9 mid LAD FFR 0.93 distal RCA FFR 0.94 mid LCx IMPRESSION: No evidence for hemodynamically significant stenosis.  CTA 03/14/2019: 1. Coronary artery calcium score 1837 Agatston units. This places the patient in the 86th percentile for age and gender, suggesting high risk for future cardiac events. 2. Extensive calcified plaque throughout  the coronary system. Mild stenosis for the most part, possibly around 50% stenosis in the distal RCA and the proximal LCx (small vessel). However, difficult to quantify given extensive calcification with possibility of blooming artifact. Will send for FFR. 3.  Pulmonary veins as noted above. 4.  No LA appendage thrombus seen.   EKG 11/29/2019: Atrial fibrillation with controlled ventricular response at rate of 90 bpm, left axis deviation.  Poor R wave progression, cannot exclude anteroseptal infarct old.  Diffuse nonspecific ST-T abnormalities.  Normal QT interval.   No results found for this or any previous visit (from the past 43800 hour(s)).  Scheduled Meds: . apixaban  5 mg Oral BID  . diltiazem  120 mg Oral Daily  . dofetilide  250 mcg Oral BID  . lisinopril  5 mg Oral  Daily  . rosuvastatin  5 mg Oral Daily   Continuous Infusions: PRN Meds:.acetaminophen, acetaminophen, ondansetron (ZOFRAN) IV, psyllium  Assessment/Plan:  1.  Symptomatic paroxysmal atrial fibrillation CHA2DS2-VASc Score is 4.  Yearly risk of stroke: 4% (A, HTN, Vasc Dz).  Score of 1=1.3; 2=2.2; 3=3.2; 4=4; 5=6.7; 6=9.8; 7=>9.8) -(CHF; HTN; vasc disease DM,  Male = 1; Age <65 =0; 65-74 = 1,  >75 =2; stroke = 2).   2.  Therapeutic drug monitoring, patient initiated on Tikosyn 3.  Essential hypertension 4.  Chronic stage IIIa kidney disease, stable.  Recommendation: Reviewed the EKG, QT interval is stable.  Continue Tikosyn.  Continue anticoagulation with Eliquis 5 mg p.o. twice daily.  Blood pressures well controlled.  I will schedule him for direct-current cardioversion Thursday morning, he would have received fifth dose of Tikosyn the previous evening if still in atrial fibrillation.  Following which I will plan to discharge him home.   Adrian Prows, M.D. 11/29/2019, 10:53 AM [facility] Pager: [phone] Office: [phone] If no answer: [phone]

## 2019-11-30 LAB — BASIC METABOLIC PANEL
Anion gap: 10 (ref 5–15)
BUN: 28 mg/dL — ABNORMAL HIGH (ref 8–23)
CO2: 24 mmol/L (ref 22–32)
Calcium: 9 mg/dL (ref 8.9–10.3)
Chloride: 107 mmol/L (ref 98–111)
Creatinine, Ser: 1.44 mg/dL — ABNORMAL HIGH (ref 0.61–1.24)
GFR calc Af Amer: 54 mL/min — ABNORMAL LOW (ref >60)
GFR calc non Af Amer: 46 mL/min — ABNORMAL LOW (ref >60)
Glucose, Bld: 104 mg/dL — ABNORMAL HIGH (ref 70–99)
Potassium: 4.2 mmol/L (ref 3.5–5.1)
Sodium: 141 mmol/L (ref 135–145)

## 2019-11-30 LAB — MAGNESIUM: Magnesium: 2.2 mg/dL (ref 1.7–2.4)

## 2019-11-30 NOTE — Anesthesia Preprocedure Evaluation (Addendum)
Anesthesia Evaluation  Patient identified by MRN, date of birth, ID band Patient awake    Airway Mallampati: II  TM Distance: >3 FB     Dental   Pulmonary    breath sounds clear to auscultation       Cardiovascular hypertension, + dysrhythmias  Rhythm:Regular Rate:Normal     Neuro/Psych  Neuromuscular disease    GI/Hepatic   Endo/Other    Renal/GU Renal disease     Musculoskeletal   Abdominal   Peds  Hematology   Anesthesia Other Findings   Reproductive/Obstetrics                            Anesthesia Physical Anesthesia Plan  ASA: III  Anesthesia Plan: General   Post-op Pain Management:    Induction:   PONV Risk Score and Plan: 2 and Propofol infusion  Airway Management Planned: Nasal Cannula and Simple Face Mask  Additional Equipment:   Intra-op Plan:   Post-operative Plan:   Informed Consent: I have reviewed the patients History and Physical, chart, labs and discussed the procedure including the risks, benefits and alternatives for the proposed anesthesia with the patient or authorized representative who has indicated his/her understanding and acceptance.     Dental advisory given  Plan Discussed with: CRNA and Anesthesiologist  Anesthesia Plan Comments:        Anesthesia Quick Evaluation

## 2019-11-30 NOTE — Care Management (Signed)
11-30-19 1517 Case Manager called [facility] and Joice Lofts is in stock-generic. No further needs from Case Manager at this time. Gala Lewandowsky, RN,BSN Case Manager

## 2019-11-30 NOTE — Progress Notes (Signed)
Pharmacy: Dofetilide (Tikosyn) - Follow Up Assessment and Electrolyte Replacement  Pharmacy consulted to assist in monitoring and replacing electrolytes in this 79 y.o. male admitted on 11/28/2019 undergoing dofetilide initiation. First dofetilide dose: 11/28/19  Labs:    Component Value Date/Time   K 4.2 11/30/2019 0402   MG 2.2 11/30/2019 0402     Plan: Potassium: K >/= 4: No additional supplementation needed  Magnesium: Mg > 2: No additional supplementation needed  No potassium supplementation has been needed this admit.   Thank you for allowing pharmacy to participate in this patient's care   Sheppard Coil PharmD., BCPS Clinical Pharmacist 11/30/2019 7:45 AM

## 2019-11-30 NOTE — Progress Notes (Signed)
Subjective:  No complaints  Objective:  Vital Signs in the last 24 hours: Temp:  [97.6 F (36.4 C)-97.9 F (36.6 C)] 97.8 F (36.6 C) (06/16 0653) Pulse Rate:  [63-90] 63 (06/16 0653) Resp:  [12-20] 20 (06/16 0653) BP: (108-123)/(76-84) 123/84 (06/16 0653) SpO2:  [95 %-100 %] 98 % (06/16 0653) Weight:  [81.4 kg] 81.4 kg (06/16 0653)  Intake/Output from previous day: 06/15 0701 - 06/16 0700 In: 700 [P.O.:700] Out: -   Physical Exam Vitals and nursing note reviewed.  Constitutional:      General: He is not in acute distress.    Appearance: He is well-developed.  HENT:     Head: Normocephalic and atraumatic.  Eyes:     Conjunctiva/sclera: Conjunctivae normal.     Pupils: Pupils are equal, round, and reactive to light.  Neck:     Vascular: No JVD.  Cardiovascular:     Rate and Rhythm: Normal rate and regular rhythm.     Pulses: Normal pulses and intact distal pulses.     Heart sounds: No murmur heard.   Pulmonary:     Effort: Pulmonary effort is normal.     Breath sounds: Normal breath sounds. No wheezing or rales.  Abdominal:     General: Bowel sounds are normal.     Palpations: Abdomen is soft.     Tenderness: There is no rebound.  Musculoskeletal:        General: No tenderness. Normal range of motion.     Left lower leg: No edema.  Lymphadenopathy:     Cervical: No cervical adenopathy.  Skin:    General: Skin is warm and dry.  Neurological:     Mental Status: He is alert and oriented to person, place, and time.     Cranial Nerves: No cranial nerve deficit.      Lab Results: BMP Recent Labs    11/28/19 1244 11/29/19 0322 11/30/19 0402  NA 138 142 141  K 5.0 4.2 4.2  CL 106 111 107  CO2 26 24 24   GLUCOSE 126* 96 104*  BUN 31* 27* 28*  CREATININE 1.55* 1.49* 1.44*  CALCIUM 8.8* 8.9 9.0  GFRNONAA 42* 44* 46*  GFRAA 49* 51* 54*    CBC Recent Labs  Lab 11/28/19 1210  WBC 4.7  RBC 4.15*  HGB 13.7  HCT 41.6  PLT 167  MCV 100.2*  MCH 33.0   MCHC 32.9  RDW 14.0  LYMPHSABS 1.2  MONOABS 0.4  EOSABS 0.1  BASOSABS 0.0     Lipid Panel     Component Value Date/Time   CHOL 107 07/04/2019 0843   TRIG 64 07/04/2019 0843   HDL 50 07/04/2019 0843   LDLCALC 43 07/04/2019 0843     Hepatic Function Panel Recent Labs    12/29/18 0816  PROT 6.3  ALBUMIN 4.1  AST 14  ALT 14  ALKPHOS 55  BILITOT 0.6    Cardiac Studies:  EKG 11/29/2019: Afib 82 bpm. QTc 436 msec  EKG 11/28/2019: Afib 103 bpm Nonspecific ST-T changes QTc 424 msec  Echocardiogram 09/29/2019:  Left ventricle cavity is normal in size. Mild concentric hypertrophy of  the left ventricle. Normal global wall motion. Normal LV systolic function  with EF 61%. Indeterminate diastolic filling pattern.  Left atrial cavity is severely dilated.  Moderate (Grade II) mitral regurgitation.  Moderate tricuspid regurgitation. Estimated pulmonary artery systolic  pressure is 34 mmHg.  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   CTA  FFR 03/14/2019: FFR 0.9 mid LAD FFR 0.93 distal RCA FFR 0.94 mid LCx IMPRESSION: No evidence for hemodynamically significant stenosis.  CTA 03/14/2019: 1. Coronary artery calcium score 1837 Agatston units. This places the patient in the 86th percentile for age and gender, suggesting high risk for future cardiac events. 2. Extensive calcified plaque throughout the coronary system. Mild stenosis for the most part, possibly around 50% stenosis in the distal RCA and the proximal LCx (small vessel). However, difficult to quantify given extensive calcification with possibility of blooming artifact. Will send for FFR. 3. Pulmonary veins as noted above. 4. No LA appendage thrombus seen.  Assessment & Recommendations:  79 y.o. Caucasian male  with hypertension, elevated calcium score, nonobstructive CAD, CKD 3, persistent atrial fibrillation   Persistent atrial fibrillation: Continue Tikosyn 250 mcg bid. QTc  acceptable. Plan for cardioversion 12/01/19 7:30 AM Rate faster this morning, but otherwise well controlled. Continue diltiazem 120 mg. CHA2DS2VASc score 4, annual stroke risk 5% Continue eliquis 5 mg bid  CAD: Nonobstructive.  LDL 43 with crestor 5 mg. Continue  Hypertension: Controlled   Nigel Mormon, M.D. [facility], [place] Pager: [phone] Office: [phone]

## 2019-12-01 ENCOUNTER — Encounter (HOSPITAL_COMMUNITY): Payer: Self-pay | Admitting: Cardiology

## 2019-12-01 ENCOUNTER — Telehealth: Payer: Self-pay

## 2019-12-01 ENCOUNTER — Inpatient Hospital Stay (HOSPITAL_COMMUNITY): Payer: Medicare Other | Admitting: Certified Registered Nurse Anesthetist

## 2019-12-01 ENCOUNTER — Encounter (HOSPITAL_COMMUNITY)
Admission: RE | Disposition: A | Discharge status: Home / Self Care | Payer: Self-pay | Source: Ambulatory Visit | Attending: Cardiology

## 2019-12-01 HISTORY — PX: CARDIOVERSION: SHX1299

## 2019-12-01 LAB — BASIC METABOLIC PANEL
Anion gap: 9 (ref 5–15)
BUN: 40 mg/dL — ABNORMAL HIGH (ref 8–23)
CO2: 21 mmol/L — ABNORMAL LOW (ref 22–32)
Calcium: 8.9 mg/dL (ref 8.9–10.3)
Chloride: 109 mmol/L (ref 98–111)
Creatinine, Ser: 1.54 mg/dL — ABNORMAL HIGH (ref 0.61–1.24)
GFR calc Af Amer: 49 mL/min — ABNORMAL LOW (ref >60)
GFR calc non Af Amer: 43 mL/min — ABNORMAL LOW (ref >60)
Glucose, Bld: 102 mg/dL — ABNORMAL HIGH (ref 70–99)
Potassium: 4.3 mmol/L (ref 3.5–5.1)
Sodium: 139 mmol/L (ref 135–145)

## 2019-12-01 LAB — MAGNESIUM: Magnesium: 2.1 mg/dL (ref 1.7–2.4)

## 2019-12-01 SURGERY — CARDIOVERSION
Anesthesia: General

## 2019-12-01 MED ORDER — DOFETILIDE 250 MCG PO CAPS: 250.0000 ug | ORAL_CAPSULE | Freq: Two times a day (BID) | ORAL | 3 refills | Status: DC

## 2019-12-01 MED ORDER — PROPOFOL 10 MG/ML IV BOLUS
INTRAVENOUS | Status: DC | PRN
  Administered 2019-12-01: 70 mg via INTRAVENOUS

## 2019-12-01 MED ORDER — SODIUM CHLORIDE 0.9 % IV SOLN: INTRAVENOUS | Status: DC

## 2019-12-01 MED ORDER — DOFETILIDE 250 MCG PO CAPS: 250.0000 ug | ORAL_CAPSULE | Freq: Two times a day (BID) | ORAL | Status: DC

## 2019-12-01 MED ORDER — MAGNESIUM OXIDE 250 MG PO TABS: 1.0000 | ORAL_TABLET | Freq: Every day | ORAL | 3 refills | Status: DC

## 2019-12-01 MED ORDER — LIDOCAINE 2% (20 MG/ML) 5 ML SYRINGE
INTRAMUSCULAR | Status: DC | PRN
Start: 2019-12-01 — End: 2019-12-01
  Administered 2019-12-01: 60 mg via INTRAVENOUS

## 2019-12-01 MED FILL — DOFETILIDE 250 MCG CAPS: 250 | 30 days supply | Qty: 60 | Fill #0

## 2019-12-01 MED FILL — MAGNESIUM OXIDE 400 MG TABS: 400 | 90 days supply | Qty: 45 | Fill #0

## 2019-12-01 NOTE — Progress Notes (Signed)
Pharmacy: Dofetilide (Tikosyn) - Follow Up Assessment and Electrolyte Replacement  Pharmacy consulted to assist in monitoring and replacing electrolytes in this 79 y.o. male admitted on 11/28/2019 undergoing dofetilide initiation. First dofetilide dose: 11/28/19 Plan for DCCV 7/17 if patient does not convert to SR Continue apixaban 5mg  BID for anticoagulation CBC ok   Labs:    Component Value Date/Time   K 4.3 12/01/2019 0317   MG 2.1 12/01/2019 0317     Plan: Potassium: K >/= 4: No additional supplementation needed  Magnesium: Mg > 2: No additional supplementation needed  No potassium supplementation has been needed this admit.   12/03/2019 Pharm.D. CPP, BCPS Clinical Pharmacist [phone] 12/01/2019 6:33 AM

## 2019-12-01 NOTE — Discharge Instructions (Addendum)
Take only one Dofetilide twice daily. I have sent two Rx one to [facility] pharmacy and [facility] to [facility]. [facility] will bring you one week worth of medication.   JG

## 2019-12-01 NOTE — Interval H&P Note (Signed)
History and Physical Interval Note:  12/01/2019 7:41 AM  Gregory [name]  has presented today for surgery, with the diagnosis of afib.  The various methods of treatment have been discussed with the patient and family. After consideration of risks, benefits and other options for treatment, the patient has consented to  Procedure(s): CARDIOVERSION (N/A) as a surgical intervention.  The patient's history has been reviewed, patient examined, no change in status, stable for surgery.  I have reviewed the patient's chart and labs.  Questions were answered to the patient's satisfaction.     [name]

## 2019-12-01 NOTE — Plan of Care (Signed)
  Problem: Education: Goal: Knowledge of General Education information will improve Description: Including pain rating scale, medication(s)/side effects and non-pharmacologic comfort measures Outcome: Adequate for Discharge   Problem: Health Behavior/Discharge Planning: Goal: Ability to manage health-related needs will improve Outcome: Adequate for Discharge   Problem: Clinical Measurements: Goal: Ability to maintain clinical measurements within normal limits will improve Outcome: Adequate for Discharge Goal: Will remain free from infection Outcome: Adequate for Discharge Goal: Diagnostic test results will improve Outcome: Adequate for Discharge Goal: Respiratory complications will improve Outcome: Adequate for Discharge Goal: Cardiovascular complication will be avoided Outcome: Adequate for Discharge   Problem: Clinical Measurements: Goal: Ability to maintain clinical measurements within normal limits will improve Outcome: Adequate for Discharge Goal: Will remain free from infection Outcome: Adequate for Discharge Goal: Diagnostic test results will improve Outcome: Adequate for Discharge Goal: Respiratory complications will improve Outcome: Adequate for Discharge Goal: Cardiovascular complication will be avoided Outcome: Adequate for Discharge   Problem: Clinical Measurements: Goal: Ability to maintain clinical measurements within normal limits will improve Outcome: Adequate for Discharge Goal: Will remain free from infection Outcome: Adequate for Discharge Goal: Diagnostic test results will improve Outcome: Adequate for Discharge Goal: Respiratory complications will improve Outcome: Adequate for Discharge Goal: Cardiovascular complication will be avoided Outcome: Adequate for Discharge

## 2019-12-01 NOTE — CV Procedure (Signed)
Direct current cardioversion:  Indication symptomatic A. Fibrillation.  Procedure: Using 70 mg of IV Propofol and 60 IV Lidocaine (for reducing venous pain) for achieving deep sedation, synchronized direct current cardioversion performed. Patient was delivered with 120 Joules of electricity X 1 with success to NSR. Patient tolerated the procedure well. No immediate complication noted.   [name], MD, [facility] 12/01/2019, 7:46 AM [facility]. [place] Office: [phone]

## 2019-12-01 NOTE — Plan of Care (Signed)
  Problem: Clinical Measurements: Goal: Ability to maintain clinical measurements within normal limits will improve Outcome: Adequate for Discharge Goal: Will remain free from infection Outcome: Adequate for Discharge Goal: Diagnostic test results will improve Outcome: Adequate for Discharge Goal: Respiratory complications will improve Outcome: Adequate for Discharge Goal: Cardiovascular complication will be avoided Outcome: Adequate for Discharge   

## 2019-12-01 NOTE — Transfer of Care (Signed)
Immediate Anesthesia Transfer of Care Note  Patient: Gregory [name]  Procedure(s) Performed: CARDIOVERSION (N/A )  Patient Location: PACU and Endoscopy Unit  Anesthesia Type:General  Level of Consciousness: awake and drowsy  Airway & Oxygen Therapy: Patient Spontanous Breathing  Post-op Assessment: Report given to RN and Post -op Vital signs reviewed and stable  Post vital signs: Reviewed and stable  Last Vitals:  Vitals Value Taken Time  BP 90/62 12/01/19 0747  Temp    Pulse 56 12/01/19 0749  Resp 18 12/01/19 0749  SpO2 96 % 12/01/19 0749    Last Pain:  Vitals:   12/01/19 0657  TempSrc: Oral  PainSc: 0-No pain      Patients Stated Pain Goal: 0 (11/30/19 1957)  Complications: No complications documented.

## 2019-12-01 NOTE — Telephone Encounter (Signed)
Patient called, stated that he did not have any of his medication from the hospital, then said "nevermind", and hung up.

## 2019-12-01 NOTE — Discharge Summary (Signed)
Physician Discharge Summary  Patient ID: Gregory [name] MRN: [id number] DOB/AGE: 02-14-1941 79 y.o.  Admit date: 11/28/2019 Discharge date: 12/01/2019  Primary Discharge Diagnosis 1. Symptomatic paroxysmal atrial fibrillation CHA2DS2-VASc Score is 4.  Yearly risk of stroke: 4% (A, HTN, Vasc Dz).  Score of 1=1.3; 2=2.2; 3=3.2; 4=4; 5=6.7; 6=9.8; 7=>9.8) 2.  Essential hypertension 3.  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  Significant Diagnostic Studies: None during the hospitalization.   EKG 12/01/2019: Sinus rhythm with first-degree AV block, normal QT interval.  No evidence of ischemia.  Hospital Course: Gregory [name] is a 79 y.o. Caucasian male  with hypertension, elevated calcium score, nonobstructive CAD, persistent atrial fibrillation   Patient has long history of Afib, with multiple prior cardioversion, and ablation X2. He now has had recurrence of Afib. He was seen by EP Dr. Elberta Fortis. Ablation was not thought be a good option in this setting, and dofetilide initiation was recommended.   He has no specific complaints at this time. He has been fully vaccinated against COVID.  He was admitted for therapeutic drug administration with Tikosyn, patient continued to have episodes of atrial fibrillation without conversion to sinus rhythm, and in view of persistent atrial fibrillation in spite of having received 6 doses of Tikosyn, he was scheduled for direct-current cardioversion today on 12/01/2019, patient received 120 J of synchronized current with successful conversion to sinus rhythm with first-degree AV block.  Recommendations on discharge: Patient will be discharged home on Tikosyn 250 mcg p.o. twice daily, 1 weeks worth of medications was sent to transitional care pharmacy and long-term sent to his routine pharmacy at [facility]  Discharge Exam: Blood pressure 101/71, pulse (!) 56, temperature (!) 96.8 F (36 C), temperature source Temporal, resp. rate  16, height 5\' 10"  (1.778 m), weight 81.5 kg, SpO2 96 %.   Physical Exam Neck:     Vascular: No carotid bruit.  Cardiovascular:     Rate and Rhythm: Normal rate and regular rhythm.     Pulses: Normal pulses and intact distal pulses.     Heart sounds: Normal heart sounds. No murmur heard.  No gallop. No S3 or S4 sounds.      Comments: No JVD, No leg edema. Pulmonary:     Effort: Pulmonary effort is normal.     Breath sounds: Normal breath sounds.  Abdominal:     General: Bowel sounds are normal.     Palpations: Abdomen is soft.    Labs:   Lab Results  Component Value Date   WBC 4.7 11/28/2019   HGB 13.7 11/28/2019   HCT 41.6 11/28/2019   MCV 100.2 (H) 11/28/2019   PLT 167 11/28/2019    Recent Labs  Lab 12/01/19 0317  NA 139  K 4.3  CL 109  CO2 21*  BUN 40*  CREATININE 1.54*  CALCIUM 8.9  GLUCOSE 102*    Lipid Panel     Component Value Date/Time   CHOL 107 07/04/2019 0843   TRIG 64 07/04/2019 0843   HDL 50 07/04/2019 0843   LDLCALC 43 07/04/2019 0843    BNP (last 3 results) No results for input(s): BNP in the last 8760 hours.  HEMOGLOBIN A1C No results found for: HGBA1C, MPG  Cardiac Panel (last 3 results) No results for input(s): CKTOTAL, CKMB, TROPONINI, RELINDX in the last 8760 hours.  No results found for: CKTOTAL, CKMB, CKMBINDEX, TROPONINI   TSH No results for input(s): TSH in the last 8760 hours.  Radiology: No results found.  FOLLOW UP PLANS AND APPOINTMENTS  Allergies as of 12/01/2019   No Known Allergies     Medication List    TAKE these medications   acetaminophen 500 MG tablet Commonly known as: TYLENOL Take 1,000 mg by mouth every 6 (six) hours as needed for moderate pain or headache.   apixaban 5 MG Tabs tablet Commonly known as: Eliquis Take 1 tablet (5 mg total) by mouth 2 (two) times daily.   diltiazem 120 MG 24 hr capsule Commonly known as: TIAZAC Take 1 capsule (120 mg total) by mouth daily.   dofetilide 250 MCG  capsule Commonly known as: TIKOSYN Take 1 capsule (250 mcg total) by mouth 2 (two) times daily.   dofetilide 250 MCG capsule Commonly known as: TIKOSYN Take 1 capsule (250 mcg total) by mouth 2 (two) times daily. Start taking on: December 08, 2019   lisinopril 5 MG tablet Commonly known as: ZESTRIL Take 1 tablet (5 mg total) by mouth daily. What changed: when to take this   Magnesium Oxide 250 MG Tabs Take 1 tablet (250 mg total) by mouth daily.   psyllium 58.6 % packet Commonly known as: METAMUCIL Take 1 packet by mouth daily as needed (constipation).   rosuvastatin 5 MG tablet Commonly known as: CRESTOR Take 1 tablet (5 mg total) by mouth daily.       Follow-up Information    Adrian Prows, MD Follow up on 12/05/2019.   Specialty: Cardiology Why: For A. Fib follow up 1 pm Contact information: [address] [place] [address] [phone]               Adrian Prows, MD 12/01/2019, 8:36 AM  Pager: [phone] Office: [phone] If no answer: [phone]

## 2019-12-01 NOTE — Progress Notes (Signed)
Nsg Discharge Note  Admit Date:  11/28/2019 Discharge date: 12/01/2019   SPIKE DESILETS to be D/C'd  per MD order.  AVS completed.   Patient/caregiver able to verbalize understanding.  Discharge Medication: Allergies as of 12/01/2019   No Known Allergies     Medication List    TAKE these medications   acetaminophen 500 MG tablet Commonly known as: TYLENOL Take 1,000 mg by mouth every 6 (six) hours as needed for moderate pain or headache.   apixaban 5 MG Tabs tablet Commonly known as: Eliquis Take 1 tablet (5 mg total) by mouth 2 (two) times daily.   diltiazem 120 MG 24 hr capsule Commonly known as: TIAZAC Take 1 capsule (120 mg total) by mouth daily.   dofetilide 250 MCG capsule Commonly known as: TIKOSYN Take 1 capsule (250 mcg total) by mouth 2 (two) times daily.   dofetilide 250 MCG capsule Commonly known as: TIKOSYN Take 1 capsule (250 mcg total) by mouth 2 (two) times daily. Start taking on: December 08, 2019   lisinopril 5 MG tablet Commonly known as: ZESTRIL Take 1 tablet (5 mg total) by mouth daily. What changed: when to take this Notes to patient: Nest Dose 12/01/19   Magnesium Oxide 250 MG Tabs Take 1 tablet (250 mg total) by mouth daily.   psyllium 58.6 % packet Commonly known as: METAMUCIL Take 1 packet by mouth daily as needed (constipation).   rosuvastatin 5 MG tablet Commonly known as: CRESTOR Take 1 tablet (5 mg total) by mouth daily.       Discharge Assessment: Vitals:   12/01/19 0755 12/01/19 0805  BP: 92/67 101/71  Pulse: (!) 59 (!) 56  Resp: (!) 21 16  Temp:    SpO2: 94% 96%   Skin clean, dry and intact without evidence of skin break down, no evidence of skin tears noted. IV catheter discontinued intact. Site without signs and symptoms of complications - no redness or edema noted at insertion site, patient denies c/o pain - only slight tenderness at site.  Dressing with slight pressure applied.  D/c Instructions-Education: Discharge  instructions given to patient/family with verbalized understanding. D/c education completed with patient/family including follow up instructions, medication list, d/c activities limitations if indicated, with other d/c instructions as indicated by MD - patient able to verbalize understanding, all questions fully answered. Patient instructed to return to ED, call 911, or call MD for any changes in condition.  Patient escorted via WC, and D/C home via private auto.  Taji Sather, Tilford Pillar, RN 12/01/2019 10:09 AM

## 2019-12-01 NOTE — Progress Notes (Signed)
ekg completed

## 2019-12-01 NOTE — Anesthesia Postprocedure Evaluation (Signed)
Anesthesia Post Note  Patient: Gregory [name]  Procedure(s) Performed: CARDIOVERSION (N/A )     Patient location during evaluation: Endoscopy Anesthesia Type: General Level of consciousness: awake Pain management: pain level controlled Respiratory status: spontaneous breathing Cardiovascular status: stable Postop Assessment: no apparent nausea or vomiting Anesthetic complications: no   No complications documented.  Last Vitals:  Vitals:   12/01/19 0755 12/01/19 0805  BP: 92/67 101/71  Pulse: (!) 59 (!) 56  Resp: (!) 21 16  Temp:    SpO2: 94% 96%    Last Pain:  Vitals:   12/01/19 0805  TempSrc:   PainSc: 0-No pain                 Rettie Laird

## 2019-12-05 ENCOUNTER — Ambulatory Visit: Payer: Medicare Other | Admitting: Cardiology

## 2019-12-13 DIAGNOSIS — N400 Enlarged prostate without lower urinary tract symptoms: Secondary | ICD-10-CM | POA: Insufficient documentation

## 2019-12-13 DIAGNOSIS — Z0000 Encounter for general adult medical examination without abnormal findings: Secondary | ICD-10-CM | POA: Insufficient documentation

## 2019-12-13 DIAGNOSIS — L309 Dermatitis, unspecified: Secondary | ICD-10-CM | POA: Insufficient documentation

## 2019-12-28 ENCOUNTER — Ambulatory Visit: Payer: Medicare Other | Admitting: Cardiology

## 2019-12-28 ENCOUNTER — Encounter: Payer: Self-pay | Admitting: Cardiology

## 2019-12-28 ENCOUNTER — Other Ambulatory Visit: Payer: Self-pay

## 2019-12-28 VITALS — BP 115/74 | HR 66 | Resp 16 | Ht 70.0 in | Wt 185.0 lb

## 2019-12-28 DIAGNOSIS — Z5181 Encounter for therapeutic drug level monitoring: Secondary | ICD-10-CM

## 2019-12-28 DIAGNOSIS — I480 Paroxysmal atrial fibrillation: Secondary | ICD-10-CM

## 2019-12-28 DIAGNOSIS — Z79899 Other long term (current) drug therapy: Secondary | ICD-10-CM

## 2019-12-28 DIAGNOSIS — I10 Essential (primary) hypertension: Secondary | ICD-10-CM

## 2019-12-28 DIAGNOSIS — N1832 Chronic kidney disease, stage 3b: Secondary | ICD-10-CM

## 2019-12-28 NOTE — Progress Notes (Signed)
Primary Physician/Referring:  Christain Sacramento, MD  Patient ID: Gregory [name], male    DOB: 08-03-1940, 79 y.o.   MRN: [id number]  Chief Complaint  Patient presents with  . Atrial Fibrillation  . Transitions Of Care  . Follow-up    Cardioversion   HPI:    Gregory [name]  is a 79 y.o. Caucasian male with history of stage 3b CKD, hypertension, mild hyperlipidemia and moderate CAD by coronary CTA, paroxysmal atrial fibrillation. He has history of multiple cardioversions and eventually dofetilide was discontinued by Dr. [name] due to efficacy, and was placed on Multaq and has maintained sinus since Dec 2018. Due to recurrence of atrial fibrillation, he underwent 2nd ablation on 02/22/2019, 1st ablation being on 11/28/2016.  He had repeat cardioversion on 05/24/2019.   I had admitted the patient on 11/28/2019 for initiation of Tikosyn, discharged home on 12/01/2019, he also underwent direct-current cardioversion on the day of discharge and converted to sinus rhythm with 120 J x 1 shocking.  He now presents for a 4-week office visit.  He is tolerating Tikosyn without any side effects.  He has not had any recurrence of palpitations and does not think he has been back in A. fib.  Past Medical History:  Diagnosis Date  . Arthritis    "right knee" (01/28/2016)  . Diverticulosis of colon (without mention of hemorrhage)   . Dysrhythmia   . Internal hemorrhoids without mention of complication   . Personal history of colonic polyps 01/17/2002   adenomatous, tublar adenoma 02/10/2007  . PNA (pneumonia) 03/31/2012   Past Surgical History:  Procedure Laterality Date  . ATRIAL FIBRILLATION ABLATION  11/28/2016  . ATRIAL FIBRILLATION ABLATION N/A 11/28/2016   Procedure: Atrial Fibrillation Ablation;  Surgeon: Constance Haw, MD;  Location: [place] [facility];  Service: Cardiovascular;  Laterality: N/A;  . ATRIAL FIBRILLATION ABLATION N/A 03/18/2019   Procedure: ATRIAL FIBRILLATION ABLATION;   Surgeon: Constance Haw, MD;  Location: [place] [facility];  Service: Cardiovascular;  Laterality: N/A;  . CARDIOVERSION  05/03/2012   Procedure: CARDIOVERSION;  Surgeon: Laverda Page, MD;  Location: [facility] END[facility];  Service: Cardiovascular;  Laterality: N/A;  . CARDIOVERSION  06/08/2012   Procedure: CARDIOVERSION;  Surgeon: Laverda Page, MD;  Location: [place];  Service: Cardiovascular;  Laterality: N/A;  [name]  . CARDIOVERSION N/A 10/28/2013   Procedure: CARDIOVERSION;  Surgeon: Laverda Page, MD;  Location: [place];  Service: Cardiovascular;  Laterality: N/A;  . CARDIOVERSION N/A 06/30/2014   Procedure: CARDIOVERSION;  Surgeon: Laverda Page, MD;  Location: [place];  Service: Cardiovascular;  Laterality: N/A;  H&P in file  . CARDIOVERSION N/A 09/05/2014   Procedure: CARDIOVERSION;  Surgeon: Adrian Prows, MD;  Location: [place];  Service: Cardiovascular;  Laterality: N/A;  . CARDIOVERSION N/A 12/24/2015   Procedure: CARDIOVERSION;  Surgeon: Adrian Prows, MD;  Location: [place];  Service: Cardiovascular;  Laterality: N/A;  . CARDIOVERSION N/A 01/30/2016   Procedure: CARDIOVERSION;  Surgeon: Adrian Prows, MD;  Location: [facility] ENDOSCOPY;  Service: Cardiovascular;  Laterality: N/A;  . CARDIOVERSION N/A 02/08/2016   Procedure: CARDIOVERSION;  Surgeon: Adrian Prows, MD;  Location: [facility] ENDOSCOPY;  Service: Cardiovascular;  Laterality: N/A;  . CARDIOVERSION N/A 02/08/2019   Procedure: CARDIOVERSION;  Surgeon: Adrian Prows, MD;  Location: [facility] ENDOSCOPY;  Service: Cardiovascular;  Laterality: N/A;  . CARDIOVERSION N/A 05/24/2019   Procedure: CARDIOVERSION;  Surgeon: Elouise Munroe, MD;  Location: [place];  Service: Cardiovascular;  Laterality:  N/A;  . CARDIOVERSION N/A 09/21/2019   Procedure: CARDIOVERSION;  Surgeon: Adrian Prows, MD;  Location: [facility] ENDOSCOPY;  Service: Cardiovascular;  Laterality: N/A;  . CARDIOVERSION N/A 12/01/2019   Procedure: CARDIOVERSION;   Surgeon: Adrian Prows, MD;  Location: [place];  Service: Cardiovascular;  Laterality: N/A;  . CATARACT EXTRACTION W/ INTRAOCULAR LENS  IMPLANT, BILATERAL Bilateral   . COLONOSCOPY    . INGUINAL HERNIA REPAIR Bilateral    as child  . KNEE ARTHROSCOPY Right 2004   Family History  Problem Relation Age of Onset  . Heart disease Father   . Stroke Father   . Cancer Mother        bladder  . Colon cancer Neg Hx   . Stomach cancer Neg Hx     Social History   Tobacco Use  . Smoking status: Never Smoker  . Smokeless tobacco: Never Used  Substance Use Topics  . Alcohol use: Yes    Alcohol/week: 1.0 standard drink    Types: 1 Glasses of wine per week    Comment: daily   Marital Status: Married  ROS  Review of Systems  Constitutional: Negative for weight gain.  Cardiovascular: Negative for dyspnea on exertion, leg swelling and syncope.  Respiratory: Negative for shortness of breath.   Musculoskeletal: Negative for joint swelling.   Objective  Blood pressure 115/74, pulse 66, resp. rate 16, height 5' 10"  (1.778 m), weight 185 lb (83.9 kg), SpO2 96 %.  Vitals with BMI 12/28/2019 12/01/2019 12/01/2019  Height 5' 10"  - -  Weight 185 lbs - -  BMI 74.08 - -  Systolic 144 818 92  Diastolic 74 71 67  Pulse 66 56 59     Physical Exam Constitutional:      General: He is not in acute distress.    Appearance: He is well-developed.  Cardiovascular:     Rate and Rhythm: Normal rate and regular rhythm.     Pulses: Intact distal pulses.     Heart sounds: No murmur heard.  No gallop.      Comments: No leg edema. No JVD.   Pulmonary:     Effort: Pulmonary effort is normal. No accessory muscle usage or respiratory distress.     Breath sounds: Normal breath sounds.  Abdominal:     Palpations: Abdomen is soft.    Laboratory examination:   Recent Labs    11/29/19 0322 11/30/19 0402 12/01/19 0317  NA 142 141 139  K 4.2 4.2 4.3  CL 111 107 109  CO2 24 24 21*  GLUCOSE 96 104* 102*   BUN 27* 28* 40*  CREATININE 1.49* 1.44* 1.54*  CALCIUM 8.9 9.0 8.9  GFRNONAA 44* 46* 43*  GFRAA 51* 54* 49*   CrCl cannot be calculated (Patient's most recent lab result is older than the maximum 21 days allowed.).  CMP Latest Ref Rng & Units 12/01/2019 11/30/2019 11/29/2019  Glucose 70 - 99 mg/dL 102(H) 104(H) 96  BUN 8 - 23 mg/dL 40(H) 28(H) 27(H)  Creatinine 0.61 - 1.24 mg/dL 1.54(H) 1.44(H) 1.49(H)  Sodium 135 - 145 mmol/L 139 141 142  Potassium 3.5 - 5.1 mmol/L 4.3 4.2 4.2  Chloride 98 - 111 mmol/L 109 107 111  CO2 22 - 32 mmol/L 21(L) 24 24  Calcium 8.9 - 10.3 mg/dL 8.9 9.0 8.9  Total Protein 6.0 - 8.5 g/dL - - -  Total Bilirubin 0.0 - 1.2 mg/dL - - -  Alkaline Phos 39 - 117 IU/L - - -  AST  0 - 40 IU/L - - -  ALT 0 - 44 IU/L - - -   CBC Latest Ref Rng & Units 11/28/2019 10/11/2019 05/17/2019  WBC 4.0 - 10.5 K/uL 4.7 6.3 4.6  Hemoglobin 13.0 - 17.0 g/dL 13.7 15.1 14.9  Hematocrit 39 - 52 % 41.6 42.8 44.8  Platelets 150 - 400 K/uL 167 234 183   Lipid Panel     Component Value Date/Time   CHOL 107 07/04/2019 0843   TRIG 64 07/04/2019 0843   HDL 50 07/04/2019 0843   LDLCALC 43 07/04/2019 0843   HEMOGLOBIN A1C No results found for: HGBA1C, MPG TSH No results for input(s): TSH in the last 8760 hours.  External labs:   07/04/2019: PSA 4.900   06/18/2018: TSH 1.69.  Normal H and H, MCV 98, MCH 33, CBC otherwise normal.  Creatinine 1.82, EGFR 35/41, potassium 5.2, CMP otherwise normal.  09/28/2017: Creatinine 1.84, EGFR 35/41, potassium 5.6, CMP otherwise normal.  Normal H&H, MCV 99, CBC otherwise normal.  05/23/2015: Total cholesterol 208, triglycerides 113, HDL 53, LDL 132, creatinine 1.53, eGFR 44, potassium 4.6, CMP otherwise normal, CBC normal, PSA elevated at 5.3  Medications and allergies  No Known Allergies   Current Outpatient Medications  Medication Instructions  . acetaminophen (TYLENOL) 1,000 mg, Oral, Every 6 hours PRN  . apixaban (ELIQUIS) 5 mg, Oral, 2  times daily  . diltiazem (TIAZAC) 120 mg, Oral, Daily  . dofetilide (TIKOSYN) 250 mcg, Oral, 2 times daily  . lisinopril (ZESTRIL) 5 mg, Oral, Daily  . Magnesium Oxide 250 mg, Oral, Daily  . psyllium (METAMUCIL) 58.6 % packet 1 packet, Oral, Daily PRN  . rosuvastatin (CRESTOR) 5 mg, Oral, Daily   Radiology:   No results found.  Cardiac Studies:   Treadmill stress test [10/09/2014]: Indication: Atrial Fibrillation Resting EKG SR with 1st degree AV Block, LAD, LAFB, RBBB. Trifascicular block. Normal BP response. There was no ST-T changes of ischemia with exercise stress test. No stress induced arrhythmias. Stress terminated due to THR (>85% MPHR)/MPHR met. The patient exercised according to Bruce Protocol, Total time recorded 06:50 min achieving max heart rate of 158 which was 106% of MPHR for age and 8.34 METS of work.  Echocardiogram [02/26/2016]: 1. Left ventricle cavity is normal in size. Mild concentric hypertrophy of the left ventricle. Normal global wall motion. Calculated EF 68%. 2. Left atrial cavity is moderately dilated. 3. Mild to moderate mitral regurgitation. 4. Mild tricuspid regurgitation. No evidence of pulmonary hypertension. 5. Mild to moderate pulmonic regurgitation. 6. c.f. echo. of 07/19/2013, RV, RA sizes appear normal, TR is less severe, and, there is no pulmonary hypertension. MR appears slightly less severe.  Coronary CTA 03/06/2019: 1. Coronary artery calcium score 1837 Agatston units. This places the patient in the 86th percentile for age and gender, suggesting high risk for future cardiac events. 2. Extensive calcified plaque throughout the coronary system. Mild stenosis for the most part, possibly around 50% stenosis in the distal RCA and the proximal LCx (small vessel). However, difficult to quantify given extensive calcification with possibility of blooming artifact. Will send for FFR. 3.  Pulmonary veins without stenosis.  4.  No LA appendage thrombus  seen.  FFR 0.9 mid LAD, FFR 0.93 distal RCA, FFR 0.94 mid LCx: No evidence for hemodynamically significant stenosis.  Echocardiogram 09/29/2019:  Left ventricle cavity is normal in size. Mild concentric hypertrophy of the left ventricle. Normal global wall motion.  Normal LV systolic function  with EF 61%. Indeterminate diastolic  filling pattern.  Left atrial cavity is severely dilated.  Moderate (Grade II) mitral regurgitation.  Moderate tricuspid regurgitation. Estimated pulmonary artery systolic pressure is 34 mmHg.  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  EKG:  EKG 12/28/2019: Sinus bradycardia at rate of 58 bpm with first-degree AV block, normal axis, low-voltage complexes.  No evidence of ischemia.  Normal QT interval.    EKG 10/26/2019: Marked sinus bradycardia at rate of 52 bpm with first-degree AV block, leftward axis, incomplete right bundle branch block.  Poor R wave progression, low voltage complexes.  Pulmonary disease pattern.  Normal QT interval.   No significant change from 07/01/19.    Assessment     ICD-10-CM   1. Paroxysmal atrial fibrillation (HCC)  I48.0 EKG [id number]    Basic metabolic panel    Magnesium  2. Therapeutic drug monitoring  Y40.34 Basic metabolic panel    Magnesium  3. High risk medication use  Z79.899   4. Stage 3b chronic kidney disease  N18.32   5. Essential hypertension  I10    No orders of the defined types were placed in this encounter.   Medications Discontinued During This Encounter  Medication Reason  . dofetilide (TIKOSYN) 742 MCG capsule Duplicate    Recommendations:   Gregory [name]  is a 79 y.o. male Caucasian male with history of stage 3b CKD, hypertension, mild hyperlipidemia and moderate CAD by coronary CTA, paroxysmal atrial fibrillation. He has history of multiple cardioversions and eventually dofetilide was discontinued by Dr. [name] due to efficacy, and was on Multaq and has maintained sinus since Dec 2018.  Due to recurrence of atrial fibrillation, he underwent 2nd ablation on 02/22/2019, 1st ablation being on 11/28/2016.    Due to recurrent atrial fibrillation, I had admitted the patient on 11/28/2019 for initiation of Tikosyn, discharged home on 12/01/2019, he also underwent direct-current cardioversion on the day of discharge and converted to sinus rhythm with 120 J x 1 shocking.  He now presents for a 4-week office visit.  He is maintaining sinus rhythm.  He has not had any further palpitations or fatigue.  Continue present medications.  He will need close monitoring of his electrolytes and also magnesium levels.  If labs are normal, will continue present dose Tikosyn and I will see him back in 6 months for follow-up.  Otherwise he remained stable, blood pressures well controlled, no clinical evidence of heart failure.  Adrian Prows, MD, [facility] 12/28/2019, 3:52 PM [facility] [place] Pager: [phone] Office: [phone]

## 2019-12-30 ENCOUNTER — Ambulatory Visit: Payer: Medicare Other | Admitting: Cardiology

## 2019-12-30 LAB — BASIC METABOLIC PANEL
BUN/Creatinine Ratio: 22 (ref 10–24)
BUN: 31 mg/dL — ABNORMAL HIGH (ref 8–27)
CO2: 23 mmol/L (ref 20–29)
Calcium: 9.2 mg/dL (ref 8.6–10.2)
Chloride: 104 mmol/L (ref 96–106)
Creatinine, Ser: 1.42 mg/dL — ABNORMAL HIGH (ref 0.76–1.27)
GFR calc Af Amer: 54 mL/min/{1.73_m2} — ABNORMAL LOW (ref >59)
GFR calc non Af Amer: 47 mL/min/{1.73_m2} — ABNORMAL LOW (ref >59)
Glucose: 75 mg/dL (ref 65–99)
Potassium: 4.7 mmol/L (ref 3.5–5.2)
Sodium: 138 mmol/L (ref 134–144)

## 2019-12-30 LAB — MAGNESIUM: Magnesium: 2.4 mg/dL — ABNORMAL HIGH (ref 1.6–2.3)

## 2020-01-12 DIAGNOSIS — M10371 Gout due to renal impairment, right ankle and foot: Secondary | ICD-10-CM | POA: Insufficient documentation

## 2020-01-12 HISTORY — DX: Gout due to renal impairment, right ankle and foot: M10.371

## 2020-02-14 ENCOUNTER — Other Ambulatory Visit: Payer: Self-pay | Admitting: Cardiology

## 2020-02-14 DIAGNOSIS — N1832 Chronic kidney disease, stage 3b: Secondary | ICD-10-CM

## 2020-03-22 ENCOUNTER — Other Ambulatory Visit: Payer: Self-pay | Admitting: Cardiology

## 2020-03-22 ENCOUNTER — Other Ambulatory Visit: Payer: Self-pay

## 2020-03-22 DIAGNOSIS — E7800 Pure hypercholesterolemia, unspecified: Secondary | ICD-10-CM

## 2020-03-22 DIAGNOSIS — I2510 Atherosclerotic heart disease of native coronary artery without angina pectoris: Secondary | ICD-10-CM

## 2020-03-22 MED ORDER — ROSUVASTATIN CALCIUM 5 MG PO TABS: 5.0000 mg | ORAL_TABLET | Freq: Every day | ORAL | 3 refills | Status: DC

## 2020-04-16 NOTE — Telephone Encounter (Signed)
From pt

## 2020-04-21 ENCOUNTER — Other Ambulatory Visit: Payer: Self-pay

## 2020-04-21 DIAGNOSIS — I480 Paroxysmal atrial fibrillation: Secondary | ICD-10-CM

## 2020-04-23 MED ORDER — DOFETILIDE 250 MCG PO CAPS: 250.0000 ug | ORAL_CAPSULE | Freq: Two times a day (BID) | ORAL | 6 refills | Status: DC

## 2020-04-24 ENCOUNTER — Other Ambulatory Visit: Payer: Self-pay | Admitting: Cardiology

## 2020-04-24 ENCOUNTER — Telehealth: Payer: Self-pay

## 2020-04-24 DIAGNOSIS — I480 Paroxysmal atrial fibrillation: Secondary | ICD-10-CM

## 2020-04-24 MED ORDER — DOFETILIDE 250 MCG PO CAPS: 250.0000 ug | ORAL_CAPSULE | Freq: Two times a day (BID) | ORAL | 6 refills | Status: DC

## 2020-04-24 NOTE — Telephone Encounter (Signed)
Patient called upset, saying he called some days ago and his Dofetilide has not yet been refilled. Please advise.

## 2020-04-27 ENCOUNTER — Ambulatory Visit: Payer: Medicare Other | Admitting: Cardiology

## 2020-05-03 ENCOUNTER — Other Ambulatory Visit: Payer: Self-pay | Admitting: Cardiology

## 2020-07-02 ENCOUNTER — Ambulatory Visit: Payer: Medicare Other | Admitting: Cardiology

## 2020-07-06 ENCOUNTER — Encounter: Payer: Self-pay | Admitting: Cardiology

## 2020-07-06 ENCOUNTER — Ambulatory Visit: Payer: Medicare Other | Admitting: Cardiology

## 2020-07-06 ENCOUNTER — Other Ambulatory Visit: Payer: Self-pay

## 2020-07-06 VITALS — BP 124/79 | HR 59 | Temp 96.2°F | Resp 16 | Ht 70.0 in | Wt 193.6 lb

## 2020-07-06 DIAGNOSIS — I2510 Atherosclerotic heart disease of native coronary artery without angina pectoris: Secondary | ICD-10-CM

## 2020-07-06 DIAGNOSIS — N1832 Chronic kidney disease, stage 3b: Secondary | ICD-10-CM

## 2020-07-06 DIAGNOSIS — I480 Paroxysmal atrial fibrillation: Secondary | ICD-10-CM

## 2020-07-06 DIAGNOSIS — Z5181 Encounter for therapeutic drug level monitoring: Secondary | ICD-10-CM

## 2020-07-06 DIAGNOSIS — E7800 Pure hypercholesterolemia, unspecified: Secondary | ICD-10-CM

## 2020-07-06 NOTE — Progress Notes (Signed)
Primary Physician/Referring:  Christain Sacramento, MD  Patient ID: Gregory [name], male    DOB: [birth year], 80 y.o.   MRN: [id number]  Chief Complaint  Patient presents with  . Atrial Fibrillation  . Hypertension  . Follow-up    6 month   HPI:    Gregory [name]  is a 80 y.o. Caucasian male with history of stage 3b CKD, hypertension, mild hyperlipidemia and moderate CAD by coronary CTA, paroxysmal atrial fibrillation. He has history of multiple cardioversions and eventually dofetilide was discontinued by Dr. [name] due to efficacy, and was placed on Multaq and has maintained sinus since Dec 2018. Due to recurrence of atrial fibrillation, he underwent 2nd ablation on 02/22/2019, 1st ablation being on 11/28/2016.  He had repeat cardioversion on 05/24/2019 and after Tikosyn initiation had cardioversion on 12/01/2019.   He is tolerating Tikosyn without any side effects. It appears that he may have had 2 episodes of persistent atrial fibrillation but states that the heart rate was not fast and spontaneously resolved approximately 24 to 48 hours later. Otherwise he is presently doing well. No bleeding diathesis on Eliquis. No dizziness or syncope. Continues to remain active.  Past Medical History:  Diagnosis Date  . Arthritis    "right knee" (01/28/2016)  . Diverticulosis of colon (without mention of hemorrhage)   . Dysrhythmia   . Internal hemorrhoids without mention of complication   . Personal history of colonic polyps 01/17/2002   adenomatous, tublar adenoma 02/10/2007  . PNA (pneumonia) 03/31/2012   Past Surgical History:  Procedure Laterality Date  . ATRIAL FIBRILLATION ABLATION  11/28/2016  . ATRIAL FIBRILLATION ABLATION N/A 11/28/2016   Procedure: Atrial Fibrillation Ablation;  Surgeon: Constance Haw, MD;  Location: [place] [facility];  Service: Cardiovascular;  Laterality: N/A;  . ATRIAL FIBRILLATION ABLATION N/A 03/18/2019   Procedure: ATRIAL FIBRILLATION ABLATION;  Surgeon:  Constance Haw, MD;  Location: [place] [facility];  Service: Cardiovascular;  Laterality: N/A;  . CARDIOVERSION  05/03/2012   Procedure: CARDIOVERSION;  Surgeon: Laverda Page, MD;  Location: [facility] ENDOSCOPY;  Service: Cardiovascular;  Laterality: N/A;  . CARDIOVERSION  06/08/2012   Procedure: CARDIOVERSION;  Surgeon: Laverda Page, MD;  Location: [place];  Service: Cardiovascular;  Laterality: N/A;  Gregory [name] /janie  . CARDIOVERSION N/A 10/28/2013   Procedure: CARDIOVERSION;  Surgeon: Laverda Page, MD;  Location: [place];  Service: Cardiovascular;  Laterality: N/A;  . CARDIOVERSION N/A 06/30/2014   Procedure: CARDIOVERSION;  Surgeon: Laverda Page, MD;  Location: [place];  Service: Cardiovascular;  Laterality: N/A;  H&P in file  . CARDIOVERSION N/A 09/05/2014   Procedure: CARDIOVERSION;  Surgeon: Adrian Prows, MD;  Location: [place];  Service: Cardiovascular;  Laterality: N/A;  . CARDIOVERSION N/A 12/24/2015   Procedure: CARDIOVERSION;  Surgeon: Adrian Prows, MD;  Location: [place];  Service: Cardiovascular;  Laterality: N/A;  . CARDIOVERSION N/A 01/30/2016   Procedure: CARDIOVERSION;  Surgeon: Adrian Prows, MD;  Location: [facility] ENDOSCOPY;  Service: Cardiovascular;  Laterality: N/A;  . CARDIOVERSION N/A 02/08/2016   Procedure: CARDIOVERSION;  Surgeon: Adrian Prows, MD;  Location: [facility] ENDOSCOPY;  Service: Cardiovascular;  Laterality: N/A;  . CARDIOVERSION N/A 02/08/2019   Procedure: CARDIOVERSION;  Surgeon: Adrian Prows, MD;  Location: [facility] ENDOSCOPY;  Service: Cardiovascular;  Laterality: N/A;  . CARDIOVERSION N/A 05/24/2019   Procedure: CARDIOVERSION;  Surgeon: Elouise Munroe, MD;  Location: [facility] ENDOSCOPY;  Service: Cardiovascular;  Laterality: N/A;  . CARDIOVERSION N/A 09/21/2019   Procedure:  CARDIOVERSION;  Surgeon: Adrian Prows, MD;  Location: [facility] ENDOSCOPY;  Service: Cardiovascular;  Laterality: N/A;  . CARDIOVERSION N/A 12/01/2019   Procedure: CARDIOVERSION;  Surgeon: Adrian Prows, MD;  Location: [place];  Service: Cardiovascular;  Laterality: N/A;  . CATARACT EXTRACTION W/ INTRAOCULAR LENS  IMPLANT, BILATERAL Bilateral   . COLONOSCOPY    . INGUINAL HERNIA REPAIR Bilateral    as child  . KNEE ARTHROSCOPY Right 2004   Family History  Problem Relation Age of Onset  . Heart disease Father   . Stroke Father   . Cancer Mother        bladder  . Colon cancer Neg Hx   . Stomach cancer Neg Hx     Social History   Tobacco Use  . Smoking status: Never Smoker  . Smokeless tobacco: Never Used  Substance Use Topics  . Alcohol use: Yes    Alcohol/week: 1.0 standard drink    Types: 1 Glasses of wine per week    Comment: daily   Marital Status: Married  ROS  Review of Systems  Constitutional: Negative for weight gain.  Cardiovascular: Negative for dyspnea on exertion, leg swelling and syncope.  Respiratory: Negative for shortness of breath.   Musculoskeletal: Negative for joint swelling.   Objective  Blood pressure 124/79, pulse (!) 59, temperature (!) 96.2 F (35.7 C), temperature source Temporal, resp. rate 16, height 5' 10" (1.778 m), weight 193 lb 9.6 oz (87.8 kg), SpO2 98 %.  Vitals with BMI 07/06/2020 12/28/2019 12/01/2019  Height 5' 10" 5' 10" -  Weight 193 lbs 10 oz 185 lbs -  BMI 99.35 70.17 -  Systolic 793 903 009  Diastolic 79 74 71  Pulse 59 66 56     Physical Exam Constitutional:      General: He is not in acute distress.    Appearance: He is well-developed.  Cardiovascular:     Rate and Rhythm: Normal rate and regular rhythm.     Pulses: Intact distal pulses.     Heart sounds: No murmur heard. No gallop.      Comments: No leg edema. No JVD.   Pulmonary:     Effort: Pulmonary effort is normal. No accessory muscle usage or respiratory distress.     Breath sounds: Normal breath sounds.  Abdominal:     Palpations: Abdomen is soft.    Laboratory examination:   Recent Labs    11/30/19 0402 12/01/19 0317 12/29/19 1047  NA 141  139 138  K 4.2 4.3 4.7  CL 107 109 104  CO2 24 21* 23  GLUCOSE 104* 102* 75  BUN 28* 40* 31*  CREATININE 1.44* 1.54* 1.42*  CALCIUM 9.0 8.9 9.2  GFRNONAA 46* 43* 47*  GFRAA 54* 49* 54*   CrCl cannot be calculated (Patient's most recent lab result is older than the maximum 21 days allowed.).  CMP Latest Ref Rng & Units 12/29/2019 12/01/2019 11/30/2019  Glucose 65 - 99 mg/dL 75 102(H) 104(H)  BUN 8 - 27 mg/dL 31(H) 40(H) 28(H)  Creatinine 0.76 - 1.27 mg/dL 1.42(H) 1.54(H) 1.44(H)  Sodium 134 - 144 mmol/L 138 139 141  Potassium 3.5 - 5.2 mmol/L 4.7 4.3 4.2  Chloride 96 - 106 mmol/L 104 109 107  CO2 20 - 29 mmol/L 23 21(L) 24  Calcium 8.6 - 10.2 mg/dL 9.2 8.9 9.0  Total Protein 6.0 - 8.5 g/dL - - -  Total Bilirubin 0.0 - 1.2 mg/dL - - -  Alkaline Phos 39 - 117  IU/L - - -  AST 0 - 40 IU/L - - -  ALT 0 - 44 IU/L - - -   CBC Latest Ref Rng & Units 11/28/2019 10/11/2019 05/17/2019  WBC 4.0 - 10.5 K/uL 4.7 6.3 4.6  Hemoglobin 13.0 - 17.0 g/dL 13.7 15.1 14.9  Hematocrit 39.0 - 52.0 % 41.6 42.8 44.8  Platelets 150 - 400 K/uL 167 234 183   Lipid Panel     Component Value Date/Time   CHOL 107 07/04/2019 0843   TRIG 64 07/04/2019 0843   HDL 50 07/04/2019 0843   LDLCALC 43 07/04/2019 0843   HEMOGLOBIN A1C No results found for: HGBA1C, MPG TSH No results for input(s): TSH in the last 8760 hours.  External labs:   07/04/2019: PSA 4.900   06/18/2018: TSH 1.69.  Normal H and H, MCV 98, MCH 33, CBC otherwise normal.  Creatinine 1.82, EGFR 35/41, potassium 5.2, CMP otherwise normal.  09/28/2017: Creatinine 1.84, EGFR 35/41, potassium 5.6, CMP otherwise normal.  Normal H&H, MCV 99, CBC otherwise normal.  05/23/2015: Total cholesterol 208, triglycerides 113, HDL 53, LDL 132, creatinine 1.53, eGFR 44, potassium 4.6, CMP otherwise normal, CBC normal, PSA elevated at 5.3  Medications and allergies  No Known Allergies   Current Outpatient Medications on File Prior to Visit  Medication Sig  Dispense Refill  . acetaminophen (TYLENOL) 500 MG tablet Take 1,000 mg by mouth every 6 (six) hours as needed for moderate pain or headache.    . diltiazem (TIAZAC) 120 MG 24 hr capsule Take 1 capsule (120 mg total) by mouth daily. 90 capsule 3  . dofetilide (TIKOSYN) 250 MCG capsule Take 1 capsule (250 mcg total) by mouth 2 (two) times daily. 60 capsule 6  . ELIQUIS 5 MG TABS tablet TAKE 1 TABLET(5 MG) BY MOUTH TWICE DAILY 180 tablet 3  . lisinopril (ZESTRIL) 5 MG tablet TAKE 1 TABLET(5 MG) BY MOUTH DAILY 90 tablet 1  . Magnesium Oxide 250 MG TABS Take 1 tablet (250 mg total) by mouth daily. 90 tablet 3  . psyllium (METAMUCIL) 58.6 % packet Take 1 packet by mouth daily as needed (constipation).     . rosuvastatin (CRESTOR) 5 MG tablet Take 1 tablet (5 mg total) by mouth daily. 90 tablet 3   No current facility-administered medications on file prior to visit.    Radiology:   No results found.  Cardiac Studies:   Treadmill stress test [10/09/2014]: Indication: Atrial Fibrillation Resting EKG SR with 1st degree AV Block, LAD, LAFB, RBBB. Trifascicular block. Normal BP response. There was no ST-T changes of ischemia with exercise stress test. No stress induced arrhythmias. Stress terminated due to THR (>85% MPHR)/MPHR met. The patient exercised according to Bruce Protocol, Total time recorded 06:50 min achieving max heart rate of 158 which was 106% of MPHR for age and 8.34 METS of work.  Echocardiogram [02/26/2016]: 1. Left ventricle cavity is normal in size. Mild concentric hypertrophy of the left ventricle. Normal global wall motion. Calculated EF 68%. 2. Left atrial cavity is moderately dilated. 3. Mild to moderate mitral regurgitation. 4. Mild tricuspid regurgitation. No evidence of pulmonary hypertension. 5. Mild to moderate pulmonic regurgitation. 6. c.f. echo. of 07/19/2013, RV, RA sizes appear normal, TR is less severe, and, there is no pulmonary hypertension. MR appears slightly  less severe.  Coronary CTA 03/06/2019: 1. Coronary artery calcium score 1837 Agatston units. This places the patient in the 86th percentile for age and gender, suggesting high risk for future cardiac events.  2. Extensive calcified plaque throughout the coronary system. Mild stenosis for the most part, possibly around 50% stenosis in the distal RCA and the proximal LCx (small vessel). However, difficult to quantify given extensive calcification with possibility of blooming artifact. Will send for FFR. 3.  Pulmonary veins without stenosis.  4.  No LA appendage thrombus seen.  FFR 0.9 mid LAD, FFR 0.93 distal RCA, FFR 0.94 mid LCx: No evidence for hemodynamically significant stenosis.  Echocardiogram 09/29/2019:  Left ventricle cavity is normal in size. Mild concentric hypertrophy of the left ventricle. Normal global wall motion.  Normal LV systolic function  with EF 61%. Indeterminate diastolic filling pattern.  Left atrial cavity is severely dilated.  Moderate (Grade II) mitral regurgitation.  Moderate tricuspid regurgitation. Estimated pulmonary artery systolic pressure is 34 mmHg.  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  EKG:   EKG 1/21/202: Rhythm with first-degree AV block at rate of 63 bpm, normal axis, no evidence of ischemia. Low-voltage complexes. Single PAC.  No significant change from 12/28/2019. ABNORMAL   EKG 10/26/2019: Marked sinus bradycardia at rate of 52 bpm with first-degree AV block, leftward axis, incomplete right bundle branch block.  Poor R wave progression, low voltage complexes.  Pulmonary disease pattern.  Normal QT interval.   No significant change from 07/01/19.    Assessment     ICD-10-CM   1. Paroxysmal atrial fibrillation (HCC)  I48.0 EKG 12-Lead    CBC    CMP14+EGFR    Magnesium  2. Stage 3b chronic kidney disease (HCC)  N18.32   3. Coronary artery disease involving native coronary artery of native heart without angina pectoris  I25.10   4.  Therapeutic drug monitoring  Z51.81   5. Mild hypercholesterolemia  E78.00 Lipid Panel With LDL/HDL Ratio   No orders of the defined types were placed in this encounter.   There are no discontinued medications.  Orders Placed This Encounter  Procedures  . CBC  . Lipid Panel With LDL/HDL Ratio  . CMP14+EGFR  . Magnesium  . EKG 12-Lead    Recommendations:   AVYON HERENDEEN  is a 80 y.o. Caucasian male with history of stage 3b CKD, hypertension, mild hyperlipidemia and moderate CAD by coronary CTA, paroxysmal atrial fibrillation. He has history of multiple cardioversions and eventually dofetilide was discontinued by Dr. [name] due to efficacy, and was placed on Multaq and has maintained sinus since Dec 2018. Due to recurrence of atrial fibrillation, he underwent 2nd ablation on 02/22/2019, 1st ablation being on 11/28/2016.  He had repeat cardioversion on 05/24/2019 and after Tikosyn initiation had cardioversion on 12/01/2019.   He is tolerating Tikosyn without any side effects. It appears that he may have had 2 episodes of persistent atrial fibrillation but states that the heart rate was not fast and spontaneously resolved approximately 24 to 48 hours later.   He is presently doing well, blood pressure is well controlled, he has not had any anginal symptoms and no dyspnea or heart failure symptoms or signs on exam. EKG is essentially unchanged from previous and is maintaining sinus rhythm. I'll obtain routine labs for therapeutic drug monitoring. I will see him back in 6 months for follow-up.   Adrian Prows, MD, [facility] 07/06/2020, 9:30 AM Office: [phone] Pager: [phone]

## 2020-07-11 LAB — CMP14+EGFR
ALT: 16 IU/L (ref 0–44)
AST: 14 IU/L (ref 0–40)
Albumin/Globulin Ratio: 1.7 (ref 1.2–2.2)
Albumin: 4 g/dL (ref 3.7–4.7)
Alkaline Phosphatase: 55 IU/L (ref 44–121)
BUN/Creatinine Ratio: 14 (ref 10–24)
BUN: 20 mg/dL (ref 8–27)
Bilirubin Total: 0.5 mg/dL (ref 0.0–1.2)
CO2: 24 mmol/L (ref 20–29)
Calcium: 9.3 mg/dL (ref 8.6–10.2)
Chloride: 107 mmol/L — ABNORMAL HIGH (ref 96–106)
Creatinine, Ser: 1.46 mg/dL — ABNORMAL HIGH (ref 0.76–1.27)
GFR calc Af Amer: 53 mL/min/{1.73_m2} — ABNORMAL LOW (ref >59)
GFR calc non Af Amer: 45 mL/min/{1.73_m2} — ABNORMAL LOW (ref >59)
Globulin, Total: 2.3 g/dL (ref 1.5–4.5)
Glucose: 96 mg/dL (ref 65–99)
Potassium: 4.3 mmol/L (ref 3.5–5.2)
Sodium: 143 mmol/L (ref 134–144)
Total Protein: 6.3 g/dL (ref 6.0–8.5)

## 2020-07-11 LAB — CBC
Hematocrit: 44.5 % (ref 37.5–51.0)
Hemoglobin: 14.8 g/dL (ref 13.0–17.7)
MCH: 32.8 pg (ref 26.6–33.0)
MCHC: 33.3 g/dL (ref 31.5–35.7)
MCV: 99 fL — ABNORMAL HIGH (ref 79–97)
Platelets: 183 10*3/uL (ref 150–450)
RBC: 4.51 x10E6/uL (ref 4.14–5.80)
RDW: 12.8 % (ref 11.6–15.4)
WBC: 4.6 10*3/uL (ref 3.4–10.8)

## 2020-07-11 LAB — LIPID PANEL WITH LDL/HDL RATIO
Cholesterol, Total: 134 mg/dL (ref 100–199)
HDL: 50 mg/dL (ref >39)
LDL Chol Calc (NIH): 72 mg/dL (ref 0–99)
LDL/HDL Ratio: 1.4 ratio (ref 0.0–3.6)
Triglycerides: 56 mg/dL (ref 0–149)
VLDL Cholesterol Cal: 12 mg/dL (ref 5–40)

## 2020-07-11 LAB — MAGNESIUM: Magnesium: 2.3 mg/dL (ref 1.6–2.3)

## 2020-08-02 NOTE — Telephone Encounter (Signed)
From patient.

## 2020-08-06 ENCOUNTER — Other Ambulatory Visit: Payer: Self-pay | Admitting: Cardiology

## 2020-08-06 DIAGNOSIS — N1832 Chronic kidney disease, stage 3b: Secondary | ICD-10-CM

## 2020-08-06 NOTE — Telephone Encounter (Signed)
From pt

## 2020-08-07 NOTE — Telephone Encounter (Signed)
From pt

## 2020-09-19 NOTE — Telephone Encounter (Signed)
From patient.

## 2020-09-21 ENCOUNTER — Other Ambulatory Visit: Payer: Self-pay

## 2020-09-21 NOTE — Telephone Encounter (Signed)
From patient.

## 2020-10-04 ENCOUNTER — Other Ambulatory Visit: Payer: Self-pay | Admitting: Cardiology

## 2020-10-11 ENCOUNTER — Other Ambulatory Visit: Payer: Self-pay | Admitting: Podiatry

## 2020-10-11 ENCOUNTER — Ambulatory Visit (INDEPENDENT_AMBULATORY_CARE_PROVIDER_SITE_OTHER): Payer: Medicare Other | Admitting: Podiatry

## 2020-10-11 ENCOUNTER — Encounter: Payer: Self-pay | Admitting: Podiatry

## 2020-10-11 ENCOUNTER — Other Ambulatory Visit: Payer: Self-pay

## 2020-10-11 ENCOUNTER — Ambulatory Visit (INDEPENDENT_AMBULATORY_CARE_PROVIDER_SITE_OTHER): Payer: Medicare Other

## 2020-10-11 DIAGNOSIS — M7671 Peroneal tendinitis, right leg: Secondary | ICD-10-CM

## 2020-10-11 DIAGNOSIS — I2510 Atherosclerotic heart disease of native coronary artery without angina pectoris: Secondary | ICD-10-CM

## 2020-10-11 DIAGNOSIS — M778 Other enthesopathies, not elsewhere classified: Secondary | ICD-10-CM

## 2020-10-11 MED ORDER — TRIAMCINOLONE ACETONIDE 40 MG/ML IJ SUSP
20.0000 mg | Freq: Once | INTRAMUSCULAR | Status: AC
  Administered 2020-10-11: 20 mg

## 2020-10-11 MED ORDER — METHYLPREDNISOLONE 4 MG PO TBPK
ORAL_TABLET | ORAL | 0 refills | Status: DC
Start: 2020-10-11 — End: 2021-01-03

## 2020-10-13 NOTE — Progress Notes (Signed)
Subjective:  Patient ID: Gregory [name], male    DOB: September 15, 1940,  MRN: [id number] HPI Chief Complaint  Patient presents with  . 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 New Patient (Initial Visit)    80 y.o. male presents with the above complaint.   ROS: Denies fever chills nausea vomiting muscle aches pains calf pain back pain chest pain shortness of breath.  Past Medical History:  Diagnosis Date  . Arthritis    "right knee" (01/28/2016)  . Diverticulosis of colon (without mention of hemorrhage)   . Dysrhythmia   . Internal hemorrhoids without mention of complication   . Personal history of colonic polyps 01/17/2002   adenomatous, tublar adenoma 02/10/2007  . PNA (pneumonia) 03/31/2012   Past Surgical History:  Procedure Laterality Date  . ATRIAL FIBRILLATION ABLATION  11/28/2016  . ATRIAL FIBRILLATION ABLATION N/A 11/28/2016   Procedure: Atrial Fibrillation Ablation;  Surgeon: Regan Lemming, MD;  Location: [facility] INVASIVE CV LAB;  Service: Cardiovascular;  Laterality: N/A;  . ATRIAL FIBRILLATION ABLATION N/A 03/18/2019   Procedure: ATRIAL FIBRILLATION ABLATION;  Surgeon: Regan Lemming, MD;  Location: [facility] INVASIVE CV LAB;  Service: Cardiovascular;  Laterality: N/A;  . CARDIOVERSION  05/03/2012   Procedure: CARDIOVERSION;  Surgeon: [name], MD;  Location: [facility] ENDOSCOPY;  Service: Cardiovascular;  Laterality: N/A;  . CARDIOVERSION  06/08/2012   Procedure: CARDIOVERSION;  Surgeon: [name], MD;  Location: [facility] ENDOSCOPY;  Service: Cardiovascular;  Laterality: N/A;  Lawrence Marseilles /janie  . CARDIOVERSION N/A 10/28/2013   Procedure: CARDIOVERSION;  Surgeon: [name], MD;  Location: [facility] ENDOSCOPY;  Service: Cardiovascular;  Laterality: N/A;  . CARDIOVERSION N/A 06/30/2014   Procedure: CARDIOVERSION;  Surgeon: [name], MD;  Location: [facility] ENDOSCOPY;  Service:  Cardiovascular;  Laterality: N/A;  H&P in file  . CARDIOVERSION N/A 09/05/2014   Procedure: CARDIOVERSION;  Surgeon: [name], MD;  Location: [facility] ENDOSCOPY;  Service: Cardiovascular;  Laterality: N/A;  . CARDIOVERSION N/A 12/24/2015   Procedure: CARDIOVERSION;  Surgeon: [name], MD;  Location: [facility] ENDOSCOPY;  Service: Cardiovascular;  Laterality: N/A;  . CARDIOVERSION N/A 01/30/2016   Procedure: CARDIOVERSION;  Surgeon: [name], MD;  Location: [facility] ENDOSCOPY;  Service: Cardiovascular;  Laterality: N/A;  . CARDIOVERSION N/A 02/08/2016   Procedure: CARDIOVERSION;  Surgeon: [name], MD;  Location: [facility] ENDOSCOPY;  Service: Cardiovascular;  Laterality: N/A;  . CARDIOVERSION N/A 02/08/2019   Procedure: CARDIOVERSION;  Surgeon: [name], MD;  Location: [facility] ENDOSCOPY;  Service: Cardiovascular;  Laterality: N/A;  . CARDIOVERSION N/A 05/24/2019   Procedure: CARDIOVERSION;  Surgeon: Parke Poisson, MD;  Location: [facility] ENDOSCOPY;  Service: Cardiovascular;  Laterality: N/A;  . CARDIOVERSION N/A 09/21/2019   Procedure: CARDIOVERSION;  Surgeon: [name], MD;  Location: [facility] ENDOSCOPY;  Service: Cardiovascular;  Laterality: N/A;  . CARDIOVERSION N/A 12/01/2019   Procedure: CARDIOVERSION;  Surgeon: [name], MD;  Location: [facility] ENDOSCOPY;  Service: Cardiovascular;  Laterality: N/A;  . CATARACT EXTRACTION W/ INTRAOCULAR LENS  IMPLANT, BILATERAL Bilateral   . COLONOSCOPY    . INGUINAL HERNIA REPAIR Bilateral    as child  . KNEE ARTHROSCOPY Right 2004    Current Outpatient Medications:  .  methylPREDNISolone (MEDROL DOSEPAK) 4 MG TBPK tablet, 6 day dose pack - take as directed, Disp: 21 tablet, Rfl: 0 .  acetaminophen (TYLENOL) 500 MG tablet, Take 1,000 mg by  mouth every 6 (six) hours as needed for moderate pain or headache., Disp: , Rfl:  .  acetaZOLAMIDE (DIAMOX) 125 MG tablet, Take 125 mg by mouth 2 (two) times daily., Disp: , Rfl:  .  colchicine 0.6 MG tablet, Take by mouth., Disp: , Rfl:  .  diltiazem  (TIAZAC) 120 MG 24 hr capsule, TAKE 1 CAPSULE(120 MG) BY MOUTH DAILY, Disp: 90 capsule, Rfl: 0 .  dofetilide (TIKOSYN) 250 MCG capsule, Take 1 capsule (250 mcg total) by mouth 2 (two) times daily., Disp: 60 capsule, Rfl: 6 .  ELIQUIS 5 MG TABS tablet, TAKE 1 TABLET(5 MG) BY MOUTH TWICE DAILY, Disp: 180 tablet, Rfl: 3 .  lisinopril (ZESTRIL) 5 MG tablet, TAKE 1 TABLET(5 MG) BY MOUTH DAILY, Disp: 90 tablet, Rfl: 1 .  Magnesium Oxide 250 MG TABS, Take 1 tablet (250 mg total) by mouth daily., Disp: 90 tablet, Rfl: 3 .  psyllium (METAMUCIL) 58.6 % packet, Take 1 packet by mouth daily as needed (constipation). , Disp: , Rfl:  .  rosuvastatin (CRESTOR) 5 MG tablet, Take 1 tablet (5 mg total) by mouth daily., Disp: 90 tablet, Rfl: 3  No Known Allergies Review of Systems Objective:  There were no vitals filed for this visit.  General: Well developed, nourished, in no acute distress, alert and oriented x3   Dermatological: Skin is warm, dry and supple bilateral. Nails x 10 are well maintained; remaining integument appears unremarkable at this time. There are no open sores, no preulcerative lesions, no rash or signs of infection present.  Vascular: Dorsalis Pedis artery and Posterior Tibial artery pedal pulses are 2/4 bilateral with immedate capillary fill time. Pedal hair growth present. No varicosities and no lower extremity edema present bilateral.   Neruologic: Grossly intact via light touch bilateral. Vibratory intact via tuning fork bilateral. Protective threshold with Semmes Wienstein monofilament intact to all pedal sites bilateral. Patellar and Achilles deep tendon reflexes 2+ bilateral. No Babinski or clonus noted bilateral.   Musculoskeletal: No gross boney pedal deformities bilateral. No pain, crepitus, or limitation noted with foot and ankle range of motion bilateral. Muscular strength 5/5 in all groups tested bilateral.  Pain on palpation of the peroneus longus tendon at the arcuate portion  of the cuboid.  Also has pain on abduction and eversion of the foot.  Gait: Unassisted, Nonantalgic.    Radiographs:  Radiographs taken today demonstrate no significant osseous abnormalities to the foot.  Osseously mature individual no acute findings.  Mild soft tissue swelling over the lateral foot and ankle particular around the peroneals on the oblique view.  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  Plan: Discussed etiology pathology conservative surgical therapies at this point time I injected 10 mg Kenalog 5 mg Marcaine to the point of maximal tenderness start him on methylprednisolone placed him in a short cam boot and I will follow-up with him in about 4 weeks.  He understands this and then will to follow-up with me at that time      Lee-Ann Gal T. [place], [place]

## 2020-11-06 ENCOUNTER — Encounter: Payer: Self-pay | Admitting: Podiatry

## 2020-11-10 ENCOUNTER — Other Ambulatory Visit: Payer: Self-pay | Admitting: Cardiology

## 2020-11-10 DIAGNOSIS — I480 Paroxysmal atrial fibrillation: Secondary | ICD-10-CM

## 2020-11-13 ENCOUNTER — Other Ambulatory Visit: Payer: Self-pay

## 2020-11-13 ENCOUNTER — Encounter: Payer: Self-pay | Admitting: Podiatry

## 2020-11-13 ENCOUNTER — Ambulatory Visit (INDEPENDENT_AMBULATORY_CARE_PROVIDER_SITE_OTHER): Payer: Medicare Other | Admitting: Podiatry

## 2020-11-13 DIAGNOSIS — M7671 Peroneal tendinitis, right leg: Secondary | ICD-10-CM | POA: Diagnosis not present

## 2020-11-13 DIAGNOSIS — I480 Paroxysmal atrial fibrillation: Secondary | ICD-10-CM

## 2020-11-13 MED ORDER — DOFETILIDE 250 MCG PO CAPS: 250.0000 ug | ORAL_CAPSULE | Freq: Two times a day (BID) | ORAL | 6 refills | Status: DC

## 2020-11-13 MED ORDER — TRIAMCINOLONE ACETONIDE 40 MG/ML IJ SUSP
20.0000 mg | Freq: Once | INTRAMUSCULAR | Status: AC
  Administered 2020-11-13: 20 mg

## 2020-11-13 MED ORDER — METHYLPREDNISOLONE 4 MG PO TBPK: ORAL_TABLET | ORAL | 0 refills | Status: DC

## 2020-11-13 NOTE — Telephone Encounter (Signed)
Patient called requesting a refill on DOFETILIDE . Thank you.

## 2020-11-13 NOTE — Progress Notes (Signed)
He presents today for follow-up of his peroneal tendinitis states that he was nearly 100% improved after taking the medication however he started climbing a ladder multiple times on and off which cause the foot to start to hurt once again.  He goes on to say that he feels that his boot is too big.  Objective: Vital signs are stable he is alert oriented x3 he has pain on palpation to the peroneus longus tendon at the arcuate portion of the cuboid right.  Pain on palpation arcuate portion of the peroneus longus tendon right foot.  Assessment: Peroneus longus tendinitis.  Plan: Encouraged him to utilize a smaller cam walker which was provided for him today.  We are going to inject the area once again with 10 mg Kenalog 5 mg Marcaine.  Also started him back on methylprednisolone.  Should he have questions or concerns he will notify us immediately.  Follow-up with him in 1 month may need to consider orthotics.

## 2020-11-14 MED ORDER — DOFETILIDE 250 MCG PO CAPS: 250.0000 ug | ORAL_CAPSULE | Freq: Two times a day (BID) | ORAL | 3 refills | Status: DC

## 2020-11-14 NOTE — Addendum Note (Signed)
Addended by: Delrae Rend on: 11/14/2020 03:03 PM   Modules accepted: Orders

## 2020-11-14 NOTE — Telephone Encounter (Signed)
ICD-10-CM   1. Paroxysmal atrial fibrillation (HCC)  I48.0 dofetilide (TIKOSYN) 250 MCG capsule    DISCONTINUED: dofetilide (TIKOSYN) 250 MCG capsule    Meds ordered this encounter  Medications  . DISCONTD: dofetilide (TIKOSYN) 250 MCG capsule    Sig: Take 1 capsule (250 mcg total) by mouth 2 (two) times daily.    Dispense:  60 capsule    Refill:  6  . dofetilide (TIKOSYN) 250 MCG capsule    Sig: Take 1 capsule (250 mcg total) by mouth 2 (two) times daily.    Dispense:  180 capsule    Refill:  3     [name], MD, [facility] 11/14/2020, 3:03 PM Office: [phone] Fax: [phone] Pager: [phone]

## 2020-12-10 NOTE — Telephone Encounter (Signed)
from

## 2020-12-11 NOTE — Telephone Encounter (Signed)
From pt

## 2020-12-12 NOTE — Telephone Encounter (Signed)
From pt

## 2020-12-13 ENCOUNTER — Other Ambulatory Visit: Payer: Self-pay

## 2020-12-13 ENCOUNTER — Ambulatory Visit (INDEPENDENT_AMBULATORY_CARE_PROVIDER_SITE_OTHER): Payer: Medicare Other | Admitting: Podiatry

## 2020-12-13 ENCOUNTER — Encounter: Payer: Self-pay | Admitting: Podiatry

## 2020-12-13 DIAGNOSIS — S86311A Strain of muscle(s) and tendon(s) of peroneal muscle group at lower leg level, right leg, initial encounter: Secondary | ICD-10-CM

## 2020-12-13 DIAGNOSIS — I2510 Atherosclerotic heart disease of native coronary artery without angina pectoris: Secondary | ICD-10-CM | POA: Diagnosis not present

## 2020-12-13 DIAGNOSIS — M7671 Peroneal tendinitis, right leg: Secondary | ICD-10-CM

## 2020-12-13 NOTE — Progress Notes (Signed)
He presents today for follow-up of his peroneal tendinitis which was doing nearly 100% better before working on a ladder.  Then I saw him last visit started him on some medicine and pedal shot and there and he has not gotten any better and then to make matters worse he actually stepped in a hole and rolled his ankle while he was in his cam boot.  He states now it seems to be worse than it has been.  Objective: Vital signs are stable he is alert oriented x he has fluctuance on palpation of the peroneal tendons beneath the lateral malleolus extending to the peroneal tubercle and onto the fifth metatarsal and the arcuate portion of the cuboid.  Pulses remain palpable no swelling no ecchymosis.  Assessment: Probable tear of the peroneus longus tendon.  Plan: Discussed etiology pathology conservative versus surgical therapies at this point due to the worsening of his condition I feel that is necessary to go ahead and consider an MRI for surgical repair and differential diagnosis.

## 2020-12-18 ENCOUNTER — Encounter: Payer: Self-pay | Admitting: Podiatry

## 2020-12-19 ENCOUNTER — Ambulatory Visit
Admission: RE | Admit: 2020-12-19 | Discharge: 2020-12-19 | Disposition: A | Discharge status: Home / Self Care | Payer: Medicare Other | Source: Ambulatory Visit | Attending: Podiatry | Admitting: Podiatry

## 2020-12-19 ENCOUNTER — Encounter: Payer: Self-pay | Admitting: Podiatry

## 2020-12-19 DIAGNOSIS — S86311A Strain of muscle(s) and tendon(s) of peroneal muscle group at lower leg level, right leg, initial encounter: Secondary | ICD-10-CM

## 2020-12-20 ENCOUNTER — Telehealth: Payer: Self-pay | Admitting: *Deleted

## 2020-12-20 NOTE — Telephone Encounter (Signed)
Patient is calling to request MRI results, has seen them in MyChart and would like doctor to call to go over. Please advise.

## 2020-12-21 ENCOUNTER — Encounter: Payer: Self-pay | Admitting: Podiatry

## 2021-01-01 ENCOUNTER — Other Ambulatory Visit: Payer: Self-pay | Admitting: Cardiology

## 2021-01-02 ENCOUNTER — Other Ambulatory Visit: Payer: Self-pay | Admitting: Cardiology

## 2021-01-03 ENCOUNTER — Other Ambulatory Visit: Payer: Self-pay

## 2021-01-03 ENCOUNTER — Encounter: Payer: Self-pay | Admitting: Podiatry

## 2021-01-03 ENCOUNTER — Ambulatory Visit (INDEPENDENT_AMBULATORY_CARE_PROVIDER_SITE_OTHER): Payer: Medicare Other | Admitting: Podiatry

## 2021-01-03 ENCOUNTER — Encounter: Payer: Self-pay | Admitting: Cardiology

## 2021-01-03 ENCOUNTER — Ambulatory Visit: Payer: Medicare Other | Admitting: Cardiology

## 2021-01-03 VITALS — BP 133/70 | HR 83 | Temp 97.9°F | Resp 17 | Ht 70.0 in | Wt 182.2 lb

## 2021-01-03 DIAGNOSIS — I2510 Atherosclerotic heart disease of native coronary artery without angina pectoris: Secondary | ICD-10-CM | POA: Diagnosis not present

## 2021-01-03 DIAGNOSIS — S86311D Strain of muscle(s) and tendon(s) of peroneal muscle group at lower leg level, right leg, subsequent encounter: Secondary | ICD-10-CM

## 2021-01-03 DIAGNOSIS — N1832 Chronic kidney disease, stage 3b: Secondary | ICD-10-CM

## 2021-01-03 DIAGNOSIS — I480 Paroxysmal atrial fibrillation: Secondary | ICD-10-CM

## 2021-01-03 DIAGNOSIS — Z8679 Personal history of other diseases of the circulatory system: Secondary | ICD-10-CM

## 2021-01-03 DIAGNOSIS — I10 Essential (primary) hypertension: Secondary | ICD-10-CM

## 2021-01-03 MED ORDER — DILTIAZEM HCL ER BEADS 120 MG PO CP24: 120.0000 mg | ORAL_CAPSULE | Freq: Every day | ORAL | 3 refills | Status: DC

## 2021-01-03 NOTE — Progress Notes (Signed)
Primary Physician/Referring:  Christain Sacramento, MD  Patient ID: Gregory [name], male    DOB: 26-Jun-1940, 80 y.o.   MRN: [id number]  Chief Complaint  Patient presents with   Atrial Fibrillation   Hypertension    6 MONTHS   Coronary Artery Disease   HPI:    Gregory [name]  is a 80 y.o. Caucasian male with  history of stage 3b CKD, hypertension, mild hyperlipidemia and moderate CAD by coronary CTA, paroxysmal atrial fibrillation. He has history of multiple cardioversions and eventually dofetilide was discontinued by Dr. [name] due to efficacy, and was placed on  Multaq and has maintained sinus since Dec 2018. Due to recurrence of atrial fibrillation, he underwent 2nd ablation on 02/22/2019, 1st ablation being on 11/28/2016.  He had repeat cardioversion on 05/24/2019 and after Tikosyn initiation had cardioversion on 12/01/2019.   He is tolerating Tikosyn without any side effects.  He has had occasional episodes of atrial fibrillation, he had emailed [place] on MyChart and he was in persistent atrial fibrillation for 2 days.  But went back into regular rhythm.  Otherwise he is presently doing well. No bleeding diathesis on Eliquis. No dizziness or syncope. Continues to remain active.  Past Medical History:  Diagnosis Date   Arthritis    "right knee" (01/28/2016)   Diverticulosis of colon (without mention of hemorrhage)    Dysrhythmia    Internal hemorrhoids without mention of complication    Personal history of colonic polyps 01/17/2002   adenomatous, tublar adenoma 02/10/2007   PNA (pneumonia) 03/31/2012   Past Surgical History:  Procedure Laterality Date   ATRIAL FIBRILLATION ABLATION  11/28/2016   ATRIAL FIBRILLATION ABLATION N/A 11/28/2016   Procedure: Atrial Fibrillation Ablation;  Surgeon: Constance Haw, MD;  Location: [place] [facility];  Service: Cardiovascular;  Laterality: N/A;   ATRIAL FIBRILLATION ABLATION N/A 03/18/2019   Procedure: ATRIAL FIBRILLATION ABLATION;  Surgeon:  Constance Haw, MD;  Location: [place] [facility];  Service: Cardiovascular;  Laterality: N/A;   CARDIOVERSION  05/03/2012   Procedure: CARDIOVERSION;  Surgeon: Laverda Page, MD;  Location: [place];  Service: Cardiovascular;  Laterality: N/A;   CARDIOVERSION  06/08/2012   Procedure: CARDIOVERSION;  Surgeon: Laverda Page, MD;  Location: [place];  Service: Cardiovascular;  Laterality: N/A;  [name]   CARDIOVERSION N/A 10/28/2013   Procedure: CARDIOVERSION;  Surgeon: Laverda Page, MD;  Location: [place];  Service: Cardiovascular;  Laterality: N/A;   CARDIOVERSION N/A 06/30/2014   Procedure: CARDIOVERSION;  Surgeon: Laverda Page, MD;  Location: [place];  Service: Cardiovascular;  Laterality: N/A;  H&P in file   CARDIOVERSION N/A 09/05/2014   Procedure: CARDIOVERSION;  Surgeon: Adrian Prows, MD;  Location: [place];  Service: Cardiovascular;  Laterality: N/A;   CARDIOVERSION N/A 12/24/2015   Procedure: CARDIOVERSION;  Surgeon: Adrian Prows, MD;  Location: [place];  Service: Cardiovascular;  Laterality: N/A;   CARDIOVERSION N/A 01/30/2016   Procedure: CARDIOVERSION;  Surgeon: Adrian Prows, MD;  Location: [facility];  Service: Cardiovascular;  Laterality: N/A;   CARDIOVERSION N/A 02/08/2016   Procedure: CARDIOVERSION;  Surgeon: Adrian Prows, MD;  Location: [facility]COPY;  Service: Cardiovascular;  Laterality: N/A;   CARDIOVERSION N/A 02/08/2019   Procedure: CARDIOVERSION;  Surgeon: Adrian Prows, MD;  Location: [place];  Service: Cardiovascular;  Laterality: N/A;   CARDIOVERSION N/A 05/24/2019   Procedure: CARDIOVERSION;  Surgeon: Elouise Munroe, MD;  Location: [place];  Service: Cardiovascular;  Laterality: N/A;  CARDIOVERSION N/A 09/21/2019   Procedure: CARDIOVERSION;  Surgeon: Adrian Prows, MD;  Location: [place];  Service: Cardiovascular;  Laterality: N/A;   CARDIOVERSION N/A 12/01/2019   Procedure: CARDIOVERSION;  Surgeon: Adrian Prows, MD;   Location: [place];  Service: Cardiovascular;  Laterality: N/A;   CATARACT EXTRACTION W/ INTRAOCULAR LENS  IMPLANT, BILATERAL Bilateral    COLONOSCOPY     INGUINAL HERNIA REPAIR Bilateral    as child   KNEE ARTHROSCOPY Right 2004   Family History  Problem Relation Age of Onset   Heart disease Father    Stroke Father    Cancer Mother        bladder   Colon cancer Neg Hx    Stomach cancer Neg Hx     Social History   Tobacco Use   Smoking status: Never   Smokeless tobacco: Never  Substance Use Topics   Alcohol use: Yes    Alcohol/week: 1.0 standard drink    Types: 1 Glasses of wine per week    Comment: daily   Marital Status: Married  ROS  Review of Systems  Constitutional: Negative for weight gain.  Cardiovascular:  Negative for dyspnea on exertion, leg swelling and syncope.  Respiratory:  Negative for shortness of breath.   Musculoskeletal:  Positive for joint pain (Right foot pain since recent trip and accidental injury, cast). Negative for joint swelling.  Objective  Blood pressure 133/70, pulse 83, temperature 97.9 F (36.6 C), temperature source Temporal, resp. rate 17, height 5' 10"  (1.778 m), weight 182 lb 3.2 oz (82.6 kg), SpO2 97 %.  Vitals with BMI 01/03/2021 07/06/2020 12/28/2019  Height 5' 10"  5' 10"  5' 10"   Weight 182 lbs 3 oz 193 lbs 10 oz 185 lbs  BMI 26.14 62.69 48.54  Systolic 627 035 009  Diastolic 70 79 74  Pulse 83 59 66     Physical Exam Constitutional:      General: He is not in acute distress.    Appearance: He is well-developed.  Neck:     Vascular: No carotid bruit or JVD.  Cardiovascular:     Rate and Rhythm: Normal rate and regular rhythm.     Pulses: Intact distal pulses.     Heart sounds: No murmur heard.   No gallop.  Pulmonary:     Effort: Pulmonary effort is normal. No accessory muscle usage or respiratory distress.     Breath sounds: Normal breath sounds.  Abdominal:     Palpations: Abdomen is soft.  Musculoskeletal:         General: Signs of injury (Right foot in cast) present.     Right lower leg: No edema.     Left lower leg: No edema.   Laboratory examination:   Recent Labs    07/10/20 0817  NA 143  K 4.3  CL 107*  CO2 24  GLUCOSE 96  BUN 20  CREATININE 1.46*  CALCIUM 9.3  GFRNONAA 45*  GFRAA 53*   CrCl cannot be calculated (Patient's most recent lab result is older than the maximum 21 days allowed.).  CMP Latest Ref Rng & Units 07/10/2020 12/29/2019 12/01/2019  Glucose 65 - 99 mg/dL 96 75 102(H)  BUN 8 - 27 mg/dL 20 31(H) 40(H)  Creatinine 0.76 - 1.27 mg/dL 1.46(H) 1.42(H) 1.54(H)  Sodium 134 - 144 mmol/L 143 138 139  Potassium 3.5 - 5.2 mmol/L 4.3 4.7 4.3  Chloride 96 - 106 mmol/L 107(H) 104 109  CO2 20 - 29 mmol/L 24 23 21(L)  Calcium 8.6 - 10.2 mg/dL 9.3 9.2 8.9  Total Protein 6.0 - 8.5 g/dL 6.3 - -  Total Bilirubin 0.0 - 1.2 mg/dL 0.5 - -  Alkaline Phos 44 - 121 IU/L 55 - -  AST 0 - 40 IU/L 14 - -  ALT 0 - 44 IU/L 16 - -   CBC Latest Ref Rng & Units 07/10/2020 11/28/2019 10/11/2019  WBC 3.4 - 10.8 x10E3/uL 4.6 4.7 6.3  Hemoglobin 13.0 - 17.7 g/dL 14.8 13.7 15.1  Hematocrit 37.5 - 51.0 % 44.5 41.6 42.8  Platelets 150 - 450 x10E3/uL 183 167 234   Lipid Panel     Component Value Date/Time   CHOL 134 07/10/2020 0817   TRIG 56 07/10/2020 0817   HDL 50 07/10/2020 0817   LDLCALC 72 07/10/2020 0817   HEMOGLOBIN A1C No results found for: HGBA1C, MPG TSH No results for input(s): TSH in the last 8760 hours.  External labs:   07/04/2019: PSA 4.900   06/18/2018: TSH 1.69.  Normal H and H, MCV 98, MCH 33, CBC otherwise normal.  Creatinine 1.82, EGFR 35/41, potassium 5.2, CMP otherwise normal.  09/28/2017: Creatinine 1.84, EGFR 35/41, potassium 5.6, CMP otherwise normal.  Normal H&H, MCV 99, CBC otherwise normal.  05/23/2015: Total cholesterol 208, triglycerides 113, HDL 53, LDL 132, creatinine 1.53, eGFR 44, potassium 4.6, CMP otherwise normal, CBC normal, PSA elevated at 5.3  Medications  and allergies  No Known Allergies   Current Outpatient Medications on File Prior to Visit  Medication Sig Dispense Refill   acetaminophen (TYLENOL) 500 MG tablet Take 1,000 mg by mouth every 6 (six) hours as needed for moderate pain or headache.     dofetilide (TIKOSYN) 250 MCG capsule Take 1 capsule (250 mcg total) by mouth 2 (two) times daily. 180 capsule 3   ELIQUIS 5 MG TABS tablet TAKE 1 TABLET(5 MG) BY MOUTH TWICE DAILY 180 tablet 3   lisinopril (ZESTRIL) 5 MG tablet TAKE 1 TABLET(5 MG) BY MOUTH DAILY 90 tablet 1   Magnesium Oxide 250 MG TABS Take 1 tablet (250 mg total) by mouth daily. 90 tablet 3   psyllium (METAMUCIL) 58.6 % packet Take 1 packet by mouth daily as needed (constipation).      rosuvastatin (CRESTOR) 5 MG tablet Take 1 tablet (5 mg total) by mouth daily. 90 tablet 3   No current facility-administered medications on file prior to visit.    Radiology:   No results found.  Cardiac Studies:   Treadmill stress test  [10/09/2014]: Indication: Atrial Fibrillation Resting EKG SR with 1st degree AV Block, LAD, LAFB, RBBB. Trifascicular block. Normal BP response. There was no ST-T changes of ischemia with exercise stress test. No stress induced arrhythmias. Stress terminated due to THR (>85% MPHR)/MPHR met. The patient exercised according to Bruce Protocol, Total time recorded 06:50 min achieving max heart rate of 158 which was 106% of MPHR for age and 8.34 METS of work.  Coronary CTA 03/06/2019: 1. Coronary artery calcium score 1837 Agatston units. This places the patient in the 86th percentile for age and gender, suggesting high risk for future cardiac events. 2. Extensive calcified plaque throughout the coronary system. Mild stenosis for the most part, possibly around 50% stenosis in the distal RCA and the proximal LCx (small vessel). However, difficult to quantify given extensive calcification with possibility of blooming artifact. Will send for FFR. 3.  Pulmonary veins  without stenosis.  4.  No LA appendage thrombus seen.  FFR 0.9 mid LAD, FFR  0.93 distal RCA, FFR 0.94 mid LCx: No evidence for hemodynamically significant stenosis.  Echocardiogram 09/29/2019:  Left ventricle cavity is normal in size. Mild concentric hypertrophy of the left ventricle. Normal global wall motion.  Normal LV systolic function  with EF 61%. Indeterminate diastolic filling pattern.  Left atrial cavity is severely dilated.  Moderate (Grade II) mitral regurgitation.  Moderate tricuspid regurgitation. Estimated pulmonary artery systolic pressure is 34 mmHg.  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  EKG:  EKG 01/03/2021: Sinus rhythm with first-degree block at rate of 63 bpm, normal axis, no evidence of ischemia.  Normal QT interval.  No change from 07/06/2020.   Assessment     ICD-10-CM   1. Paroxysmal atrial fibrillation (HCC)  I48.0 EKG 12-Lead    CBC    Magnesium    diltiazem (TIAZAC) 120 MG 24 hr capsule    2. S/P ablation of atrial fibrillation  Z98.890    Z86.79     3. Stage 3b chronic kidney disease (HCC)  N18.32 CMP14+EGFR    4. Essential hypertension  I10      Meds ordered this encounter  Medications   diltiazem (TIAZAC) 120 MG 24 hr capsule    Sig: Take 1 capsule (120 mg total) by mouth daily.    Dispense:  90 capsule    Refill:  3     Medications Discontinued During This Encounter  Medication Reason   acetaZOLAMIDE (DIAMOX) 125 MG tablet Error   colchicine 0.6 MG tablet Error   methylPREDNISolone (MEDROL DOSEPAK) 4 MG TBPK tablet Error   methylPREDNISolone (MEDROL DOSEPAK) 4 MG TBPK tablet Error   diltiazem (TIAZAC) 120 MG 24 hr capsule Reorder    Orders Placed This Encounter  Procedures   CBC   CMP14+EGFR   Magnesium   EKG 12-Lead     Recommendations:   Gregory [name]  is a 80 y.o. Caucasian male with  history of stage 3b CKD, hypertension, mild hyperlipidemia and moderate CAD by coronary CTA, paroxysmal atrial  fibrillation. He has history of multiple cardioversions and eventually dofetilide was discontinued by Dr. [name] due to efficacy, and was placed on  Multaq and has maintained sinus since Dec 2018. Due to recurrence of atrial fibrillation, he underwent 2nd ablation on 02/22/2019, 1st ablation being on 11/28/2016.  He had repeat cardioversion on 05/24/2019 and after Tikosyn initiation had cardioversion on 12/01/2019.   He is tolerating Tikosyn without any side effects.  He has had occasional episodes of atrial fibrillation, longest lasting 2 days.  He is tolerating anticoagulation without bleeding diathesis.  He needs follow-up labs for CBC, magnesium and also BMP to follow-up on renal insufficiency as well.  He is presently doing well, blood pressure is well controlled, he has not had any anginal symptoms and no dyspnea or heart failure symptoms or signs on exam. EKG is essentially unchanged from previous and is maintaining sinus rhythm.  I will see him back in 6 months for follow-up.   Adrian Prows, MD, [facility] 01/03/2021, 11:11 AM Office: [phone] Pager: [phone]

## 2021-01-04 ENCOUNTER — Encounter: Payer: Self-pay | Admitting: Podiatry

## 2021-01-05 LAB — CBC
Hematocrit: 43 % (ref 37.5–51.0)
Hemoglobin: 14.4 g/dL (ref 13.0–17.7)
MCH: 33.3 pg — ABNORMAL HIGH (ref 26.6–33.0)
MCHC: 33.5 g/dL (ref 31.5–35.7)
MCV: 100 fL — ABNORMAL HIGH (ref 79–97)
Platelets: 186 x10E3/uL (ref 150–450)
RBC: 4.32 x10E6/uL (ref 4.14–5.80)
RDW: 13.2 % (ref 11.6–15.4)
WBC: 5 x10E3/uL (ref 3.4–10.8)

## 2021-01-05 LAB — CMP14+EGFR
ALT: 13 IU/L (ref 0–44)
AST: 15 IU/L (ref 0–40)
Albumin/Globulin Ratio: 1.9 (ref 1.2–2.2)
Albumin: 4.2 g/dL (ref 3.7–4.7)
Alkaline Phosphatase: 55 IU/L (ref 44–121)
BUN/Creatinine Ratio: 15 (ref 10–24)
BUN: 22 mg/dL (ref 8–27)
Bilirubin Total: 0.5 mg/dL (ref 0.0–1.2)
CO2: 27 mmol/L (ref 20–29)
Calcium: 9.4 mg/dL (ref 8.6–10.2)
Chloride: 104 mmol/L (ref 96–106)
Creatinine, Ser: 1.44 mg/dL — ABNORMAL HIGH (ref 0.76–1.27)
Globulin, Total: 2.2 g/dL (ref 1.5–4.5)
Glucose: 113 mg/dL — ABNORMAL HIGH (ref 65–99)
Potassium: 4.7 mmol/L (ref 3.5–5.2)
Sodium: 144 mmol/L (ref 134–144)
Total Protein: 6.4 g/dL (ref 6.0–8.5)
eGFR: 49 mL/min/{1.73_m2} — ABNORMAL LOW (ref >59)

## 2021-01-05 LAB — MAGNESIUM: Magnesium: 2.4 mg/dL — ABNORMAL HIGH (ref 1.6–2.3)

## 2021-01-05 NOTE — Progress Notes (Signed)
He presents today for follow-up of his MRI on his right foot.  States that the outside aspect of his right foot is still exquisitely painful.  States that there is really been no change.  Objective: Last time he was and we diagnosed him with most likely a tear of the peroneus longus tendon.  MRI does indicate a near complete tear of the peroneus longus tendon at the arcuate portion of the cuboid.  It also goes on to demonstrate some peroneal and flexor tenosynovitis moderate ankle osteoarthritic changes loss of fat pad within the tarsal canal.  Assessment: Tear of the peroneus longus tendon.  Plan: Discussed with the patient today in great detail regarding surgical intervention.  He understands this has to be fixed understands this can take anywhere from 6 to 8 weeks to heal the surgical site but understands that it may be as long as 6 months before it feels good enough for high-impact activities.  We discussed surgical timing in relation to activities and trips.  They understand this and are amendable to it.  Consented him today for primary repair of the peroneus longus tendon if too badly torn then a tenodesis to the brevis will be necessary.  They understand this and are amendable to it.  We consented him for a cast as well.  I will follow-up with him after he has been cleared by Dr. Jacinto Halim for cessation of his blood thinner at this point.  Surgery is currently scheduled for August 3 at [facility] for repair of the peroneus longus and cast application.

## 2021-01-08 ENCOUNTER — Ambulatory Visit: Payer: Medicare Other | Admitting: Podiatry

## 2021-01-09 NOTE — Telephone Encounter (Signed)
From pt

## 2021-01-16 ENCOUNTER — Other Ambulatory Visit: Payer: Self-pay | Admitting: Podiatry

## 2021-01-16 MED ORDER — OXYCODONE-ACETAMINOPHEN 10-325 MG PO TABS
1.0000 | ORAL_TABLET | Freq: Three times a day (TID) | ORAL | 0 refills | Status: AC | PRN
Start: 2021-01-16 — End: 2021-01-23

## 2021-01-16 MED ORDER — ONDANSETRON HCL 4 MG PO TABS: 4.0000 mg | ORAL_TABLET | Freq: Three times a day (TID) | ORAL | 0 refills | Status: DC | PRN

## 2021-01-16 MED ORDER — CEPHALEXIN 500 MG PO CAPS: 500.0000 mg | ORAL_CAPSULE | Freq: Three times a day (TID) | ORAL | 0 refills | Status: DC

## 2021-01-16 NOTE — Telephone Encounter (Signed)
From patient.

## 2021-01-18 ENCOUNTER — Encounter: Payer: Self-pay | Admitting: Podiatry

## 2021-01-18 DIAGNOSIS — S86311D Strain of muscle(s) and tendon(s) of peroneal muscle group at lower leg level, right leg, subsequent encounter: Secondary | ICD-10-CM | POA: Diagnosis not present

## 2021-01-21 NOTE — Telephone Encounter (Signed)
From pt

## 2021-01-22 ENCOUNTER — Encounter: Payer: Self-pay | Admitting: Podiatry

## 2021-01-24 ENCOUNTER — Other Ambulatory Visit: Payer: Self-pay

## 2021-01-24 ENCOUNTER — Encounter: Payer: Self-pay | Admitting: Podiatry

## 2021-01-24 ENCOUNTER — Ambulatory Visit (INDEPENDENT_AMBULATORY_CARE_PROVIDER_SITE_OTHER): Payer: Medicare Other | Admitting: Podiatry

## 2021-01-24 VITALS — BP 152/87 | HR 74 | Temp 98.2°F

## 2021-01-24 DIAGNOSIS — S86311D Strain of muscle(s) and tendon(s) of peroneal muscle group at lower leg level, right leg, subsequent encounter: Secondary | ICD-10-CM

## 2021-01-24 DIAGNOSIS — Z9889 Other specified postprocedural states: Secondary | ICD-10-CM

## 2021-01-25 NOTE — Progress Notes (Signed)
He presents today for follow-up of his surgery right lower extremity.  Date of surgery 8 5 states that it feels good for the most part and states that his toes have Good color.  He denies fever chills nausea vomiting muscle aches pains calf pain back pain chest pain shortness of breath.  States that he started back on his blood thinner.  Objective: Vital signs are stable he is alert and oriented x3 presents today with a knee scooter and his wife the plantar aspect of the cast is dry and clean.  Blood pressure is 152/87 pulse is 74 temperature 98.2 F.  Cast is intact it is loose around the top and he has good range of motion of his toes toes have good sensation.  There is no skin breakdown of the toes.  Assessment: Well-healing peroneal tendon repair.  Plan: Continue use of the knee scooter follow-up with [place] in 1 week for cast removal and reapplication.

## 2021-01-31 ENCOUNTER — Encounter: Payer: Medicare Other | Admitting: Podiatry

## 2021-02-01 ENCOUNTER — Ambulatory Visit (INDEPENDENT_AMBULATORY_CARE_PROVIDER_SITE_OTHER): Payer: Medicare Other | Admitting: Podiatry

## 2021-02-01 ENCOUNTER — Other Ambulatory Visit: Payer: Self-pay

## 2021-02-01 DIAGNOSIS — Z9889 Other specified postprocedural states: Secondary | ICD-10-CM | POA: Diagnosis not present

## 2021-02-01 DIAGNOSIS — S86311D Strain of muscle(s) and tendon(s) of peroneal muscle group at lower leg level, right leg, subsequent encounter: Secondary | ICD-10-CM | POA: Diagnosis not present

## 2021-02-04 ENCOUNTER — Other Ambulatory Visit: Payer: Self-pay | Admitting: Cardiology

## 2021-02-04 DIAGNOSIS — N1832 Chronic kidney disease, stage 3b: Secondary | ICD-10-CM

## 2021-02-06 ENCOUNTER — Encounter: Payer: Self-pay | Admitting: Podiatry

## 2021-02-06 NOTE — Progress Notes (Signed)
He presents today for follow-up of his surgery right lower extremity.  Date of surgery 8 5 states t patient states is doing well.  No acute complaints.  He is here for cast change.  He is known to Dr. Al Corpus.  No nausea fever chills vomiting.  Objective: Vital signs are stable he is alert and oriented x3.  The cast is intact.  No calf pain noted.  Incision is well coapted no signs of dehiscence or complication noted.  No skin breakdown or ulcer noted.  Assessment: Well-healing peroneal tendon repair.  Plan: Continue use of the knee scooter and the cast was removed and reapplied in standard technique without complication.

## 2021-02-11 NOTE — Telephone Encounter (Signed)
From pt

## 2021-02-14 ENCOUNTER — Ambulatory Visit (INDEPENDENT_AMBULATORY_CARE_PROVIDER_SITE_OTHER): Payer: Medicare Other | Admitting: Podiatry

## 2021-02-14 ENCOUNTER — Other Ambulatory Visit: Payer: Self-pay

## 2021-02-14 DIAGNOSIS — S86311D Strain of muscle(s) and tendon(s) of peroneal muscle group at lower leg level, right leg, subsequent encounter: Secondary | ICD-10-CM | POA: Diagnosis not present

## 2021-02-14 DIAGNOSIS — Z9889 Other specified postprocedural states: Secondary | ICD-10-CM | POA: Diagnosis not present

## 2021-02-16 NOTE — Progress Notes (Signed)
He presents today date of surgery was 01/18/2021 status post repair of peroneal tendon tear peroneus longus tendon with cast application.  He denies fever chills nausea vomiting muscle aches pains presents today with his wife utilizing a knee scooter.  Is currently 1 month out.  Cast is intact bilaterally the cast is dry and clean.  Objective: Cast is intact dry and clean once removed demonstrates staples are intact margins are well coapted staples were removed today no erythema cellulitis drainage or odor to some mild edema no ecchymosis good range of motion dorsiflexion plantarflexion inversion eversion passive and active.  Assessment: Well-healing surgical foot.  Plan: At this point we will place him in a high large cam walker.  Provided him with instructions for static stance but no ambulation did provide him with instructions on gentle ankle range of motion and physical therapy at home.  We will follow-up with him in about 2 weeks at which time we will start partial weightbearing or weightbearing with the cam walker.  He will still not go without the cam walker at that time.

## 2021-02-19 ENCOUNTER — Other Ambulatory Visit: Payer: Self-pay

## 2021-02-19 ENCOUNTER — Encounter: Payer: Self-pay | Admitting: Podiatry

## 2021-02-19 DIAGNOSIS — I484 Atypical atrial flutter: Secondary | ICD-10-CM

## 2021-02-19 NOTE — Telephone Encounter (Signed)
From patient.

## 2021-02-19 NOTE — Telephone Encounter (Signed)
From pt

## 2021-02-19 NOTE — Telephone Encounter (Signed)
Asiana made aware and she is taking care of this,

## 2021-02-20 ENCOUNTER — Telehealth: Payer: Self-pay | Admitting: *Deleted

## 2021-02-20 ENCOUNTER — Ambulatory Visit (INDEPENDENT_AMBULATORY_CARE_PROVIDER_SITE_OTHER): Payer: Medicare Other | Admitting: Podiatry

## 2021-02-20 ENCOUNTER — Encounter: Payer: Self-pay | Admitting: Podiatry

## 2021-02-20 DIAGNOSIS — Z9889 Other specified postprocedural states: Secondary | ICD-10-CM

## 2021-02-20 DIAGNOSIS — S86311D Strain of muscle(s) and tendon(s) of peroneal muscle group at lower leg level, right leg, subsequent encounter: Secondary | ICD-10-CM

## 2021-02-20 NOTE — Telephone Encounter (Signed)
Patient's wife has picked up boot this morning.

## 2021-02-20 NOTE — Telephone Encounter (Signed)
Per JG ok to cancel cardioversion, pt aware

## 2021-02-20 NOTE — Progress Notes (Signed)
Patient sent MyChart message requesting a short boot versus the tall boot that he already has. Dr. Al Corpus recommended patient keeping the boot until he transitions into a sneaker, but patient states he prefers the short boot over the tall.   Charges placed for new boot and his wife will pick it up.

## 2021-02-24 ENCOUNTER — Encounter: Payer: Self-pay | Admitting: Podiatry

## 2021-02-27 ENCOUNTER — Ambulatory Visit (HOSPITAL_COMMUNITY): Admission: RE | Admit: 2021-02-27 | Payer: Medicare Other | Source: Home / Self Care | Admitting: Cardiology

## 2021-02-27 ENCOUNTER — Encounter (HOSPITAL_COMMUNITY): Admission: RE | Payer: Self-pay | Source: Home / Self Care

## 2021-02-27 SURGERY — CARDIOVERSION
Anesthesia: General

## 2021-02-28 ENCOUNTER — Ambulatory Visit (INDEPENDENT_AMBULATORY_CARE_PROVIDER_SITE_OTHER): Payer: Medicare Other | Admitting: Podiatry

## 2021-02-28 ENCOUNTER — Other Ambulatory Visit: Payer: Self-pay

## 2021-02-28 ENCOUNTER — Encounter: Payer: Medicare Other | Admitting: Sports Medicine

## 2021-02-28 ENCOUNTER — Encounter: Payer: Medicare Other | Admitting: Podiatry

## 2021-02-28 DIAGNOSIS — S86311D Strain of muscle(s) and tendon(s) of peroneal muscle group at lower leg level, right leg, subsequent encounter: Secondary | ICD-10-CM

## 2021-02-28 DIAGNOSIS — Z9889 Other specified postprocedural states: Secondary | ICD-10-CM

## 2021-03-04 NOTE — Progress Notes (Signed)
Subjective: Gregory [name] is a 80 y.o. is seen today in office s/p repair peroneal tendon preformed on January 18, 2021 with Dr. Al Corpus.  He states he has been doing well.  There is still a small scab present incision but is closed.  No drainage or pus.  Still gets occasional discomfort.  He does admit that he has been putting weight on the foot while in the boot at times at home to see how it feels.  He has been feeling well otherwise.  No fevers or chills.  No other concerns.  Objective: General: No acute distress, AAOx3  DP/PT pulses palpable 2/4, CRT < 3 sec to all digits.  Protective sensation intact. Motor function intact.  Right foot: Incision is well coapted without any evidence of dehiscence.  There is 1 small area of a scab but there is no drainage or pus.  No evidence of dehiscence.  There is no surrounding erythema, ascending cellulitis.  Minimal edema.  No significant tenderness palpation on surgical site. No pain with calf compression, swelling, warmth, erythema.   Assessment and Plan:  Status post right peroneal tendon repair with Dr. Al Corpus, doing well with no complications   -Treatment options discussed including all alternatives, risks, and complications -Per Dr. Geryl Rankins last note he can start partial weightbearing in the cam boot as he progressed with this he can weight-bear as tolerated in cam boot.  Discussed ice elevation -Monitor for any clinical signs or symptoms of infection and DVT/PE and directed to call the office immediately should any occur or go to the ER. -Follow-up as scheduled with Dr. Al Corpus or sooner if any problems arise. In the meantime, encouraged to call the office with any questions, concerns, change in symptoms.   Ovid Curd, DPM

## 2021-03-08 ENCOUNTER — Other Ambulatory Visit: Payer: Self-pay | Admitting: Cardiology

## 2021-03-08 DIAGNOSIS — E7800 Pure hypercholesterolemia, unspecified: Secondary | ICD-10-CM

## 2021-03-08 DIAGNOSIS — I2510 Atherosclerotic heart disease of native coronary artery without angina pectoris: Secondary | ICD-10-CM

## 2021-03-11 ENCOUNTER — Encounter: Payer: Self-pay | Admitting: Podiatry

## 2021-03-13 ENCOUNTER — Encounter: Payer: Self-pay | Admitting: Cardiology

## 2021-03-13 ENCOUNTER — Other Ambulatory Visit: Payer: Self-pay

## 2021-03-13 ENCOUNTER — Ambulatory Visit: Payer: Medicare Other | Admitting: Cardiology

## 2021-03-13 VITALS — BP 137/77 | HR 57 | Temp 97.7°F | Resp 17 | Ht 70.0 in | Wt 187.6 lb

## 2021-03-13 DIAGNOSIS — I10 Essential (primary) hypertension: Secondary | ICD-10-CM

## 2021-03-13 DIAGNOSIS — I480 Paroxysmal atrial fibrillation: Secondary | ICD-10-CM

## 2021-03-13 NOTE — Progress Notes (Signed)
Primary Physician/Referring:  Christain Sacramento, MD  Patient ID: CHANNIN AGUSTIN, male    DOB: [birth year], 80 y.o.   MRN: [id number]  Chief Complaint  Patient presents with   post cardioversion   Paroxysmal atrial fibrillation [facility])   HPI:    NIKOLAUS PIENTA  is a 80 y.o. Caucasian male with  history of stage 3b CKD, hypertension, mild hyperlipidemia and moderate CAD by coronary CTA, paroxysmal atrial fibrillation. He has history of multiple cardioversions and eventually dofetilide was discontinued by Dr. [name] due to efficacy, and was placed on  Multaq and has maintained sinus since Dec 2018. Due to recurrence of atrial fibrillation, he underwent 2nd ablation on 02/22/2019, 1st ablation being on 11/28/2016.  He had repeat cardioversion on 05/24/2019 and after Tikosyn initiation had cardioversion on 12/01/2019.   He is tolerating Tikosyn without any side effects.  He has had occasional episodes of atrial fibrillation. Otherwise he is presently doing well. No bleeding diathesis on Eliquis. No dizziness or syncope. Continues to remain active.  Past Medical History:  Diagnosis Date   Arthritis    "right knee" (01/28/2016)   Diverticulosis of colon (without mention of hemorrhage)    Dysrhythmia    Internal hemorrhoids without mention of complication    Personal history of colonic polyps 01/17/2002   adenomatous, tublar adenoma 02/10/2007   PNA (pneumonia) 03/31/2012   Past Surgical History:  Procedure Laterality Date   ATRIAL FIBRILLATION ABLATION  11/28/2016   ATRIAL FIBRILLATION ABLATION N/A 11/28/2016   Procedure: Atrial Fibrillation Ablation;  Surgeon: Constance Haw, MD;  Location: [place] CV LAB;  Service: Cardiovascular;  Laterality: N/A;   ATRIAL FIBRILLATION ABLATION N/A 03/18/2019   Procedure: ATRIAL FIBRILLATION ABLATION;  Surgeon: Constance Haw, MD;  Location: [place] CV LAB;  Service: Cardiovascular;  Laterality: N/A;   CARDIOVERSION  05/03/2012    Procedure: CARDIOVERSION;  Surgeon: Laverda Page, MD;  Location: [place];  Service: Cardiovascular;  Laterality: N/A;   CARDIOVERSION  06/08/2012   Procedure: CARDIOVERSION;  Surgeon: Laverda Page, MD;  Location: [place];  Service: Cardiovascular;  Laterality: N/A;  [name]   CARDIOVERSION N/A 10/28/2013   Procedure: CARDIOVERSION;  Surgeon: Laverda Page, MD;  Location: [place];  Service: Cardiovascular;  Laterality: N/A;   CARDIOVERSION N/A 06/30/2014   Procedure: CARDIOVERSION;  Surgeon: Laverda Page, MD;  Location: [place];  Service: Cardiovascular;  Laterality: N/A;  H&P in file   CARDIOVERSION N/A 09/05/2014   Procedure: CARDIOVERSION;  Surgeon: Adrian Prows, MD;  Location: [place];  Service: Cardiovascular;  Laterality: N/A;   CARDIOVERSION N/A 12/24/2015   Procedure: CARDIOVERSION;  Surgeon: Adrian Prows, MD;  Location: [place];  Service: Cardiovascular;  Laterality: N/A;   CARDIOVERSION N/A 01/30/2016   Procedure: CARDIOVERSION;  Surgeon: Adrian Prows, MD;  Location: [place];  Service: Cardiovascular;  Laterality: N/A;   CARDIOVERSION N/A 02/08/2016   Procedure: CARDIOVERSION;  Surgeon: Adrian Prows, MD;  Location: [facility] ENDOSCOPY;  Service: Cardiovascular;  Laterality: N/A;   CARDIOVERSION N/A 02/08/2019   Procedure: CARDIOVERSION;  Surgeon: Adrian Prows, MD;  Location: [place];  Service: Cardiovascular;  Laterality: N/A;   CARDIOVERSION N/A 05/24/2019   Procedure: CARDIOVERSION;  Surgeon: Elouise Munroe, MD;  Location: [facility] ENDOSCOPY;  Service: Cardiovascular;  Laterality: N/A;   CARDIOVERSION N/A 09/21/2019   Procedure: CARDIOVERSION;  Surgeon: Adrian Prows, MD;  Location: [place];  Service: Cardiovascular;  Laterality: N/A;   CARDIOVERSION N/A 12/01/2019   Procedure: CARDIOVERSION;  Surgeon: Adrian Prows, MD;  Location: [facility] ENDOSCOPY;  Service: Cardiovascular;  Laterality: N/A;   CATARACT EXTRACTION W/ INTRAOCULAR LENS  IMPLANT, BILATERAL  Bilateral    COLONOSCOPY     INGUINAL HERNIA REPAIR Bilateral    as child   KNEE ARTHROSCOPY Right 2004   Family History  Problem Relation Age of Onset   Heart disease Father    Stroke Father    Cancer Mother        bladder   Colon cancer Neg Hx    Stomach cancer Neg Hx     Social History   Tobacco Use   Smoking status: Never   Smokeless tobacco: Never  Substance Use Topics   Alcohol use: Yes    Alcohol/week: 1.0 standard drink    Types: 1 Glasses of wine per week    Comment: daily   Marital Status: Married  ROS  Review of Systems  Constitutional: Negative for weight gain.  Cardiovascular:  Negative for dyspnea on exertion, leg swelling and syncope.  Respiratory:  Negative for shortness of breath.   Musculoskeletal:  Positive for joint pain (Right foot pain since recent trip and accidental injury, cast). Negative for joint swelling.  Objective  Blood pressure 137/77, pulse (!) 57, temperature 97.7 F (36.5 C), temperature source Temporal, resp. rate 17, height _0  (1.778 m), weight 187 lb 9.6 oz (85.1 kg), SpO2 95 %.  Vitals with BMI 03/13/2021 01/24/2021 01/03/2021  Height _1  - _2   Weight 187 lbs 10 oz - 182 lbs 3 oz  BMI 48.27 - 07.86  Systolic 754 492 010  Diastolic 77 87 70  Pulse 57 74 83     Physical Exam Constitutional:      General: He is not in acute distress.    Appearance: He is well-developed.  Neck:     Vascular: No carotid bruit or JVD.  Cardiovascular:     Rate and Rhythm: Regular rhythm. Bradycardia present.     Pulses: Intact distal pulses.     Heart sounds: No murmur heard.   No gallop.  Pulmonary:     Effort: Pulmonary effort is normal. No accessory muscle usage or respiratory distress.     Breath sounds: Normal breath sounds.  Abdominal:     Palpations: Abdomen is soft.  Musculoskeletal:        General: Signs of injury (Right foot in cast) present.     Right lower leg: No edema.     Left lower leg: No edema.   Laboratory  examination:   Recent Labs    07/10/20 0817 01/04/21 1010  NA 143 144  K 4.3 4.7  CL 107* 104  CO2 24 27  GLUCOSE 96 113*  BUN 20 22  CREATININE 1.46* 1.44*  CALCIUM 9.3 9.4  GFRNONAA 45*  --   GFRAA 53*  --    CrCl cannot be calculated (Patient's most recent lab result is older than the maximum 21 days allowed.).  CMP Latest Ref Rng & Units 01/04/2021 07/10/2020 12/29/2019  Glucose 65 - 99 mg/dL 113(H) 96 75  BUN 8 - 27 mg/dL 22 20 31(H)  Creatinine 0.76 - 1.27 mg/dL 1.44(H) 1.46(H) 1.42(H)  Sodium 134 - 144 mmol/L 144 143 138  Potassium 3.5 - 5.2 mmol/L 4.7 4.3 4.7  Chloride 96 - 106 mmol/L 104 107(H) 104  CO2 20 - 29 mmol/L _3 Calcium 8.6 - 10.2 mg/dL 9.4 9.3 9.2  Total Protein 6.0 - 8.5 g/dL 6.4 6.3 -  Total Bilirubin 0.0 - 1.2 mg/dL 0.5 0.5 -  Alkaline Phos 44 - 121 IU/L 55 55 -  AST 0 - 40 IU/L 15 14 -  ALT 0 - 44 IU/L 13 16 -   CBC Latest Ref Rng & Units 01/04/2021 07/10/2020 11/28/2019  WBC 3.4 - 10.8 x10E3/uL 5.0 4.6 4.7  Hemoglobin 13.0 - 17.7 g/dL 14.4 14.8 13.7  Hematocrit 37.5 - 51.0 % 43.0 44.5 41.6  Platelets 150 - 450 x10E3/uL 186 183 167   Lipid Panel     Component Value Date/Time   CHOL 134 07/10/2020 0817   TRIG 56 07/10/2020 0817   HDL 50 07/10/2020 0817   LDLCALC 72 07/10/2020 0817   HEMOGLOBIN A1C No results found for: HGBA1C, MPG TSH No results for input(s): TSH in the last 8760 hours.  External labs:   07/04/2019: PSA 4.900   06/18/2018: TSH 1.69.  Normal H and H, MCV 98, MCH 33, CBC otherwise normal.  Creatinine 1.82, EGFR 35/41, potassium 5.2, CMP otherwise normal.  09/28/2017: Creatinine 1.84, EGFR 35/41, potassium 5.6, CMP otherwise normal.  Normal H&H, MCV 99, CBC otherwise normal.  05/23/2015: Total cholesterol 208, triglycerides 113, HDL 53, LDL 132, creatinine 1.53, eGFR 44, potassium 4.6, CMP otherwise normal, CBC normal, PSA elevated at 5.3  Medications and allergies  No Known Allergies   Current Outpatient Medications on  File Prior to Visit  Medication Sig Dispense Refill   acetaminophen (TYLENOL) 500 MG tablet Take 1,000 mg by mouth every 6 (six) hours as needed for moderate pain or headache.     diltiazem (TIAZAC) 120 MG 24 hr capsule Take 1 capsule (120 mg total) by mouth daily. 90 capsule 3   dofetilide (TIKOSYN) 250 MCG capsule Take 1 capsule (250 mcg total) by mouth 2 (two) times daily. 180 capsule 3   ELIQUIS 5 MG TABS tablet TAKE 1 TABLET(5 MG) BY MOUTH TWICE DAILY 180 tablet 3   lisinopril (ZESTRIL) 5 MG tablet TAKE 1 TABLET(5 MG) BY MOUTH DAILY 90 tablet 1   Magnesium Oxide 250 MG TABS Take 1 tablet (250 mg total) by mouth daily. 90 tablet 3   oxyCODONE-acetaminophen (PERCOCET) 10-325 MG tablet Take 1 tablet by mouth as needed.     psyllium (METAMUCIL) 58.6 % packet Take 1 packet by mouth daily as needed (constipation).      rosuvastatin (CRESTOR) 5 MG tablet TAKE 1 TABLET(5 MG) BY MOUTH DAILY 90 tablet 3   No current facility-administered medications on file prior to visit.    Radiology:   No results found.  Cardiac Studies:   Treadmill stress test  [10/09/2014]: Indication: Atrial Fibrillation Resting EKG SR with 1st degree AV Block, LAD, LAFB, RBBB. Trifascicular block. Normal BP response. There was no ST-T changes of ischemia with exercise stress test. No stress induced arrhythmias. Stress terminated due to THR (>85% MPHR)/MPHR met. The patient exercised according to Bruce Protocol, Total time recorded 06:50 min achieving max heart rate of 158 which was 106% of MPHR for age and 8.34 METS of work.  Coronary CTA 03/06/2019: 1. Coronary artery calcium score 1837 Agatston units. This places the patient in the 86th percentile for age and gender, suggesting high risk for future cardiac events. 2. Extensive calcified plaque throughout the coronary system. Mild stenosis for the most part, possibly around 50% stenosis in the distal RCA and the proximal LCx (small vessel). However, difficult to quantify  given extensive calcification with possibility of blooming artifact. Will send for FFR. 3.  Pulmonary veins  without stenosis.  4.  No LA appendage thrombus seen.  FFR 0.9 mid LAD, FFR 0.93 distal RCA, FFR 0.94 mid LCx: No evidence for hemodynamically significant stenosis.  Echocardiogram 09/29/2019:  Left ventricle cavity is normal in size. Mild concentric hypertrophy of the left ventricle. Normal global wall motion.  Normal LV systolic function  with EF 61%. Indeterminate diastolic filling pattern.  Left atrial cavity is severely dilated.  Moderate (Grade II) mitral regurgitation.  Moderate tricuspid regurgitation. Estimated pulmonary artery systolic pressure is 34 mmHg.  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  EKG:  EKG 03/13/2021: Sinus rhythm with first-degree block at rate of 58 bpm, normal axis, low-voltage complexes, no evidence of ischemia normal QT interval. No significant change from EKG 01/03/2021.   Assessment     ICD-10-CM   1. Paroxysmal atrial fibrillation (HCC)  I48.0 EKG 12-Lead    2. Essential hypertension  I10      No orders of the defined types were placed in this encounter.    Medications Discontinued During This Encounter  Medication Reason   cephALEXin (KEFLEX) 500 MG capsule Error   ondansetron (ZOFRAN) 4 MG tablet Error    Orders Placed This Encounter  Procedures   EKG 12-Lead     Recommendations:   SATYA BUTTRAM  is a 80 y.o. Caucasian male with  history of stage 3b CKD, hypertension, mild hyperlipidemia and moderate CAD by coronary CTA, paroxysmal atrial fibrillation. He has history of multiple cardioversions and eventually dofetilide was discontinued by Dr. [name] due to efficacy, and was placed on  Multaq and has maintained sinus since Dec 2018. Due to recurrence of atrial fibrillation, he underwent 2nd ablation on 02/22/2019, 1st ablation being on 11/28/2016.  He had repeat cardioversion on 05/24/2019 and after Tikosyn initiation  had cardioversion on 12/01/2019.   He is tolerating Tikosyn without any side effects.  He has had occasional episodes of atrial fibrillation, longest lasting 2 days.  He is tolerating anticoagulation without bleeding diathesis.  Labs are stable.  No changes in the medications were done today.  I will see him back in 6 months.  He is presently doing well, blood pressure is well controlled. EKG is essentially unchanged from previous and is maintaining sinus rhythm.  I will see him back in 6 months for follow-up.   Adrian Prows, MD, [facility] 03/13/2021, 11:33 AM Office: [phone] Pager: [phone]

## 2021-03-19 ENCOUNTER — Ambulatory Visit (INDEPENDENT_AMBULATORY_CARE_PROVIDER_SITE_OTHER): Payer: Medicare Other | Admitting: Podiatry

## 2021-03-19 ENCOUNTER — Encounter: Payer: Self-pay | Admitting: Podiatry

## 2021-03-19 ENCOUNTER — Other Ambulatory Visit: Payer: Self-pay

## 2021-03-19 DIAGNOSIS — S86311D Strain of muscle(s) and tendon(s) of peroneal muscle group at lower leg level, right leg, subsequent encounter: Secondary | ICD-10-CM

## 2021-03-19 DIAGNOSIS — Z9889 Other specified postprocedural states: Secondary | ICD-10-CM

## 2021-03-20 ENCOUNTER — Encounter: Payer: Self-pay | Admitting: Podiatry

## 2021-03-20 DIAGNOSIS — Z9889 Other specified postprocedural states: Secondary | ICD-10-CM

## 2021-03-20 DIAGNOSIS — S86311D Strain of muscle(s) and tendon(s) of peroneal muscle group at lower leg level, right leg, subsequent encounter: Secondary | ICD-10-CM

## 2021-03-20 NOTE — Progress Notes (Signed)
He presents today with his wife date of surgery 01/18/2021 repair peroneal longus tendon.  He states that he still quite a bit of swelling but it feels all right.  Continues to ambulate in his boot.  Objective: Vital signs are stable alert oriented x3 still has some mild erythema and tenderness on palpation of the lateral foot and ankle.  But he has very good eversion against resistance.  Assessment well-healing surgical foot leg right.  Plan: I will place him in a Tri-Lock brace that he can get back into his tennis shoe.  We are also going to send him to physical therapy for postoperative strength training balance training and gait training.

## 2021-03-25 ENCOUNTER — Encounter (HOSPITAL_BASED_OUTPATIENT_CLINIC_OR_DEPARTMENT_OTHER): Payer: Self-pay | Admitting: Physical Therapy

## 2021-03-25 ENCOUNTER — Ambulatory Visit (HOSPITAL_BASED_OUTPATIENT_CLINIC_OR_DEPARTMENT_OTHER): Payer: Medicare Other | Attending: Podiatry | Admitting: Physical Therapy

## 2021-03-25 ENCOUNTER — Other Ambulatory Visit: Payer: Self-pay

## 2021-03-25 DIAGNOSIS — X58XXXD Exposure to other specified factors, subsequent encounter: Secondary | ICD-10-CM | POA: Diagnosis not present

## 2021-03-25 DIAGNOSIS — M25671 Stiffness of right ankle, not elsewhere classified: Secondary | ICD-10-CM | POA: Insufficient documentation

## 2021-03-25 DIAGNOSIS — E119 Type 2 diabetes mellitus without complications: Secondary | ICD-10-CM | POA: Insufficient documentation

## 2021-03-25 DIAGNOSIS — M25571 Pain in right ankle and joints of right foot: Secondary | ICD-10-CM | POA: Insufficient documentation

## 2021-03-25 DIAGNOSIS — Z9889 Other specified postprocedural states: Secondary | ICD-10-CM | POA: Diagnosis not present

## 2021-03-25 DIAGNOSIS — R2689 Other abnormalities of gait and mobility: Secondary | ICD-10-CM | POA: Insufficient documentation

## 2021-03-25 DIAGNOSIS — M1711 Unilateral primary osteoarthritis, right knee: Secondary | ICD-10-CM | POA: Insufficient documentation

## 2021-03-25 DIAGNOSIS — S86311D Strain of muscle(s) and tendon(s) of peroneal muscle group at lower leg level, right leg, subsequent encounter: Secondary | ICD-10-CM | POA: Diagnosis present

## 2021-03-25 NOTE — Therapy (Signed)
OUTPATIENT PHYSICAL THERAPY LOWER EXTREMITY EVALUATION   Patient Name: Gregory [name] MRN: [id number] DOB:1940-12-01, 80 y.o., male Today's Date: 03/25/2021   PT End of Session - 03/25/21 1451     Visit Number 1    Number of Visits 12    Date for PT Re-Evaluation 05/06/21    Authorization Type MCR A and B    PT Start Time 1345    PT Stop Time 1429    PT Time Calculation (min) 44 min    Activity Tolerance Patient tolerated treatment well    Behavior During Therapy WFL for tasks assessed/performed             Past Medical History:  Diagnosis Date   Arthritis    "right knee" (01/28/2016)   Diverticulosis of colon (without mention of hemorrhage)    Dysrhythmia    Internal hemorrhoids without mention of complication    Personal history of colonic polyps 01/17/2002   adenomatous, tublar adenoma 02/10/2007   PNA (pneumonia) 03/31/2012   Past Surgical History:  Procedure Laterality Date   ATRIAL FIBRILLATION ABLATION  11/28/2016   ATRIAL FIBRILLATION ABLATION N/A 11/28/2016   Procedure: Atrial Fibrillation Ablation;  Surgeon: Regan Lemming, MD;  Location: [facility];  Service: Cardiovascular;  Laterality: N/A;   ATRIAL FIBRILLATION ABLATION N/A 03/18/2019   Procedure: ATRIAL FIBRILLATION ABLATION;  Surgeon: Regan Lemming, MD;  Location: [facility];  Service: Cardiovascular;  Laterality: N/A;   CARDIOVERSION  05/03/2012   Procedure: CARDIOVERSION;  Surgeon: [name], MD;  Location: [facility] ENDOSCOPY;  Service: Cardiovascular;  Laterality: N/A;   CARDIOVERSION  06/08/2012   Procedure: CARDIOVERSION;  Surgeon: [name], MD;  Location: [facility] ENDOSCOPY;  Service: Cardiovascular;  Laterality: N/A;  Lawrence Marse[place] /janie   CARDIOVERSION N/A 10/28/2013   Procedure: CARDIOVERSION;  Surgeon: [name], MD;  Location: [facility] ENDOSCOPY;  Service: Cardiovascular;  Laterality: N/A;   CARDIOVERSION N/A 06/30/2014   Procedure: CARDIOVERSION;  Surgeon:  [name], MD;  Location: [facility] ENDOSCOPY;  Service: Cardiovascular;  Laterality: N/A;  H&P in file   CARDIOVERSION N/A 09/05/2014   Procedure: CARDIOVERSION;  Surgeon: [name], MD;  Location: [facility] ENDOSCOPY;  Service: Cardiovascular;  Laterality: N/A;   CARDIOVERSION N/A 12/24/2015   Procedure: CARDIOVERSION;  Surgeon: [name], MD;  Location: [facility] ENDOSCOPY;  Service: Cardiovascular;  Laterality: N/A;   CARDIOVERSION N/A 01/30/2016   Procedure: CARDIOVERSION;  Surgeon: [name], MD;  Location: [facility] ENDOSCOPY;  Service: Cardiovascular;  Laterality: N/A;   CARDIOVERSION N/A 02/08/2016   Procedure: CARDIOVERSION;  Surgeon: [name], MD;  Location: [facility] ENDOSCOPY;  Service: Cardiovascular;  Laterality: N/A;   CARDIOVERSION N/A 02/08/2019   Procedure: CARDIOVERSION;  Surgeon: [name], MD;  Location: [facility] ENDOSCOPY;  Service: Cardiovascular;  Laterality: N/A;   CARDIOVERSION N/A 05/24/2019   Procedure: CARDIOVERSION;  Surgeon: Parke Poisson, MD;  Location: [facility] ENDOSCOPY;  Service: Cardiovascular;  Laterality: N/A;   CARDIOVERSION N/A 09/21/2019   Procedure: CARDIOVERSION;  Surgeon: [name], MD;  Location: [facility] ENDOSCOPY;  Service: Cardiovascular;  Laterality: N/A;   CARDIOVERSION N/A 12/01/2019   Procedure: CARDIOVERSION;  Surgeon: [name], MD;  Location: [facility] ENDOSCOPY;  Service: Cardiovascular;  Laterality: N/A;   CATARACT EXTRACTION W/ INTRAOCULAR LENS  IMPLANT, BILATERAL Bilateral    COLONOSCOPY     INGUINAL HERNIA REPAIR Bilateral    as child   KNEE ARTHROSCOPY Right 2004   Patient Active Problem List   Diagnosis Date Noted   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 01/12/2020   BPH with elevated PSA 12/13/2019   Dermatitis 12/13/2019   Encounter for Medicare annual wellness exam 12/13/2019   Chronic pain of right knee 10/10/2019   Atypical atrial flutter (HCC) 05/17/2019   Acquired thrombophilia (HCC) 05/17/2019   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/06/2018   Primary  osteoarthritis of right knee 08/06/2018   Pes anserinus bursitis of left knee 10/31/2016   Allergic rhinitis 02/14/2016   Hearing loss 02/13/2016   CKD (chronic kidney disease) stage 3, GFR 30-59 ml/min (HCC) 02/13/2016   Atrial fibrillation (HCC) 02/07/2016   Hypertension 03/31/2012   Hemorrhoids, external 05/15/2011    PCP: [name], MD  REFERRING PROVIDER: [name], DPM  REFERRING DIAG:  [phone] (ICD-10-CM) - Tear of peroneal tendon, right, subsequent encounter  Z98.890 (ICD-10-CM) - Status post right foot surgery    THERAPY DIAG:  Right Ankle Pain/ Gait Abnormality   ONSET DATE: August 5th   SUBJECTIVE:   SUBJECTIVE STATEMENT: Patient had a history of right foot pain. He tried conservative measures but was found to have a near complete peroneal tear.  He had a repair August august 5th. He was in a boot for a 3 months prior to the surgery. He came out of the boot on 03/21/2021. He is wearing a compression sock and a triock brace at this time. He is currently using a cane.   PERTINENT HISTORY: Right knee OA; DMII   PAIN:  Are you having pain? Yes VAS scale: 3/10 Pain location: ankle, lateral foot, and pain  Pain orientation: Right  PAIN TYPE: aching Pain description: intermittent  Aggravating factors: weight bearing  Relieving factors: rest   PRECAUTIONS: Other: Progressive weight bearing  WEIGHT BEARING RESTRICTIONS Yes Partial with progression to WBAT per MD note  FALLS:  Has patient fallen in last 6 months? No,   LIVING ENVIRONMENT: Lives with: lives with their family Lives in: House/apartment Stairs: Yes; External: 3 steps;  Has following equipment at home: Single point cane has a scooter   OCCUPATION: retired Presenter, broadcasting: Likes walking;   PLOF: Independent  PATIENT GOALS    To get back to normal activity    OBJECTIVE:   DIAGNOSTIC FINDINGS: nothing postop    COGNITION:  Overall cognitive status: Within functional limits for tasks  assessed     SENSATION:  Light touch: Appears intact  Stereognosis: Appears intact  Hot/Cold: Appears intact  Proprioception: Appears intact Denies paresthesias   POSTURE:  Good posture   LE AROM/PROM:  AROM Right 03/25/2021 Left 03/25/2021  Hip flexion    Hip extension    Hip abduction    Hip adduction    Hip internal rotation    Hip external rotation    Knee flexion    Knee extension    Ankle dorsiflexion 3 12  Ankle plantarflexion    Ankle inversion 25 35  Ankle eversion 15 30   (Blank rows = not tested)   PROM Right 03/25/2021 Left 03/25/2021  Hip flexion    Hip extension    Hip abduction    Hip adduction    Hip internal rotation    Hip external rotation    Knee flexion    Knee extension    Ankle dorsiflexion 5 12  Ankle plantarflexion    Ankle inversion    Ankle eversion      LE MMT:  MMT Right 03/25/2021 Left 03/25/2021  Hip flexion 5/5 5/5  Hip extension  Hip abduction 5/5 5/5  Hip adduction 5/5 5/5  Hip internal rotation    Hip external rotation    Knee flexion    Knee extension    Ankle dorsiflexion 4+/5 5/5  Ankle plantarflexion Not tested   5/5  Ankle inversion 5/5 5/5   Ankle eversion 4+/5  5/5    (Blank rows = not tested)  LOWER EXTREMITY SPECIAL TESTS:   JOINT MOBILITY ASSESSMENT:   Decreased gross ankle movement in all planes    GAIT: Assistive device utilized: Single point cane Level of assistance: Modified independence Comments: shuffes at baseline; bilateral toe out.   Skin: small scab on lateral foot  TODAY'S TREATMENT:  Exercises Ankle Plantar Flexion with Resistance x20 red  Long Sitting Ankle Dorsiflexion with Anchored Resistance  Long Sitting Calf Stretch with Strap -   3x20 sec hold  Ankle Inversion Eversion Towel Slide -  x20      PATIENT EDUCATION:  Education details: reviewed strengthening exercises for stability; progression of activity; symptom management  Person educated: Patient Education  method: Explanation, Demonstration, Tactile cues, Verbal cues, and Handouts Education comprehension: verbalized understanding, returned demonstration, verbal cues required, and tactile cues required   HOME EXERCISE PROGRAM: Access Code: [email or web address] URL: [email or web address] Date: 03/25/2021 Prepared by: Lorayne Bender  Exercises Ankle Plantar Flexion with Resistance - 3 x daily - 7 x weekly - 10 reps - 3 sets Long Sitting Ankle Dorsiflexion with Anchored Resistance - 2 x daily - 7 x weekly - 10 reps - 3 sets Long Sitting Calf Stretch with Strap - 2 x daily - 7 x weekly - 3 reps - 1 sets - 20 hold Ankle Inversion Eversion Towel Slide - 2 x daily - 7 x weekly - 3 sets - 10 reps   ASSESSMENT:  CLINICAL IMPRESSION: Patient is a 80 y.o. male S/P right peroneal longus tendon repair on 01/18/2021. He presents with limited ankle motion and strength in all planes compared to the left side. He has increased pain in weight bearing across the top of his foot and in his lateral foot. He is currently wearing a brace and using a cane. He would benefit from skilled therapy to improve his ability to ambulate and walk in the community.   Abnormal gait, decreased activity tolerance, decreased mobility, difficulty walking, decreased ROM, decreased strength, and pain  cleaning, community activity, driving, meal prep, and yard work  1 comorbidity: right Knee OA; A-fib ( usually goes out of rhythm at night )    REHAB POTENTIAL: Good  CLINICAL DECISION MAKING: Stable/uncomplicated  EVALUATION COMPLEXITY: Low   GOALS: Goals reviewed with patient? Yes  SHORT TERM GOALS:  STG Name Target Date Goal status  1 Patient will increase passive right DF to 10 degrees  Baseline:  04/15/2021 INITIAL  2 Patient will amblate 100' without cane with good gait pattern and not pain  Baseline:  04/15/2021 INITIAL  3 Patient will demonstrate 5/5 gross bilateral LE strength  Baseline: 04/15/2021 INITIAL   LONG TERM GOALS:   LTG Name Target Date Goal status  1 Patient will go down steps without increased pain  Baseline: 05/06/2021 INITIAL  2 Patient will return to community ambulation without increased pain  Baseline: 05/06/2021 INITIAL  3 Patient will be independent with complete exercise program for gait and LE strength/stability  Baseline: 05/06/2021 INITIAL    PLAN: PT FREQUENCY: 1-2x/week  PT DURATION: 6 weeks  PLANNED INTERVENTIONS: Therapeutic exercises, Therapeutic activity, Neuro Muscular re-education, Balance training, Gait training, Patient/Family education,  Joint mobilization, Stair training, DME instructions, Aquatic Therapy, Electrical stimulation, Cryotherapy, Moist heat, scar mobilization, Taping, and Manual therapy  PLAN FOR NEXT SESSION: manual therapy to improve DF; add light weight bearing activity; slow march,weight shifting; gait training for heel to toe gait. May benefit from aquatics at some point, but the patient has a small scab that he needs to heal first. Consider general right LE strengthening as well; SLR bridging etc   Dessie Coma PT DPT  03/25/2021, 2:52 PM

## 2021-03-28 ENCOUNTER — Encounter: Payer: Medicare Other | Admitting: Podiatrist

## 2021-04-01 ENCOUNTER — Encounter (HOSPITAL_BASED_OUTPATIENT_CLINIC_OR_DEPARTMENT_OTHER): Payer: Self-pay | Admitting: Physical Therapy

## 2021-04-01 ENCOUNTER — Other Ambulatory Visit: Payer: Self-pay

## 2021-04-01 ENCOUNTER — Ambulatory Visit (HOSPITAL_BASED_OUTPATIENT_CLINIC_OR_DEPARTMENT_OTHER): Payer: Medicare Other | Admitting: Physical Therapy

## 2021-04-01 DIAGNOSIS — S86311D Strain of muscle(s) and tendon(s) of peroneal muscle group at lower leg level, right leg, subsequent encounter: Secondary | ICD-10-CM | POA: Diagnosis not present

## 2021-04-01 DIAGNOSIS — M25571 Pain in right ankle and joints of right foot: Secondary | ICD-10-CM

## 2021-04-01 DIAGNOSIS — R2689 Other abnormalities of gait and mobility: Secondary | ICD-10-CM

## 2021-04-01 DIAGNOSIS — M25671 Stiffness of right ankle, not elsewhere classified: Secondary | ICD-10-CM

## 2021-04-01 NOTE — Therapy (Signed)
OUTPATIENT PHYSICAL THERAPY TREATMENT NOTE   Patient Name: Gregory [name] MRN: [id number] DOB:07/26/1940, 80 y.o., male Today's Date: 04/02/2021  PCP: [name], MD REFERRING PROVIDER: [name], MD   PT End of Session - 04/02/21 0818     Visit Number 2    Number of Visits 12    Date for PT Re-Evaluation 05/06/21    Authorization Type MCR A and B    PT Start Time 1430    PT Stop Time 1510    PT Time Calculation (min) 40 min    Activity Tolerance Patient tolerated treatment well    Behavior During Therapy [facility] for tasks assessed/performed             Past Medical History:  Diagnosis Date   Arthritis    "right knee" (01/28/2016)   Diverticulosis of colon (without mention of hemorrhage)    Dysrhythmia    Internal hemorrhoids without mention of complication    Personal history of colonic polyps 01/17/2002   adenomatous, tublar adenoma 02/10/2007   PNA (pneumonia) 03/31/2012   Past Surgical History:  Procedure Laterality Date   ATRIAL FIBRILLATION ABLATION  11/28/2016   ATRIAL FIBRILLATION ABLATION N/A 11/28/2016   Procedure: Atrial Fibrillation Ablation;  Surgeon: Regan Lemming, MD;  [facility];  Service: Cardiovascular;  Laterality: N/A;   ATRIAL FIBRILLATION ABLATION N/A 03/18/2019   Procedure: ATRIAL FIBRILLATION ABLATION;  Surgeon: Regan Lemming, MD;  Location: [facility];  Service: Cardiovascular;  Laterality: N/A;   CARDIOVERSION  05/03/2012   Procedure: CARDIOVERSION;  Surgeon: [name], MD;  Location: [facility] ENDOSCOPY;  Service: Cardiovascular;  Laterality: N/A;   CARDIOVERSION  06/08/2012   Procedure: CARDIOVERSION;  Surgeon: [name], MD;  Location: [facility] ENDOSCOPY;  Service: Cardiovascular;  Laterality: N/A;  Lawrence Marseilles /janie   CARDIOVERSION N/A 10/28/2013   Procedure: CARDIOVERSION;  Surgeon: [name], MD;  Location: [facility] ENDOSCOPY;  Service: Cardiovascular;  Laterality: N/A;   CARDIOVERSION N/A  06/30/2014   Procedure: CARDIOVERSION;  Surgeon: [name], MD;  Location: [facility] ENDOSCOPY;  Service: Cardiovascular;  Laterality: N/A;  H&P in file   CARDIOVERSION N/A 09/05/2014   Procedure: CARDIOVERSION;  Surgeon: [name], MD;  Location: [facility] ENDOSCOPY;  Service: Cardiovascular;  Laterality: N/A;   CARDIOVERSION N/A 12/24/2015   Procedure: CARDIOVERSION;  Surgeon: [name], MD;  Location: [facility] ENDOSCOPY;  Service: Cardiovascular;  Laterality: N/A;   CARDIOVERSION N/A 01/30/2016   Procedure: CARDIOVERSION;  Surgeon: [name], MD;  Location: [facility] ENDOSCOPY;  Service: Cardiovascular;  Laterality: N/A;   CARDIOVERSION N/A 02/08/2016   Procedure: CARDIOVERSION;  Surgeon: [name], MD;  Location: [facility] ENDOSCOPY;  Service: Cardiovascular;  Laterality: N/A;   CARDIOVERSION N/A 02/08/2019   Procedure: CARDIOVERSION;  Surgeon: [name], MD;  Location: [facility] ENDOSCOPY;  Service: Cardiovascular;  Laterality: N/A;   CARDIOVERSION N/A 05/24/2019   Procedure: CARDIOVERSION;  Surgeon: Parke Poisson, MD;  Location: [facility] ENDOSCOPY;  Service: Cardiovascular;  Laterality: N/A;   CARDIOVERSION N/A 09/21/2019   Procedure: CARDIOVERSION;  Surgeon: [name], MD;  Location: [facility] ENDOSCOPY;  Service: Cardiovascular;  Laterality: N/A;   CARDIOVERSION N/A 12/01/2019   Procedure: CARDIOVERSION;  Surgeon: [name], MD;  Location: [facility] ENDOSCOPY;  Service: Cardiovascular;  Laterality: N/A;   CATARACT EXTRACTION W/ INTRAOCULAR LENS  IMPLANT, BILATERAL Bilateral    COLONOSCOPY     INGUINAL HERNIA REPAIR Bilateral    as child   KNEE ARTHROSCOPY Right 2004   Patient  Active Problem List   Diagnosis Date Noted   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 01/12/2020   BPH with elevated PSA 12/13/2019   Dermatitis 12/13/2019   Encounter for Medicare annual wellness exam 12/13/2019   Chronic pain of right knee 10/10/2019   Atypical atrial flutter (HCC) 05/17/2019   Acquired thrombophilia (HCC) 05/17/2019    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/06/2018   Primary osteoarthritis of right knee 08/06/2018   Pes anserinus bursitis of left knee 10/31/2016   Allergic rhinitis 02/14/2016   Hearing loss 02/13/2016   CKD (chronic kidney disease) stage 3, GFR 30-59 ml/min (HCC) 02/13/2016   Atrial fibrillation (HCC) 02/07/2016   Hypertension 03/31/2012   Hemorrhoids, external 05/15/2011    PCP: [name], MD   REFERRING PROVIDER: [name], DPM   REFERRING DIAG:  [phone] (ICD-10-CM) - Tear of peroneal tendon, right, subsequent encounter  Z98.890 (ICD-10-CM) - Status post right foot surgery      THERAPY DIAG:  Right Ankle Pain/ Gait Abnormality    ONSET DATE: August 5th    SUBJECTIVE:    SUBJECTIVE STATEMENT: Patient reports it is a little sore today. He reports when he walks on it it gets sore.  He can feel pain in the surgical site and across the top of his foot when he walks.  PERTINENT HISTORY: Right knee OA; DMII    PAIN:  Are you having pain? Yes VAS scale: 2/10 Pain location: ankle, lateral foot, and pain  Pain orientation: Right  PAIN TYPE: aching Pain description: intermittent  Aggravating factors: weight bearing  Relieving factors: rest    TODAY'S TREATMENT:   10/10 Exercises Ankle Plantar Flexion with Resistance x20 red  Long Sitting Ankle Dorsiflexion with Anchored Resistance  Long Sitting Calf Stretch with Strap -   3x20 sec hold  Ankle Inversion Eversion Towel Slide -  x20   10/17 Ankle Plantar Flexion with Resistance x20 red  Long Sitting Ankle Dorsiflexion with Anchored Resistance  Long Sitting Calf Stretch with Strap -   3x20 sec hold  Ankle Inversion Eversion Towel Slide -  x20  Ankle inversion red 2x20  Ankle eversion red 2x20  Standing march 2x10   SLR 3x10   Nu-step 5 min   Manual therapy: gentle stretching into dorsi flexion; soft tissue mobilization to the gastroc          PATIENT EDUCATION:  Education details: reviewed the reasoning behind  there-ex and weight bearing exercises  Person educated: Patient Education method: Explanation, Demonstration, Tactile cues, Verbal cues, and Handouts Education comprehension: verbalized understanding, returned demonstration, verbal cues required, and tactile cues required     HOME EXERCISE PROGRAM: Access Code: [email or web address] URL: [email or web address] Date: 03/25/2021 Prepared by: Lorayne Bender   Exercises Ankle Plantar Flexion with Resistance - 3 x daily - 7 x weekly - 10 reps - 3 sets Long Sitting Ankle Dorsiflexion with Anchored Resistance - 2 x daily - 7 x weekly - 10 reps - 3 sets Long Sitting Calf Stretch with Strap - 2 x daily - 7 x weekly - 3 reps - 1 sets - 20 hold Ankle Inversion Eversion Towel Slide - 2 x daily - 7 x weekly - 3 sets - 10 reps     ASSESSMENT:   CLINICAL IMPRESSION:   Patient is making good progress. His active DF improved significantly from the last visit. Therapy advanced his IN/EV strengthening without increased pain. He was given standing exercises for home.  Impairments: Abnormal gait, decreased activity tolerance, decreased mobility, difficulty walking, decreased ROM, decreased strength, and pain   Limitations cleaning, community activity, driving, meal prep, and yard work   Contributing factors: 1 comorbidity: right Knee OA; A-fib ( usually goes out of rhythm at night )       REHAB POTENTIAL: Good   CLINICAL DECISION MAKING: Stable/uncomplicated   EVALUATION COMPLEXITY: Low     GOALS: Goals reviewed with patient? Yes   SHORT TERM GOALS:   STG Name Target Date Goal status  1 Patient will increase passive right DF to 10 degrees  Baseline:  04/15/2021 INITIAL  2 Patient will amblate 100' without cane with good gait pattern and not pain  Baseline:  04/15/2021 INITIAL  3 Patient will demonstrate 5/5 gross bilateral LE strength  Baseline: 04/15/2021 INITIAL  LONG TERM GOALS:    LTG Name Target Date Goal status  1  Patient will go down steps without increased pain  Baseline: 05/06/2021 INITIAL  2 Patient will return to community ambulation without increased pain  Baseline: 05/06/2021 INITIAL  3 Patient will be independent with complete exercise program for gait and LE strength/stability  Baseline: 05/06/2021 INITIAL      PLAN: PT FREQUENCY: 1-2x/week   PT DURATION: 6 weeks   PLANNED INTERVENTIONS: Therapeutic exercises, Therapeutic activity, Neuro Muscular re-education, Balance training, Gait training, Patient/Family education, Joint mobilization, Stair training, DME instructions, Aquatic Therapy, Electrical stimulation, Cryotherapy, Moist heat, scar mobilization, Taping, and Manual therapy   PLAN FOR NEXT SESSION: manual therapy to improve DF; add light weight bearing activity; slow march,weight shifting; gait training for heel to toe gait. May benefit from aquatics at some point, but the patient has a small scab that he needs to heal first. Consider general right LE strengthening as well; SLR bridging etc      Dessie Coma PT DPT  04/02/2021, 8:26 AM

## 2021-04-02 ENCOUNTER — Encounter (HOSPITAL_BASED_OUTPATIENT_CLINIC_OR_DEPARTMENT_OTHER): Payer: Self-pay | Admitting: Physical Therapy

## 2021-04-08 ENCOUNTER — Encounter (HOSPITAL_BASED_OUTPATIENT_CLINIC_OR_DEPARTMENT_OTHER): Payer: Self-pay | Admitting: Physical Therapy

## 2021-04-08 ENCOUNTER — Other Ambulatory Visit: Payer: Self-pay

## 2021-04-08 ENCOUNTER — Encounter (HOSPITAL_BASED_OUTPATIENT_CLINIC_OR_DEPARTMENT_OTHER): Payer: Medicare Other | Attending: Family Medicine | Admitting: Physical Therapy

## 2021-04-08 DIAGNOSIS — M25571 Pain in right ankle and joints of right foot: Secondary | ICD-10-CM | POA: Diagnosis present

## 2021-04-08 DIAGNOSIS — M25671 Stiffness of right ankle, not elsewhere classified: Secondary | ICD-10-CM | POA: Diagnosis present

## 2021-04-08 DIAGNOSIS — R2689 Other abnormalities of gait and mobility: Secondary | ICD-10-CM

## 2021-04-08 NOTE — Therapy (Signed)
OUTPATIENT PHYSICAL THERAPY TREATMENT NOTE   Patient Name: Gregory [name] MRN: [id number] DOB:1940/12/16, 80 y.o., male Today's Date: 04/08/2021  PCP: [name], MD REFERRING PROVIDER: [name], MD    Past Medical History:  Diagnosis Date   Arthritis    "right knee" (01/28/2016)   Diverticulosis of colon (without mention of hemorrhage)    Dysrhythmia    Internal hemorrhoids without mention of complication    Personal history of colonic polyps 01/17/2002   adenomatous, tublar adenoma 02/10/2007   PNA (pneumonia) 03/31/2012   Past Surgical History:  Procedure Laterality Date   ATRIAL FIBRILLATION ABLATION  11/28/2016   ATRIAL FIBRILLATION ABLATION N/A 11/28/2016   Procedure: Atrial Fibrillation Ablation;  Surgeon: Regan Lemming, MD;  [facility]: [facility];  Service: Cardiovascular;  Laterality: N/A;   ATRIAL FIBRILLATION ABLATION N/A 03/18/2019   Procedure: ATRIAL FIBRILLATION ABLATION;  Surgeon: Regan Lemming, MD;  [facility]: [facility];  Service: Cardiovascular;  Laterality: N/A;   CARDIOVERSION  05/03/2012   Procedure: CARDIOVERSION;  Surgeon: [name], MD;  Location: [facility] ENDOSCOPY;  Service: Cardiovascular;  Laterality: N/A;   CARDIOVERSION  06/08/2012   Procedure: CARDIOVERSION;  Surgeon: [name], MD;  Location: [facility] ENDOSCOPY;  Service: Cardiovascular;  Laterality: N/A;  Lawrence Marseilles /janie   CARDIOVERSION N/A 10/28/2013   Procedure: CARDIOVERSION;  Surgeon: [name], MD;  Location: [facility] ENDOSCOPY;  Service: Cardiovascular;  Laterality: N/A;   CARDIOVERSION N/A 06/30/2014   Procedure: CARDIOVERSION;  Surgeon: [name], MD;  Location: [facility] ENDOSCOPY;  Service: Cardiovascular;  Laterality: N/A;  H&P in file   CARDIOVERSION N/A 09/05/2014   Procedure: CARDIOVERSION;  Surgeon: [name], MD;  Location: [facility] ENDOSCOPY;  Service: Cardiovascular;  Laterality: N/A;   CARDIOVERSION N/A 12/24/2015   Procedure: CARDIOVERSION;   Surgeon: [name], MD;  Location: [facility] ENDOSCOPY;  Service: Cardiovascular;  Laterality: N/A;   CARDIOVERSION N/A 01/30/2016   Procedure: CARDIOVERSION;  Surgeon: [name], MD;  Location: [facility] ENDOSCOPY;  Service: Cardiovascular;  Laterality: N/A;   CARDIOVERSION N/A 02/08/2016   Procedure: CARDIOVERSION;  Surgeon: [name], MD;  Location: [facility] ENDOSCOPY;  Service: Cardiovascular;  Laterality: N/A;   CARDIOVERSION N/A 02/08/2019   Procedure: CARDIOVERSION;  Surgeon: [name], MD;  Location: [facility] ENDOSCOPY;  Service: Cardiovascular;  Laterality: N/A;   CARDIOVERSION N/A 05/24/2019   Procedure: CARDIOVERSION;  Surgeon: Parke Poisson, MD;  Location: [facility] ENDOSCOPY;  Service: Cardiovascular;  Laterality: N/A;   CARDIOVERSION N/A 09/21/2019   Procedure: CARDIOVERSION;  Surgeon: [name], MD;  Location: [facility] ENDOSCOPY;  Service: Cardiovascular;  Laterality: N/A;   CARDIOVERSION N/A 12/01/2019   Procedure: CARDIOVERSION;  Surgeon: [name], MD;  Location: [facility] ENDOSCOPY;  Service: Cardiovascular;  Laterality: N/A;   CATARACT EXTRACTION W/ INTRAOCULAR LENS  IMPLANT, BILATERAL Bilateral    COLONOSCOPY     INGUINAL HERNIA REPAIR Bilateral    as child   KNEE ARTHROSCOPY Right 2004   Patient Active Problem List   Diagnosis Date Noted   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 01/12/2020   BPH with elevated PSA 12/13/2019   Dermatitis 12/13/2019   Encounter for Medicare annual wellness exam 12/13/2019   Chronic pain of right knee 10/10/2019   Atypical atrial flutter (HCC) 05/17/2019   Acquired thrombophilia (HCC) 05/17/2019   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/06/2018   Primary osteoarthritis of right knee 08/06/2018   Pes anserinus bursitis of left knee 10/31/2016   Allergic rhinitis 02/14/2016  Hearing loss 02/13/2016   CKD (chronic kidney disease) stage 3, GFR 30-59 ml/min (HCC) 02/13/2016   Atrial fibrillation (HCC) 02/07/2016   Hypertension 03/31/2012   Hemorrhoids, external 05/15/2011    PCP: [name], MD   REFERRING PROVIDER: [name], DPM   REFERRING DIAG:  [phone] (ICD-10-CM) - Tear of peroneal tendon, right, subsequent encounter  [phone] (ICD-10-CM) - Status post right foot surgery      THERAPY DIAG:  Right Ankle Pain/ Gait Abnormality    ONSET DATE: August 5th    SUBJECTIVE:    SUBJECTIVE STATEMENT: Patient feels like he may have over-sone it a bit last weekend. He helped take down a basketball hoop and it got a little sore.   PERTINENT HISTORY: Right knee OA; DMII    PAIN:  No real pain today. When he does have pain it is lateral     TODAY'S TREATMENT:   10/10 Exercises Ankle Plantar Flexion with Resistance x20 red  Long Sitting Ankle Dorsiflexion with Anchored Resistance  Long Sitting Calf Stretch with Strap -   3x20 sec hold  Ankle Inversion Eversion Towel Slide -  x20    10/17 Ankle Plantar Flexion with Resistance x20 red  Long Sitting Ankle Dorsiflexion with Anchored Resistance  Long Sitting Calf Stretch with Strap -   3x20 sec hold  Ankle Inversion Eversion Towel Slide -  x20  Ankle inversion red 2x20  Ankle eversion red 2x20  Standing march 2x10    SLR 3x10    Nu-step 5 min    Manual therapy: gentle stretching into dorsi flexion; soft tissue mobilization to the gastroc   10/24 Ankle Plantar Flexion with Resistance x20 red  Long Sitting Ankle Dorsiflexion with Anchored Resistance  Long Sitting Calf Stretch with Strap -   3x20 sec hold  Ankle Inversion Eversion Towel Slide -  x20  Ankle inversion red 2x20  Ankle eversion red 2x20  Standing march 2x10   Step onto air-ex 2x10;   Step up 2x10 4 uinch pain in right knee notes    Nu-step 5 min    Manual therapy: gentle stretching into dorsi flexion; soft tissue mobilization to the gastroc         PATIENT EDUCATION:  Education details: reviewed the reasoning behind there-ex and weight bearing exercises   Person educated: Patient Education method: Explanation,  Demonstration, Tactile cues, Verbal cues, and Handouts Education comprehension: verbalized understanding, returned demonstration, verbal cues required, and tactile cues required     HOME EXERCISE PROGRAM: Access Code: [email or web address] URL: [email or web address] Date: 03/25/2021 Prepared by: Lorayne Bender   Exercises Ankle Plantar Flexion with Resistance - 3 x daily - 7 x weekly - 10 reps - 3 sets Long Sitting Ankle Dorsiflexion with Anchored Resistance - 2 x daily - 7 x weekly - 10 reps - 3 sets Long Sitting Calf Stretch with Strap - 2 x daily - 7 x weekly - 3 reps - 1 sets - 20 hold Ankle Inversion Eversion Towel Slide - 2 x daily - 7 x weekly - 3 sets - 10 reps     ASSESSMENT:   CLINICAL IMPRESSION:    Patient is making great progress. His range of motion is approaching full per visual inspection. He is no longer using a cane. He tolerated exercises well today. Therapy added in a 4 inch step up as well as instability work. He had no increase pain in his ankle; With steps he did have some pain in his knee. Therapy will continue  to progress as tolerated.      Impairments: Abnormal gait, decreased activity tolerance, decreased mobility, difficulty walking, decreased ROM, decreased strength, and pain   Limitations cleaning, community activity, driving, meal prep, and yard work   Contributing factors: 1 comorbidity: right Knee OA; A-fib ( usually goes out of rhythm at night )       REHAB POTENTIAL: Good   CLINICAL DECISION MAKING: Stable/uncomplicated   EVALUATION COMPLEXITY: Low     GOALS: Goals reviewed with patient? Yes   SHORT TERM GOALS:   STG Name Target Date Goal status  1 Patient will increase passive right DF to 10 degrees  Baseline:  04/15/2021 INITIAL  2 Patient will amblate 100' without cane with good gait pattern and not pain  Baseline:  04/15/2021 INITIAL  3 Patient will demonstrate 5/5 gross bilateral LE strength  Baseline: 04/15/2021 INITIAL  LONG  TERM GOALS:    LTG Name Target Date Goal status  1 Patient will go down steps without increased pain  Baseline: 05/06/2021 INITIAL  2 Patient will return to community ambulation without increased pain  Baseline: 05/06/2021 INITIAL  3 Patient will be independent with complete exercise program for gait and LE strength/stability  Baseline: 05/06/2021 INITIAL      PLAN: PT FREQUENCY: 1-2x/week   PT DURATION: 6 weeks   PLANNED INTERVENTIONS: Therapeutic exercises, Therapeutic activity, Neuro Muscular re-education, Balance training, Gait training, Patient/Family education, Joint mobilization, Stair training, DME instructions, Aquatic Therapy, Electrical stimulation, Cryotherapy, Moist heat, scar mobilization, Taping, and Manual therapy   PLAN FOR NEXT SESSION: manual therapy to improve DF; add light weight bearing activity; slow march,weight shifting; gait training for heel to toe gait. May benefit from aquatics at some point, but the patient has a small scab that he needs to heal first. Consider general right LE strengthening as well; SLR bridging etc      Dessie Coma PT DPT  04/08/2021, 10:16 AM

## 2021-04-17 ENCOUNTER — Ambulatory Visit (HOSPITAL_BASED_OUTPATIENT_CLINIC_OR_DEPARTMENT_OTHER): Payer: Medicare Other | Attending: Podiatry | Admitting: Physical Therapy

## 2021-04-17 ENCOUNTER — Encounter (HOSPITAL_BASED_OUTPATIENT_CLINIC_OR_DEPARTMENT_OTHER): Payer: Self-pay | Admitting: Physical Therapy

## 2021-04-17 ENCOUNTER — Other Ambulatory Visit: Payer: Self-pay

## 2021-04-17 DIAGNOSIS — M25671 Stiffness of right ankle, not elsewhere classified: Secondary | ICD-10-CM | POA: Insufficient documentation

## 2021-04-17 DIAGNOSIS — M25571 Pain in right ankle and joints of right foot: Secondary | ICD-10-CM | POA: Diagnosis present

## 2021-04-17 DIAGNOSIS — R2689 Other abnormalities of gait and mobility: Secondary | ICD-10-CM | POA: Diagnosis present

## 2021-04-17 NOTE — Therapy (Signed)
OUTPATIENT PHYSICAL THERAPY TREATMENT NOTE   Patient Name: Gregory [name] MRN: [id number] DOB:May 06, 1941, 80 y.o., male Today's Date: 04/17/2021  PCP: Christain Sacramento, MD REFERRING PROVIDER: Christain Sacramento, MD   PT End of Session - 04/17/21 1145     Visit Number 4    Number of Visits 12    Date for PT Re-Evaluation 05/06/21    Authorization Type MCR A and B    PT Start Time [id number]    PT Stop Time 1057    PT Time Calculation (min) 42 min    Activity Tolerance Patient tolerated treatment well    Behavior During Therapy WFL for tasks assessed/performed             Past Medical History:  Diagnosis Date   Arthritis    "right knee" (01/28/2016)   Diverticulosis of colon (without mention of hemorrhage)    Dysrhythmia    Internal hemorrhoids without mention of complication    Personal history of colonic polyps 01/17/2002   adenomatous, tublar adenoma 02/10/2007   PNA (pneumonia) 03/31/2012   Past Surgical History:  Procedure Laterality Date   ATRIAL FIBRILLATION ABLATION  11/28/2016   ATRIAL FIBRILLATION ABLATION N/A 11/28/2016   Procedure: Atrial Fibrillation Ablation;  Surgeon: Constance Haw, MD;  Location: [facility];  Service: Cardiovascular;  Laterality: N/A;   ATRIAL FIBRILLATION ABLATION N/A 03/18/2019   Procedure: ATRIAL FIBRILLATION ABLATION;  Surgeon: Constance Haw, MD;  Location: [place] [facility];  Service: Cardiovascular;  Laterality: N/A;   CARDIOVERSION  05/03/2012   Procedure: CARDIOVERSION;  Surgeon: Laverda Page, MD;  Location: [place];  Service: Cardiovascular;  Laterality: N/A;   CARDIOVERSION  06/08/2012   Procedure: CARDIOVERSION;  Surgeon: Laverda Page, MD;  Location: [place];  Service: Cardiovascular;  Laterality: N/A;  [name]   CARDIOVERSION N/A 10/28/2013   Procedure: CARDIOVERSION;  Surgeon: Laverda Page, MD;  Location: [place];  Service: Cardiovascular;  Laterality: N/A;   CARDIOVERSION N/A  06/30/2014   Procedure: CARDIOVERSION;  Surgeon: Laverda Page, MD;  Location: [place];  Service: Cardiovascular;  Laterality: N/A;  H&P in file   CARDIOVERSION N/A 09/05/2014   Procedure: CARDIOVERSION;  Surgeon: Adrian Prows, MD;  Location: [place];  Service: Cardiovascular;  Laterality: N/A;   CARDIOVERSION N/A 12/24/2015   Procedure: CARDIOVERSION;  Surgeon: Adrian Prows, MD;  Location: [place];  Service: Cardiovascular;  Laterality: N/A;   CARDIOVERSION N/A 01/30/2016   Procedure: CARDIOVERSION;  Surgeon: Adrian Prows, MD;  Location: [place];  Service: Cardiovascular;  Laterality: N/A;   CARDIOVERSION N/A 02/08/2016   Procedure: CARDIOVERSION;  Surgeon: Adrian Prows, MD;  Location: [place];  Service: Cardiovascular;  Laterality: N/A;   CARDIOVERSION N/A 02/08/2019   Procedure: CARDIOVERSION;  Surgeon: Adrian Prows, MD;  Location: [place];  Service: Cardiovascular;  Laterality: N/A;   CARDIOVERSION N/A 05/24/2019   Procedure: CARDIOVERSION;  Surgeon: Elouise Munroe, MD;  Location: [facility];  Service: Cardiovascular;  Laterality: N/A;   CARDIOVERSION N/A 09/21/2019   Procedure: CARDIOVERSION;  Surgeon: Adrian Prows, MD;  Location: [place];  Service: Cardiovascular;  Laterality: N/A;   CARDIOVERSION N/A 12/01/2019   Procedure: CARDIOVERSION;  Surgeon: Adrian Prows, MD;  Location: [place];  Service: Cardiovascular;  Laterality: N/A;   CATARACT EXTRACTION W/ INTRAOCULAR LENS  IMPLANT, BILATERAL Bilateral    COLONOSCOPY     INGUINAL HERNIA REPAIR Bilateral    as child   KNEE ARTHROSCOPY Right 2004   Patient  Active Problem List   Diagnosis Date Noted   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 01/12/2020   BPH with elevated PSA 12/13/2019   Dermatitis 12/13/2019   Encounter for Medicare annual wellness exam 12/13/2019   Chronic pain of right knee 10/10/2019   Atypical atrial flutter (HCC) 05/17/2019   Acquired thrombophilia (HCC) 05/17/2019    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/06/2018   Primary osteoarthritis of right knee 08/06/2018   Pes anserinus bursitis of left knee 10/31/2016   Allergic rhinitis 02/14/2016   Hearing loss 02/13/2016   CKD (chronic kidney disease) stage 3, GFR 30-59 ml/min (HCC) 02/13/2016   Atrial fibrillation (HCC) 02/07/2016   Hypertension 03/31/2012   Hemorrhoids, external 05/15/2011   PCP: [name], MD   REFERRING PROVIDER: [name], DPM   REFERRING DIAG:  [phone] (ICD-10-CM) - Tear of peroneal tendon, right, subsequent encounter  Z98.890 (ICD-10-CM) - Status post right foot surgery      THERAPY DIAG:  Right Ankle Pain/ Gait Abnormality    ONSET DATE: August 5th    SUBJECTIVE:    SUBJECTIVE STATEMENT: Patient feels like his ankle is about the same. He did a lot of walking last night. His ankle got sore. His pain  PERTINENT HISTORY: Right knee OA; DMII    PAIN:   PAIN:  Are you having pain? Yes VAS scale: 2/10 Pain location: lateral foot  Pain orientation: Right  PAIN TYPE: aching Pain description: intermittent  Aggravating factors: standing and walking   Relieving factors: rest  PATIENT EDUCATION:  Education details: reviewed weight bearing activity  Person educated: Patient Education method: Explanation Education comprehension: verbalized understanding    TODAY'S TREATMENT:   10/17 Ankle Plantar Flexion with Resistance x20 red  Long Sitting Ankle Dorsiflexion with Anchored Resistance  Long Sitting Calf Stretch with Strap -   3x20 sec hold  Ankle Inversion Eversion Towel Slide -  x20  Ankle inversion red 2x20  Ankle eversion red 2x20  Standing march 2x10    SLR 3x10    Nu-step 5 min    Manual therapy: gentle stretching into dorsi flexion; soft tissue mobilization to the gastroc   10/24 Ankle Plantar Flexion with Resistance x20 red  Long Sitting Ankle Dorsiflexion with Anchored Resistance  Long Sitting Calf Stretch with Strap -   3x20 sec hold  Ankle  Inversion Eversion Towel Slide -  x20  Ankle inversion red 2x20  Ankle eversion red 2x20  Standing march 2x10    Step onto air-ex 2x10;    Step up 2x10 4 uinch pain in right knee notes    Nu-step 5 min    Manual therapy: gentle stretching into dorsi flexion; soft tissue mobilization to the gastroc   11/2   Ankle Plantar Flexion with Resistance x20 green  Long Sitting Ankle Dorsiflexion green Long Sitting Calf Stretch with Strap -   3x20 sec hold  Ankle Inversion Eversion Towel Slide -  x20  Ankle inversion  green 2x20  Ankle eversion green 2x20  Standing march 2x10    Step onto air-ex 2x10;    Step up 2x10 6 inch pain in right knee notes    Nu-step 5 min    Manual therapy: gentle stretching into dorsi flexion; soft tissue mobilization to the gastroc     PATIENT EDUCATION:  Education details: reviewed the reasoning behind there-ex and weight bearing exercises   Person educated: Patient Education method: Explanation, Demonstration, Tactile cues, Verbal cues, and Handouts Education comprehension: verbalized  understanding, returned demonstration, verbal cues required, and tactile cues required     HOME EXERCISE PROGRAM: Access Code: [email or web address] URL: [email or web address] Date: 03/25/2021 Prepared by: Carolyne Littles   Exercises Ankle Plantar Flexion with Resistance - 3 x daily - 7 x weekly - 10 reps - 3 sets Long Sitting Ankle Dorsiflexion with Anchored Resistance - 2 x daily - 7 x weekly - 10 reps - 3 sets Long Sitting Calf Stretch with Strap - 2 x daily - 7 x weekly - 3 reps - 1 sets - 20 hold Ankle Inversion Eversion Towel Slide - 2 x daily - 7 x weekly - 3 sets - 10 reps     ASSESSMENT:   CLINICAL IMPRESSION:    Patient continues to make progress. He is concerned about the lateral pain, but it is not bad and is most noticeable when he is standing and walking longer distances. He tolerated there-ex including the steps well today. He had mild pain with  steps, but his step was advanced to a 6 inch step today. Therapy will continue to progress as tolerated.     Impairments: Abnormal gait, decreased activity tolerance, decreased mobility, difficulty walking, decreased ROM, decreased strength, and pain   Limitations cleaning, community activity, driving, meal prep, and yard work   Contributing factors: 1 comorbidity: right Knee OA; A-fib ( usually goes out of rhythm at night )       REHAB POTENTIAL: Good   CLINICAL DECISION MAKING: Stable/uncomplicated   EVALUATION COMPLEXITY: Low     GOALS: Goals reviewed with patient? Yes   SHORT TERM GOALS:   STG Name Target Date Goal status  1 Patient will increase passive right DF to 10 degrees  Baseline:  04/15/2021 INITIAL  2 Patient will amblate 100' without cane with good gait pattern and not pain  Baseline:  04/15/2021 INITIAL  3 Patient will demonstrate 5/5 gross bilateral LE strength  Baseline: 04/15/2021 INITIAL  LONG TERM GOALS:    LTG Name Target Date Goal status  1 Patient will go down steps without increased pain  Baseline: 05/06/2021 INITIAL  2 Patient will return to community ambulation without increased pain  Baseline: 05/06/2021 INITIAL  3 Patient will be independent with complete exercise program for gait and LE strength/stability  Baseline: 05/06/2021 INITIAL      PLAN: PT FREQUENCY: 1-2x/week   PT DURATION: 6 weeks   PLANNED INTERVENTIONS: Therapeutic exercises, Therapeutic activity, Neuro Muscular re-education, Balance training, Gait training, Patient/Family education, Joint mobilization, Stair training, DME instructions, Aquatic Therapy, Electrical stimulation, Cryotherapy, Moist heat, scar mobilization, Taping, and Manual therapy   PLAN FOR NEXT SESSION: manual therapy to improve DF; add light weight bearing activity; slow march,weight shifting; gait training for heel to toe gait. May benefit from aquatics at some point, but the patient has a small scab that he  needs to heal first. Consider general right LE strengthening as well; SLR bridging etc      Carney Living PT DPT  04/17/2021, 11:51 AM

## 2021-04-22 ENCOUNTER — Encounter (HOSPITAL_BASED_OUTPATIENT_CLINIC_OR_DEPARTMENT_OTHER): Payer: Self-pay | Admitting: Physical Therapy

## 2021-04-22 ENCOUNTER — Other Ambulatory Visit: Payer: Self-pay

## 2021-04-22 ENCOUNTER — Ambulatory Visit (HOSPITAL_BASED_OUTPATIENT_CLINIC_OR_DEPARTMENT_OTHER): Payer: Medicare Other | Admitting: Physical Therapy

## 2021-04-22 DIAGNOSIS — R2689 Other abnormalities of gait and mobility: Secondary | ICD-10-CM

## 2021-04-22 DIAGNOSIS — M25571 Pain in right ankle and joints of right foot: Secondary | ICD-10-CM

## 2021-04-22 DIAGNOSIS — M25671 Stiffness of right ankle, not elsewhere classified: Secondary | ICD-10-CM

## 2021-04-22 NOTE — Therapy (Signed)
OUTPATIENT PHYSICAL THERAPY TREATMENT NOTE   Patient Name: Gregory [name] MRN: [id number] DOB:March 17, 1941, 80 y.o., male Today's Date: 04/22/2021  PCP: Christain Sacramento, MD REFERRING PROVIDER: Christain Sacramento, MD   PT End of Session - 04/22/21 1020     Visit Number 5    Number of Visits 12    Date for PT Re-Evaluation 05/06/21    Authorization Type MCR A and B    PT Start Time 1016    PT Stop Time 1057    PT Time Calculation (min) 41 min    Activity Tolerance Patient tolerated treatment well    Behavior During Therapy WFL for tasks assessed/performed             Past Medical History:  Diagnosis Date   Arthritis    "right knee" (01/28/2016)   Diverticulosis of colon (without mention of hemorrhage)    Dysrhythmia    Internal hemorrhoids without mention of complication    Personal history of colonic polyps 01/17/2002   adenomatous, tublar adenoma 02/10/2007   PNA (pneumonia) 03/31/2012   Past Surgical History:  Procedure Laterality Date   ATRIAL FIBRILLATION ABLATION  11/28/2016   ATRIAL FIBRILLATION ABLATION N/A 11/28/2016   Procedure: Atrial Fibrillation Ablation;  Surgeon: Constance Haw, MD;  Location: [place] [facility];  Service: Cardiovascular;  Laterality: N/A;   ATRIAL FIBRILLATION ABLATION N/A 03/18/2019   Procedure: ATRIAL FIBRILLATION ABLATION;  Surgeon: Constance Haw, MD;  Location: [place] [facility];  Service: Cardiovascular;  Laterality: N/A;   CARDIOVERSION  05/03/2012   Procedure: CARDIOVERSION;  Surgeon: Laverda Page, MD;  Location: [place];  Service: Cardiovascular;  Laterality: N/A;   CARDIOVERSION  06/08/2012   Procedure: CARDIOVERSION;  Surgeon: Laverda Page, MD;  Location: [place];  Service: Cardiovascular;  Laterality: N/A;  [name]   CARDIOVERSION N/A 10/28/2013   Procedure: CARDIOVERSION;  Surgeon: Laverda Page, MD;  Location: [place];  Service: Cardiovascular;  Laterality: N/A;   CARDIOVERSION N/A  06/30/2014   Procedure: CARDIOVERSION;  Surgeon: Laverda Page, MD;  Location: [place];  Service: Cardiovascular;  Laterality: N/A;  H&P in file   CARDIOVERSION N/A 09/05/2014   Procedure: CARDIOVERSION;  Surgeon: Adrian Prows, MD;  Location: [place];  Service: Cardiovascular;  Laterality: N/A;   CARDIOVERSION N/A 12/24/2015   Procedure: CARDIOVERSION;  Surgeon: Adrian Prows, MD;  Location: [place];  Service: Cardiovascular;  Laterality: N/A;   CARDIOVERSION N/A 01/30/2016   Procedure: CARDIOVERSION;  Surgeon: Adrian Prows, MD;  Location: [place];  Service: Cardiovascular;  Laterality: N/A;   CARDIOVERSION N/A 02/08/2016   Procedure: CARDIOVERSION;  Surgeon: Adrian Prows, MD;  Location: [place];  Service: Cardiovascular;  Laterality: N/A;   CARDIOVERSION N/A 02/08/2019   Procedure: CARDIOVERSION;  Surgeon: Adrian Prows, MD;  Location: [place];  Service: Cardiovascular;  Laterality: N/A;   CARDIOVERSION N/A 05/24/2019   Procedure: CARDIOVERSION;  Surgeon: Elouise Munroe, MD;  Location: [facility];  Service: Cardiovascular;  Laterality: N/A;   CARDIOVERSION N/A 09/21/2019   Procedure: CARDIOVERSION;  Surgeon: Adrian Prows, MD;  Location: [place];  Service: Cardiovascular;  Laterality: N/A;   CARDIOVERSION N/A 12/01/2019   Procedure: CARDIOVERSION;  Surgeon: Adrian Prows, MD;  Location: [place];  Service: Cardiovascular;  Laterality: N/A;   CATARACT EXTRACTION W/ INTRAOCULAR LENS  IMPLANT, BILATERAL Bilateral    COLONOSCOPY     INGUINAL HERNIA REPAIR Bilateral    as child   KNEE ARTHROSCOPY Right 2004   Patient  Active Problem List   Diagnosis Date Noted   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 01/12/2020   BPH with elevated PSA 12/13/2019   Dermatitis 12/13/2019   Encounter for Medicare annual wellness exam 12/13/2019   Chronic pain of right knee 10/10/2019   Atypical atrial flutter ([place]) 05/17/2019   Acquired thrombophilia ([place]) 05/17/2019    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/06/2018   Primary osteoarthritis of right knee 08/06/2018   Pes anserinus bursitis of left knee 10/31/2016   Allergic rhinitis 02/14/2016   Hearing loss 02/13/2016   CKD (chronic kidney disease) stage 3, GFR 30-59 ml/min (HCC) 02/13/2016   Atrial fibrillation ([place]) 02/07/2016   Hypertension 03/31/2012   Hemorrhoids, external 05/15/2011   PCP: Christain Sacramento, MD   REFERRING PROVIDER: Garrel Ridgel, DPM   REFERRING DIAG:  [phone] (ICD-10-CM) - Tear of peroneal tendon, right, subsequent encounter  Z98.890 (ICD-10-CM) - Status post right foot surgery      THERAPY DIAG:  Right Ankle Pain/ Gait Abnormality    ONSET DATE: August 5th    SUBJECTIVE:    SUBJECTIVE STATEMENT: Patient feels like his bankle has been a little sore, but overall it is doing well. No real change in pain.   PERTINENT HISTORY: Right knee OA; DMII    PAIN:    PAIN:  Are you having pain? Yes VAS scale: 2/10 Pain location: lateral foot  Pain orientation: Right  PAIN TYPE: aching Pain description: intermittent  Aggravating factors: standing and walking      Relieving factors: rest   PATIENT EDUCATION:  Education details: reviewed weight bearing activity  Person educated: Patient Education method: Explanation Education comprehension: verbalized understanding    TODAY'S TREATMENT:   10/24 Ankle Plantar Flexion with Resistance x20 red  Long Sitting Ankle Dorsiflexion with Anchored Resistance  Long Sitting Calf Stretch with Strap -   3x20 sec hold  Ankle Inversion Eversion Towel Slide -  x20  Ankle inversion red 2x20  Ankle eversion red 2x20  Standing march 2x10    Step onto air-ex 2x10;    Step up 2x10 4 uinch pain in right knee notes    Nu-step 5 min    Manual therapy: gentle stretching into dorsi flexion; soft tissue mobilization to the gastroc   11/2    Ankle Plantar Flexion with Resistance x20 green  Long Sitting Ankle Dorsiflexion green Long Sitting Calf  Stretch with Strap -   3x20 sec hold  Ankle Inversion Eversion Towel Slide -  x20  Ankle inversion  green 2x20  Ankle eversion green 2x20  Standing march 2x10    Step onto air-ex 2x10;    Step up 2x10 6 inch pain in right knee notes    Nu-step 5 min    Manual therapy: gentle stretching into dorsi flexion; soft tissue mobilization to the gastroc  11/7    Ankle Plantar Flexion with Resistance x20 green  Long Sitting Ankle Dorsiflexion green Long Sitting Calf Stretch with Strap -   3x20 sec hold  Ankle Inversion Eversion Towel Slide -  x20  Ankle inversion  green 2x20  Ankle eversion green 2x20  Standing march 2x10  SLR: 3x10   Step onto air-ex 2x10;    Step up 2x10 6 inch pain in right knee notes    Nu-step 5 min    Manual therapy: gentle stretching into dorsi flexion; soft tissue mobilization to the gastroc       PATIENT EDUCATION:  Education details: reviewed the reasoning  behind there-ex and weight bearing exercises   Person educated: Patient Education method: Explanation, Demonstration, Tactile cues, Verbal cues, and Handouts Education comprehension: verbalized understanding, returned demonstration, verbal cues required, and tactile cues required     HOME EXERCISE PROGRAM: Access Code: [email or web address] URL: [email or web address] Date: 03/25/2021 Prepared by: Lorayne Bender   Exercises Ankle Plantar Flexion with Resistance - 3 x daily - 7 x weekly - 10 reps - 3 sets Long Sitting Ankle Dorsiflexion with Anchored Resistance - 2 x daily - 7 x weekly - 10 reps - 3 sets Long Sitting Calf Stretch with Strap - 2 x daily - 7 x weekly - 3 reps - 1 sets - 20 hold Ankle Inversion Eversion Towel Slide - 2 x daily - 7 x weekly - 3 sets - 10 reps     ASSESSMENT:   CLINICAL IMPRESSION:    Patient had very little pain with treatment today. Per visual inspection his range has improved to nearly full. He has bvery little visible swelling at this time. He had mild  difficulty with instability exercises, but overall he tolerated treatment well. Therapy advanced him to a blue band for ankle PRE's. He will be going on vacation. When he returns he will call to schedule a follow up if needed.    Impairments: Abnormal gait, decreased activity tolerance, decreased mobility, difficulty walking, decreased ROM, decreased strength, and pain   Limitations cleaning, community activity, driving, meal prep, and yard work   Contributing factors: 1 comorbidity: right Knee OA; A-fib ( usually goes out of rhythm at night )       REHAB POTENTIAL: Good   CLINICAL DECISION MAKING: Stable/uncomplicated   EVALUATION COMPLEXITY: Low     GOALS: Goals reviewed with patient? Yes   SHORT TERM GOALS:   STG Name Target Date Goal status  1 Patient will increase passive right DF to 10 degrees  Baseline:  04/15/2021 INITIAL  2 Patient will amblate 100' without cane with good gait pattern and not pain  Baseline:  04/15/2021 INITIAL  3 Patient will demonstrate 5/5 gross bilateral LE strength  Baseline: 04/15/2021 INITIAL  LONG TERM GOALS:    LTG Name Target Date Goal status  1 Patient will go down steps without increased pain  Baseline: 05/06/2021 INITIAL  2 Patient will return to community ambulation without increased pain  Baseline: 05/06/2021 INITIAL  3 Patient will be independent with complete exercise program for gait and LE strength/stability  Baseline: 05/06/2021 INITIAL      PLAN: PT FREQUENCY: 1-2x/week   PT DURATION: 6 weeks   PLANNED INTERVENTIONS: Therapeutic exercises, Therapeutic activity, Neuro Muscular re-education, Balance training, Gait training, Patient/Family education, Joint mobilization, Stair training, DME instructions, Aquatic Therapy, Electrical stimulation, Cryotherapy, Moist heat, scar mobilization, Taping, and Manual therapy   PLAN FOR NEXT SESSION: manual therapy to improve DF; add light weight bearing activity; slow march,weight  shifting; gait training for heel to toe gait. May benefit from aquatics at some point, but the patient has a small scab that he needs to heal first. Consider general right LE strengthening as well; SLR bridging etc       Dessie Coma PT DPT  04/22/2021, 12:47 PM

## 2021-05-02 ENCOUNTER — Other Ambulatory Visit: Payer: Self-pay

## 2021-05-02 ENCOUNTER — Ambulatory Visit (HOSPITAL_BASED_OUTPATIENT_CLINIC_OR_DEPARTMENT_OTHER): Payer: Medicare Other | Admitting: Physical Therapy

## 2021-05-02 DIAGNOSIS — R2689 Other abnormalities of gait and mobility: Secondary | ICD-10-CM

## 2021-05-02 DIAGNOSIS — M25571 Pain in right ankle and joints of right foot: Secondary | ICD-10-CM

## 2021-05-02 DIAGNOSIS — M25671 Stiffness of right ankle, not elsewhere classified: Secondary | ICD-10-CM

## 2021-05-03 ENCOUNTER — Encounter (HOSPITAL_BASED_OUTPATIENT_CLINIC_OR_DEPARTMENT_OTHER): Payer: Self-pay | Admitting: Physical Therapy

## 2021-05-03 NOTE — Therapy (Signed)
OUTPATIENT PHYSICAL THERAPY TREATMENT NOTE   Patient Name: Gregory [name] MRN: [id number] DOB:Aug 31, 1940, 80 y.o., male Today's Date: 05/03/2021  PCP: Gregory Sacramento, MD REFERRING PROVIDER: Christain Sacramento, MD   PT End of Session - 05/02/21 1403     Visit Number 6    Number of Visits 12    Date for PT Re-Evaluation 05/06/21    Authorization Type MCR A and B    PT Start Time 1315    PT Stop Time 1357    PT Time Calculation (min) 42 min    Activity Tolerance Patient tolerated treatment well    Behavior During Therapy [facility] for tasks assessed/performed             Past Medical History:  Diagnosis Date   Arthritis    "right knee" (01/28/2016)   Diverticulosis of colon (without mention of hemorrhage)    Dysrhythmia    Internal hemorrhoids without mention of complication    Personal history of colonic polyps 01/17/2002   adenomatous, tublar adenoma 02/10/2007   PNA (pneumonia) 03/31/2012   Past Surgical History:  Procedure Laterality Date   ATRIAL FIBRILLATION ABLATION  11/28/2016   ATRIAL FIBRILLATION ABLATION N/A 11/28/2016   Procedure: Atrial Fibrillation Ablation;  Surgeon: Constance Haw, MD;  Location: [place] [facility];  Service: Cardiovascular;  Laterality: N/A;   ATRIAL FIBRILLATION ABLATION N/A 03/18/2019   Procedure: ATRIAL FIBRILLATION ABLATION;  Surgeon: Constance Haw, MD;  Location: [place] CV [facility];  Service: Cardiovascular;  Laterality: N/A;   CARDIOVERSION  05/03/2012   Procedure: CARDIOVERSION;  Surgeon: Laverda Page, MD;  Location: [place];  Service: Cardiovascular;  Laterality: N/A;   CARDIOVERSION  06/08/2012   Procedure: CARDIOVERSION;  Surgeon: Laverda Page, MD;  Location: [place];  Service: Cardiovascular;  Laterality: N/A;  [name]   CARDIOVERSION N/A 10/28/2013   Procedure: CARDIOVERSION;  Surgeon: Laverda Page, MD;  Location: [place];  Service: Cardiovascular;  Laterality: N/A;   CARDIOVERSION N/A  06/30/2014   Procedure: CARDIOVERSION;  Surgeon: Laverda Page, MD;  Location: [place];  Service: Cardiovascular;  Laterality: N/A;  H&P in file   CARDIOVERSION N/A 09/05/2014   Procedure: CARDIOVERSION;  Surgeon: Adrian Prows, MD;  Location: [place];  Service: Cardiovascular;  Laterality: N/A;   CARDIOVERSION N/A 12/24/2015   Procedure: CARDIOVERSION;  Surgeon: Adrian Prows, MD;  Location: [place];  Service: Cardiovascular;  Laterality: N/A;   CARDIOVERSION N/A 01/30/2016   Procedure: CARDIOVERSION;  Surgeon: Adrian Prows, MD;  Location: [place];  Service: Cardiovascular;  Laterality: N/A;   CARDIOVERSION N/A 02/08/2016   Procedure: CARDIOVERSION;  Surgeon: Adrian Prows, MD;  Location: [place];  Service: Cardiovascular;  Laterality: N/A;   CARDIOVERSION N/A 02/08/2019   Procedure: CARDIOVERSION;  Surgeon: Adrian Prows, MD;  Location: [place];  Service: Cardiovascular;  Laterality: N/A;   CARDIOVERSION N/A 05/24/2019   Procedure: CARDIOVERSION;  Surgeon: Elouise Munroe, MD;  Location: [facility] ENDOSCOPY;  Service: Cardiovascular;  Laterality: N/A;   CARDIOVERSION N/A 09/21/2019   Procedure: CARDIOVERSION;  Surgeon: Adrian Prows, MD;  Location: [place];  Service: Cardiovascular;  Laterality: N/A;   CARDIOVERSION N/A 12/01/2019   Procedure: CARDIOVERSION;  Surgeon: Adrian Prows, MD;  Location: [place];  Service: Cardiovascular;  Laterality: N/A;   CATARACT EXTRACTION W/ INTRAOCULAR LENS  IMPLANT, BILATERAL Bilateral    COLONOSCOPY     INGUINAL HERNIA REPAIR Bilateral    as child   KNEE ARTHROSCOPY Right 2004   Patient  Active Problem List   Diagnosis Date Noted   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 01/12/2020   BPH with elevated PSA 12/13/2019   Dermatitis 12/13/2019   Encounter for Medicare annual wellness exam 12/13/2019   Chronic pain of right knee 10/10/2019   Atypical atrial flutter ([place]) 05/17/2019   Acquired thrombophilia ([place]) 05/17/2019    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/06/2018   Primary osteoarthritis of right knee 08/06/2018   Pes anserinus bursitis of left knee 10/31/2016   Allergic rhinitis 02/14/2016   Hearing loss 02/13/2016   CKD (chronic kidney disease) stage 3, GFR 30-59 ml/min (HCC) 02/13/2016   Atrial fibrillation ([place]) 02/07/2016   Hypertension 03/31/2012   Hemorrhoids, external 05/15/2011  PCP: Gregory Sacramento, MD   REFERRING PROVIDER: Garrel [name], DPM   REFERRING DIAG:  [id number] (ICD-10-CM) - Tear of peroneal tendon, right, subsequent encounter  Z98.890 (ICD-10-CM) - Status post right foot surgery      THERAPY DIAG:  Right Ankle Pain/ Gait Abnormality    ONSET DATE: August 5th    SUBJECTIVE:    SUBJECTIVE STATEMENT: Patient reports that his ankle has been more sore. He did a lot of driving and traveling over the last week. He feels pain on top of the ankle and on the side. He reports he has not been able to do his exercises as regularly as he would like.    PERTINENT HISTORY: Right knee OA; DMII    PAIN:    PAIN:  Are you having pain? Yes VAS scale: 4/10 Pain location: lateral foot  Pain orientation: Right  PAIN TYPE: aching Pain description: intermittent  Aggravating factors: standing and walking      Relieving factors: rest   PATIENT EDUCATION:  Education details: reviewed weight bearing activity  Person educated: Patient Education method: Explanation Education comprehension: verbalized understanding    TODAY'S TREATMENT:     11/2    Ankle Plantar Flexion with Resistance x20 green  Long Sitting Ankle Dorsiflexion green Long Sitting Calf Stretch with Strap -   3x20 sec hold  Ankle Inversion Eversion Towel Slide -  x20  Ankle inversion  green 2x20  Ankle eversion green 2x20  Standing march 2x10    Step onto air-ex 2x10;    Step up 2x10 6 inch pain in right knee notes    Nu-step 5 min    Manual therapy: gentle stretching into dorsi flexion; soft tissue mobilization to the  gastroc   11/7     Ankle Plantar Flexion with Resistance x20 green  Long Sitting Ankle Dorsiflexion green Long Sitting Calf Stretch with Strap -   3x20 sec hold  Ankle Inversion Eversion Towel Slide -  x20  Ankle inversion  green 2x20  Ankle eversion green 2x20  Standing march 2x10  SLR: 3x10   Step onto air-ex 2x10;    Step up 2x10 6 inch pain in right knee notes    Nu-step 5 min    Manual therapy: gentle stretching into dorsi flexion; soft tissue mobilization to the gastroc  11/17  Ankle Plantar Flexion with Resistance x20 green  Long Sitting Ankle Dorsiflexion green Long Sitting Calf Stretch with Strap -   3x20 sec hold  Ankle Inversion Eversion Towel Slide -  x20  Ankle inversion  green 2x20  Ankle eversion green 2x20  Standing march 2x10  SLR: 3x10 Seated heel raise with pressure x20  Seated windshield wiper x20     Step onto air-ex 2x10;  Step up 2x10 6 inch pain in right knee notes    Nu-step 5 min    Manual therapy: gentle stretching into dorsi flexion; soft tissue mobilization to the gastroc        PATIENT EDUCATION:  Education details: reviewed the reasoning behind there-ex and weight bearing exercises   Person educated: Patient Education method: Explanation, Demonstration, Tactile cues, Verbal cues, and Handouts Education comprehension: verbalized understanding, returned demonstration, verbal cues required, and tactile cues required     HOME EXERCISE PROGRAM: Access Code: [email or web address] URL: [email or web address] Date: 03/25/2021 Prepared by: Lorayne Bender   Exercises Ankle Plantar Flexion with Resistance - 3 x daily - 7 x weekly - 10 reps - 3 sets Long Sitting Ankle Dorsiflexion with Anchored Resistance - 2 x daily - 7 x weekly - 10 reps - 3 sets Long Sitting Calf Stretch with Strap - 2 x daily - 7 x weekly - 3 reps - 1 sets - 20 hold Ankle Inversion Eversion Towel Slide - 2 x daily - 7 x weekly - 3 sets - 10 reps      ASSESSMENT:   CLINICAL IMPRESSION:    Patient had more pain today. He had full range per visual inspection but hi remains painful. It is difficult to tell if this is just from over-doing it, or if it is just generally painful/ Some visits he does well. Some visits he is in pain.  He will be more consistent over the next few weeks with his exercises. Therapy increased band to blue band. We continue to work on stability exercises and steps. He may schedule a follow up visit> he will call back.   Impairments: Abnormal gait, decreased activity tolerance, decreased mobility, difficulty walking, decreased ROM, decreased strength, and pain   Limitations cleaning, community activity, driving, meal prep, and yard work   Contributing factors: 1 comorbidity: right Knee OA; A-fib ( usually goes out of rhythm at night )       REHAB POTENTIAL: Good   CLINICAL DECISION MAKING: Stable/uncomplicated   EVALUATION COMPLEXITY: Low     GOALS: Goals reviewed with patient? Yes   SHORT TERM GOALS:   STG Name Target Date Goal status  1 Patient will increase passive right DF to 10 degrees  Baseline:  04/15/2021 INITIAL  2 Patient will amblate 100' without cane with good gait pattern and not pain  Baseline:  04/15/2021 INITIAL  3 Patient will demonstrate 5/5 gross bilateral LE strength  Baseline: 04/15/2021 INITIAL  LONG TERM GOALS:    LTG Name Target Date Goal status  1 Patient will go down steps without increased pain  Baseline: 05/06/2021 INITIAL  2 Patient will return to community ambulation without increased pain  Baseline: 05/06/2021 INITIAL  3 Patient will be independent with complete exercise program for gait and LE strength/stability  Baseline: 05/06/2021 INITIAL      PLAN: PT FREQUENCY: 1-2x/week   PT DURATION: 6 weeks   PLANNED INTERVENTIONS: Therapeutic exercises, Therapeutic activity, Neuro Muscular re-education, Balance training, Gait training, Patient/Family education, Joint  mobilization, Stair training, DME instructions, Aquatic Therapy, Electrical stimulation, Cryotherapy, Moist heat, scar mobilization, Taping, and Manual therapy   PLAN FOR NEXT SESSION: manual therapy to improve DF; add light weight bearing activity; slow march,weight shifting; gait training for heel to toe gait. May benefit from aquatics at some point, but the patient has a small scab that he needs to heal first. Consider general right LE strengthening as well; SLR bridging etc    Dessie Coma  PT DPT  05/03/2021, 12:57 PM

## 2021-05-08 ENCOUNTER — Other Ambulatory Visit: Payer: Self-pay

## 2021-05-08 ENCOUNTER — Ambulatory Visit (HOSPITAL_BASED_OUTPATIENT_CLINIC_OR_DEPARTMENT_OTHER): Payer: Medicare Other | Admitting: Physical Therapy

## 2021-05-08 ENCOUNTER — Encounter (HOSPITAL_BASED_OUTPATIENT_CLINIC_OR_DEPARTMENT_OTHER): Payer: Self-pay | Admitting: Physical Therapy

## 2021-05-08 DIAGNOSIS — R2689 Other abnormalities of gait and mobility: Secondary | ICD-10-CM | POA: Diagnosis not present

## 2021-05-08 DIAGNOSIS — M25571 Pain in right ankle and joints of right foot: Secondary | ICD-10-CM

## 2021-05-08 DIAGNOSIS — M25671 Stiffness of right ankle, not elsewhere classified: Secondary | ICD-10-CM

## 2021-05-08 NOTE — Therapy (Signed)
OUTPATIENT PHYSICAL THERAPY TREATMENT NOTE   Patient Name: BLAKE QUEENAN MRN: [id number] DOB:[birth year], 80 y.o., male Today's Date: 05/08/2021  PCP: Christain Sacramento, MD REFERRING PROVIDER: Christain Sacramento, MD   PT End of Session - 05/08/21 1306     Visit Number 7    Number of Visits 12    Date for PT Re-Evaluation 05/06/21    Authorization Type MCR A and B    PT Start Time [id number]    PT Stop Time 1343    PT Time Calculation (min) 40 min    Activity Tolerance Patient tolerated treatment well    Behavior During Therapy [facility] for tasks assessed/performed             Past Medical History:  Diagnosis Date   Arthritis    "right knee" (01/28/2016)   Diverticulosis of colon (without mention of hemorrhage)    Dysrhythmia    Internal hemorrhoids without mention of complication    Personal history of colonic polyps 01/17/2002   adenomatous, tublar adenoma 02/10/2007   PNA (pneumonia) 03/31/2012   Past Surgical History:  Procedure Laterality Date   ATRIAL FIBRILLATION ABLATION  11/28/2016   ATRIAL FIBRILLATION ABLATION N/A 11/28/2016   Procedure: Atrial Fibrillation Ablation;  Surgeon: Constance Haw, MD;  Location: [place] CV LAB;  Service: Cardiovascular;  Laterality: N/A;   ATRIAL FIBRILLATION ABLATION N/A 03/18/2019   Procedure: ATRIAL FIBRILLATION ABLATION;  Surgeon: Constance Haw, MD;  Location: [place] CV LAB;  Service: Cardiovascular;  Laterality: N/A;   CARDIOVERSION  05/03/2012   Procedure: CARDIOVERSION;  Surgeon: Laverda Page, MD;  Location: [place];  Service: Cardiovascular;  Laterality: N/A;   CARDIOVERSION  06/08/2012   Procedure: CARDIOVERSION;  Surgeon: Laverda Page, MD;  Location: [place];  Service: Cardiovascular;  Laterality: N/A;  [name]   CARDIOVERSION N/A 10/28/2013   Procedure: CARDIOVERSION;  Surgeon: Laverda Page, MD;  Location: [place];  Service: Cardiovascular;  Laterality: N/A;   CARDIOVERSION N/A  06/30/2014   Procedure: CARDIOVERSION;  Surgeon: Laverda Page, MD;  Location: [place];  Service: Cardiovascular;  Laterality: N/A;  H&P in file   CARDIOVERSION N/A 09/05/2014   Procedure: CARDIOVERSION;  Surgeon: Adrian Prows, MD;  Location: [place];  Service: Cardiovascular;  Laterality: N/A;   CARDIOVERSION N/A 12/24/2015   Procedure: CARDIOVERSION;  Surgeon: Adrian Prows, MD;  Location: [place];  Service: Cardiovascular;  Laterality: N/A;   CARDIOVERSION N/A 01/30/2016   Procedure: CARDIOVERSION;  Surgeon: Adrian Prows, MD;  Location: [place];  Service: Cardiovascular;  Laterality: N/A;   CARDIOVERSION N/A 02/08/2016   Procedure: CARDIOVERSION;  Surgeon: Adrian Prows, MD;  Location: [place];  Service: Cardiovascular;  Laterality: N/A;   CARDIOVERSION N/A 02/08/2019   Procedure: CARDIOVERSION;  Surgeon: Adrian Prows, MD;  Location: [place];  Service: Cardiovascular;  Laterality: N/A;   CARDIOVERSION N/A 05/24/2019   Procedure: CARDIOVERSION;  Surgeon: Elouise Munroe, MD;  Location: [facility] ENDOSCOPY;  Service: Cardiovascular;  Laterality: N/A;   CARDIOVERSION N/A 09/21/2019   Procedure: CARDIOVERSION;  Surgeon: Adrian Prows, MD;  Location: [place];  Service: Cardiovascular;  Laterality: N/A;   CARDIOVERSION N/A 12/01/2019   Procedure: CARDIOVERSION;  Surgeon: Adrian Prows, MD;  Location: [place];  Service: Cardiovascular;  Laterality: N/A;   CATARACT EXTRACTION W/ INTRAOCULAR LENS  IMPLANT, BILATERAL Bilateral    COLONOSCOPY     INGUINAL HERNIA REPAIR Bilateral    as child   KNEE ARTHROSCOPY Right 2004   Patient  Active Problem List   Diagnosis Date Noted   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 01/12/2020   BPH with elevated PSA 12/13/2019   Dermatitis 12/13/2019   Encounter for Medicare annual wellness exam 12/13/2019   Chronic pain of right knee 10/10/2019   Atypical atrial flutter ([place]) 05/17/2019   Acquired thrombophilia ([place]) 05/17/2019    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/06/2018   Primary osteoarthritis of right knee 08/06/2018   Pes anserinus bursitis of left knee 10/31/2016   Allergic rhinitis 02/14/2016   Hearing loss 02/13/2016   CKD (chronic kidney disease) stage 3, GFR 30-59 ml/min (HCC) 02/13/2016   Atrial fibrillation ([place]) 02/07/2016   Hypertension 03/31/2012   Hemorrhoids, external 05/15/2011  PCP: Christain Sacramento, MD   REFERRING PROVIDER: Garrel Ridgel, DPM   REFERRING DIAG:  [phone] (ICD-10-CM) - Tear of peroneal tendon, right, subsequent encounter  Z98.890 (ICD-10-CM) - Status post right foot surgery      THERAPY DIAG:  Right Ankle Pain/ Gait Abnormality    ONSET DATE: August 5th    SUBJECTIVE:    SUBJECTIVE STATEMENT: Patient reports he has not been having much pain. When he does have pain he reports it feels like a minor ankle sprain. His knee has been hurting him more then his ankle.  PERTINENT HISTORY: Right knee OA; DMII    PAIN:    PAIN:  Nothing in the ankle. Intermittent knee pain  PATIENT EDUCATION:  Education details: reviewed weight bearing activity  Person educated: Patient Education method: Explanation Education comprehension: verbalized understanding    TODAY'S TREATMENT:     11/2    Ankle Plantar Flexion with Resistance x20 green  Long Sitting Ankle Dorsiflexion green Long Sitting Calf Stretch with Strap -   3x20 sec hold  Ankle Inversion Eversion Towel Slide -  x20  Ankle inversion  green 2x20  Ankle eversion green 2x20  Standing march 2x10    Step onto air-ex 2x10;    Step up 2x10 6 inch pain in right knee notes    Nu-step 5 min    Manual therapy: gentle stretching into dorsi flexion; soft tissue mobilization to the gastroc   11/7     Ankle Plantar Flexion with Resistance x20 green  Long Sitting Ankle Dorsiflexion green Long Sitting Calf Stretch with Strap -   3x20 sec hold  Ankle Inversion Eversion Towel Slide -  x20  Ankle inversion  green 2x20  Ankle  eversion green 2x20  Standing march 2x10  SLR: 3x10   Step onto air-ex 2x10;    Step up 2x10 6 inch pain in right knee notes    Nu-step 5 min    Manual therapy: gentle stretching into dorsi flexion; soft tissue mobilization to the gastroc   11/17   Ankle Plantar Flexion with Resistance x20 green  Long Sitting Ankle Dorsiflexion green Long Sitting Calf Stretch with Strap -   3x20 sec hold  Ankle Inversion Eversion Towel Slide -  x20  Ankle inversion  green 2x20  Ankle eversion green 2x20  Standing march 2x10  SLR: 3x10 Seated heel raise with pressure x20  Seated windshield wiper x20      Step onto air-ex 2x10;    Step up 2x10 6 inch pain in right knee notes    Nu-step 5 min    Manual therapy: gentle stretching into dorsi flexion; soft tissue mobilization to the gastroc  11/23 Nu-step 5 min    Manual therapy: gentle stretching into dorsi flexion;  soft tissue mobilization to the gastroc  Ankle 4 way blue x20  Step onto air-ex x20  Lateral step onto air-ex x20  Lateral step onto air-ex x20   Seated heel raise with pressure x20  Seated windshield wiper x20   Standing heel raise x20 Standing slow march x20  Step up x20 4 inch ( because of the knee  Side step x20 4 inch              PATIENT EDUCATION:  Education details: reviewed the reasoning behind there-ex and weight bearing exercises   Person educated: Patient Education method: Explanation, Demonstration, Tactile cues, Verbal cues, and Handouts Education comprehension: verbalized understanding, returned demonstration, verbal cues required, and tactile cues required     HOME EXERCISE PROGRAM: Access Code: [email or web address] URL: [email or web address] Date: 03/25/2021 Prepared by: Lorayne Bender   Exercises Ankle Plantar Flexion with Resistance - 3 x daily - 7 x weekly - 10 reps - 3 sets Long Sitting Ankle Dorsiflexion with Anchored Resistance - 2 x daily - 7 x weekly - 10 reps - 3 sets Long  Sitting Calf Stretch with Strap - 2 x daily - 7 x weekly - 3 reps - 1 sets - 20 hold Ankle Inversion Eversion Towel Slide - 2 x daily - 7 x weekly - 3 sets - 10 reps     ASSESSMENT:   CLINICAL IMPRESSION:  Patient had full range per visual inspection today. He was encouraged to continue with his stretching at home.  He did well with instability work and with steps. He is slightly limited by his knee. He will have that ooked at soon. When he comes back from [place] he would like to do a follow up. We will re-assess at that time.  Impairments: Abnormal gait, decreased activity tolerance, decreased mobility, difficulty walking, decreased ROM, decreased strength, and pain   Limitations cleaning, community activity, driving, meal prep, and yard work   Contributing factors: 1 comorbidity: right Knee OA; A-fib ( usually goes out of rhythm at night )       REHAB POTENTIAL: Good   CLINICAL DECISION MAKING: Stable/uncomplicated   EVALUATION COMPLEXITY: Low     GOALS: Goals reviewed with patient? Yes   SHORT TERM GOALS:   STG Name Target Date Goal status  1 Patient will increase passive right DF to 10 degrees  Baseline:  04/15/2021 INITIAL  2 Patient will amblate 100' without cane with good gait pattern and not pain  Baseline:  04/15/2021 INITIAL  3 Patient will demonstrate 5/5 gross bilateral LE strength  Baseline: 04/15/2021 INITIAL  LONG TERM GOALS:    LTG Name Target Date Goal status  1 Patient will go down steps without increased pain  Baseline: 05/06/2021 INITIAL  2 Patient will return to community ambulation without increased pain  Baseline: 05/06/2021 INITIAL  3 Patient will be independent with complete exercise program for gait and LE strength/stability  Baseline: 05/06/2021 INITIAL      PLAN: PT FREQUENCY: 1-2x/week   PT DURATION: 6 weeks   PLANNED INTERVENTIONS: Therapeutic exercises, Therapeutic activity, Neuro Muscular re-education, Balance training, Gait training,  Patient/Family education, Joint mobilization, Stair training, DME instructions, Aquatic Therapy, Electrical stimulation, Cryotherapy, Moist heat, scar mobilization, Taping, and Manual therapy   PLAN FOR NEXT SESSION: manual therapy to improve DF; add light weight bearing activity; slow march,weight shifting; gait training for heel to toe gait. May benefit from aquatics at some point, but the patient has a small scab that he needs to heal first.  Consider general right LE strengthening as well; SLR bridging etc     Carney Living PT DPT  05/08/2021, 1:41 PM

## 2021-05-13 ENCOUNTER — Other Ambulatory Visit: Payer: Self-pay | Admitting: Cardiology

## 2021-06-03 ENCOUNTER — Other Ambulatory Visit: Payer: Self-pay

## 2021-06-03 ENCOUNTER — Ambulatory Visit (HOSPITAL_BASED_OUTPATIENT_CLINIC_OR_DEPARTMENT_OTHER): Payer: Medicare Other | Attending: Podiatry | Admitting: Physical Therapy

## 2021-06-03 ENCOUNTER — Encounter (HOSPITAL_BASED_OUTPATIENT_CLINIC_OR_DEPARTMENT_OTHER): Payer: Self-pay | Admitting: Physical Therapy

## 2021-06-03 DIAGNOSIS — R2689 Other abnormalities of gait and mobility: Secondary | ICD-10-CM | POA: Diagnosis present

## 2021-06-03 DIAGNOSIS — M25671 Stiffness of right ankle, not elsewhere classified: Secondary | ICD-10-CM | POA: Diagnosis present

## 2021-06-03 DIAGNOSIS — M25571 Pain in right ankle and joints of right foot: Secondary | ICD-10-CM | POA: Insufficient documentation

## 2021-06-03 NOTE — Therapy (Signed)
OUTPATIENT PHYSICAL THERAPY TREATMENT NOTE   Patient Name: Gregory [name] MRN: [id number] DOB:10/13/1940, 80 y.o., male Today's Date: 06/03/2021  PCP: Christain Sacramento, MD REFERRING PROVIDER: Christain Sacramento, MD   PT End of Session - 06/03/21 1437     Visit Number 8    Number of Visits 12    Date for PT Re-Evaluation 05/06/21    Authorization Type MCR A and B    PT Start Time 1430    PT Stop Time 1512    PT Time Calculation (min) 42 min    Activity Tolerance Patient tolerated treatment well    Behavior During Therapy [facility] for tasks assessed/performed             Past Medical History:  Diagnosis Date   Arthritis    "right knee" (01/28/2016)   Diverticulosis of colon (without mention of hemorrhage)    Dysrhythmia    Internal hemorrhoids without mention of complication    Personal history of colonic polyps 01/17/2002   adenomatous, tublar adenoma 02/10/2007   PNA (pneumonia) 03/31/2012   Past Surgical History:  Procedure Laterality Date   ATRIAL FIBRILLATION ABLATION  11/28/2016   ATRIAL FIBRILLATION ABLATION N/A 11/28/2016   Procedure: Atrial Fibrillation Ablation;  Surgeon: Constance Haw, MD;  Location: [place] [facility];  Service: Cardiovascular;  Laterality: N/A;   ATRIAL FIBRILLATION ABLATION N/A 03/18/2019   Procedure: ATRIAL FIBRILLATION ABLATION;  Surgeon: Constance Haw, MD;  Location: [place] [facility];  Service: Cardiovascular;  Laterality: N/A;   CARDIOVERSION  05/03/2012   Procedure: CARDIOVERSION;  Surgeon: Laverda Page, MD;  Location: [place];  Service: Cardiovascular;  Laterality: N/A;   CARDIOVERSION  06/08/2012   Procedure: CARDIOVERSION;  Surgeon: Laverda Page, MD;  Location: [place];  Service: Cardiovascular;  Laterality: N/A;  [name]   CARDIOVERSION N/A 10/28/2013   Procedure: CARDIOVERSION;  Surgeon: Laverda Page, MD;  Location: [place];  Service: Cardiovascular;  Laterality: N/A;   CARDIOVERSION N/A  06/30/2014   Procedure: CARDIOVERSION;  Surgeon: Laverda Page, MD;  Location: [place];  Service: Cardiovascular;  Laterality: N/A;  H&P in file   CARDIOVERSION N/A 09/05/2014   Procedure: CARDIOVERSION;  Surgeon: Adrian Prows, MD;  Location: [place];  Service: Cardiovascular;  Laterality: N/A;   CARDIOVERSION N/A 12/24/2015   Procedure: CARDIOVERSION;  Surgeon: Adrian Prows, MD;  Location: [place];  Service: Cardiovascular;  Laterality: N/A;   CARDIOVERSION N/A 01/30/2016   Procedure: CARDIOVERSION;  Surgeon: Adrian Prows, MD;  Location: [place];  Service: Cardiovascular;  Laterality: N/A;   CARDIOVERSION N/A 02/08/2016   Procedure: CARDIOVERSION;  Surgeon: Adrian Prows, MD;  Location: [place];  Service: Cardiovascular;  Laterality: N/A;   CARDIOVERSION N/A 02/08/2019   Procedure: CARDIOVERSION;  Surgeon: Adrian Prows, MD;  Location: [place];  Service: Cardiovascular;  Laterality: N/A;   CARDIOVERSION N/A 05/24/2019   Procedure: CARDIOVERSION;  Surgeon: Elouise Munroe, MD;  Location: [facility];  Service: Cardiovascular;  Laterality: N/A;   CARDIOVERSION N/A 09/21/2019   Procedure: CARDIOVERSION;  Surgeon: Adrian Prows, MD;  Location: [place];  Service: Cardiovascular;  Laterality: N/A;   CARDIOVERSION N/A 12/01/2019   Procedure: CARDIOVERSION;  Surgeon: Adrian Prows, MD;  Location: [place];  Service: Cardiovascular;  Laterality: N/A;   CATARACT EXTRACTION W/ INTRAOCULAR LENS  IMPLANT, BILATERAL Bilateral    COLONOSCOPY     INGUINAL HERNIA REPAIR Bilateral    as child   KNEE ARTHROSCOPY Right 2004   Patient  Active Problem List   Diagnosis Date Noted   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 01/12/2020   BPH with elevated PSA 12/13/2019   Dermatitis 12/13/2019   Encounter for Medicare annual wellness exam 12/13/2019   Chronic pain of right knee 10/10/2019   Atypical atrial flutter ([place]) 05/17/2019   Acquired thrombophilia ([place]) 05/17/2019    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/06/2018   Primary osteoarthritis of right knee 08/06/2018   Pes anserinus bursitis of left knee 10/31/2016   Allergic rhinitis 02/14/2016   Hearing loss 02/13/2016   CKD (chronic kidney disease) stage 3, GFR 30-59 ml/min (HCC) 02/13/2016   Atrial fibrillation ([place]) 02/07/2016   Hypertension 03/31/2012   Hemorrhoids, external 05/15/2011   PCP: Christain Sacramento, MD   REFERRING PROVIDER: Garrel Ridgel, DPM   REFERRING DIAG:  [phone] (ICD-10-CM) - Tear of peroneal tendon, right, subsequent encounter  Z98.890 (ICD-10-CM) - Status post right foot surgery      THERAPY DIAG:  Right Ankle Pain/ Gait Abnormality    ONSET DATE: August 5th    SUBJECTIVE:    SUBJECTIVE STATEMENT: Patient reports he has not been having much pain. When he does have pain he reports it feels like a minor ankle sprain. His knee has been hurting him more then his ankle.  PERTINENT HISTORY: Right knee OA; DMII    PAIN:    PAIN:  Nothing in the ankle. Intermittent knee pain  PATIENT EDUCATION:  Education details: reviewed weight bearing activity  Person educated: Patient Education method: Explanation Education comprehension: verbalized understanding    TODAY'S TREATMENT:     11/2    Ankle Plantar Flexion with Resistance x20 green  Long Sitting Ankle Dorsiflexion green Long Sitting Calf Stretch with Strap -   3x20 sec hold  Ankle Inversion Eversion Towel Slide -  x20  Ankle inversion  green 2x20  Ankle eversion green 2x20  Standing march 2x10    Step onto air-ex 2x10;    Step up 2x10 6 inch pain in right knee notes    Nu-step 5 min    Manual therapy: gentle stretching into dorsi flexion; soft tissue mobilization to the gastroc         11/7     Ankle Plantar Flexion with Resistance x20 green  Long Sitting Ankle Dorsiflexion green Long Sitting Calf Stretch with Strap -   3x20 sec hold  Ankle Inversion Eversion Towel Slide -  x20  Ankle inversion  green 2x20   Ankle eversion green 2x20  Standing march 2x10  SLR: 3x10   Step onto air-ex 2x10;    Step up 2x10 6 inch pain in right knee notes    Nu-step 5 min    Manual therapy: gentle stretching into dorsi flexion; soft tissue mobilization to the gastroc   11/17   Ankle Plantar Flexion with Resistance x20 green  Long Sitting Ankle Dorsiflexion green Long Sitting Calf Stretch with Strap -   3x20 sec hold  Ankle Inversion Eversion Towel Slide -  x20  Ankle inversion  green 2x20  Ankle eversion green 2x20  Standing march 2x10  SLR: 3x10 Seated heel raise with pressure x20  Seated windshield wiper x20      Step onto air-ex 2x10;    Step up 2x10 6 inch pain in right knee notes    Nu-step 5 min    Manual therapy: gentle stretching into dorsi flexion; soft tissue mobilization to the gastroc   11/23 Nu-step 5 min  Manual therapy: gentle stretching into dorsi flexion; soft tissue mobilization to the gastroc   Ankle 4 way blue x20  Step onto air-ex x20  Lateral step onto air-ex x20  Lateral step onto air-ex x20    Seated heel raise with pressure x20  Seated windshield wiper x20    Standing heel raise x20 Standing slow march x20  Step up x20 4 inch ( because of the knee  Side step x20 4 inch     12/19  Ankle 4 way blue x20  SLR 3x10 right  Quad set 2x15 right  Bridging x20 could feel it a little bit in his ankle   Heel raies x20 without support   Tandem stance 2x20 sec eyes open and closed Narrow base 2x20 of support eyes open and eyes closed.  Reviewed final HEP and how to progress at home     LE AROM/PROM:   AROM Right 03/25/2021 Left 03/25/2021  Hip flexion      Hip extension      Hip abduction      Hip adduction      Hip internal rotation      Hip external rotation      Knee flexion      Knee extension      Ankle dorsiflexion 12 12  Ankle plantarflexion      Ankle inversion 30 35  Ankle eversion 25 30   (Blank rows = not tested)     PROM  Right 03/25/2021 Left 03/25/2021  Hip flexion      Hip extension      Hip abduction      Hip adduction      Hip internal rotation      Hip external rotation      Knee flexion      Knee extension      Ankle dorsiflexion 5 12  Ankle plantarflexion      Ankle inversion      Ankle eversion          LE MMT:   MMT Right 03/25/2021 Left 03/25/2021  Hip flexion 5/5 5/5  Hip extension      Hip abduction 5/5 5/5  Hip adduction 5/5 5/5  Hip internal rotation      Hip external rotation      Knee flexion      Knee extension      Ankle dorsiflexion 5/5 5/5  Ankle plantarflexion Not tested   5/5  Ankle inversion 5/5 5/5   Ankle eversion 5/5 5/5    (Blank rows = not tested)          PATIENT EDUCATION:  Education details: reviewed the reasoning behind there-ex and weight bearing exercises   Person educated: Patient Education method: Explanation, Demonstration, Tactile cues, Verbal cues, and Handouts Education comprehension: verbalized understanding, returned demonstration, verbal cues required, and tactile cues required     HOME EXERCISE PROGRAM: Access Code: [email or web address] URL: [email or web address] Date: 03/25/2021 Prepared by: Carolyne Littles   Exercises Ankle Plantar Flexion with Resistance - 3 x daily - 7 x weekly - 10 reps - 3 sets Long Sitting Ankle Dorsiflexion with Anchored Resistance - 2 x daily - 7 x weekly - 10 reps - 3 sets Long Sitting Calf Stretch with Strap - 2 x daily - 7 x weekly - 3 reps - 1 sets - 20 hold Ankle Inversion Eversion Towel Slide - 2 x daily - 7 x weekly - 3 sets - 10 reps     ASSESSMENT:  CLINICAL IMPRESSION:  The patient is doing well. He has full range and strength. He feels like if he keeps doing his exercises he will continue to progress. He has felt like his balance has been off. He was given balance exercises for his program. Therapy also reviewed basic knee exercises. He feels like his knee is effecting his balance. He was  given an HEP. At this time we will D/C to HEP.See below for goals specific progress.    Impairments: Abnormal gait, decreased activity tolerance, decreased mobility, difficulty walking, decreased ROM, decreased strength, and pain   Limitations cleaning, community activity, driving, meal prep, and yard work   Contributing factors: 1 comorbidity: right Knee OA; A-fib ( usually goes out of rhythm at night )       REHAB POTENTIAL: Good   CLINICAL DECISION MAKING: Stable/uncomplicated   EVALUATION COMPLEXITY: Low     PHYSICAL THERAPY DISCHARGE SUMMARY  Visits from Start of Care: 8  Current functional level related to goals / functional outcomes: Significant improvement in pin and ability to walk   Remaining deficits: Mild pain at times, balance deficits    Education / Equipment: Final HEP   Patient agrees to discharge. Patient goals were met. Patient is being discharged due to meeting the stated rehab goals.   GOALS: Goals reviewed with patient? Yes   SHORT TERM GOALS:   STG Name Target Date Goal status  1 Patient will increase passive right DF to 10 degrees  Baseline:  04/15/2021 Full  Achieved   2 Patient will amblate 100' without cane with good gait pattern and not pain  Baseline:  04/15/2021 Ambulated a few miles in [place]   3 Patient will demonstrate 5/5 gross bilateral LE strength  Baseline: 04/15/2021 5/5 gross   LONG TERM GOALS:    LTG Name Target Date Goal status  1 Patient will go down steps without increased pain  Baseline: 05/06/2021 Mild Pain at times but otherwise doing well   Achieved   2 Patient will return to community ambulation without increased pain  Baseline: 05/06/2021 Ambualting in the community and as a hobbie  Achieved   3 Patient will be independent with complete exercise program for gait and LE strength/stability  Baseline: 05/06/2021 Independent with HEP       PLAN: PT FREQUENCY: 1-2x/week   PT DURATION: 6 weeks    PLANNED INTERVENTIONS: Therapeutic exercises, Therapeutic activity, Neuro Muscular re-education, Balance training, Gait training, Patient/Family education, Joint mobilization, Stair training, DME instructions, Aquatic Therapy, Electrical stimulation, Cryotherapy, Moist heat, scar mobilization, Taping, and Manual therapy   PLAN FOR NEXT SESSION: manual therapy to improve DF; add light weight bearing activity; slow march,weight shifting; gait training for heel to toe gait. May benefit from aquatics at some point, but the patient has a small scab that he needs to heal first. Consider general right LE strengthening as well; SLR bridging etc          Carney Living PT DPT  06/03/2021, 2:41 PM

## 2021-06-12 ENCOUNTER — Encounter: Payer: Self-pay | Admitting: Cardiology

## 2021-06-18 ENCOUNTER — Encounter: Payer: Self-pay | Admitting: Cardiology

## 2021-06-18 NOTE — Telephone Encounter (Signed)
From pt

## 2021-06-20 NOTE — Telephone Encounter (Signed)
From patient.

## 2021-06-26 ENCOUNTER — Encounter: Payer: Self-pay | Admitting: Gastroenterology

## 2021-07-03 ENCOUNTER — Encounter: Payer: Self-pay | Admitting: Physician Assistant

## 2021-07-08 ENCOUNTER — Encounter: Payer: Self-pay | Admitting: Cardiology

## 2021-07-08 ENCOUNTER — Ambulatory Visit: Payer: Medicare Other | Admitting: Cardiology

## 2021-07-08 ENCOUNTER — Other Ambulatory Visit: Payer: Self-pay

## 2021-07-08 ENCOUNTER — Other Ambulatory Visit: Payer: Self-pay | Admitting: Cardiology

## 2021-07-08 VITALS — BP 138/64 | HR 64 | Temp 97.8°F | Resp 16 | Ht 70.0 in | Wt 191.4 lb

## 2021-07-08 DIAGNOSIS — I480 Paroxysmal atrial fibrillation: Secondary | ICD-10-CM

## 2021-07-08 DIAGNOSIS — Z79899 Other long term (current) drug therapy: Secondary | ICD-10-CM

## 2021-07-08 DIAGNOSIS — N1832 Chronic kidney disease, stage 3b: Secondary | ICD-10-CM

## 2021-07-08 DIAGNOSIS — I10 Essential (primary) hypertension: Secondary | ICD-10-CM

## 2021-07-08 LAB — MAGNESIUM: Magnesium: 2.3 mg/dL (ref 1.5–2.5)

## 2021-07-08 NOTE — Progress Notes (Signed)
Primary Physician/Referring:  Christain Sacramento, MD  Patient ID: Gregory [name], male    DOB: 03/21/1941, 81 y.o.   MRN: [id number]  Chief Complaint  Patient presents with   Atrial Fibrillation   Hypertension   Follow-up    6 months   HPI:    Gregory [name]  is an 81 y.o. Caucasian male with  history of stage 3b CKD, hypertension, mild hyperlipidemia and moderate CAD by coronary CTA, paroxysmal atrial fibrillation. He has history of multiple cardioversions and eventually dofetilide was discontinued by Dr. [name] due to efficacy, and was placed on  Multaq and has maintained sinus since Dec 2018. Due to recurrence of atrial fibrillation, he underwent 2nd ablation on 02/22/2019, 1st ablation being on 11/28/2016.  He had repeat cardioversion on 05/24/2019 and after Tikosyn initiation had cardioversion on 12/01/2019.   He is tolerating Tikosyn without any side effects.  He has had occasional episodes of atrial fibrillation. Otherwise he is presently doing well. No bleeding diathesis on Eliquis. No dizziness or syncope. Continues to remain active.  Past Medical History:  Diagnosis Date   Arthritis    "right knee" (01/28/2016)   Diverticulosis of colon (without mention of hemorrhage)    Dysrhythmia    Hyperlipidemia    Internal hemorrhoids without mention of complication    Personal history of colonic polyps 01/17/2002   adenomatous, tublar adenoma 02/10/2007   PNA (pneumonia) 03/31/2012   Past Surgical History:  Procedure Laterality Date   ATRIAL FIBRILLATION ABLATION  11/28/2016   ATRIAL FIBRILLATION ABLATION N/A 11/28/2016   Procedure: Atrial Fibrillation Ablation;  Surgeon: Constance Haw, MD;  Location: [place] [facility];  Service: Cardiovascular;  Laterality: N/A;   ATRIAL FIBRILLATION ABLATION N/A 03/18/2019   Procedure: ATRIAL FIBRILLATION ABLATION;  Surgeon: Constance Haw, MD;  Location: [place] [facility];  Service: Cardiovascular;  Laterality: N/A;   CARDIOVERSION   05/03/2012   Procedure: CARDIOVERSION;  Surgeon: Laverda Page, MD;  Location: [place];  Service: Cardiovascular;  Laterality: N/A;   CARDIOVERSION  06/08/2012   Procedure: CARDIOVERSION;  Surgeon: Laverda Page, MD;  Location: [place];  Service: Cardiovascular;  Laterality: N/A;  [name]   CARDIOVERSION N/A 10/28/2013   Procedure: CARDIOVERSION;  Surgeon: Laverda Page, MD;  Location: [place];  Service: Cardiovascular;  Laterality: N/A;   CARDIOVERSION N/A 06/30/2014   Procedure: CARDIOVERSION;  Surgeon: Laverda Page, MD;  Location: [place];  Service: Cardiovascular;  Laterality: N/A;  H&P in file   CARDIOVERSION N/A 09/05/2014   Procedure: CARDIOVERSION;  Surgeon: Adrian Prows, MD;  Location: [place];  Service: Cardiovascular;  Laterality: N/A;   CARDIOVERSION N/A 12/24/2015   Procedure: CARDIOVERSION;  Surgeon: Adrian Prows, MD;  Location: [place];  Service: Cardiovascular;  Laterality: N/A;   CARDIOVERSION N/A 01/30/2016   Procedure: CARDIOVERSION;  Surgeon: Adrian Prows, MD;  Location: [place];  Service: Cardiovascular;  Laterality: N/A;   CARDIOVERSION N/A 02/08/2016   Procedure: CARDIOVERSION;  Surgeon: Adrian Prows, MD;  Location: [facility];  Service: Cardiovascular;  Laterality: N/A;   CARDIOVERSION N/A 02/08/2019   Procedure: CARDIOVERSION;  Surgeon: Adrian Prows, MD;  Location: [place];  Service: Cardiovascular;  Laterality: N/A;   CARDIOVERSION N/A 05/24/2019   Procedure: CARDIOVERSION;  Surgeon: Elouise Munroe, MD;  Location: [facility];  Service: Cardiovascular;  Laterality: N/A;   CARDIOVERSION N/A 09/21/2019   Procedure: CARDIOVERSION;  Surgeon: Adrian Prows, MD;  Location: [place];  Service: Cardiovascular;  Laterality: N/A;  CARDIOVERSION N/A 12/01/2019   Procedure: CARDIOVERSION;  Surgeon: Adrian Prows, MD;  Location: [facility] ENDOSCOPY;  Service: Cardiovascular;  Laterality: N/A;   CATARACT EXTRACTION W/ INTRAOCULAR LENS   IMPLANT, BILATERAL Bilateral    COLONOSCOPY     INGUINAL HERNIA REPAIR Bilateral    as child   KNEE ARTHROSCOPY Right 2004   Family History  Problem Relation Age of Onset   Heart disease Father    Stroke Father    Cancer Mother        bladder   Colon cancer Neg Hx    Stomach cancer Neg Hx     Social History   Tobacco Use   Smoking status: Never   Smokeless tobacco: Never  Substance Use Topics   Alcohol use: Yes    Alcohol/week: 1.0 standard drink    Types: 1 Glasses of wine per week    Comment: daily   Marital Status: Married  ROS  Review of Systems  Constitutional: Negative for weight gain.  Cardiovascular:  Negative for dyspnea on exertion, leg swelling and syncope.  Respiratory:  Negative for shortness of breath.   Musculoskeletal:  Positive for joint pain (Right knee arthritis). Negative for joint swelling.  Objective  Blood pressure 138/64, pulse 64, temperature 97.8 F (36.6 C), temperature source Temporal, resp. rate 16, height _0  (1.778 m), weight 191 lb 6.4 oz (86.8 kg), SpO2 98 %.  Vitals with BMI 07/08/2021 03/13/2021 01/24/2021  Height _1  _2  -  Weight 191 lbs 6 oz 187 lbs 10 oz -  BMI 49.44 96.75 -  Systolic 916 384 665  Diastolic 64 77 87  Pulse 64 57 74     Physical Exam Constitutional:      General: He is not in acute distress.    Appearance: He is well-developed.  Neck:     Vascular: No carotid bruit or JVD.  Cardiovascular:     Rate and Rhythm: Regular rhythm. Bradycardia present.     Pulses: Intact distal pulses.     Heart sounds: No murmur heard.   No gallop.  Pulmonary:     Effort: Pulmonary effort is normal. No accessory muscle usage or respiratory distress.     Breath sounds: Normal breath sounds.  Abdominal:     Palpations: Abdomen is soft.  Musculoskeletal:        General: Signs of injury (Right foot in cast) present.     Right lower leg: No edema.     Left lower leg: No edema.   Laboratory examination:   Recent Labs     07/10/20 0817 01/04/21 1010  NA 143 144  K 4.3 4.7  CL 107* 104  CO2 24 27  GLUCOSE 96 113*  BUN 20 22  CREATININE 1.46* 1.44*  CALCIUM 9.3 9.4  GFRNONAA 45*  --   GFRAA 53*  --    CrCl cannot be calculated (Patient's most recent lab result is older than the maximum 21 days allowed.).  CMP Latest Ref Rng & Units 01/04/2021 07/10/2020 12/29/2019  Glucose 65 - 99 mg/dL 113(H) 96 75  BUN 8 - 27 mg/dL 22 20 31(H)  Creatinine 0.76 - 1.27 mg/dL 1.44(H) 1.46(H) 1.42(H)  Sodium 134 - 144 mmol/L 144 143 138  Potassium 3.5 - 5.2 mmol/L 4.7 4.3 4.7  Chloride 96 - 106 mmol/L 104 107(H) 104  CO2 20 - 29 mmol/L _3 Calcium 8.6 - 10.2 mg/dL 9.4 9.3 9.2  Total Protein 6.0 - 8.5 g/dL 6.4 6.3 -  Total Bilirubin 0.0 - 1.2 mg/dL 0.5 0.5 -  Alkaline Phos 44 - 121 IU/L 55 55 -  AST 0 - 40 IU/L 15 14 -  ALT 0 - 44 IU/L 13 16 -   CBC Latest Ref Rng & Units 01/04/2021 07/10/2020 11/28/2019  WBC 3.4 - 10.8 x10E3/uL 5.0 4.6 4.7  Hemoglobin 13.0 - 17.7 g/dL 14.4 14.8 13.7  Hematocrit 37.5 - 51.0 % 43.0 44.5 41.6  Platelets 150 - 450 x10E3/uL 186 183 167   Lipid Panel     Component Value Date/Time   CHOL 134 07/10/2020 0817   TRIG 56 07/10/2020 0817   HDL 50 07/10/2020 0817   LDLCALC 72 07/10/2020 0817   HEMOGLOBIN A1C No results found for: HGBA1C, MPG TSH No results for input(s): TSH in the last 8760 hours.  External labs:   07/04/2019: PSA 4.900   06/18/2018: TSH 1.69.  Normal H and H, MCV 98, MCH 33, CBC otherwise normal.  Creatinine 1.82, EGFR 35/41, potassium 5.2, CMP otherwise normal.  09/28/2017: Creatinine 1.84, EGFR 35/41, potassium 5.6, CMP otherwise normal.  Normal H&H, MCV 99, CBC otherwise normal.  05/23/2015: Total cholesterol 208, triglycerides 113, HDL 53, LDL 132, creatinine 1.53, eGFR 44, potassium 4.6, CMP otherwise normal, CBC normal, PSA elevated at 5.3  Medications and allergies  No Known Allergies   Current Outpatient Medications on File Prior to Visit   Medication Sig Dispense Refill   acetaminophen (TYLENOL) 500 MG tablet Take 1,000 mg by mouth every 6 (six) hours as needed for moderate pain or headache.     diltiazem (TIAZAC) 120 MG 24 hr capsule Take 1 capsule (120 mg total) by mouth daily. 90 capsule 3   dofetilide (TIKOSYN) 250 MCG capsule Take 1 capsule (250 mcg total) by mouth 2 (two) times daily. 180 capsule 3   ELIQUIS 5 MG TABS tablet TAKE 1 TABLET(5 MG) BY MOUTH TWICE DAILY 180 tablet 3   lisinopril (ZESTRIL) 5 MG tablet TAKE 1 TABLET(5 MG) BY MOUTH DAILY 90 tablet 1   Magnesium Oxide 250 MG TABS Take 1 tablet (250 mg total) by mouth daily. 90 tablet 3   psyllium (METAMUCIL) 58.6 % packet Take 1 packet by mouth daily as needed (constipation).      rosuvastatin (CRESTOR) 5 MG tablet TAKE 1 TABLET(5 MG) BY MOUTH DAILY 90 tablet 3   oxyCODONE-acetaminophen (PERCOCET) 10-325 MG tablet Take 1 tablet by mouth as needed. (Patient not taking: Reported on 07/08/2021)     No current facility-administered medications on file prior to visit.    Radiology:   No results found.  Cardiac Studies:   Treadmill stress test  [10/09/2014]: Indication: Atrial Fibrillation Resting EKG SR with 1st degree AV Block, LAD, LAFB, RBBB. Trifascicular block. Normal BP response. There was no ST-T changes of ischemia with exercise stress test. No stress induced arrhythmias. Stress terminated due to THR (>85% MPHR)/MPHR met. The patient exercised according to Bruce Protocol, Total time recorded 06:50 min achieving max heart rate of 158 which was 106% of MPHR for age and 8.34 METS of work.  Coronary CTA 03/06/2019: 1. Coronary artery calcium score 1837 Agatston units. This places the patient in the 86th percentile for age and gender, suggesting high risk for future cardiac events. 2. Extensive calcified plaque throughout the coronary system. Mild stenosis for the most part, possibly around 50% stenosis in the distal RCA and the proximal LCx (small vessel). However,  difficult to quantify given extensive calcification with possibility of blooming artifact. Will send  for FFR. 3.  Pulmonary veins without stenosis.  4.  No LA appendage thrombus seen.  FFR 0.9 mid LAD, FFR 0.93 distal RCA, FFR 0.94 mid LCx: No evidence for hemodynamically significant stenosis.  Echocardiogram 09/29/2019:  Left ventricle cavity is normal in size. Mild concentric hypertrophy of the left ventricle. Normal global wall motion.  Normal LV systolic function  with EF 61%. Indeterminate diastolic filling pattern.  Left atrial cavity is severely dilated.  Moderate (Grade II) mitral regurgitation.  Moderate tricuspid regurgitation. Estimated pulmonary artery systolic pressure is 34 mmHg.  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  EKG: EKG 07/08/2021: Sinus rhythm with first-degree AV block at rate of 60 bpm, normal QT interval.  No evidence of ischemia.  No significant change from 03/13/2021.    Assessment     ICD-10-CM   1. Paroxysmal atrial fibrillation (HCC)  I48.0 EKG 12-Lead    2. Stage 3b chronic kidney disease (HCC)  N18.32     3. Essential hypertension  I10 Magnesium    4. High risk medication use  Z79.899      No orders of the defined types were placed in this encounter.     There are no discontinued medications.   Orders Placed This Encounter  Procedures   Magnesium   EKG 12-Lead     Recommendations:   Gregory [name]  is a 81 y.o. Caucasian male with  history of stage 3b CKD, hypertension, mild hyperlipidemia and moderate CAD by coronary CTA, paroxysmal atrial fibrillation. He has history of multiple cardioversions and eventually dofetilide was discontinued by Dr. [name] due to efficacy, and was placed on  Multaq and has maintained sinus since Dec 2018. Due to recurrence of atrial fibrillation, he underwent 2nd ablation on 02/22/2019, 1st ablation being on 11/28/2016.  He had repeat cardioversion on 05/24/2019 and after Tikosyn initiation had  cardioversion on 12/01/2019.   He is tolerating Tikosyn without any side effects.  He has had occasional episodes of atrial fibrillation, longest lasting 2 days.  He is tolerating anticoagulation without bleeding diathesis.  He is having partial right knee replacement so, preop note sent to the surgeon.  Labs are being obtained preop, I have added magnesium levels to this.  This is to follow-up on his renal function as well as he does have stage IIIa/B chronic kidney disease.  Blood pressure is well controlled.  He is presently on a statin for CAD and his lipids are at goal.  No changes in the medications were done today.  I will see him back in 6 months.    Adrian Prows, MD, [facility] 07/08/2021, 9:20 AM Office: [phone] Pager: [phone]

## 2021-07-22 ENCOUNTER — Encounter: Payer: Self-pay | Admitting: Physician Assistant

## 2021-07-22 ENCOUNTER — Ambulatory Visit (INDEPENDENT_AMBULATORY_CARE_PROVIDER_SITE_OTHER): Payer: Medicare Other | Admitting: Physician Assistant

## 2021-07-22 VITALS — BP 122/84 | HR 57 | Resp 18 | Ht 70.5 in | Wt 191.4 lb

## 2021-07-22 DIAGNOSIS — Z1211 Encounter for screening for malignant neoplasm of colon: Secondary | ICD-10-CM | POA: Diagnosis not present

## 2021-07-22 NOTE — Patient Instructions (Signed)
If you are age 80 or older, your body mass index should be between 23-30. Your Body mass index is 27.07 kg/m. If this is out of the aforementioned range listed, please consider follow up with your Primary Care Provider. ________________________________________________________  The [place] [facility] providers would like to encourage you to use [facility] to communicate with providers for non-urgent requests or questions.  Due to long hold times on the telephone, sending your provider a message by [facility] may be a faster and more efficient way to get a response.  Please allow 48 business hours for a response.  Please remember that this is for non-urgent requests.  _______________________________________________________  It has been recommended to you by your physician that you have a(n) Colonoscopy completed. Per your request, we did not schedule the procedure(s) today. Please contact our office at [phone] and ask for Beth, should you decide to have the procedure completed. You will be scheduled for a pre-visit and procedure at that time.  Thank you for entrusting me with your care and choosing [facility].  Amy Esterwood, PA-C

## 2021-07-22 NOTE — Progress Notes (Signed)
Agree with assessment and plan as outlined.  Agree that I do not feel he needs another colonoscopy at this point given age greater than 49 and no polyps on his last few exams, otherwise asymptomatic.

## 2021-07-22 NOTE — Progress Notes (Signed)
Subjective:    Patient ID: Gregory [name], male    DOB: [birth year], 81 y.o.   MRN: [id number]  HPI Gregory [name] is a pleasant 81 year old white male, who comes in today to discuss possible colonoscopy after receiving a recall letter in the mail.  His last colonoscopy was done in October 2012 per Dr. Sharlett [name] and was a normal exam.  He also had colonoscopy in 2008 which was a normal exam.  He had tubular adenomatous polyps in 2003. Patient has history of hypertension, osteoarthritis, atrial fibrillation for which she is on Eliquis, chronic kidney disease stage III, BPH and moderate coronary artery disease.  He has no specific GI complaints today.  He says that he will have intermittent mild problems with constipation, stays on Metamucil daily and uses Dulcolax as needed.  He has no complaints of abdominal pain, no changes in bowel habits, no melena or hematochezia. He is scheduled for upcoming knee replacement on August 07, 2021.  Family history is negative for colon cancer.  Review of Systems Pertinent positive and negative review of systems were noted in the above HPI section.  All other review of systems was otherwise negative.   Outpatient Encounter Medications as of 07/22/2021  Medication Sig   acetaminophen (TYLENOL) 500 MG tablet Take 1,000 mg by mouth every 6 (six) hours as needed for moderate pain or headache.   diltiazem (TIAZAC) 120 MG 24 hr capsule Take 1 capsule (120 mg total) by mouth daily.   dofetilide (TIKOSYN) 250 MCG capsule Take 1 capsule (250 mcg total) by mouth 2 (two) times daily.   ELIQUIS 5 MG TABS tablet TAKE 1 TABLET(5 MG) BY MOUTH TWICE DAILY   lisinopril (ZESTRIL) 5 MG tablet TAKE 1 TABLET(5 MG) BY MOUTH DAILY   Magnesium Oxide 250 MG TABS Take 1 tablet (250 mg total) by mouth daily.   psyllium (METAMUCIL) 58.6 % packet Take 1 packet by mouth daily as needed (constipation).    rosuvastatin (CRESTOR) 5 MG tablet TAKE 1 TABLET(5 MG) BY MOUTH DAILY    oxyCODONE-acetaminophen (PERCOCET) 10-325 MG tablet Take 1 tablet by mouth as needed. (Patient not taking: Reported on 07/08/2021)   No facility-administered encounter medications on file as of 07/22/2021.   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 01/12/2020   BPH with elevated PSA 12/13/2019   Dermatitis 12/13/2019   Encounter for Medicare annual wellness exam 12/13/2019   Chronic pain of right knee 10/10/2019   Atypical atrial flutter (Gregory [name]) 05/17/2019   Acquired thrombophilia ([place]) 05/17/2019   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/06/2018   Primary osteoarthritis of right knee 08/06/2018   Pes anserinus bursitis of left knee 10/31/2016   Allergic rhinitis 02/14/2016   Hearing loss 02/13/2016   CKD (chronic kidney disease) stage 3, GFR 30-59 ml/min (HCC) 02/13/2016   Atrial fibrillation ([place]) 02/07/2016   Hypertension 03/31/2012   Hemorrhoids, external 05/15/2011   Social History   Socioeconomic History   Marital status: Married    Spouse name: Not on file   Number of children: 2   Years of education: Not on file   Highest education level: Not on file  Occupational History    Employer: [employer]  Tobacco Use   Smoking status: Never   Smokeless tobacco: Never  Vaping Use   Vaping Use: Never used  Substance and Sexual Activity   Alcohol use: Yes    Alcohol/week: 1.0 standard drink  Types: 1 Glasses of wine per week    Comment: daily   Drug use: No   Sexual activity: Not Currently  Other Topics Concern   Not on file  Social History Narrative   Not on file   Social Determinants of Health   Financial Resource Strain: Not on file  Food Insecurity: Not on file  Transportation Needs: Not on file  Physical Activity: Not on file  Stress: Not on file  Social Connections: Not on file  Intimate Partner Violence: Not on file    Gregory [name] family history includes Bladder Cancer in his  mother; Cancer in his mother; Heart disease in his father; Stroke in his father.      Objective:    Vitals:   07/22/21 0923  BP: 122/84  Pulse: (!) 57  Resp: 18  SpO2: 94%    Physical Exam Well-developed well-nourished elderly white male in no acute distress.  Height, Weight, 191 BMI 27.07  HEENT; nontraumatic normocephalic, EOMI, PE R LA, sclera anicteric. Oropharynx; not examined today Neck; supple, no JVD  Extremities; no clubbing cyanosis or edema skin warm and dry Neuro/Psych; alert and oriented x4, grossly nonfocal mood and affect appropriate        Assessment & Plan:   #44 81 year old white male who comes in today for discussion about follow-up colonoscopy after receiving recall letter.  He had a normal colonoscopy in October 2012, and normal colonoscopy 2008.  Index colonoscopy in 2003 with adenomatous polyps.  Patient is asymptomatic, no specific GI complaints.  #2 chronic anticoagulation-on Eliquis 3.  History of atrial fibrillation 4.  Chronic kidney disease stage III 5.  Hypertension 6.  Moderate coronary artery disease 7.  Osteoarthritis  Plan; I discussed the general recommendation for discontinuing surveillance colonoscopies after age 55-80, and we discussed the rationale for this.  Patient has had 2 negative colonoscopies, and therefore lower risk for colonic neoplasm. In addition he is on chronic anticoagulation, Eliquis would need to be held for at least 48 hours prior to a colonoscopy. Patient does not feel strongly about pursuing another colonoscopy but would like to think about it.  At any rate he would not plan to move forward with colonoscopy until after he recovers from upcoming knee replacement. I have asked him to call back and speak to my nurse if he decides that he wants to pursue colonoscopy in 3 to 4 months.  Patient will be established with Dr. Havery [name].  Gregory Stirewalt S Gregory Huckeby PA-C 07/22/2021   Cc: Gregory Sacramento, MD

## 2021-08-01 ENCOUNTER — Other Ambulatory Visit: Payer: Self-pay | Admitting: Cardiology

## 2021-08-01 DIAGNOSIS — N1832 Chronic kidney disease, stage 3b: Secondary | ICD-10-CM

## 2021-08-07 ENCOUNTER — Encounter: Payer: Self-pay | Admitting: Cardiology

## 2021-08-07 DIAGNOSIS — I480 Paroxysmal atrial fibrillation: Secondary | ICD-10-CM

## 2021-08-16 ENCOUNTER — Ambulatory Visit (HOSPITAL_BASED_OUTPATIENT_CLINIC_OR_DEPARTMENT_OTHER): Payer: Self-pay | Admitting: Physical Therapy

## 2021-08-19 NOTE — Telephone Encounter (Signed)
Scheduled for DCCV tomorrow 08/20/21 at 8:15 AM ?

## 2021-08-19 NOTE — Telephone Encounter (Signed)
From patient.

## 2021-08-20 ENCOUNTER — Ambulatory Visit (HOSPITAL_BASED_OUTPATIENT_CLINIC_OR_DEPARTMENT_OTHER): Payer: Medicare Other | Admitting: Anesthesiology

## 2021-08-20 ENCOUNTER — Encounter (HOSPITAL_BASED_OUTPATIENT_CLINIC_OR_DEPARTMENT_OTHER): Payer: Self-pay | Admitting: Physical Therapy

## 2021-08-20 ENCOUNTER — Other Ambulatory Visit: Payer: Self-pay | Admitting: Cardiology

## 2021-08-20 ENCOUNTER — Encounter (HOSPITAL_COMMUNITY)
Admission: RE | Disposition: A | Discharge status: Home / Self Care | Payer: Self-pay | Source: Home / Self Care | Attending: Cardiology

## 2021-08-20 ENCOUNTER — Other Ambulatory Visit: Payer: Self-pay

## 2021-08-20 ENCOUNTER — Ambulatory Visit (HOSPITAL_COMMUNITY): Payer: Medicare Other | Admitting: Anesthesiology

## 2021-08-20 ENCOUNTER — Encounter (HOSPITAL_COMMUNITY): Payer: Self-pay | Admitting: Cardiology

## 2021-08-20 ENCOUNTER — Ambulatory Visit (HOSPITAL_COMMUNITY)
Admission: RE | Admit: 2021-08-20 | Discharge: 2021-08-20 | Disposition: A | Discharge status: Home / Self Care | Payer: Medicare Other | Attending: Cardiology | Admitting: Cardiology

## 2021-08-20 DIAGNOSIS — I10 Essential (primary) hypertension: Secondary | ICD-10-CM | POA: Diagnosis not present

## 2021-08-20 DIAGNOSIS — M1990 Unspecified osteoarthritis, unspecified site: Secondary | ICD-10-CM

## 2021-08-20 DIAGNOSIS — Z79899 Other long term (current) drug therapy: Secondary | ICD-10-CM | POA: Diagnosis not present

## 2021-08-20 DIAGNOSIS — G709 Myoneural disorder, unspecified: Secondary | ICD-10-CM | POA: Diagnosis not present

## 2021-08-20 DIAGNOSIS — I480 Paroxysmal atrial fibrillation: Secondary | ICD-10-CM | POA: Diagnosis present

## 2021-08-20 DIAGNOSIS — I2510 Atherosclerotic heart disease of native coronary artery without angina pectoris: Secondary | ICD-10-CM | POA: Diagnosis not present

## 2021-08-20 DIAGNOSIS — I129 Hypertensive chronic kidney disease with stage 1 through stage 4 chronic kidney disease, or unspecified chronic kidney disease: Secondary | ICD-10-CM | POA: Diagnosis not present

## 2021-08-20 DIAGNOSIS — E785 Hyperlipidemia, unspecified: Secondary | ICD-10-CM | POA: Insufficient documentation

## 2021-08-20 DIAGNOSIS — Z7901 Long term (current) use of anticoagulants: Secondary | ICD-10-CM | POA: Diagnosis not present

## 2021-08-20 DIAGNOSIS — I4891 Unspecified atrial fibrillation: Secondary | ICD-10-CM

## 2021-08-20 DIAGNOSIS — N1832 Chronic kidney disease, stage 3b: Secondary | ICD-10-CM | POA: Diagnosis not present

## 2021-08-20 DIAGNOSIS — Z8616 Personal history of COVID-19: Secondary | ICD-10-CM | POA: Insufficient documentation

## 2021-08-20 HISTORY — PX: CARDIOVERSION: SHX1299

## 2021-08-20 LAB — BASIC METABOLIC PANEL
BUN/Creatinine Ratio: 17 (ref 10–24)
BUN: 28 mg/dL — ABNORMAL HIGH (ref 8–27)
CO2: 23 mmol/L (ref 20–29)
Calcium: 9.4 mg/dL (ref 8.6–10.2)
Chloride: 103 mmol/L (ref 96–106)
Creatinine, Ser: 1.61 mg/dL — ABNORMAL HIGH (ref 0.76–1.27)
Glucose: 109 mg/dL — ABNORMAL HIGH (ref 70–99)
Potassium: 4.2 mmol/L (ref 3.5–5.2)
Sodium: 140 mmol/L (ref 134–144)
eGFR: 43 mL/min/{1.73_m2} — ABNORMAL LOW (ref >59)

## 2021-08-20 SURGERY — CARDIOVERSION
Anesthesia: General

## 2021-08-20 MED ORDER — SODIUM CHLORIDE 0.9 % IV SOLN: INTRAVENOUS | Status: DC

## 2021-08-20 MED ORDER — PROPOFOL 10 MG/ML IV BOLUS
INTRAVENOUS | Status: DC | PRN
  Administered 2021-08-20: 60 mg via INTRAVENOUS

## 2021-08-20 MED ORDER — LIDOCAINE 2% (20 MG/ML) 5 ML SYRINGE
INTRAMUSCULAR | Status: DC | PRN
  Administered 2021-08-20: 60 mg via INTRAVENOUS

## 2021-08-20 NOTE — Anesthesia Postprocedure Evaluation (Signed)
Anesthesia Post Note ? ?Patient: Gregory [name] ? ?Procedure(s) Performed: CARDIOVERSION ? ?  ? ?Patient location during evaluation: Endoscopy ?Anesthesia Type: General ?Level of consciousness: awake ?Pain management: pain level controlled ?Vital Signs Assessment: post-procedure vital signs reviewed and stable ?Respiratory status: spontaneous breathing, nonlabored ventilation, respiratory function stable and patient connected to nasal cannula oxygen ?Cardiovascular status: blood pressure returned to baseline and stable ?Postop Assessment: no apparent nausea or vomiting ?Anesthetic complications: no ? ? ?No notable events documented. ? ?Last Vitals:  ?Vitals:  ? 08/20/21 0850 08/20/21 0906  ?BP: 102/67 112/67  ?Pulse: 67 73  ?Resp: 20 (!) 22  ?SpO2: 99% 100%  ?  ?Last Pain:  ?Vitals:  ? 08/20/21 0837  ?TempSrc:   ?PainSc: 0-No pain  ? ? ?  ?  ?  ?  ?  ?  ? ?Oumou Smead P Vannie Hochstetler ? ? ? ? ?

## 2021-08-20 NOTE — Anesthesia Postprocedure Evaluation (Signed)
Anesthesia Post Note ? ?Patient: Harlyn E Grall ? ?Procedure(s) Performed: CARDIOVERSION ? ?  ? ?Patient location during evaluation: Endoscopy ?Anesthesia Type: General ?Level of consciousness: awake ?Pain management: pain level controlled ?Vital Signs Assessment: post-procedure vital signs reviewed and stable ?Respiratory status: spontaneous breathing, nonlabored ventilation, respiratory function stable and patient connected to nasal cannula oxygen ?Cardiovascular status: blood pressure returned to baseline and stable ?Postop Assessment: no apparent nausea or vomiting ?Anesthetic complications: no ? ? ?No notable events documented. ? ?Last Vitals:  ?Vitals:  ? 08/20/21 0850 08/20/21 0906  ?BP: 102/67 112/67  ?Pulse: 67 73  ?Resp: 20 (!) 22  ?SpO2: 99% 100%  ?  ?Last Pain:  ?Vitals:  ? 08/20/21 0837  ?TempSrc:   ?PainSc: 0-No pain  ? ? ?  ?  ?  ?  ?  ?  ? ?Kamarii Carton P Betzaida Cremeens ? ? ? ? ?

## 2021-08-20 NOTE — Discharge Instructions (Signed)

## 2021-08-20 NOTE — Transfer of Care (Signed)
Immediate Anesthesia Transfer of Care Note ? ?Patient: Gregory [name] ? ?Procedure(s) Performed: CARDIOVERSION ? ?Patient Location: Endoscopy Unit ? ?Anesthesia Type:General ? ?Level of Consciousness: drowsy ? ?Airway & Oxygen Therapy: Patient Spontanous Breathing ? ?Post-op Assessment: Report given to RN and Post -op Vital signs reviewed and stable ? ?Post vital signs: Reviewed and stable ? ?Last Vitals:  ?Vitals Value Taken Time  ?BP    ?Temp    ?Pulse    ?Resp    ?SpO2    ? ? ?Last Pain:  ?Vitals:  ? 08/20/21 0741  ?TempSrc: Temporal  ?PainSc: 0-No pain  ?   ? ?  ? ?Complications: No notable events documented. ?

## 2021-08-20 NOTE — Anesthesia Preprocedure Evaluation (Addendum)
Anesthesia Evaluation  ?Patient identified by MRN, date of birth, ID band ?Patient awake ? ? ? ?Reviewed: ?Allergy & Precautions, NPO status , Patient's Chart, lab work & pertinent test results ? ?Airway ?Mallampati: I ? ?TM Distance: >3 FB ?Neck ROM: Full ? ? ? Dental ?no notable dental hx. ? ?  ?Pulmonary ?neg pulmonary ROS,  ?  ?Pulmonary exam normal ?breath sounds clear to auscultation ? ? ? ? ? ? Cardiovascular ?hypertension, Pt. on medications ?+ dysrhythmias Atrial Fibrillation  ?Rhythm:Irregular Rate:Tachycardia ? ? ?  ?Neuro/Psych ? Neuromuscular disease negative psych ROS  ? GI/Hepatic ?negative GI ROS, (+)  ?  ? substance abuse ? ,   ?Endo/Other  ?negative endocrine ROS ? Renal/GU ?Renal disease  ? ?  ?Musculoskeletal ? ?(+) Arthritis ,  ? Abdominal ?  ?Peds ? Hematology ?negative hematology ROS ?(+)   ?Anesthesia Other Findings ?A-fib with RVR ? Reproductive/Obstetrics ? ?  ? ? ? ? ? ? ? ? ? ? ? ? ? ?  ?  ? ? ? ? ? ? ? ?Anesthesia Physical ?Anesthesia Plan ? ?ASA: 4 ? ?Anesthesia Plan: General  ? ?Post-op Pain Management:   ? ?Induction: Intravenous ? ?PONV Risk Score and Plan: 2 and Propofol infusion and Treatment may vary due to age or medical condition ? ?Airway Management Planned: Mask ? ?Additional Equipment:  ? ?Intra-op Plan:  ? ?Post-operative Plan:  ? ?Informed Consent: I have reviewed the patients History and Physical, chart, labs and discussed the procedure including the risks, benefits and alternatives for the proposed anesthesia with the patient or authorized representative who has indicated his/her understanding and acceptance.  ? ? ? ?Dental advisory given ? ?Plan Discussed with: CRNA ? ?Anesthesia Plan Comments:   ? ? ? ? ? ?Anesthesia Quick Evaluation ? ?

## 2021-08-20 NOTE — Interval H&P Note (Signed)
History and Physical Interval Note: ? ?08/20/2021 ?7:48 AM ? ?Gregory [name]  has presented today for surgery, with the diagnosis of afib.  The various methods of treatment have been discussed with the patient and family. After consideration of risks, benefits and other options for treatment, the patient has consented to  Procedure(s): ?CARDIOVERSION (N/A) as a surgical intervention.  The patient's history has been reviewed, patient examined, no change in status, stable for surgery.  I have reviewed the patient's chart and labs.  Questions were answered to the patient's satisfaction.   ? ? ?[name] ? ? ?

## 2021-08-20 NOTE — Anesthesia Procedure Notes (Signed)
Procedure Name: [place] ?Date/Time: 08/20/2021 8:37 AM ?Performed by: Imagene Riches, CRNA ?Pre-anesthesia Checklist: Patient identified, Emergency Drugs available, Suction available, Patient being monitored and Timeout performed ?Patient Re-evaluated:Patient Re-evaluated prior to induction ?Oxygen Delivery Method: Ambu bag ? ? ? ? ?

## 2021-08-20 NOTE — CV Procedure (Signed)
Direct current cardioversion 08/20/2021 8:38 AM ? ?Indication symptomatic A. Fibrillation. ? ?Procedure: Using 60 mg of IV Propofol and 60 IV Lidocaine (for reducing venous pain) for achieving deep sedation, synchronized direct current cardioversion performed. Patient was delivered with 120 Joules of electricity X 1 with success to NSR. Patient tolerated the procedure well. No immediate complication noted.  ? ?Allergies as of 08/20/2021   ?No Known Allergies ?  ? ?  ?Medication List  ?  ? ?TAKE these medications   ? ?acetaminophen 500 MG tablet ?Commonly known as: TYLENOL ?Take 1,000 mg by mouth every 6 (six) hours as needed for moderate pain or headache. ?  ?diltiazem 120 MG 24 hr capsule ?Commonly known as: TIAZAC ?Take 1 capsule (120 mg total) by mouth daily. ?  ?dofetilide 250 MCG capsule ?Commonly known as: TIKOSYN ?Take 1 capsule (250 mcg total) by mouth 2 (two) times daily. ?  ?Eliquis 5 MG Tabs tablet ?Generic drug: apixaban ?TAKE 1 TABLET(5 MG) BY MOUTH TWICE DAILY ?  ?lisinopril 5 MG tablet ?Commonly known as: ZESTRIL ?TAKE 1 TABLET(5 MG) BY MOUTH DAILY ?  ?Magnesium Oxide 250 MG Tabs ?Take 1 tablet (250 mg total) by mouth daily. ?  ?psyllium 58.6 % packet ?Commonly known as: METAMUCIL ?Take 1 packet by mouth daily as needed (constipation). ?  ?rosuvastatin 5 MG tablet ?Commonly known as: CRESTOR ?TAKE 1 TABLET(5 MG) BY MOUTH DAILY ?What changed: See the new instructions. ?  ? ?  ? ? ? ? ?[name], MD, [facility] ?08/20/2021, 8:38 AM ?Office: [phone] ?Fax: [phone] ?Pager: [phone]  ? ?

## 2021-08-20 NOTE — H&P (Signed)
Primary Physician/Referring:  Christain Sacramento, MD  Patient ID: Gregory [name], male    DOB: [birth year], 81 y.o.   MRN: [id number]  Dyspnea, fatigue Recurrence of atrial fibrillation  HPI:    Gregory [name]  is a 81 y.o. Caucasian male with  history of stage 3b CKD, hypertension, mild hyperlipidemia and moderate CAD by coronary CTA, paroxysmal atrial fibrillation. He has history of multiple cardioversions and eventually dofetilide was discontinued by Dr. [name] due to efficacy, and was placed on  Multaq and has mostly maintained sinus since Dec 2018. Due to recurrence of atrial fibrillation, he underwent 2nd ablation on 02/22/2019, 1st ablation being on 11/28/2016.  He had repeat cardioversion on 05/24/2019 and after Tikosyn initiation had cardioversion on 12/01/2019. He went back into atrial fibrillation about 2 weeks ago and now scheduled for cardioversion due to symptomatic AF with dyspnea and fatigue.   Had caught Covid 19 infection Feb 23rd, negative PCR testing as of yesterday 08/19/21. Otherwise he is presently doing well. No bleeding diathesis on Eliquis. No dizziness or syncope. Continues to remain active.  Past Medical History:  Diagnosis Date   Arthritis    "right knee" (01/28/2016)   Diverticulosis of colon (without mention of hemorrhage)    Dysrhythmia    Hyperlipidemia    Internal hemorrhoids without mention of complication    Personal history of colonic polyps 01/17/2002   adenomatous, tublar adenoma 02/10/2007   PNA (pneumonia) 03/31/2012   Past Surgical History:  Procedure Laterality Date   ATRIAL FIBRILLATION ABLATION  11/28/2016   ATRIAL FIBRILLATION ABLATION N/A 11/28/2016   Procedure: Atrial Fibrillation Ablation;  Surgeon: Constance Haw, MD;  Location: [place] [facility];  Service: Cardiovascular;  Laterality: N/A;   ATRIAL FIBRILLATION ABLATION N/A 03/18/2019   Procedure: ATRIAL FIBRILLATION ABLATION;  Surgeon: Constance Haw, MD;  Location: [place] [facility];  Service: Cardiovascular;  Laterality: N/A;   CARDIOVERSION  05/03/2012   Procedure: CARDIOVERSION;  Surgeon: Laverda Page, MD;  Location: [place];  Service: Cardiovascular;  Laterality: N/A;   CARDIOVERSION  06/08/2012   Procedure: CARDIOVERSION;  Surgeon: Laverda Page, MD;  Location: [place];  Service: Cardiovascular;  Laterality: N/A;  [name]   CARDIOVERSION N/A 10/28/2013   Procedure: CARDIOVERSION;  Surgeon: Laverda Page, MD;  Location: [place];  Service: Cardiovascular;  Laterality: N/A;   CARDIOVERSION N/A 06/30/2014   Procedure: CARDIOVERSION;  Surgeon: Laverda Page, MD;  Location: [facility];  Service: Cardiovascular;  Laterality: N/A;  H&P in file   CARDIOVERSION N/A 09/05/2014   Procedure: CARDIOVERSION;  Surgeon: Adrian Prows, MD;  Location: [place];  Service: Cardiovascular;  Laterality: N/A;   CARDIOVERSION N/A 12/24/2015   Procedure: CARDIOVERSION;  Surgeon: Adrian Prows, MD;  Location: [place];  Service: Cardiovascular;  Laterality: N/A;   CARDIOVERSION N/A 01/30/2016   Procedure: CARDIOVERSION;  Surgeon: Adrian Prows, MD;  Location: [place];  Service: Cardiovascular;  Laterality: N/A;   CARDIOVERSION N/A 02/08/2016   Procedure: CARDIOVERSION;  Surgeon: Adrian Prows, MD;  Location: [facility];  Service: Cardiovascular;  Laterality: N/A;   CARDIOVERSION N/A 02/08/2019   Procedure: CARDIOVERSION;  Surgeon: Adrian Prows, MD;  Location: [place];  Service: Cardiovascular;  Laterality: N/A;   CARDIOVERSION N/A 05/24/2019   Procedure: CARDIOVERSION;  Surgeon: Elouise Munroe, MD;  Location: [facility] ENDOSCOPY;  Service: Cardiovascular;  Laterality: N/A;   CARDIOVERSION N/A 09/21/2019   Procedure: CARDIOVERSION;  Surgeon: Adrian Prows, MD;  Location: [place];  Service: Cardiovascular;  Laterality: N/A;   CARDIOVERSION N/A 12/01/2019   Procedure: CARDIOVERSION;  Surgeon: Adrian Prows, MD;  Location: [facility] ENDOSCOPY;  Service:  Cardiovascular;  Laterality: N/A;   CATARACT EXTRACTION W/ INTRAOCULAR LENS  IMPLANT, BILATERAL Bilateral    COLONOSCOPY     INGUINAL HERNIA REPAIR Bilateral    as child   KNEE ARTHROSCOPY Right 2004   Family History  Problem Relation Age of Onset   Cancer Mother        bladder   Bladder Cancer Mother    Heart disease Father    Stroke Father    Colon cancer Neg Hx    Stomach cancer Neg Hx    Esophageal cancer Neg Hx    Liver cancer Neg Hx    Pancreatic cancer Neg Hx     Social History   Tobacco Use   Smoking status: Never   Smokeless tobacco: Never  Substance Use Topics   Alcohol use: Yes    Alcohol/week: 1.0 standard drink    Types: 1 Glasses of wine per week    Comment: daily   Marital Status: Married  ROS  Review of Systems  Cardiovascular:  Positive for dyspnea on exertion and palpitations. Negative for chest pain and leg swelling.  Objective  There were no vitals taken for this visit.  Vitals with BMI 07/22/2021 07/08/2021 03/13/2021  Height 5' 10.5" 5' 10"  5' 10"   Weight 191 lbs 6 oz 191 lbs 6 oz 187 lbs 10 oz  BMI 27.07 16.10 96.04  Systolic 540 981 191  Diastolic 84 64 77  Pulse 57 64 57     Physical Exam Constitutional:      General: He is not in acute distress.    Appearance: He is well-developed.  Neck:     Vascular: No carotid bruit or JVD.  Cardiovascular:     Rate and Rhythm: Tachycardia present. Rhythm irregular.     Pulses: Intact distal pulses.     Heart sounds: No murmur heard.   No gallop.  Pulmonary:     Effort: Pulmonary effort is normal. No accessory muscle usage or respiratory distress.     Breath sounds: Normal breath sounds.  Abdominal:     General: Bowel sounds are normal.     Palpations: Abdomen is soft.  Musculoskeletal:     Right lower leg: No edema.     Left lower leg: No edema.   Laboratory examination:   Recent Labs    01/04/21 1010 08/19/21 1026  NA 144 140  K 4.7 4.2  CL 104 103  CO2 27 23  GLUCOSE 113* 109*   BUN 22 28*  CREATININE 1.44* 1.61*  CALCIUM 9.4 9.4    CrCl cannot be calculated (Unknown ideal weight.).  CMP Latest Ref Rng & Units 08/19/2021 01/04/2021 07/10/2020  Glucose 70 - 99 mg/dL 109(H) 113(H) 96  BUN 8 - 27 mg/dL 28(H) 22 20  Creatinine 0.76 - 1.27 mg/dL 1.61(H) 1.44(H) 1.46(H)  Sodium 134 - 144 mmol/L 140 144 143  Potassium 3.5 - 5.2 mmol/L 4.2 4.7 4.3  Chloride 96 - 106 mmol/L 103 104 107(H)  CO2 20 - 29 mmol/L 23 27 24   Calcium 8.6 - 10.2 mg/dL 9.4 9.4 9.3  Total Protein 6.0 - 8.5 g/dL - 6.4 6.3  Total Bilirubin 0.0 - 1.2 mg/dL - 0.5 0.5  Alkaline Phos 44 - 121 IU/L - 55 55  AST 0 - 40 IU/L - 15 14  ALT 0 - 44 IU/L -  13 16   CBC Latest Ref Rng & Units 01/04/2021 07/10/2020 11/28/2019  WBC 3.4 - 10.8 x10E3/uL 5.0 4.6 4.7  Hemoglobin 13.0 - 17.7 g/dL 14.4 14.8 13.7  Hematocrit 37.5 - 51.0 % 43.0 44.5 41.6  Platelets 150 - 450 x10E3/uL 186 183 167   Lipid Panel     Component Value Date/Time   CHOL 134 07/10/2020 0817   TRIG 56 07/10/2020 0817   HDL 50 07/10/2020 0817   LDLCALC 72 07/10/2020 0817   HEMOGLOBIN A1C No results found for: HGBA1C, MPG TSH No results for input(s): TSH in the last 8760 hours.  External labs:   07/04/2019: PSA 4.900   06/18/2018: TSH 1.69.  Normal H and H, MCV 98, MCH 33, CBC otherwise normal.  Creatinine 1.82, EGFR 35/41, potassium 5.2, CMP otherwise normal.  09/28/2017: Creatinine 1.84, EGFR 35/41, potassium 5.6, CMP otherwise normal.  Normal H&H, MCV 99, CBC otherwise normal.  05/23/2015: Total cholesterol 208, triglycerides 113, HDL 53, LDL 132, creatinine 1.53, eGFR 44, potassium 4.6, CMP otherwise normal, CBC normal, PSA elevated at 5.3  Medications and allergies  No Known Allergies   No current facility-administered medications on file prior to encounter.   Current Outpatient Medications on File Prior to Encounter  Medication Sig Dispense Refill   acetaminophen (TYLENOL) 500 MG tablet Take 1,000 mg by mouth every 6 (six) hours  as needed for moderate pain or headache.     diltiazem (TIAZAC) 120 MG 24 hr capsule Take 1 capsule (120 mg total) by mouth daily. 90 capsule 3   dofetilide (TIKOSYN) 250 MCG capsule Take 1 capsule (250 mcg total) by mouth 2 (two) times daily. 180 capsule 3   ELIQUIS 5 MG TABS tablet TAKE 1 TABLET(5 MG) BY MOUTH TWICE DAILY 180 tablet 3   lisinopril (ZESTRIL) 5 MG tablet TAKE 1 TABLET(5 MG) BY MOUTH DAILY 90 tablet 1   Magnesium Oxide 250 MG TABS Take 1 tablet (250 mg total) by mouth daily. 90 tablet 3   psyllium (METAMUCIL) 58.6 % packet Take 1 packet by mouth daily as needed (constipation).      rosuvastatin (CRESTOR) 5 MG tablet TAKE 1 TABLET(5 MG) BY MOUTH DAILY (Patient taking differently: Take 5 mg by mouth every evening.) 90 tablet 3    Radiology:   No results found.  Cardiac Studies:   Treadmill stress test  [10/09/2014]: Indication: Atrial Fibrillation Resting EKG SR with 1st degree AV Block, LAD, LAFB, RBBB. Trifascicular block. Normal BP response. There was no ST-T changes of ischemia with exercise stress test. No stress induced arrhythmias. Stress terminated due to THR (>85% MPHR)/MPHR met. The patient exercised according to Bruce Protocol, Total time recorded 06:50 min achieving max heart rate of 158 which was 106% of MPHR for age and 8.34 METS of work.  Coronary CTA 03/06/2019: 1. Coronary artery calcium score 1837 Agatston units. This places the patient in the 86th percentile for age and gender, suggesting high risk for future cardiac events. 2. Extensive calcified plaque throughout the coronary system. Mild stenosis for the most part, possibly around 50% stenosis in the distal RCA and the proximal LCx (small vessel). However, difficult to quantify given extensive calcification with possibility of blooming artifact. Will send for FFR. 3.  Pulmonary veins without stenosis.  4.  No LA appendage thrombus seen.  FFR 0.9 mid LAD, FFR 0.93 distal RCA, FFR 0.94 mid LCx: No evidence for  hemodynamically significant stenosis.  Echocardiogram 09/29/2019:  Left ventricle cavity is normal in size. Mild  concentric hypertrophy of the left ventricle. Normal global wall motion.  Normal LV systolic function  with EF 61%. Indeterminate diastolic filling pattern.  Left atrial cavity is severely dilated.  Moderate (Grade II) mitral regurgitation.  Moderate tricuspid regurgitation. Estimated pulmonary artery systolic pressure is 34 mmHg.  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  EKG:  Tele: 08/20/21 A. Fib with RVR, non specific T change  EKG 07/08/2021: Sinus rhythm with first-degree AV block at rate of 60 bpm, normal QT interval.  No evidence of ischemia.  No significant change from 03/13/2021.    Assessment   1.  Paroxysmal atrial fibrillation 2.  Primary hypertension 3.  Chronic stage IIIb kidney disease   Recommendations:   Gregory [name]  is a 81 y.o. Caucasian male with  history of stage 3b CKD, hypertension, mild hyperlipidemia and moderate CAD by coronary CTA, paroxysmal atrial fibrillation. He has history of multiple cardioversions and eventually dofetilide was discontinued by Dr. [name] due to efficacy, and was placed on  Multaq and has mostly maintained sinus since Dec 2018. Due to recurrence of atrial fibrillation, he underwent 2nd ablation on 02/22/2019, 1st ablation being on 11/28/2016.  He had repeat cardioversion on 05/24/2019 and after Tikosyn initiation had cardioversion on 12/01/2019.  He called our office stating he is back into atrial fibrillation, symptoms include mild dyspnea and palpitations.  He is now scheduled for direct-current cardioversion.  All questions, risks and benefits discussed with the patient and he is willing to proceed.     Adrian Prows, MD, [facility] 08/20/2021, 6:35 AM Office: [phone] Pager: [phone]

## 2021-08-21 ENCOUNTER — Encounter: Payer: Self-pay | Admitting: Cardiology

## 2021-08-21 ENCOUNTER — Encounter (HOSPITAL_COMMUNITY): Payer: Self-pay | Admitting: Cardiology

## 2021-08-21 DIAGNOSIS — I480 Paroxysmal atrial fibrillation: Secondary | ICD-10-CM

## 2021-08-21 MED ORDER — AMIODARONE HCL 200 MG PO TABS: 200.0000 mg | ORAL_TABLET | Freq: Every day | ORAL | 3 refills | Status: DC

## 2021-08-21 NOTE — Telephone Encounter (Signed)
From patient.

## 2021-08-21 NOTE — Telephone Encounter (Signed)
ICD-10-CM   ?1. Paroxysmal atrial fibrillation (HCC)  I48.0 amiodarone (PACERONE) 200 MG tablet  ?  ? ?Meds ordered this encounter  ?Medications  ? amiodarone (PACERONE) 200 MG tablet  ?  Sig: Take 1 tablet (200 mg total) by mouth daily. Take twice daily for 10 days then one tab daily.  ?  Dispense:  90 tablet  ?  Refill:  3  ?  Let patient know to start taking Amio 5 days after he stops Multaq. Discontinue multaq in your system - ineffective  ?  ?Medications Discontinued During This Encounter  ?Medication Reason  ? dofetilide (TIKOSYN) 250 MCG capsule Ineffective  ?  ?

## 2021-08-22 NOTE — Telephone Encounter (Signed)
From pt

## 2021-08-23 ENCOUNTER — Encounter (HOSPITAL_BASED_OUTPATIENT_CLINIC_OR_DEPARTMENT_OTHER): Payer: Self-pay | Admitting: Physical Therapy

## 2021-08-26 HISTORY — PX: PARTIAL KNEE ARTHROPLASTY: SHX2174

## 2021-08-27 ENCOUNTER — Encounter (HOSPITAL_BASED_OUTPATIENT_CLINIC_OR_DEPARTMENT_OTHER): Payer: Self-pay | Admitting: Physical Therapy

## 2021-08-30 ENCOUNTER — Encounter (HOSPITAL_BASED_OUTPATIENT_CLINIC_OR_DEPARTMENT_OTHER): Payer: Self-pay | Admitting: Physical Therapy

## 2021-09-02 ENCOUNTER — Telehealth: Payer: Self-pay

## 2021-09-02 NOTE — Telephone Encounter (Signed)
Patient sent message on my chart and never got a response back. He wanted me to sned it to you again. Please advise. ? ?"Bummed.  Cardioversion lasted about 12 hours. Currently irregular, 50-60 bpm, and 96-97% O2 in the fingertip device.  ????" ?

## 2021-09-02 NOTE — Telephone Encounter (Signed)
From patient.

## 2021-09-03 ENCOUNTER — Encounter (HOSPITAL_BASED_OUTPATIENT_CLINIC_OR_DEPARTMENT_OTHER): Payer: Self-pay | Admitting: Physical Therapy

## 2021-09-06 ENCOUNTER — Encounter (HOSPITAL_BASED_OUTPATIENT_CLINIC_OR_DEPARTMENT_OTHER): Payer: Self-pay | Admitting: Physical Therapy

## 2021-09-06 NOTE — Telephone Encounter (Signed)
From patient.

## 2021-09-10 ENCOUNTER — Encounter (HOSPITAL_BASED_OUTPATIENT_CLINIC_OR_DEPARTMENT_OTHER): Payer: Self-pay | Admitting: Physical Therapy

## 2021-09-13 ENCOUNTER — Encounter (HOSPITAL_BASED_OUTPATIENT_CLINIC_OR_DEPARTMENT_OTHER): Payer: Self-pay | Admitting: Physical Therapy

## 2021-09-17 ENCOUNTER — Encounter (HOSPITAL_BASED_OUTPATIENT_CLINIC_OR_DEPARTMENT_OTHER): Payer: Self-pay | Admitting: Physical Therapy

## 2021-09-17 ENCOUNTER — Ambulatory Visit: Payer: Medicare Other | Admitting: Physical Therapy

## 2021-09-17 NOTE — Telephone Encounter (Signed)
From patient.

## 2021-09-19 ENCOUNTER — Ambulatory Visit (INDEPENDENT_AMBULATORY_CARE_PROVIDER_SITE_OTHER): Payer: Medicare Other | Admitting: Physical Therapy

## 2021-09-19 DIAGNOSIS — M6281 Muscle weakness (generalized): Secondary | ICD-10-CM

## 2021-09-19 DIAGNOSIS — M25661 Stiffness of right knee, not elsewhere classified: Secondary | ICD-10-CM

## 2021-09-19 DIAGNOSIS — M25561 Pain in right knee: Secondary | ICD-10-CM

## 2021-09-19 NOTE — Therapy (Signed)
?OUTPATIENT PHYSICAL THERAPY LOWER EXTREMITY EVALUATION ? ? ?Patient Name: Gregory [name] ?MRN: [id number] ?DOB:Sep 05, 1940, 81 y.o., male ?Today's Date: 09/19/2021 ? ? PT End of Session - 09/20/21 [id number]   ? ? Visit Number 1   ? Number of Visits 16   ? Date for PT Re-Evaluation 11/14/21   ? Authorization Type Medicare   ? PT Start Time 1303   ? PT Stop Time [id number]   ? PT Time Calculation (min) 42 min   ? Activity Tolerance Patient tolerated treatment well   ? Behavior During Therapy [facility] for tasks assessed/performed   ? ?  ?  ? ?  ? ? ?Past Medical History:  ?Diagnosis Date  ? Arthritis   ? "right knee" (01/28/2016)  ? Diverticulosis of colon (without mention of hemorrhage)   ? Dysrhythmia   ? Hyperlipidemia   ? Internal hemorrhoids without mention of complication   ? Personal history of colonic polyps 01/17/2002  ? adenomatous, tublar adenoma 02/10/2007  ? PNA (pneumonia) 03/31/2012  ? ?Past Surgical History:  ?Procedure Laterality Date  ? ATRIAL FIBRILLATION ABLATION  11/28/2016  ? ATRIAL FIBRILLATION ABLATION N/A 11/28/2016  ? Procedure: Atrial Fibrillation Ablation;  Surgeon: Constance Haw, MD;  Location: [place] [facility];  Service: Cardiovascular;  Laterality: N/A;  ? ATRIAL FIBRILLATION ABLATION N/A 03/18/2019  ? Procedure: ATRIAL FIBRILLATION ABLATION;  Surgeon: Constance Haw, MD;  Location: [place] [facility];  Service: Cardiovascular;  Laterality: N/A;  ? CARDIOVERSION  05/03/2012  ? Procedure: CARDIOVERSION;  Surgeon: Laverda Page, MD;  Location: [place];  Service: Cardiovascular;  Laterality: N/A;  ? CARDIOVERSION  06/08/2012  ? Procedure: CARDIOVERSION;  Surgeon: Laverda Page, MD;  Location: [place];  Service: Cardiovascular;  Laterality: N/A;  [name]  ? CARDIOVERSION N/A 10/28/2013  ? Procedure: CARDIOVERSION;  Surgeon: Laverda Page, MD;  Location: [place];  Service: Cardiovascular;  Laterality: N/A;  ? CARDIOVERSION N/A 06/30/2014  ? Procedure: CARDIOVERSION;   Surgeon: Laverda Page, MD;  Location: [facility] ENDOSCOPY;  Service: Cardiovascular;  Laterality: N/A;  H&P in file  ? CARDIOVERSION N/A 09/05/2014  ? Procedure: CARDIOVERSION;  Surgeon: Adrian Prows, MD;  Location: [place];  Service: Cardiovascular;  Laterality: N/A;  ? CARDIOVERSION N/A 12/24/2015  ? Procedure: CARDIOVERSION;  Surgeon: Adrian Prows, MD;  Location: [place];  Service: Cardiovascular;  Laterality: N/A;  ? CARDIOVERSION N/A 01/30/2016  ? Procedure: CARDIOVERSION;  Surgeon: Adrian Prows, MD;  Location: [place];  Service: Cardiovascular;  Laterality: N/A;  ? CARDIOVERSION N/A 02/08/2016  ? Procedure: CARDIOVERSION;  Surgeon: Adrian Prows, MD;  Location: [place];  Service: Cardiovascular;  Laterality: N/A;  ? CARDIOVERSION N/A 02/08/2019  ? Procedure: CARDIOVERSION;  Surgeon: Adrian Prows, MD;  Location: [place];  Service: Cardiovascular;  Laterality: N/A;  ? CARDIOVERSION N/A 05/24/2019  ? Procedure: CARDIOVERSION;  Surgeon: Elouise Munroe, MD;  Location: [place];  Service: Cardiovascular;  Laterality: N/A;  ? CARDIOVERSION N/A 09/21/2019  ? Procedure: CARDIOVERSION;  Surgeon: Adrian Prows, MD;  Location: [place];  Service: Cardiovascular;  Laterality: N/A;  ? CARDIOVERSION N/A 12/01/2019  ? Procedure: CARDIOVERSION;  Surgeon: Adrian Prows, MD;  Location: [place];  Service: Cardiovascular;  Laterality: N/A;  ? CARDIOVERSION N/A 08/20/2021  ? Procedure: CARDIOVERSION;  Surgeon: Adrian Prows, MD;  Location: [place];  Service: Cardiovascular;  Laterality: N/A;  ? CATARACT EXTRACTION W/ INTRAOCULAR LENS  IMPLANT, BILATERAL Bilateral   ? COLONOSCOPY    ? INGUINAL HERNIA REPAIR Bilateral   ?  as child  ? KNEE ARTHROSCOPY Right 2004  ? ?Patient Active Problem List  ? Diagnosis Date Noted  ? 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 BPH with elevated PSA 12/13/2019  ? Dermatitis 12/13/2019  ? Encounter for Medicare annual wellness exam 12/13/2019  ? Chronic pain of  right knee 10/10/2019  ? Atypical atrial flutter ([place]) 05/17/2019  ? Acquired thrombophilia ([place]) 05/17/2019  ? 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/06/2018  ? Primary osteoarthritis of right knee 08/06/2018  ? Pes anserinus bursitis of left knee 10/31/2016  ? Allergic rhinitis 02/14/2016  ? Hearing loss 02/13/2016  ? CKD (chronic kidney disease) stage 3, GFR 30-59 ml/min (HCC) 02/13/2016  ? Atrial fibrillation ([place]) 02/07/2016  ? Paroxysmal atrial fibrillation ([place]) 03/31/2012  ? Hypertension 03/31/2012  ? Hemorrhoids, external 05/15/2011  ? ? ?PCP: Christain Sacramento, MD ? ?REFERRING PROVIDER: Christain Sacramento, MD ? ?REFERRING DIAG: S/P R TKE (uni)  ? ?THERAPY DIAG:  ?Acute pain of right knee ? ?Muscle weakness (generalized) ? ?Stiffness of right knee, not elsewhere classified ? ?ONSET DATE:  Surgery date: 09/11/21  ? ?SUBJECTIVE:  ? ?SUBJECTIVE STATEMENT: ?Pt had R Uni TKA 09/11/21. Using 4 [employer]. 3 steps to enter, 1 hand rail. Bedroom on first floor. Pt doing very well at this time, minimal pain. Has not had any home health yet.   ? ?PERTINENT HISTORY: ?Chronic A-Fib,  ? ?PAIN:  ?Are you having pain? Yes: NPRS scale: 1-3 /10 ?Pain location: R knee ?Pain description: achey ?Aggravating factors: increased activity  ?Relieving factors: Rest, ice  ? ? ?PRECAUTIONS: Knee ? ?WEIGHT BEARING RESTRICTIONS No ? ?FALLS:  ?Has patient fallen in last 6 months? No ? ? ?PLOF: Independent ? ?PATIENT GOALS  return to regular use of knee/leg, PLOF  ? ?OBJECTIVE:  ? ? ?COGNITION: ? Overall cognitive status: Within functional limits for tasks assessed   ?  ? ?PALPATION: ?R knee: healing incision, dressing in place. Mild swelling, wearing compression sock.  ? ? ?LE ROM:  Hip ROM: Mild limitation ? ?Passive ROM Right ?09/19/2021 Left ?09/19/2021  ?    ?    ?    ?    ?    ?    ?Knee flexion 93   ?Knee extension -3   ?    ?    ?    ?    ? (Blank rows = not tested) ? ? ?LE MMT: ? Not tested  ? ?LOWER EXTREMITY SPECIAL TESTS:  ?Not tested  ? ?GAIT: ?Distance  walked: 100 ft  ?Assistive device utilized: Environmental consultant - 4 wheeled ?Level of assistance: Modified independence ?Comments: cuing for increased knee flexion with swing.  ? ? ?TODAY'S TREATMENT: ?Ther ex: see below for HEP ?Manual: PROM for knee flex and ext.  ? ? ?PATIENT EDUCATION:  ?Education details: PT POC, Exam findings, HEP, Home safety, use of CPM and icing.  ?Person educated: Patient and Spouse ?Education method: Explanation, Demonstration, Tactile cues, Verbal cues, and Handouts ?Education comprehension: verbalized understanding, returned demonstration, verbal cues required, tactile cues required, and needs further education ? ? ?HOME EXERCISE PROGRAM: ?Access Code: [email or web address] ?URL: [email or web address] ?Date: 09/20/2021 ?Prepared by: Lyndee Hensen ? ?Exercises ?- Supine Heel Slide  - 3 x daily - 1-2 sets - 10 reps ?- Supine Heel Slide with Strap  - 2 x daily - 1-2 sets - 10 reps ?- Supine Quadricep Sets  - 3 x daily - 2 sets - 10 reps ?- Straight Leg  Raise  - 2 x daily - 1-2 sets - 10 reps ? ?ASSESSMENT: ? ?CLINICAL IMPRESSION: ?Pt presents with deficits following R knee arthroplasty, unicompartmental on 09/11/21. Pt now 1 week post op. He has stiffness in R knee joint, with limited PROM and AROM. He has weakness in knee as well. Pt with good ability for quad contraction today. He is using RW for ambulation, and has stiffness and decreased flexion with swing. Pt with decreased ability for full functional activities at this time due to post op deficits. Pt to benefit from skilled PT to improve deficits and pain, and to return to PLOF. ? ? ?OBJECTIVE IMPAIRMENTS Abnormal gait, decreased activity tolerance, decreased balance, decreased endurance, decreased knowledge of use of DME, decreased mobility, difficulty walking, decreased ROM, decreased strength, hypomobility, increased muscle spasms, impaired flexibility, and pain.  ? ?ACTIVITY LIMITATIONS cleaning, community activity, driving, meal prep, yard  work, and shopping.  ? ?PERSONAL FACTORS  none  are also affecting patient's functional outcome.  ? ? ?REHAB POTENTIAL: Good ? ?CLINICAL DECISION MAKING: Stable/uncomplicated ? ?EVALUATION COMPLEXITY: Low

## 2021-09-20 ENCOUNTER — Encounter: Payer: Self-pay | Admitting: Physical Therapy

## 2021-09-20 ENCOUNTER — Encounter (HOSPITAL_BASED_OUTPATIENT_CLINIC_OR_DEPARTMENT_OTHER): Payer: Self-pay | Admitting: Physical Therapy

## 2021-09-23 ENCOUNTER — Ambulatory Visit: Payer: Medicare Other | Admitting: Physical Therapy

## 2021-09-23 ENCOUNTER — Encounter: Payer: Self-pay | Admitting: Physical Therapy

## 2021-09-23 ENCOUNTER — Ambulatory Visit (INDEPENDENT_AMBULATORY_CARE_PROVIDER_SITE_OTHER): Payer: Medicare Other | Admitting: Physical Therapy

## 2021-09-23 DIAGNOSIS — M25561 Pain in right knee: Secondary | ICD-10-CM

## 2021-09-23 DIAGNOSIS — M6281 Muscle weakness (generalized): Secondary | ICD-10-CM | POA: Diagnosis not present

## 2021-09-23 DIAGNOSIS — M25661 Stiffness of right knee, not elsewhere classified: Secondary | ICD-10-CM

## 2021-09-23 NOTE — Therapy (Signed)
?OUTPATIENT PHYSICAL THERAPY TREATMENT ? ? ?Patient Name: Gregory [name] ?MRN: [id number] ?DOB:[birth year], 81 y.o., male ?Today's Date: 09/23/2021 ? ? ? PT End of Session - 09/23/21 1334   ? ? Visit Number 2   ? Number of Visits 16   ? Date for PT Re-Evaluation 11/14/21   ? Authorization Type Medicare   ? PT Start Time 1303   ? PT Stop Time [id number]   ? PT Time Calculation (min) 42 min   ? Activity Tolerance Patient tolerated treatment well   ? Behavior During Therapy [facility] for tasks assessed/performed   ? ?  ?  ? ?  ? ? ? ?Past Medical History:  ?Diagnosis Date  ? Arthritis   ? "right knee" (01/28/2016)  ? Diverticulosis of colon (without mention of hemorrhage)   ? Dysrhythmia   ? Hyperlipidemia   ? Internal hemorrhoids without mention of complication   ? Personal history of colonic polyps 01/17/2002  ? adenomatous, tublar adenoma 02/10/2007  ? PNA (pneumonia) 03/31/2012  ? ?Past Surgical History:  ?Procedure Laterality Date  ? ATRIAL FIBRILLATION ABLATION  11/28/2016  ? ATRIAL FIBRILLATION ABLATION N/A 11/28/2016  ? Procedure: Atrial Fibrillation Ablation;  Surgeon: Constance Haw, MD;  Location: [place] [facility];  Service: Cardiovascular;  Laterality: N/A;  ? ATRIAL FIBRILLATION ABLATION N/A 03/18/2019  ? Procedure: ATRIAL FIBRILLATION ABLATION;  Surgeon: Constance Haw, MD;  Location: [place] [facility];  Service: Cardiovascular;  Laterality: N/A;  ? CARDIOVERSION  05/03/2012  ? Procedure: CARDIOVERSION;  Surgeon: Laverda Page, MD;  Location: [place];  Service: Cardiovascular;  Laterality: N/A;  ? CARDIOVERSION  06/08/2012  ? Procedure: CARDIOVERSION;  Surgeon: Laverda Page, MD;  Location: [place];  Service: Cardiovascular;  Laterality: N/A;  [name]  ? CARDIOVERSION N/A 10/28/2013  ? Procedure: CARDIOVERSION;  Surgeon: Laverda Page, MD;  Location: [place];  Service: Cardiovascular;  Laterality: N/A;  ? CARDIOVERSION N/A 06/30/2014  ? Procedure: CARDIOVERSION;  Surgeon:  Laverda Page, MD;  Location: [facility] ENDOSCOPY;  Service: Cardiovascular;  Laterality: N/A;  H&P in file  ? CARDIOVERSION N/A 09/05/2014  ? Procedure: CARDIOVERSION;  Surgeon: Adrian Prows, MD;  Location: [place];  Service: Cardiovascular;  Laterality: N/A;  ? CARDIOVERSION N/A 12/24/2015  ? Procedure: CARDIOVERSION;  Surgeon: Adrian Prows, MD;  Location: [place];  Service: Cardiovascular;  Laterality: N/A;  ? CARDIOVERSION N/A 01/30/2016  ? Procedure: CARDIOVERSION;  Surgeon: Adrian Prows, MD;  Location: [place];  Service: Cardiovascular;  Laterality: N/A;  ? CARDIOVERSION N/A 02/08/2016  ? Procedure: CARDIOVERSION;  Surgeon: Adrian Prows, MD;  Location: [place];  Service: Cardiovascular;  Laterality: N/A;  ? CARDIOVERSION N/A 02/08/2019  ? Procedure: CARDIOVERSION;  Surgeon: Adrian Prows, MD;  Location: [place];  Service: Cardiovascular;  Laterality: N/A;  ? CARDIOVERSION N/A 05/24/2019  ? Procedure: CARDIOVERSION;  Surgeon: Elouise Munroe, MD;  Location: [place];  Service: Cardiovascular;  Laterality: N/A;  ? CARDIOVERSION N/A 09/21/2019  ? Procedure: CARDIOVERSION;  Surgeon: Adrian Prows, MD;  Location: [place];  Service: Cardiovascular;  Laterality: N/A;  ? CARDIOVERSION N/A 12/01/2019  ? Procedure: CARDIOVERSION;  Surgeon: Adrian Prows, MD;  Location: [place];  Service: Cardiovascular;  Laterality: N/A;  ? CARDIOVERSION N/A 08/20/2021  ? Procedure: CARDIOVERSION;  Surgeon: Adrian Prows, MD;  Location: [place];  Service: Cardiovascular;  Laterality: N/A;  ? CATARACT EXTRACTION W/ INTRAOCULAR LENS  IMPLANT, BILATERAL Bilateral   ? COLONOSCOPY    ? INGUINAL HERNIA REPAIR Bilateral   ?  as child  ? KNEE ARTHROSCOPY Right 2004  ? ?Patient Active Problem List  ? Diagnosis Date Noted  ? 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 BPH with elevated PSA 12/13/2019  ? Dermatitis 12/13/2019  ? Encounter for Medicare annual wellness exam 12/13/2019  ? Chronic pain of right knee  10/10/2019  ? Atypical atrial flutter (HCC) 05/17/2019  ? Acquired thrombophilia (HCC) 05/17/2019  ? 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/06/2018  ? Primary osteoarthritis of right knee 08/06/2018  ? Pes anserinus bursitis of left knee 10/31/2016  ? Allergic rhinitis 02/14/2016  ? Hearing loss 02/13/2016  ? CKD (chronic kidney disease) stage 3, GFR 30-59 ml/min (HCC) 02/13/2016  ? Atrial fibrillation (HCC) 02/07/2016  ? Paroxysmal atrial fibrillation (HCC) 03/31/2012  ? Hypertension 03/31/2012  ? Hemorrhoids, external 05/15/2011  ? ? ?PCP: [name], MD ? ?REFERRING PROVIDER: Dannielle Huh, MD ? ?REFERRING DIAG: S/P R TKE (uni)  ? ?THERAPY DIAG:  ?Acute pain of right knee ? ?Muscle weakness (generalized) ? ?Stiffness of right knee, not elsewhere classified ? ?ONSET DATE:  Surgery date: 09/11/21  ? ?SUBJECTIVE:  ? ?SUBJECTIVE STATEMENT: ? ?09/23/21: ?Pt with no new complaints. Has been doing HEP.  ? ?Eval: Pt had R Uni TKA 09/11/21. Using 4 [employer]. 3 steps to enter, 1 hand rail. Bedroom on first floor. Pt doing very well at this time, minimal pain. Has not had any home health yet.   ? ?PERTINENT HISTORY: ?Chronic A-Fib,  ? ?PAIN:  ?Are you having pain? Yes: NPRS scale: 1-3 /10 ?Pain location: R knee ?Pain description: achey ?Aggravating factors: increased activity  ?Relieving factors: Rest, ice  ? ? ?PRECAUTIONS: Knee ? ?WEIGHT BEARING RESTRICTIONS No ? ?FALLS:  ?Has patient fallen in last 6 months? No ? ? ?PLOF: Independent ? ?PATIENT GOALS  return to regular use of knee/leg, PLOF  ? ?OBJECTIVE:  ? ? ?COGNITION: ? Overall cognitive status: Within functional limits for tasks assessed   ?  ? ?PALPATION: ?R knee: healing incision, dressing in place. Mild swelling, wearing compression sock.  ? ? ?LE ROM:  Hip ROM: Mild limitation ? ?Passive ROM Right ?09/19/2021 R ?09/23/2021  ?    ?    ?    ?    ?    ?    ?Knee flexion 93 115  ?Knee extension -3   ?    ?    ?    ?    ? (Blank rows = not tested) ? ? ?LE MMT: ? Not tested  ? ? ?TODAY'S  TREATMENT: ? ?09/23/21: ?Therapeutic Exercise: ?Aerobic: ?Supine: Heel slides x 10, with strap- x 20; Quad sets x 20 bil; SLR 2x 10;  ?Seated:  LAQ x2x10; Heel slides x 20;  ?Standing: ?Stretches:  ?Neuromuscular Re-education: ?Manual Therapy:  PROM for R knee flex and ext; Joint mobs to inc extension;  ?Therapeutic Activity: ?Self Care: ?Modalities:  ? ? ?PATIENT EDUCATION:  ?Education details: updated and reviewed HEP ?Person educated: Patient and Spouse ?Education method: Explanation, Demonstration, Tactile cues, Verbal cues, and Handouts ?Education comprehension: verbalized understanding, returned demonstration, verbal cues required, tactile cues required, and needs further education ? ? ?HOME EXERCISE PROGRAM: ?Access Code: [email or web address] ? ? ?ASSESSMENT: ? ?CLINICAL IMPRESSION: ?Pt with improving ROM for flexion. Doing well with ability for active quad contraction. Reviewed optimal frequency for HEP today. Pt did well with education on initial cane use, recommended he use cane only for short distances at this time, around the  house, and still use RW for going out of house.  ? ? ?OBJECTIVE IMPAIRMENTS Abnormal gait, decreased activity tolerance, decreased balance, decreased endurance, decreased knowledge of use of DME, decreased mobility, difficulty walking, decreased ROM, decreased strength, hypomobility, increased muscle spasms, impaired flexibility, and pain.  ? ?ACTIVITY LIMITATIONS cleaning, community activity, driving, meal prep, yard work, and shopping.  ? ?PERSONAL FACTORS  none  are also affecting patient's functional outcome.  ? ? ?REHAB POTENTIAL: Good ? ?CLINICAL DECISION MAKING: Stable/uncomplicated ? ?EVALUATION COMPLEXITY: Low ? ? ?GOALS: ?Goals reviewed with patient? Yes ? ?SHORT TERM GOALS: Target date: 10/03/2021 ? ?Pt to be independent with initial HEP ?Goal status: INITIAL ? ?2.  Pt to demo improved knee flexion by at least 15 degrees  ?Goal status: INITIAL ? ?3.  Pt to report pain levels 0-3/10  with activity.  ?Goal status: INITIAL ? ? ? ?LONG TERM GOALS: Target date: 11/14/2021 ? ?Pt to be independent with final HEP ?Goal status: INITIAL ? ?2.  Pt to report decreased pain in R knee to 0-2/10 with a

## 2021-09-25 ENCOUNTER — Encounter: Payer: Self-pay | Admitting: Physical Therapy

## 2021-09-25 ENCOUNTER — Ambulatory Visit (INDEPENDENT_AMBULATORY_CARE_PROVIDER_SITE_OTHER): Payer: Medicare Other | Admitting: Physical Therapy

## 2021-09-25 ENCOUNTER — Encounter: Payer: Medicare Other | Admitting: Physical Therapy

## 2021-09-25 DIAGNOSIS — M25661 Stiffness of right knee, not elsewhere classified: Secondary | ICD-10-CM

## 2021-09-25 DIAGNOSIS — M25561 Pain in right knee: Secondary | ICD-10-CM | POA: Diagnosis not present

## 2021-09-25 DIAGNOSIS — M6281 Muscle weakness (generalized): Secondary | ICD-10-CM

## 2021-09-25 NOTE — Therapy (Signed)
?OUTPATIENT PHYSICAL THERAPY TREATMENT ? ? ?Patient Name: Gregory [name] ?MRN: [id number] ?DOB:10/08/1940, 81 y.o., male ?Today's Date: 09/25/2021 ? ? ? PT End of Session - 09/25/21 1512   ? ? Visit Number 3   ? Number of Visits 16   ? Date for PT Re-Evaluation 11/14/21   ? Authorization Type Medicare   ? PT Start Time 1515   ? PT Stop Time [id number]   ? PT Time Calculation (min) 40 min   ? Activity Tolerance Patient tolerated treatment well   ? Behavior During Therapy [facility] for tasks assessed/performed   ? ?  ?  ? ?  ? ? ? ?Past Medical History:  ?Diagnosis Date  ? Arthritis   ? "right knee" (01/28/2016)  ? Diverticulosis of colon (without mention of hemorrhage)   ? Dysrhythmia   ? Hyperlipidemia   ? Internal hemorrhoids without mention of complication   ? Personal history of colonic polyps 01/17/2002  ? adenomatous, tublar adenoma 02/10/2007  ? PNA (pneumonia) 03/31/2012  ? ?Past Surgical History:  ?Procedure Laterality Date  ? ATRIAL FIBRILLATION ABLATION  11/28/2016  ? ATRIAL FIBRILLATION ABLATION N/A 11/28/2016  ? Procedure: Atrial Fibrillation Ablation;  Surgeon: Constance Haw, MD;  Location: [place] [facility];  Service: Cardiovascular;  Laterality: N/A;  ? ATRIAL FIBRILLATION ABLATION N/A 03/18/2019  ? Procedure: ATRIAL FIBRILLATION ABLATION;  Surgeon: Constance Haw, MD;  Location: [place] [facility];  Service: Cardiovascular;  Laterality: N/A;  ? CARDIOVERSION  05/03/2012  ? Procedure: CARDIOVERSION;  Surgeon: Laverda Page, MD;  Location: [place];  Service: Cardiovascular;  Laterality: N/A;  ? CARDIOVERSION  06/08/2012  ? Procedure: CARDIOVERSION;  Surgeon: Laverda Page, MD;  Location: [place];  Service: Cardiovascular;  Laterality: N/A;  [name]  ? CARDIOVERSION N/A 10/28/2013  ? Procedure: CARDIOVERSION;  Surgeon: Laverda Page, MD;  Location: [place];  Service: Cardiovascular;  Laterality: N/A;  ? CARDIOVERSION N/A 06/30/2014  ? Procedure: CARDIOVERSION;  Surgeon:  Laverda Page, MD;  Location: [facility] END[facility];  Service: Cardiovascular;  Laterality: N/A;  H&P in file  ? CARDIOVERSION N/A 09/05/2014  ? Procedure: CARDIOVERSION;  Surgeon: Adrian Prows, MD;  Location: [place];  Service: Cardiovascular;  Laterality: N/A;  ? CARDIOVERSION N/A 12/24/2015  ? Procedure: CARDIOVERSION;  Surgeon: Adrian Prows, MD;  Location: [place];  Service: Cardiovascular;  Laterality: N/A;  ? CARDIOVERSION N/A 01/30/2016  ? Procedure: CARDIOVERSION;  Surgeon: Adrian Prows, MD;  Location: [place];  Service: Cardiovascular;  Laterality: N/A;  ? CARDIOVERSION N/A 02/08/2016  ? Procedure: CARDIOVERSION;  Surgeon: Adrian Prows, MD;  Location: [place];  Service: Cardiovascular;  Laterality: N/A;  ? CARDIOVERSION N/A 02/08/2019  ? Procedure: CARDIOVERSION;  Surgeon: Adrian Prows, MD;  Location: [place];  Service: Cardiovascular;  Laterality: N/A;  ? CARDIOVERSION N/A 05/24/2019  ? Procedure: CARDIOVERSION;  Surgeon: Elouise Munroe, MD;  Location: [place];  Service: Cardiovascular;  Laterality: N/A;  ? CARDIOVERSION N/A 09/21/2019  ? Procedure: CARDIOVERSION;  Surgeon: Adrian Prows, MD;  Location: [place];  Service: Cardiovascular;  Laterality: N/A;  ? CARDIOVERSION N/A 12/01/2019  ? Procedure: CARDIOVERSION;  Surgeon: Adrian Prows, MD;  Location: [place];  Service: Cardiovascular;  Laterality: N/A;  ? CARDIOVERSION N/A 08/20/2021  ? Procedure: CARDIOVERSION;  Surgeon: Adrian Prows, MD;  Location: [employer];  Service: Cardiovascular;  Laterality: N/A;  ? CATARACT EXTRACTION W/ INTRAOCULAR LENS  IMPLANT, BILATERAL Bilateral   ? COLONOSCOPY    ? INGUINAL HERNIA REPAIR Bilateral   ?  as child  ? KNEE ARTHROSCOPY Right 2004  ? ?Patient Active Problem List  ? Diagnosis Date Noted  ? 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 BPH with elevated PSA 12/13/2019  ? Dermatitis 12/13/2019  ? Encounter for Medicare annual wellness exam 12/13/2019  ? Chronic pain of right knee  10/10/2019  ? Atypical atrial flutter ([place]) 05/17/2019  ? Acquired thrombophilia ([place]) 05/17/2019  ? 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/06/2018  ? Primary osteoarthritis of right knee 08/06/2018  ? Pes anserinus bursitis of left knee 10/31/2016  ? Allergic rhinitis 02/14/2016  ? Hearing loss 02/13/2016  ? CKD (chronic kidney disease) stage 3, GFR 30-59 ml/min (HCC) 02/13/2016  ? Atrial fibrillation ([place]) 02/07/2016  ? Paroxysmal atrial fibrillation ([place]) 03/31/2012  ? Hypertension 03/31/2012  ? Hemorrhoids, external 05/15/2011  ? ? ?PCP: Christain Sacramento, MD ? ?REFERRING PROVIDER: Christain Sacramento, MD ? ?REFERRING DIAG: S/P R TKE (uni)  ? ?THERAPY DIAG:  ?Acute pain of right knee ? ?Muscle weakness (generalized) ? ?Stiffness of right knee, not elsewhere classified ? ?ONSET DATE:  Surgery date: 09/11/21  ? ?SUBJECTIVE:  ? ?SUBJECTIVE STATEMENT: ? ?09/23/21: ?Pt with no new complaints. Has been doing HEP.  States he hasn't walked much with the cane since last session but he brought it. ? ?Eval: Pt had R Uni TKA 09/11/21. Using 4 [employer]. 3 steps to enter, 1 hand rail. Bedroom on first floor. Pt doing very well at this time, minimal pain. Has not had any home health yet.   ? ?PERTINENT HISTORY: ?Chronic A-Fib,  ? ?PAIN:  ?Are you having pain? Yes: NPRS scale: 2/10 ?Pain location: R knee ?Pain description: achey ?Aggravating factors: increased activity  ?Relieving factors: Rest, ice  ? ? ?PRECAUTIONS: Knee ? ?WEIGHT BEARING RESTRICTIONS No ? ?FALLS:  ?Has patient fallen in last 6 months? No ? ? ?PLOF: Independent ? ?PATIENT GOALS  return to regular use of knee/leg, PLOF  ? ?OBJECTIVE:  ? ? ?COGNITION: ? Overall cognitive status: Within functional limits for tasks assessed   ?  ? ?PALPATION: ?R knee: healing incision, dressing in place. Mild swelling, wearing compression sock.  ? ? ?LE ROM:  Hip ROM: Mild limitation ? ?Passive ROM Right ?09/19/2021 R ?09/25/2021  ?    ?    ?    ?    ?    ?    ?Knee flexion 93 112  ?Knee extension -3 -3  ?    ?     ?    ?    ? (Blank rows = not tested) ? ? ?LE MMT: ? Not tested  ? ? ?TODAY'S TREATMENT: ? ?09/23/21: ?Therapeutic Exercise: ?Aerobic: bike rocking initially with PT assist then complete revolutions 5 minutes ?Supine: knee flexion extension on ball 2 minutes, knee extension on ball 5" holds 2 minutes, tibial bouncing on ball 2 minutes R PT performed, SAQs on large ball PT assisted with end range motion R x25 2" holds ?Heel slides x 10 R ?Seated:  Heel slides x 20 R; hamstring stretch x3 30" holds R, self massage with tigertail 4 minutes R leg ?Standing: ?Stretches:  ?Neuromuscular Re-education: ?Manual Therapy:  edema massage to right posterior knee and leg ?Gait Training: 10 minutes with cane - cues to not shuffle feet and heel toe gait ?Self Care: ?Modalities:  ? ? ?PATIENT EDUCATION:  ?Education details: educated on importance of elevation, how to elevate and self massage. On importance of daily exercises and performing  HEP ?Person educated: Patient and Spouse ?Education method: Explanation, Demonstration, Tactile cues, Verbal cues, and Handouts ?Education comprehension: verbalized understanding, returned demonstration, verbal cues required, tactile cues required, and needs further education ? ? ?HOME EXERCISE PROGRAM: ?Access Code: [email or web address] ? ? ?ASSESSMENT: ? ?CLINICAL IMPRESSION: ?Overall knee flexion is doing very well, continues to lack end range knee extension but posterior knee swelling noted on this date. Educated patient on gait mechanics and practicing walking in home as he reported he doesn't do much.Educated in elevation for swelling and self massage in muscles. No increase in symptoms noted end of session. ? ? ?OBJECTIVE IMPAIRMENTS Abnormal gait, decreased activity tolerance, decreased balance, decreased endurance, decreased knowledge of use of DME, decreased mobility, difficulty walking, decreased ROM, decreased strength, hypomobility, increased muscle spasms, impaired flexibility, and pain.   ? ?ACTIVITY LIMITATIONS cleaning, community activity, driving, meal prep, yard work, and shopping.  ? ?PERSONAL FACTORS  none  are also affecting patient's functional outcome.  ? ? ?REHAB POTENTIAL: Good ?

## 2021-09-27 ENCOUNTER — Ambulatory Visit (INDEPENDENT_AMBULATORY_CARE_PROVIDER_SITE_OTHER): Payer: Medicare Other | Admitting: Physical Therapy

## 2021-09-27 ENCOUNTER — Encounter: Payer: Self-pay | Admitting: Physical Therapy

## 2021-09-27 DIAGNOSIS — M25561 Pain in right knee: Secondary | ICD-10-CM

## 2021-09-27 DIAGNOSIS — M25661 Stiffness of right knee, not elsewhere classified: Secondary | ICD-10-CM | POA: Diagnosis not present

## 2021-09-27 DIAGNOSIS — M6281 Muscle weakness (generalized): Secondary | ICD-10-CM | POA: Diagnosis not present

## 2021-09-27 NOTE — Therapy (Signed)
?OUTPATIENT PHYSICAL THERAPY TREATMENT ? ? ?Patient Name: Gregory [name] ?MRN: [id number] ?DOB:12/13/1940, 81 y.o., male ?Today's Date: 09/27/2021 ? ? ? PT End of Session - 09/27/21 1231   ? ? Visit Number 4   ? Number of Visits 16   ? Date for PT Re-Evaluation 11/14/21   ? Authorization Type Medicare   ? PT Start Time 1233   ? PT Stop Time 1315   ? PT Time Calculation (min) 42 min   ? Activity Tolerance Patient tolerated treatment well   ? Behavior During Therapy [facility] for tasks assessed/performed   ? ?  ?  ? ?  ? ? ? ?Past Medical History:  ?Diagnosis Date  ? Arthritis   ? "right knee" (01/28/2016)  ? Diverticulosis of colon (without mention of hemorrhage)   ? Dysrhythmia   ? Hyperlipidemia   ? Internal hemorrhoids without mention of complication   ? Personal history of colonic polyps 01/17/2002  ? adenomatous, tublar adenoma 02/10/2007  ? PNA (pneumonia) 03/31/2012  ? ?Past Surgical History:  ?Procedure Laterality Date  ? ATRIAL FIBRILLATION ABLATION  11/28/2016  ? ATRIAL FIBRILLATION ABLATION N/A 11/28/2016  ? Procedure: Atrial Fibrillation Ablation;  Surgeon: Constance Haw, MD;  Location: [place] [facility];  Service: Cardiovascular;  Laterality: N/A;  ? ATRIAL FIBRILLATION ABLATION N/A 03/18/2019  ? Procedure: ATRIAL FIBRILLATION ABLATION;  Surgeon: Constance Haw, MD;  Location: [place] [facility];  Service: Cardiovascular;  Laterality: N/A;  ? CARDIOVERSION  05/03/2012  ? Procedure: CARDIOVERSION;  Surgeon: Laverda Page, MD;  Location: [place];  Service: Cardiovascular;  Laterality: N/A;  ? CARDIOVERSION  06/08/2012  ? Procedure: CARDIOVERSION;  Surgeon: Laverda Page, MD;  Location: [place];  Service: Cardiovascular;  Laterality: N/A;  [name]  ? CARDIOVERSION N/A 10/28/2013  ? Procedure: CARDIOVERSION;  Surgeon: Laverda Page, MD;  Location: [place];  Service: Cardiovascular;  Laterality: N/A;  ? CARDIOVERSION N/A 06/30/2014  ? Procedure: CARDIOVERSION;  Surgeon:  Laverda Page, MD;  Location: [facility] ENDOSCOPY;  Service: Cardiovascular;  Laterality: N/A;  H&P in file  ? CARDIOVERSION N/A 09/05/2014  ? Procedure: CARDIOVERSION;  Surgeon: Adrian Prows, MD;  Location: [place];  Service: Cardiovascular;  Laterality: N/A;  ? CARDIOVERSION N/A 12/24/2015  ? Procedure: CARDIOVERSION;  Surgeon: Adrian Prows, MD;  Location: [place];  Service: Cardiovascular;  Laterality: N/A;  ? CARDIOVERSION N/A 01/30/2016  ? Procedure: CARDIOVERSION;  Surgeon: Adrian Prows, MD;  Location: [place];  Service: Cardiovascular;  Laterality: N/A;  ? CARDIOVERSION N/A 02/08/2016  ? Procedure: CARDIOVERSION;  Surgeon: Adrian Prows, MD;  Location: [place];  Service: Cardiovascular;  Laterality: N/A;  ? CARDIOVERSION N/A 02/08/2019  ? Procedure: CARDIOVERSION;  Surgeon: Adrian Prows, MD;  Location: [place];  Service: Cardiovascular;  Laterality: N/A;  ? CARDIOVERSION N/A 05/24/2019  ? Procedure: CARDIOVERSION;  Surgeon: Elouise Munroe, MD;  Location: [place];  Service: Cardiovascular;  Laterality: N/A;  ? CARDIOVERSION N/A 09/21/2019  ? Procedure: CARDIOVERSION;  Surgeon: Adrian Prows, MD;  Location: [place];  Service: Cardiovascular;  Laterality: N/A;  ? CARDIOVERSION N/A 12/01/2019  ? Procedure: CARDIOVERSION;  Surgeon: Adrian Prows, MD;  Location: [place];  Service: Cardiovascular;  Laterality: N/A;  ? CARDIOVERSION N/A 08/20/2021  ? Procedure: CARDIOVERSION;  Surgeon: Adrian Prows, MD;  Location: [place];  Service: Cardiovascular;  Laterality: N/A;  ? CATARACT EXTRACTION W/ INTRAOCULAR LENS  IMPLANT, BILATERAL Bilateral   ? COLONOSCOPY    ? INGUINAL HERNIA REPAIR Bilateral   ?  as child  ? KNEE ARTHROSCOPY Right 2004  ? ?Patient Active Problem List  ? Diagnosis Date Noted  ? 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 BPH with elevated PSA 12/13/2019  ? Dermatitis 12/13/2019  ? Encounter for Medicare annual wellness exam 12/13/2019  ? Chronic pain of right knee  10/10/2019  ? Atypical atrial flutter ([place]) 05/17/2019  ? Acquired thrombophilia ([place]) 05/17/2019  ? 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/06/2018  ? Primary osteoarthritis of right knee 08/06/2018  ? Pes anserinus bursitis of left knee 10/31/2016  ? Allergic rhinitis 02/14/2016  ? Hearing loss 02/13/2016  ? CKD (chronic kidney disease) stage 3, GFR 30-59 ml/min (HCC) 02/13/2016  ? Atrial fibrillation ([place]) 02/07/2016  ? Paroxysmal atrial fibrillation ([place]) 03/31/2012  ? Hypertension 03/31/2012  ? Hemorrhoids, external 05/15/2011  ? ? ?PCP: Christain Sacramento, MD ? ?REFERRING PROVIDER: Vickey Huger, MD ? ?REFERRING DIAG: S/P R TKE (uni)  ? ?THERAPY DIAG:  ?Acute pain of right knee ? ?Muscle weakness (generalized) ? ?Stiffness of right knee, not elsewhere classified ? ?ONSET DATE:  Surgery date: 09/11/21  ? ?SUBJECTIVE:  ? ?SUBJECTIVE STATEMENT: ? Pt states doing very well. Mild pain. Has been using cane more.  ? ?Eval: Pt had R Uni TKA 09/11/21. Using 4 [employer]. 3 steps to enter, 1 hand rail. Bedroom on first floor. Pt doing very well at this time, minimal pain. Has not had any home health yet.   ? ?PERTINENT HISTORY: ?Chronic A-Fib,  ? ?PAIN:  ?Are you having pain? Yes: NPRS scale: 2-3/10 ?Pain location: R knee ?Pain description: achey ?Aggravating factors: increased activity  ?Relieving factors: Rest, ice  ? ? ?PRECAUTIONS: Knee ? ?WEIGHT BEARING RESTRICTIONS No ? ?FALLS:  ?Has patient fallen in last 6 months? No ? ? ?PLOF: Independent ? ?PATIENT GOALS  return to regular use of knee/leg, PLOF  ? ?OBJECTIVE:  ? ? ?COGNITION: ? Overall cognitive status: Within functional limits for tasks assessed   ?  ? ?PALPATION: ?R knee: healing incision, dressing in place. Mild swelling, wearing compression sock.  ? ? ?LE ROM:  Hip ROM: Mild limitation ? ?Passive ROM Right ?09/19/2021 R ?09/27/2021  ?    ?    ?    ?    ?    ?    ?Knee flexion 93 112  ?Knee extension -3 -3  ?    ?    ?    ?    ? (Blank rows = not tested) ? ? ?LE MMT: ? Not tested   ? ? ?TODAY'S TREATMENT: ?09/27/2021 ?Therapeutic Exercise: ?Aerobic: recumbent bike L 1 x 7 min;  ?Supine:  SAQ x 20 on R ;  Heel slides x 10 R with strap;  Prone HS curl x 10 for ROM;  ?Seated:  Heel slides x 20 R;  ?Standing: ambulation with SPC 45 ft x 8 with cues for heel to toe;  March x 20;   R HSC x 15;  ?Stretches: Seated HSS 30 sec x 3;  ?Neuromuscular Re-education: ?Manual Therapy:  PROM for R knee flex and ext; Prone quad stretch ;  ?Gait Training: ?Self Care: ? ? ?09/23/21: ?Therapeutic Exercise: ?Aerobic: bike rocking initially with PT assist then complete revolutions 5 minutes ?Supine: knee flexion extension on ball 2 minutes, knee extension on ball 5" holds 2 minutes, tibial bouncing on ball 2 minutes R PT performed, SAQs on large ball PT assisted with end range motion R x25 2" holds ?Heel  slides x 10 R ?Seated:  Heel slides x 20 R; hamstring stretch x3 30" holds R, self massage with tigertail 4 minutes R leg ?Standing: ?Stretches:  ?Neuromuscular Re-education: ?Manual Therapy:  edema massage to right posterior knee and leg ?Gait Training: 10 minutes with cane - cues to not shuffle feet and heel toe gait ?Self Care: ?Modalities:  ? ? ?PATIENT EDUCATION:  ?Education details: reviewed HEP.  ?Person educated: Patient and Spouse ?Education method: Explanation, Demonstration, Tactile cues, Verbal cues, and Handouts ?Education comprehension: verbalized understanding, returned demonstration, verbal cues required, tactile cues required, and needs further education ? ? ?HOME EXERCISE PROGRAM: ?Access Code: [id number] ? ? ?ASSESSMENT: ? ?CLINICAL IMPRESSION: ?Pt with improving ROM for extension today. He has good ability for ambulation with SPC, with improving mechanics from previous weeks. He will benefit from continued strengthening as tolerated, moving to more standing ther ex as able.  ? ?OBJECTIVE IMPAIRMENTS Abnormal gait, decreased activity tolerance, decreased balance, decreased endurance, decreased  knowledge of use of DME, decreased mobility, difficulty walking, decreased ROM, decreased strength, hypomobility, increased muscle spasms, impaired flexibility, and pain.  ? ?ACTIVITY LIMITATIONS cleaning, community

## 2021-10-01 ENCOUNTER — Encounter: Payer: Self-pay | Admitting: Physical Therapy

## 2021-10-01 ENCOUNTER — Ambulatory Visit (INDEPENDENT_AMBULATORY_CARE_PROVIDER_SITE_OTHER): Payer: Medicare Other | Admitting: Physical Therapy

## 2021-10-01 DIAGNOSIS — M6281 Muscle weakness (generalized): Secondary | ICD-10-CM

## 2021-10-01 DIAGNOSIS — M25561 Pain in right knee: Secondary | ICD-10-CM

## 2021-10-01 DIAGNOSIS — M25661 Stiffness of right knee, not elsewhere classified: Secondary | ICD-10-CM

## 2021-10-01 NOTE — Therapy (Addendum)
?OUTPATIENT PHYSICAL THERAPY TREATMENT ? ? ?Patient Name: Gregory [name] ?MRN: [id number] ?DOB:12/19/1940, 81 y.o., male ?Today's Date: 10/01/2021 ? ? ? PT End of Session - 10/01/21 1023   ? ? Visit Number 5   ? Number of Visits 16   ? Date for PT Re-Evaluation 11/14/21   ? Authorization Type Medicare   ? PT Start Time 1022   ? PT Stop Time 1101   ? PT Time Calculation (min) 39 min   ? Activity Tolerance Patient tolerated treatment well   ? Behavior During Therapy [facility] for tasks assessed/performed   ? ?  ?  ? ?  ? ? ? ?Past Medical History:  ?Diagnosis Date  ? Arthritis   ? "right knee" (01/28/2016)  ? Diverticulosis of colon (without mention of hemorrhage)   ? Dysrhythmia   ? Hyperlipidemia   ? Internal hemorrhoids without mention of complication   ? Personal history of colonic polyps 01/17/2002  ? adenomatous, tublar adenoma 02/10/2007  ? PNA (pneumonia) 03/31/2012  ? ?Past Surgical History:  ?Procedure Laterality Date  ? ATRIAL FIBRILLATION ABLATION  11/28/2016  ? ATRIAL FIBRILLATION ABLATION N/A 11/28/2016  ? Procedure: Atrial Fibrillation Ablation;  Surgeon: Constance Haw, MD;  Location: [place] [facility];  Service: Cardiovascular;  Laterality: N/A;  ? ATRIAL FIBRILLATION ABLATION N/A 03/18/2019  ? Procedure: ATRIAL FIBRILLATION ABLATION;  Surgeon: Constance Haw, MD;  Location: [place] [facility];  Service: Cardiovascular;  Laterality: N/A;  ? CARDIOVERSION  05/03/2012  ? Procedure: CARDIOVERSION;  Surgeon: Laverda Page, MD;  Location: [place];  Service: Cardiovascular;  Laterality: N/A;  ? CARDIOVERSION  06/08/2012  ? Procedure: CARDIOVERSION;  Surgeon: Laverda Page, MD;  Location: [place];  Service: Cardiovascular;  Laterality: N/A;  [name]  ? CARDIOVERSION N/A 10/28/2013  ? Procedure: CARDIOVERSION;  Surgeon: Laverda Page, MD;  Location: [place];  Service: Cardiovascular;  Laterality: N/A;  ? CARDIOVERSION N/A 06/30/2014  ? Procedure: CARDIOVERSION;  Surgeon:  Laverda Page, MD;  Location: [facility];  Service: Cardiovascular;  Laterality: N/A;  H&P in file  ? CARDIOVERSION N/A 09/05/2014  ? Procedure: CARDIOVERSION;  Surgeon: Adrian Prows, MD;  Location: [place];  Service: Cardiovascular;  Laterality: N/A;  ? CARDIOVERSION N/A 12/24/2015  ? Procedure: CARDIOVERSION;  Surgeon: Adrian Prows, MD;  Location: [place];  Service: Cardiovascular;  Laterality: N/A;  ? CARDIOVERSION N/A 01/30/2016  ? Procedure: CARDIOVERSION;  Surgeon: Adrian Prows, MD;  Location: [place];  Service: Cardiovascular;  Laterality: N/A;  ? CARDIOVERSION N/A 02/08/2016  ? Procedure: CARDIOVERSION;  Surgeon: Adrian Prows, MD;  Location: [place];  Service: Cardiovascular;  Laterality: N/A;  ? CARDIOVERSION N/A 02/08/2019  ? Procedure: CARDIOVERSION;  Surgeon: Adrian Prows, MD;  Location: [place];  Service: Cardiovascular;  Laterality: N/A;  ? CARDIOVERSION N/A 05/24/2019  ? Procedure: CARDIOVERSION;  Surgeon: Elouise Munroe, MD;  Location: [place];  Service: Cardiovascular;  Laterality: N/A;  ? CARDIOVERSION N/A 09/21/2019  ? Procedure: CARDIOVERSION;  Surgeon: Adrian Prows, MD;  Location: [place];  Service: Cardiovascular;  Laterality: N/A;  ? CARDIOVERSION N/A 12/01/2019  ? Procedure: CARDIOVERSION;  Surgeon: Adrian Prows, MD;  Location: [place];  Service: Cardiovascular;  Laterality: N/A;  ? CARDIOVERSION N/A 08/20/2021  ? Procedure: CARDIOVERSION;  Surgeon: Adrian Prows, MD;  Location: [place];  Service: Cardiovascular;  Laterality: N/A;  ? CATARACT EXTRACTION W/ INTRAOCULAR LENS  IMPLANT, BILATERAL Bilateral   ? COLONOSCOPY    ? INGUINAL HERNIA REPAIR Bilateral   ?  as child  ? KNEE ARTHROSCOPY Right 2004  ? ?Patient Active Problem List  ? Diagnosis Date Noted  ? 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 BPH with elevated PSA 12/13/2019  ? Dermatitis 12/13/2019  ? Encounter for Medicare annual wellness exam 12/13/2019  ? Chronic pain of right knee  10/10/2019  ? Atypical atrial flutter ([place]) 05/17/2019  ? Acquired thrombophilia ([place]) 05/17/2019  ? 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/06/2018  ? Primary osteoarthritis of right knee 08/06/2018  ? Pes anserinus bursitis of left knee 10/31/2016  ? Allergic rhinitis 02/14/2016  ? Hearing loss 02/13/2016  ? CKD (chronic kidney disease) stage 3, GFR 30-59 ml/min (HCC) 02/13/2016  ? Atrial fibrillation ([place]) 02/07/2016  ? Paroxysmal atrial fibrillation ([place]) 03/31/2012  ? Hypertension 03/31/2012  ? Hemorrhoids, external 05/15/2011  ? ? ?PCP: Christain Sacramento, MD ? ?REFERRING PROVIDER: Vickey Huger, MD ? ?REFERRING DIAG: S/P R TKE (uni)  ? ?THERAPY DIAG:  ?Acute pain of right knee ? ?Muscle weakness (generalized) ? ?Stiffness of right knee, not elsewhere classified ? ?ONSET DATE:  Surgery date: 09/11/21 R Uni TKA  ? ?SUBJECTIVE:  ? ?SUBJECTIVE STATEMENT: ? ? Pt with no new complaints.  ? ? ?PERTINENT HISTORY: ?Chronic A-Fib,  ? ?PAIN:  ?Are you having pain? Yes: NPRS scale: 2/10 ?Pain location: R knee ?Pain description: achey ?Aggravating factors: increased activity  ?Relieving factors: Rest, ice  ? ? ?PRECAUTIONS: Knee ? ?WEIGHT BEARING RESTRICTIONS No ? ?FALLS:  ?Has patient fallen in last 6 months? No ? ? ?PLOF: Independent ? ?PATIENT GOALS  return to regular use of knee/leg, PLOF  ? ?OBJECTIVE:  ? ? ?COGNITION: ? Overall cognitive status: Within functional limits for tasks assessed   ?  ? ?PALPATION: ?R knee: healing incision, dressing in place. Mild swelling, wearing compression sock.  ? ? ?LE ROM:  Hip ROM: Mild limitation ? ?Passive ROM Right ?09/19/2021 R ?09/23/2021 R ?10/01/21  ?     ?     ?     ?     ?     ?     ?Knee flexion 93 112 113  ?Knee extension -3 -3   ?     ?     ?     ?     ? (Blank rows = not tested) ? ? ?LE MMT: ? Not tested  ? ? ?TODAY'S TREATMENT: ?10/01/2021 ?Therapeutic Exercise: ?Aerobic: recumbent bike L2  x 6 min;  ?Supine:  SAQ x 20 on R 2.5lb ;  Heel slides x 10 R with and without strap;  SLR x 20 on R;    ?Seated: LAQ 2 x10 on R;  ?Standing: ambulation without AD  45 ft x 6  ?Stretches:  ?Neuromuscular Re-education: ?Manual Therapy:  PROM for R knee flex and ext; Manual HSS; STM and ISTM for R quad;  ?Gait Training: ?Self Care: ? ? ?PATIENT EDUCATION:  ?Education details: reviewed HEP.  ?Person educated: Patient and Spouse ?Education method: Explanation, Demonstration, Tactile cues, Verbal cues, and Handouts ?Education comprehension: verbalized understanding, returned demonstration, verbal cues required, tactile cues required, and needs further education ? ? ?HOME EXERCISE PROGRAM: ?Access Code: [id number] ? ? ?ASSESSMENT: ? ?CLINICAL IMPRESSION: ?Pt progressing well. Focus on flexion ROM today, and improving quad tightness. Pt with noted improvements in quad strength. Will benefit from progressive strengthening as able.  ? ?OBJECTIVE IMPAIRMENTS Abnormal gait, decreased activity tolerance, decreased balance, decreased endurance, decreased knowledge of use of DME, decreased  mobility, difficulty walking, decreased ROM, decreased strength, hypomobility, increased muscle spasms, impaired flexibility, and pain.  ? ?ACTIVITY LIMITATIONS cleaning, community activity, driving, meal prep, yard work, and shopping.  ? ?PERSONAL FACTORS  none  are also affecting patient's functional outcome.  ? ? ?REHAB POTENTIAL: Good ? ?CLINICAL DECISION MAKING: Stable/uncomplicated ? ?EVALUATION COMPLEXITY: Low ? ? ?GOALS: ?Goals reviewed with patient? Yes ? ?SHORT TERM GOALS: Target date: 10/03/2021 ? ?Pt to be independent with initial HEP ?Goal status: INITIAL ? ?2.  Pt to demo improved knee flexion by at least 15 degrees  ?Goal status: INITIAL ? ?3.  Pt to report pain levels 0-3/10 with activity.  ?Goal status: INITIAL ? ? ? ?LONG TERM GOALS: Target date: 11/14/2021 ? ?Pt to be independent with final HEP ?Goal status: INITIAL ? ?2.  Pt to report decreased pain in R knee to 0-2/10 with activity  ?Goal status: INITIAL ? ?3.  Pt to demo improved  R knee ROM to be WNL for flex and extension  ?Baseline: -3 to 93 deg  ?Goal status: INITIAL ? ?4.  Pt to demo independent ambulation with LRAD, for at least 500 ft , to improve ability for community na

## 2021-10-03 ENCOUNTER — Ambulatory Visit (INDEPENDENT_AMBULATORY_CARE_PROVIDER_SITE_OTHER): Payer: Medicare Other | Admitting: Physical Therapy

## 2021-10-03 ENCOUNTER — Encounter: Payer: Self-pay | Admitting: Physical Therapy

## 2021-10-03 DIAGNOSIS — M6281 Muscle weakness (generalized): Secondary | ICD-10-CM

## 2021-10-03 DIAGNOSIS — M25661 Stiffness of right knee, not elsewhere classified: Secondary | ICD-10-CM | POA: Diagnosis not present

## 2021-10-03 DIAGNOSIS — M25561 Pain in right knee: Secondary | ICD-10-CM | POA: Diagnosis not present

## 2021-10-03 NOTE — Therapy (Addendum)
?OUTPATIENT PHYSICAL THERAPY TREATMENT ? ? ?Patient Name: Gregory [name] ?MRN: [id number] ?DOB:Oct 31, 1940, 81 y.o., male ?Today's Date: 10/03/2021 ? ? ? PT End of Session - 10/03/21 1017   ? ? Visit Number 6   ? Number of Visits 16   ? Date for PT Re-Evaluation 11/14/21   ? Authorization Type Medicare   ? PT Start Time 1019   ? PT Stop Time 1100   ? PT Time Calculation (min) 41 min   ? Activity Tolerance Patient tolerated treatment well   ? Behavior During Therapy [facility] for tasks assessed/performed   ? ?  ?  ? ?  ? ? ? ?Past Medical History:  ?Diagnosis Date  ? Arthritis   ? "right knee" (01/28/2016)  ? Diverticulosis of colon (without mention of hemorrhage)   ? Dysrhythmia   ? Hyperlipidemia   ? Internal hemorrhoids without mention of complication   ? Personal history of colonic polyps 01/17/2002  ? adenomatous, tublar adenoma 02/10/2007  ? PNA (pneumonia) 03/31/2012  ? ?Past Surgical History:  ?Procedure Laterality Date  ? ATRIAL FIBRILLATION ABLATION  11/28/2016  ? ATRIAL FIBRILLATION ABLATION N/A 11/28/2016  ? Procedure: Atrial Fibrillation Ablation;  Surgeon: Constance Haw, MD;  Location: [place] [facility];  Service: Cardiovascular;  Laterality: N/A;  ? ATRIAL FIBRILLATION ABLATION N/A 03/18/2019  ? Procedure: ATRIAL FIBRILLATION ABLATION;  Surgeon: Constance Haw, MD;  Location: [place] [facility];  Service: Cardiovascular;  Laterality: N/A;  ? CARDIOVERSION  05/03/2012  ? Procedure: CARDIOVERSION;  Surgeon: Laverda Page, MD;  Location: [place];  Service: Cardiovascular;  Laterality: N/A;  ? CARDIOVERSION  06/08/2012  ? Procedure: CARDIOVERSION;  Surgeon: Laverda Page, MD;  Location: [place];  Service: Cardiovascular;  Laterality: N/A;  [name]  ? CARDIOVERSION N/A 10/28/2013  ? Procedure: CARDIOVERSION;  Surgeon: Laverda Page, MD;  Location: [place];  Service: Cardiovascular;  Laterality: N/A;  ? CARDIOVERSION N/A 06/30/2014  ? Procedure: CARDIOVERSION;  Surgeon:  Laverda Page, MD;  Location: [facility] ENDOSCOPY;  Service: Cardiovascular;  Laterality: N/A;  H&P in file  ? CARDIOVERSION N/A 09/05/2014  ? Procedure: CARDIOVERSION;  Surgeon: Adrian Prows, MD;  Location: [place];  Service: Cardiovascular;  Laterality: N/A;  ? CARDIOVERSION N/A 12/24/2015  ? Procedure: CARDIOVERSION;  Surgeon: Adrian Prows, MD;  Location: [place];  Service: Cardiovascular;  Laterality: N/A;  ? CARDIOVERSION N/A 01/30/2016  ? Procedure: CARDIOVERSION;  Surgeon: Adrian Prows, MD;  Location: [place];  Service: Cardiovascular;  Laterality: N/A;  ? CARDIOVERSION N/A 02/08/2016  ? Procedure: CARDIOVERSION;  Surgeon: Adrian Prows, MD;  Location: [place];  Service: Cardiovascular;  Laterality: N/A;  ? CARDIOVERSION N/A 02/08/2019  ? Procedure: CARDIOVERSION;  Surgeon: Adrian Prows, MD;  Location: [place];  Service: Cardiovascular;  Laterality: N/A;  ? CARDIOVERSION N/A 05/24/2019  ? Procedure: CARDIOVERSION;  Surgeon: Elouise Munroe, MD;  Location: [place];  Service: Cardiovascular;  Laterality: N/A;  ? CARDIOVERSION N/A 09/21/2019  ? Procedure: CARDIOVERSION;  Surgeon: Adrian Prows, MD;  Location: [place];  Service: Cardiovascular;  Laterality: N/A;  ? CARDIOVERSION N/A 12/01/2019  ? Procedure: CARDIOVERSION;  Surgeon: Adrian Prows, MD;  Location: [place];  Service: Cardiovascular;  Laterality: N/A;  ? CARDIOVERSION N/A 08/20/2021  ? Procedure: CARDIOVERSION;  Surgeon: Adrian Prows, MD;  Location: [place];  Service: Cardiovascular;  Laterality: N/A;  ? CATARACT EXTRACTION W/ INTRAOCULAR LENS  IMPLANT, BILATERAL Bilateral   ? COLONOSCOPY    ? INGUINAL HERNIA REPAIR Bilateral   ?  as child  ? KNEE ARTHROSCOPY Right 2004  ? ?Patient Active Problem List  ? Diagnosis Date Noted  ? 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 BPH with elevated PSA 12/13/2019  ? Dermatitis 12/13/2019  ? Encounter for Medicare annual wellness exam 12/13/2019  ? Chronic pain of right knee  10/10/2019  ? Atypical atrial flutter ([place]) 05/17/2019  ? Acquired thrombophilia ([place]) 05/17/2019  ? 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/06/2018  ? Primary osteoarthritis of right knee 08/06/2018  ? Pes anserinus bursitis of left knee 10/31/2016  ? Allergic rhinitis 02/14/2016  ? Hearing loss 02/13/2016  ? CKD (chronic kidney disease) stage 3, GFR 30-59 ml/min (HCC) 02/13/2016  ? Atrial fibrillation ([place]) 02/07/2016  ? Paroxysmal atrial fibrillation ([place]) 03/31/2012  ? Hypertension 03/31/2012  ? Hemorrhoids, external 05/15/2011  ? ? ?PCP: Christain Sacramento, MD ? ?REFERRING PROVIDER: Vickey Huger, MD ? ?REFERRING DIAG: S/P R TKE (uni)  ? ?THERAPY DIAG:  ?Acute pain of right knee ? ?Muscle weakness (generalized) ? ?Stiffness of right knee, not elsewhere classified ? ?ONSET DATE:  Surgery date: 09/11/21 R Uni TKA  ? ?SUBJECTIVE:  ? ?SUBJECTIVE STATEMENT: ? ? Pt with no new complaints.  ? ? ?PERTINENT HISTORY: ?Chronic A-Fib,  ? ?PAIN:  ?Are you having pain? Yes: NPRS scale: 2/10 ?Pain location: R knee ?Pain description: achey ?Aggravating factors: increased activity  ?Relieving factors: Rest, ice  ? ? ?PRECAUTIONS: Knee ? ?WEIGHT BEARING RESTRICTIONS No ? ?FALLS:  ?Has patient fallen in last 6 months? No ? ? ?PLOF: Independent ? ?PATIENT GOALS  return to regular use of knee/leg, PLOF  ? ?OBJECTIVE:  ? ? ?COGNITION: ? Overall cognitive status: Within functional limits for tasks assessed   ?  ? ?PALPATION: ?R knee: healing incision, dressing in place. Mild swelling, wearing compression sock.  ? ? ?LE ROM:  Hip ROM: Mild limitation ? ?Passive ROM Right ?09/19/2021 R ?09/23/2021 R ?10/01/21  ?     ?     ?     ?     ?     ?     ?Knee flexion 93 112 113  ?Knee extension -3 -3   ?     ?     ?     ?     ? (Blank rows = not tested) ? ? ?LE MMT: ? Not tested  ? ? ?TODAY'S TREATMENT: ?10/03/2021 ?Therapeutic Exercise: ?Aerobic: recumbent bike L2  x 8 min;  ?Supine:   ?Seated: LAQ 2 x10 on R;  Sit to stand x 8 from arm chair-education on equal  weight bearing bil;  ?Standing: ambulation without AD  45 ft x 6  ?Stretches: Seated HSS 30 sec x 2 bil;  ?Neuromuscular Re-education: ?Manual Therapy:  PROM for R knee flex and ext;  STM and ISTM for R quad and adductor;  K-tape 2 I strips, 1 on each side of knee/incision, for improving fascial mobility at knee joint;  ?Gait Training: ?Self Care: ? ? ?PATIENT EDUCATION:  ?Education details: reviewed HEP.  ?Person educated: Patient and Spouse ?Education method: Explanation, Demonstration, Tactile cues, Verbal cues, and Handouts ?Education comprehension: verbalized understanding, returned demonstration, verbal cues required, tactile cues required, and needs further education ? ? ?HOME EXERCISE PROGRAM: ?Access Code: [id number] ? ? ?ASSESSMENT: ? ?CLINICAL IMPRESSION: ?Pt progressing well. Doing very well with ambulation, without AD. Discussed using cane for longer or outdoor distances. Discussed starting outdoor ambulation, for short distances as able, and not over  doing activity. He also did well on stairs, reciprocal today, will practice 1/2 flight at home, with hand rail.  ? ?OBJECTIVE IMPAIRMENTS Abnormal gait, decreased activity tolerance, decreased balance, decreased endurance, decreased knowledge of use of DME, decreased mobility, difficulty walking, decreased ROM, decreased strength, hypomobility, increased muscle spasms, impaired flexibility, and pain.  ? ?ACTIVITY LIMITATIONS cleaning, community activity, driving, meal prep, yard work, and shopping.  ? ?PERSONAL FACTORS  none  are also affecting patient's functional outcome.  ? ? ?REHAB POTENTIAL: Good ? ?CLINICAL DECISION MAKING: Stable/uncomplicated ? ?EVALUATION COMPLEXITY: Low ? ? ?GOALS: ?Goals reviewed with patient? Yes ? ?SHORT TERM GOALS: Target date: 10/03/2021 ? ?Pt to be independent with initial HEP ?Goal status: INITIAL ? ?2.  Pt to demo improved knee flexion by at least 15 degrees  ?Goal status: INITIAL ? ?3.  Pt to report pain levels 0-3/10 with  activity.  ?Goal status: INITIAL ? ? ? ?LONG TERM GOALS: Target date: 11/14/2021 ? ?Pt to be independent with final HEP ?Goal status: INITIAL ? ?2.  Pt to report decreased pain in R knee to 0-2/10 with activit

## 2021-10-07 NOTE — Telephone Encounter (Signed)
ICD-10-CM   ?1. Paroxysmal atrial fibrillation (HCC)  I48.0 amiodarone (PACERONE) 200 MG tablet  ?  Basic metabolic panel  ?  ? ?Cardioversion scheduled for 7:30 AM on 07/21/2021. ?

## 2021-10-07 NOTE — Addendum Note (Signed)
Addended by: Delrae Rend on: 10/07/2021 01:12 PM ? ? Modules accepted: Orders ? ?

## 2021-10-07 NOTE — Telephone Encounter (Signed)
From patient.

## 2021-10-08 ENCOUNTER — Encounter: Payer: Self-pay | Admitting: Physical Therapy

## 2021-10-08 ENCOUNTER — Ambulatory Visit (INDEPENDENT_AMBULATORY_CARE_PROVIDER_SITE_OTHER): Payer: Medicare Other | Admitting: Physical Therapy

## 2021-10-08 DIAGNOSIS — M6281 Muscle weakness (generalized): Secondary | ICD-10-CM | POA: Diagnosis not present

## 2021-10-08 DIAGNOSIS — M25661 Stiffness of right knee, not elsewhere classified: Secondary | ICD-10-CM | POA: Diagnosis not present

## 2021-10-08 DIAGNOSIS — M25561 Pain in right knee: Secondary | ICD-10-CM

## 2021-10-08 NOTE — Therapy (Signed)
?OUTPATIENT PHYSICAL THERAPY TREATMENT ? ? ?Patient Name: Gregory [name] ?MRN: [id number] ?DOB:[birth year], 81 y.o., male ?Today's Date: 10/08/2021 ? ? ? PT End of Session - 10/08/21 1029   ? ? Visit Number 7   ? Number of Visits 16   ? Date for PT Re-Evaluation 11/14/21   ? Authorization Type Medicare   ? PT Start Time 1020   ? PT Stop Time 1100   ? PT Time Calculation (min) 40 min   ? Activity Tolerance Patient tolerated treatment well   ? Behavior During Therapy [facility] for tasks assessed/performed   ? ?  ?  ? ?  ? ? ? ? ?Past Medical History:  ?Diagnosis Date  ? Arthritis   ? "right knee" (01/28/2016)  ? Diverticulosis of colon (without mention of hemorrhage)   ? Dysrhythmia   ? Hyperlipidemia   ? Internal hemorrhoids without mention of complication   ? Personal history of colonic polyps 01/17/2002  ? adenomatous, tublar adenoma 02/10/2007  ? PNA (pneumonia) 03/31/2012  ? ?Past Surgical History:  ?Procedure Laterality Date  ? ATRIAL FIBRILLATION ABLATION  11/28/2016  ? ATRIAL FIBRILLATION ABLATION N/A 11/28/2016  ? Procedure: Atrial Fibrillation Ablation;  Surgeon: Regan Lemming, MD;  Location: [facility] INVASIVE CV LAB;  Service: Cardiovascular;  Laterality: N/A;  ? ATRIAL FIBRILLATION ABLATION N/A 03/18/2019  ? Procedure: ATRIAL FIBRILLATION ABLATION;  Surgeon: Regan Lemming, MD;  Location: [facility] INVASIVE CV LAB;  Service: Cardiovascular;  Laterality: N/A;  ? CARDIOVERSION  05/03/2012  ? Procedure: CARDIOVERSION;  Surgeon: [name], MD;  Location: [facility] ENDOSCOPY;  Service: Cardiovascular;  Laterality: N/A;  ? CARDIOVERSION  06/08/2012  ? Procedure: CARDIOVERSION;  Surgeon: [name], MD;  Location: [facility] ENDOSCOPY;  Service: Cardiovascular;  Laterality: N/A;  Lawrence Marseilles Wille Celeste  ? CARDIOVERSION N/A 10/28/2013  ? Procedure: CARDIOVERSION;  Surgeon: [name], MD;  Location: [facility] ENDOSCOPY;  Service: Cardiovascular;  Laterality: N/A;  ? CARDIOVERSION N/A 06/30/2014  ? Procedure: CARDIOVERSION;  Surgeon:  [name], MD;  Location: [facility] ENDOSCOPY;  Service: Cardiovascular;  Laterality: N/A;  H&P in file  ? CARDIOVERSION N/A 09/05/2014  ? Procedure: CARDIOVERSION;  Surgeon: [name], MD;  Location: [facility] ENDOSCOPY;  Service: Cardiovascular;  Laterality: N/A;  ? CARDIOVERSION N/A 12/24/2015  ? Procedure: CARDIOVERSION;  Surgeon: [name], MD;  Location: [facility] ENDOSCOPY;  Service: Cardiovascular;  Laterality: N/A;  ? CARDIOVERSION N/A 01/30/2016  ? Procedure: CARDIOVERSION;  Surgeon: [name], MD;  Location: [facility] ENDOSCOPY;  Service: Cardiovascular;  Laterality: N/A;  ? CARDIOVERSION N/A 02/08/2016  ? Procedure: CARDIOVERSION;  Surgeon: [name], MD;  Location: [facility] ENDOSCOPY;  Service: Cardiovascular;  Laterality: N/A;  ? CARDIOVERSION N/A 02/08/2019  ? Procedure: CARDIOVERSION;  Surgeon: [name], MD;  Location: [facility] ENDOSCOPY;  Service: Cardiovascular;  Laterality: N/A;  ? CARDIOVERSION N/A 05/24/2019  ? Procedure: CARDIOVERSION;  Surgeon: Parke Poisson, MD;  Location: [facility] ENDOSCOPY;  Service: Cardiovascular;  Laterality: N/A;  ? CARDIOVERSION N/A 09/21/2019  ? Procedure: CARDIOVERSION;  Surgeon: [name], MD;  Location: [facility] ENDOSCOPY;  Service: Cardiovascular;  Laterality: N/A;  ? CARDIOVERSION N/A 12/01/2019  ? Procedure: CARDIOVERSION;  Surgeon: [name], MD;  Location: [facility] ENDOSCOPY;  Service: Cardiovascular;  Laterality: N/A;  ? CARDIOVERSION N/A 08/20/2021  ? Procedure: CARDIOVERSION;  Surgeon: [name], MD;  Location: [facility] ENDOSCOPY;  Service: Cardiovascular;  Laterality: N/A;  ? CATARACT EXTRACTION W/ INTRAOCULAR LENS  IMPLANT, BILATERAL Bilateral   ? COLONOSCOPY    ? INGUINAL HERNIA REPAIR  Bilateral   ? as child  ? KNEE ARTHROSCOPY Right 2004  ? ?Patient Active Problem List  ? Diagnosis Date Noted  ? 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 BPH with elevated PSA 12/13/2019  ? Dermatitis 12/13/2019  ? Encounter for Medicare annual wellness exam 12/13/2019  ? Chronic pain of right knee  10/10/2019  ? Atypical atrial flutter ([place]) 05/17/2019  ? Acquired thrombophilia ([place]) 05/17/2019  ? 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/06/2018  ? Primary osteoarthritis of right knee 08/06/2018  ? Pes anserinus bursitis of left knee 10/31/2016  ? Allergic rhinitis 02/14/2016  ? Hearing loss 02/13/2016  ? CKD (chronic kidney disease) stage 3, GFR 30-59 ml/min (HCC) 02/13/2016  ? Atrial fibrillation ([place]) 02/07/2016  ? Paroxysmal atrial fibrillation ([place]) 03/31/2012  ? Hypertension 03/31/2012  ? Hemorrhoids, external 05/15/2011  ? ? ?PCP: Christain Sacramento, MD ? ?REFERRING PROVIDER: Vickey Huger, MD ? ?REFERRING DIAG: S/P R TKE (uni)  ? ?THERAPY DIAG:  ?Acute pain of right knee ? ?Muscle weakness (generalized) ? ?Stiffness of right knee, not elsewhere classified ? ?ONSET DATE:  Surgery date: 09/11/21 R Uni TKA  ? ?SUBJECTIVE:  ? ?SUBJECTIVE STATEMENT: ? Pt states some increased pain after increased activity, but pain rating only 2/10. He has been doing more activity, most of his regular activities.  ?  ? ?PERTINENT HISTORY: ?Chronic A-Fib,  ? ?PAIN:  ?Are you having pain? Yes: NPRS scale: 2/10 ?Pain location: R knee ?Pain description: achey ?Aggravating factors: increased activity  ?Relieving factors: Rest, ice  ? ? ?PRECAUTIONS: Knee ? ?WEIGHT BEARING RESTRICTIONS No ? ?FALLS:  ?Has patient fallen in last 6 months? No ? ? ?PLOF: Independent ? ?PATIENT GOALS  return to regular use of knee/leg, PLOF  ? ?OBJECTIVE:  ? ? ?COGNITION: ? Overall cognitive status: Within functional limits for tasks assessed   ?  ? ?PALPATION: ?R knee: healing incision, dressing in place. Mild swelling, wearing compression sock.  ? ? ?LE ROM:  Hip ROM: Mild limitation ? ?Passive ROM Right ?09/19/2021 R ?09/23/2021 R ?10/01/21  ?     ?     ?     ?     ?     ?     ?Knee flexion 93 112 113  ?Knee extension -3 -3   ?     ?     ?     ?     ? (Blank rows = not tested) ? ? ?LE MMT: ? Not tested  ? ? ?TODAY'S TREATMENT: ?10/08/2021 ?Therapeutic Exercise: ?Aerobic:  recumbent bike L2  x 8 min;  ?Supine:  Heel slides/Strap x 20 on R:  ?Seated: LAQ 2 x10 on R;  HSC GTB x 20 on R:  ?Standing:  [place] x 20 on R; ambulation without AD  45 ft x 6  ?Stretches: Seated HSS 30 sec x 2 bil; Gastroc stretch at counter 30 sec x 3 on R; Lateral weight shifts standing on rise laterally - for inc rearfoot EV.  ?Ambulation200 ft, quick pace, no AD. ?Neuromuscular Re-education: ?Manual Therapy:  PROM for R knee flex and ext;   ?Gait Training: ? ? ? ?PATIENT EDUCATION:  ?Education details: reviewed HEP.  ?Person educated: Patient and Spouse ?Education method: Explanation, Demonstration, Tactile cues, Verbal cues, and Handouts ?Education comprehension: verbalized understanding, returned demonstration, verbal cues required, tactile cues required, and needs further education ? ? ?HOME EXERCISE PROGRAM: ?Access Code: [id number] ?Updated to include gastroc stretch.  ? ? ?  ASSESSMENT: ? ?CLINICAL IMPRESSION: ?Discussed short duration activity, for standing activity, if he is getting increased pain with prolonged standing and walking.  ?Rom up to 120 for flexion after manual and Ther ex today. Pt progressing well. Safe for walking without AD for majority of time. He does have noted rearfoot stiffness and decreased EV, foot sitting in more INV position, education on some ankle mobility today, will continue to work on this as well.  ? ?OBJECTIVE IMPAIRMENTS Abnormal gait, decreased activity tolerance, decreased balance, decreased endurance, decreased knowledge of use of DME, decreased mobility, difficulty walking, decreased ROM, decreased strength, hypomobility, increased muscle spasms, impaired flexibility, and pain.  ? ?ACTIVITY LIMITATIONS cleaning, community activity, driving, meal prep, yard work, and shopping.  ? ?PERSONAL FACTORS  none  are also affecting patient's functional outcome.  ? ? ?REHAB POTENTIAL: Good ? ?CLINICAL DECISION MAKING: Stable/uncomplicated ? ?EVALUATION COMPLEXITY:  Low ? ? ?GOALS: ?Goals reviewed with patient? Yes ? ?SHORT TERM GOALS: Target date: 10/03/2021 ? ?Pt to be independent with initial HEP ?Goal status: INITIAL ? ?2.  Pt to demo improved knee flexion by at least 15 degrees

## 2021-10-09 LAB — BASIC METABOLIC PANEL
BUN/Creatinine Ratio: 13 (ref 10–24)
BUN: 25 mg/dL (ref 8–27)
CO2: 19 mmol/L — ABNORMAL LOW (ref 20–29)
Calcium: 9 mg/dL (ref 8.6–10.2)
Chloride: 105 mmol/L (ref 96–106)
Creatinine, Ser: 1.87 mg/dL — ABNORMAL HIGH (ref 0.76–1.27)
Glucose: 108 mg/dL — ABNORMAL HIGH (ref 70–99)
Potassium: 4.8 mmol/L (ref 3.5–5.2)
Sodium: 142 mmol/L (ref 134–144)
eGFR: 36 mL/min/{1.73_m2} — ABNORMAL LOW (ref >59)

## 2021-10-10 ENCOUNTER — Encounter: Payer: Self-pay | Admitting: Physical Therapy

## 2021-10-10 ENCOUNTER — Ambulatory Visit (INDEPENDENT_AMBULATORY_CARE_PROVIDER_SITE_OTHER): Payer: Medicare Other | Admitting: Physical Therapy

## 2021-10-10 DIAGNOSIS — M25561 Pain in right knee: Secondary | ICD-10-CM | POA: Diagnosis not present

## 2021-10-10 DIAGNOSIS — M25661 Stiffness of right knee, not elsewhere classified: Secondary | ICD-10-CM

## 2021-10-10 DIAGNOSIS — M6281 Muscle weakness (generalized): Secondary | ICD-10-CM | POA: Diagnosis not present

## 2021-10-10 NOTE — Therapy (Signed)
?OUTPATIENT PHYSICAL THERAPY TREATMENT ? ? ?Patient Name: DAVIYON [name] ?MRN: [id number] ?DOB:June 06, 1941, 81 y.o., male ?Today's Date: 10/10/2021 ? ? ? PT End of Session - 10/10/21 1024   ? ? Visit Number 8   ? Number of Visits 16   ? Date for PT Re-Evaluation 11/14/21   ? Authorization Type Medicare   ? PT Start Time 1023   ? PT Stop Time 1101   ? PT Time Calculation (min) 38 min   ? Activity Tolerance Patient tolerated treatment well   ? Behavior During Therapy [facility] for tasks assessed/performed   ? ?  ?  ? ?  ? ? ? ? ?Past Medical History:  ?Diagnosis Date  ? Arthritis   ? "right knee" (01/28/2016)  ? Diverticulosis of colon (without mention of hemorrhage)   ? Dysrhythmia   ? Hyperlipidemia   ? Internal hemorrhoids without mention of complication   ? Personal history of colonic polyps 01/17/2002  ? adenomatous, tublar adenoma 02/10/2007  ? PNA (pneumonia) 03/31/2012  ? ?Past Surgical History:  ?Procedure Laterality Date  ? ATRIAL FIBRILLATION ABLATION  11/28/2016  ? ATRIAL FIBRILLATION ABLATION N/A 11/28/2016  ? Procedure: Atrial Fibrillation Ablation;  Surgeon: Constance Haw, MD;  Location: [place] CV LAB;  Service: Cardiovascular;  Laterality: N/A;  ? ATRIAL FIBRILLATION ABLATION N/A 03/18/2019  ? Procedure: ATRIAL FIBRILLATION ABLATION;  Surgeon: Constance Haw, MD;  Location: [place] CV LAB;  Service: Cardiovascular;  Laterality: N/A;  ? CARDIOVERSION  05/03/2012  ? Procedure: CARDIOVERSION;  Surgeon: Laverda Page, MD;  Location: [place];  Service: Cardiovascular;  Laterality: N/A;  ? CARDIOVERSION  06/08/2012  ? Procedure: CARDIOVERSION;  Surgeon: Laverda Page, MD;  Location: [place];  Service: Cardiovascular;  Laterality: N/A;  [name]  ? CARDIOVERSION N/A 10/28/2013  ? Procedure: CARDIOVERSION;  Surgeon: Laverda Page, MD;  Location: [place];  Service: Cardiovascular;  Laterality: N/A;  ? CARDIOVERSION N/A 06/30/2014  ? Procedure: CARDIOVERSION;  Surgeon:  Laverda Page, MD;  Location: [facility] ENDOSCOPY;  Service: Cardiovascular;  Laterality: N/A;  H&P in file  ? CARDIOVERSION N/A 09/05/2014  ? Procedure: CARDIOVERSION;  Surgeon: Adrian Prows, MD;  Location: [place];  Service: Cardiovascular;  Laterality: N/A;  ? CARDIOVERSION N/A 12/24/2015  ? Procedure: CARDIOVERSION;  Surgeon: Adrian Prows, MD;  Location: [place];  Service: Cardiovascular;  Laterality: N/A;  ? CARDIOVERSION N/A 01/30/2016  ? Procedure: CARDIOVERSION;  Surgeon: Adrian Prows, MD;  Location: [place];  Service: Cardiovascular;  Laterality: N/A;  ? CARDIOVERSION N/A 02/08/2016  ? Procedure: CARDIOVERSION;  Surgeon: Adrian Prows, MD;  Location: [place];  Service: Cardiovascular;  Laterality: N/A;  ? CARDIOVERSION N/A 02/08/2019  ? Procedure: CARDIOVERSION;  Surgeon: Adrian Prows, MD;  Location: [place];  Service: Cardiovascular;  Laterality: N/A;  ? CARDIOVERSION N/A 05/24/2019  ? Procedure: CARDIOVERSION;  Surgeon: Elouise Munroe, MD;  Location: [place];  Service: Cardiovascular;  Laterality: N/A;  ? CARDIOVERSION N/A 09/21/2019  ? Procedure: CARDIOVERSION;  Surgeon: Adrian Prows, MD;  Location: [place];  Service: Cardiovascular;  Laterality: N/A;  ? CARDIOVERSION N/A 12/01/2019  ? Procedure: CARDIOVERSION;  Surgeon: Adrian Prows, MD;  Location: [place];  Service: Cardiovascular;  Laterality: N/A;  ? CARDIOVERSION N/A 08/20/2021  ? Procedure: CARDIOVERSION;  Surgeon: Adrian Prows, MD;  Location: [place];  Service: Cardiovascular;  Laterality: N/A;  ? CATARACT EXTRACTION W/ INTRAOCULAR LENS  IMPLANT, BILATERAL Bilateral   ? COLONOSCOPY    ? INGUINAL HERNIA REPAIR  Bilateral   ? as child  ? KNEE ARTHROSCOPY Right 2004  ? ?Patient Active Problem List  ? Diagnosis Date Noted  ? 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 BPH with elevated PSA 12/13/2019  ? Dermatitis 12/13/2019  ? Encounter for Medicare annual wellness exam 12/13/2019  ? Chronic pain of right knee  10/10/2019  ? Atypical atrial flutter ([place]) 05/17/2019  ? Acquired thrombophilia ([place]) 05/17/2019  ? 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/06/2018  ? Primary osteoarthritis of right knee 08/06/2018  ? Pes anserinus bursitis of left knee 10/31/2016  ? Allergic rhinitis 02/14/2016  ? Hearing loss 02/13/2016  ? CKD (chronic kidney disease) stage 3, GFR 30-59 ml/min (HCC) 02/13/2016  ? Atrial fibrillation ([place]) 02/07/2016  ? Paroxysmal atrial fibrillation ([place]) 03/31/2012  ? Hypertension 03/31/2012  ? Hemorrhoids, external 05/15/2011  ? ? ?PCP: Christain [place], MD ? ?REFERRING PROVIDER: Vickey Huger, MD ? ?REFERRING DIAG: S/P R TKE (uni)  ? ?THERAPY DIAG:  ?Acute pain of right knee ? ?Muscle weakness (generalized) ? ?Stiffness of right knee, not elsewhere classified ? ?ONSET DATE:  Surgery date: 09/11/21 R Uni TKA  ? ?SUBJECTIVE:  ? ?SUBJECTIVE STATEMENT: ? Pt states doing well. Having cardioversion next week. Will only be able to attend 1X.  ?  ? ?PERTINENT HISTORY: ?Chronic A-Fib,  ? ?PAIN:  ?Are you having pain? Yes: NPRS scale: 2/10 ?Pain location: R knee ?Pain description: achey ?Aggravating factors: increased activity  ?Relieving factors: Rest, ice  ? ? ?PRECAUTIONS: Knee ? ?WEIGHT BEARING RESTRICTIONS No ? ?FALLS:  ?Has patient fallen in last 6 months? No ? ? ?PLOF: Independent ? ?PATIENT GOALS  return to regular use of knee/leg, PLOF  ? ?OBJECTIVE:  ? ? ?COGNITION: ? Overall cognitive status: Within functional limits for tasks assessed   ?  ? ?PALPATION: ?R knee: healing incision, dressing in place. Mild swelling, wearing compression sock.  ? ? ?LE ROM:  Hip ROM: Mild limitation ? ?Passive ROM Right ?09/19/2021 R ?09/23/2021 R ?10/01/21  ?     ?     ?     ?     ?     ?     ?Knee flexion 93 112 113  ?Knee extension -3 -3   ?     ?     ?     ?     ? (Blank rows = not tested) ? ? ?LE MMT: ? Not tested  ? ? ?TODAY'S TREATMENT: ? ?10/10/2021 ?Therapeutic Exercise: ?Aerobic: recumbent bike L1 x 7 min;  ?Supine:  Heel slides/Strap x 20  on R:  ?Seated: LAQ 2 x10 on R;   ?Standing:  [place] x 20 on R; [place], slow x 20, HR x 15; Step ups fwd 6 in x 10 on R; Stairs 5 steps recipricol, no UE support x 5;  ?Stretches: Seated HSS 30 sec x 2 bil;   ?Neuromuscular Re-education: ?Manual Therapy:  PROM for R knee flex and ext;   ?Gait Training: ? ? ? ?PATIENT EDUCATION:  ?Education details: reviewed HEP.  ?Person educated: Patient and Spouse ?Education method: Explanation, Demonstration, Tactile cues, Verbal cues, and Handouts ?Education comprehension: verbalized understanding, returned demonstration, verbal cues required, tactile cues required, and needs further education ? ? ?HOME EXERCISE PROGRAM: ?Access Code: [id number] ?Updated to include gastroc stretch.  ? ? ?ASSESSMENT: ? ?CLINICAL IMPRESSION: ?Pt progressing well with ROM, strength, gait and function. He has mild swelling, discussed continuing to ice and use compresssion  sock if needed. Plan to progress strengthening as tolerated.  ? ?OBJECTIVE IMPAIRMENTS Abnormal gait, decreased activity tolerance, decreased balance, decreased endurance, decreased knowledge of use of DME, decreased mobility, difficulty walking, decreased ROM, decreased strength, hypomobility, increased muscle spasms, impaired flexibility, and pain.  ? ?ACTIVITY LIMITATIONS cleaning, community activity, driving, meal prep, yard work, and shopping.  ? ?PERSONAL FACTORS  none  are also affecting patient's functional outcome.  ? ? ?REHAB POTENTIAL: Good ? ?CLINICAL DECISION MAKING: Stable/uncomplicated ? ?EVALUATION COMPLEXITY: Low ? ? ?GOALS: ?Goals reviewed with patient? Yes ? ?SHORT TERM GOALS: Target date: 10/03/2021 ? ?Pt to be independent with initial HEP ?Goal status: INITIAL ? ?2.  Pt to demo improved knee flexion by at least 15 degrees  ?Goal status: INITIAL ? ?3.  Pt to report pain levels 0-3/10 with activity.  ?Goal status: INITIAL ? ? ? ?LONG TERM GOALS: Target date: 11/14/2021 ? ?Pt to be independent with final HEP ?Goal status:  INITIAL ? ?2.  Pt to report decreased pain in R knee to 0-2/10 with activity  ?Goal status: INITIAL ? ?3.  Pt to demo improved R knee ROM to be WNL for flex and extension  ?Baseline: -3 to 93 deg  ?[place]

## 2021-10-14 NOTE — Telephone Encounter (Signed)
From pt

## 2021-10-15 ENCOUNTER — Encounter (HOSPITAL_COMMUNITY): Payer: Self-pay | Admitting: Cardiology

## 2021-10-15 ENCOUNTER — Encounter: Payer: Medicare Other | Admitting: Physical Therapy

## 2021-10-17 ENCOUNTER — Ambulatory Visit (INDEPENDENT_AMBULATORY_CARE_PROVIDER_SITE_OTHER): Payer: Medicare Other | Admitting: Physical Therapy

## 2021-10-17 ENCOUNTER — Encounter: Payer: Self-pay | Admitting: Physical Therapy

## 2021-10-17 ENCOUNTER — Encounter: Payer: Self-pay | Admitting: Cardiology

## 2021-10-17 DIAGNOSIS — M25561 Pain in right knee: Secondary | ICD-10-CM

## 2021-10-17 DIAGNOSIS — M25661 Stiffness of right knee, not elsewhere classified: Secondary | ICD-10-CM

## 2021-10-17 DIAGNOSIS — M6281 Muscle weakness (generalized): Secondary | ICD-10-CM

## 2021-10-17 NOTE — Anesthesia Preprocedure Evaluation (Addendum)
Anesthesia Evaluation  ?Patient identified by MRN, date of birth, ID band ?Patient awake ? ? ? ?Reviewed: ?Allergy & Precautions, NPO status , Patient's Chart, lab work & pertinent test results ? ?Airway ?Mallampati: II ? ?TM Distance: >3 FB ?Neck ROM: Full ? ? ? Dental ?no notable dental hx. ? ?  ?Pulmonary ? ?  ?Pulmonary exam normal ?breath sounds clear to auscultation ? ? ? ? ? ? Cardiovascular ?Exercise Tolerance: Good ?hypertension, Pt. on medications ?Normal cardiovascular exam+ dysrhythmias Atrial Fibrillation  ?Rhythm:Regular Rate:Normal ? ? ?  ?Neuro/Psych ? Neuromuscular disease   ? GI/Hepatic ?negative GI ROS, Neg liver ROS,   ?Endo/Other  ?hyperlipidemia ? Renal/GU ?Renal Insufficiency and CRFRenal disease  ? ?  ?Musculoskeletal ? ?(+) Arthritis , Osteoarthritis,   ? Abdominal ?  ?Peds ?negative pediatric ROS ?(+)  Hematology ?negative hematology ROS ?(+)   ?Anesthesia Other Findings ? ? Reproductive/Obstetrics ? ?  ? ? ? ? ? ? ? ? ? ? ? ? ? ?  ?  ? ? ? ? ? ? ? ?Anesthesia Physical ?Anesthesia Plan ? ?ASA: 2 ? ?Anesthesia Plan: General  ? ?Post-op Pain Management:   ? ?Induction: Intravenous ? ?PONV Risk Score and Plan: 2 ? ?Airway Management Planned: Mask ? ?Additional Equipment:  ? ?Intra-op Plan:  ? ?Post-operative Plan: Extubation in OR ? ?Informed Consent: I have reviewed the patients History and Physical, chart, labs and discussed the procedure including the risks, benefits and alternatives for the proposed anesthesia with the patient or authorized representative who has indicated his/her understanding and acceptance.  ? ? ? ?Dental advisory given ? ?Plan Discussed with: Anesthesiologist and CRNA ? ?Anesthesia Plan Comments:   ? ? ? ? ? ?Anesthesia Quick Evaluation ? ?

## 2021-10-17 NOTE — Therapy (Signed)
?OUTPATIENT PHYSICAL THERAPY TREATMENT ? ? ?Patient Name: Gregory [name] ?MRN: [id number] ?DOB:05/03/1941, 81 y.o., male ?Today's Date: 10/17/2021 ? ? ? PT End of Session - 10/17/21 1231   ? ? Visit Number 9   ? Number of Visits 16   ? Date for PT Re-Evaluation 11/14/21   ? Authorization Type Medicare   ? PT Start Time 1020   ? PT Stop Time 1100   ? PT Time Calculation (min) 40 min   ? Activity Tolerance Patient tolerated treatment well   ? Behavior During Therapy [facility] for tasks assessed/performed   ? ?  ?  ? ?  ? ? ? ? ? ?Past Medical History:  ?Diagnosis Date  ? Arthritis   ? "right knee" (01/28/2016)  ? Diverticulosis of colon (without mention of hemorrhage)   ? Dysrhythmia   ? Hyperlipidemia   ? Internal hemorrhoids without mention of complication   ? Personal history of colonic polyps 01/17/2002  ? adenomatous, tublar adenoma 02/10/2007  ? PNA (pneumonia) 03/31/2012  ? ?Past Surgical History:  ?Procedure Laterality Date  ? ATRIAL FIBRILLATION ABLATION  11/28/2016  ? ATRIAL FIBRILLATION ABLATION N/A 11/28/2016  ? Procedure: Atrial Fibrillation Ablation;  Surgeon: Constance Haw, MD;  Location: [place] [facility];  Service: Cardiovascular;  Laterality: N/A;  ? ATRIAL FIBRILLATION ABLATION N/A 03/18/2019  ? Procedure: ATRIAL FIBRILLATION ABLATION;  Surgeon: Constance Haw, MD;  Location: [place] [facility];  Service: Cardiovascular;  Laterality: N/A;  ? CARDIOVERSION  05/03/2012  ? Procedure: CARDIOVERSION;  Surgeon: Laverda Page, MD;  Location: [place];  Service: Cardiovascular;  Laterality: N/A;  ? CARDIOVERSION  06/08/2012  ? Procedure: CARDIOVERSION;  Surgeon: Laverda Page, MD;  Location: [place];  Service: Cardiovascular;  Laterality: N/A;  [name]  ? CARDIOVERSION N/A 10/28/2013  ? Procedure: CARDIOVERSION;  Surgeon: Laverda Page, MD;  Location: [place];  Service: Cardiovascular;  Laterality: N/A;  ? CARDIOVERSION N/A 06/30/2014  ? Procedure: CARDIOVERSION;  Surgeon:  Laverda Page, MD;  Location: [facility] ENDOSCOPY;  Service: Cardiovascular;  Laterality: N/A;  H&P in file  ? CARDIOVERSION N/A 09/05/2014  ? Procedure: CARDIOVERSION;  Surgeon: Adrian Prows, MD;  Location: [place];  Service: Cardiovascular;  Laterality: N/A;  ? CARDIOVERSION N/A 12/24/2015  ? Procedure: CARDIOVERSION;  Surgeon: Adrian Prows, MD;  Location: [place];  Service: Cardiovascular;  Laterality: N/A;  ? CARDIOVERSION N/A 01/30/2016  ? Procedure: CARDIOVERSION;  Surgeon: Adrian Prows, MD;  Location: [place];  Service: Cardiovascular;  Laterality: N/A;  ? CARDIOVERSION N/A 02/08/2016  ? Procedure: CARDIOVERSION;  Surgeon: Adrian Prows, MD;  Location: [place];  Service: Cardiovascular;  Laterality: N/A;  ? CARDIOVERSION N/A 02/08/2019  ? Procedure: CARDIOVERSION;  Surgeon: Adrian Prows, MD;  Location: [place];  Service: Cardiovascular;  Laterality: N/A;  ? CARDIOVERSION N/A 05/24/2019  ? Procedure: CARDIOVERSION;  Surgeon: Elouise Munroe, MD;  Location: [place];  Service: Cardiovascular;  Laterality: N/A;  ? CARDIOVERSION N/A 09/21/2019  ? Procedure: CARDIOVERSION;  Surgeon: Adrian Prows, MD;  Location: [place];  Service: Cardiovascular;  Laterality: N/A;  ? CARDIOVERSION N/A 12/01/2019  ? Procedure: CARDIOVERSION;  Surgeon: Adrian Prows, MD;  Location: [place];  Service: Cardiovascular;  Laterality: N/A;  ? CARDIOVERSION N/A 08/20/2021  ? Procedure: CARDIOVERSION;  Surgeon: Adrian Prows, MD;  Location: [place];  Service: Cardiovascular;  Laterality: N/A;  ? CATARACT EXTRACTION W/ INTRAOCULAR LENS  IMPLANT, BILATERAL Bilateral   ? COLONOSCOPY    ? INGUINAL HERNIA  REPAIR Bilateral   ? as child  ? KNEE ARTHROSCOPY Right 2004  ? ?Patient Active Problem List  ? Diagnosis Date Noted  ? 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 BPH with elevated PSA 12/13/2019  ? Dermatitis 12/13/2019  ? Encounter for Medicare annual wellness exam 12/13/2019  ? Chronic pain of right knee  10/10/2019  ? Atypical atrial flutter (HCC) 05/17/2019  ? Acquired thrombophilia (HCC) 05/17/2019  ? 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/06/2018  ? Primary osteoarthritis of right knee 08/06/2018  ? Pes anserinus bursitis of left knee 10/31/2016  ? Allergic rhinitis 02/14/2016  ? Hearing loss 02/13/2016  ? CKD (chronic kidney disease) stage 3, GFR 30-59 ml/min (HCC) 02/13/2016  ? Atrial fibrillation (HCC) 02/07/2016  ? Paroxysmal atrial fibrillation (HCC) 03/31/2012  ? Hypertension 03/31/2012  ? Hemorrhoids, external 05/15/2011  ? ? ?PCP: Barbie BannerWilson, Fred H, MD ? ?REFERRING PROVIDER: Dannielle HuhLucey, Steve, MD ? ?REFERRING DIAG: S/P R TKE (uni)  ? ?THERAPY DIAG:  ?Acute pain of right knee ? ?Stiffness of right knee, not elsewhere classified ? ?Muscle weakness (generalized) ? ?ONSET DATE:  Surgery date: 09/11/21 R Uni TKA  ? ?SUBJECTIVE:  ? ?SUBJECTIVE STATEMENT: ? Pt states doing well. Having cardioversion tomorrow. States mild soreness in knee, with activity .  ?  ? ?PERTINENT HISTORY: ?Chronic A-Fib,  ? ?PAIN:  ?Are you having pain? Yes: NPRS scale: 2/10 ?Pain location: R knee ?Pain description: achey ?Aggravating factors: increased activity  ?Relieving factors: Rest, ice  ? ? ?PRECAUTIONS: Knee ? ?WEIGHT BEARING RESTRICTIONS No ? ?FALLS:  ?Has patient fallen in last 6 months? No ? ? ?PLOF: Independent ? ?PATIENT GOALS  return to regular use of knee/leg, PLOF  ? ?OBJECTIVE:  ? ? ?COGNITION: ? Overall cognitive status: Within functional limits for tasks assessed   ?  ? ?PALPATION: ?R knee: healing incision, dressing in place. Mild swelling, wearing compression sock.  ? ? ?LE ROM:  Hip ROM: Mild limitation ? ?Passive ROM Right ?09/19/21 R ?09/23/21 R ?10/01/21 R ?10/17/21  ?      ?      ?      ?      ?      ?      ?Knee flexion 93 112 113 110  ?Knee extension -3 -3    ?      ?      ?      ?      ? (Blank rows = not tested) ? ? ?LE MMT: ? Not tested  ? ? ?TODAY'S TREATMENT: ? ?10/17/2021 ?Therapeutic Exercise: ?Aerobic: recumbent bike L1 x 8 min;   ?Supine:  SKTC 20 sec x3 on R for knee flexion;  ?Prone: HSC x 10 on R ?Seated:    ?Standing:  HSC x 20 on R; March, slow x 20,  Step ups fwd 6 in x 10 on R; Stairs 5 steps recipricol, no UE support x 5;  ?Stretches: Seated HSS 30 sec x 2 bil;   ?Neuromuscular Re-education: ?Manual Therapy:  PROM for R knee flex and ext in supine, seated, and prone,  ?Gait Training: ? ? ? ?PATIENT EDUCATION:  ?Education details: reviewed HEP.  ?Person educated: Patient and Spouse ?Education method: Explanation, Demonstration, Tactile cues, Verbal cues, and Handouts ?Education comprehension: verbalized understanding, returned demonstration, verbal cues required, tactile cues required, and needs further education ? ? ?HOME EXERCISE PROGRAM: ?Access Code: [email or web address] ?Updated 10/17/21 ? ? ?ASSESSMENT: ? ?CLINICAL IMPRESSION: ?Pt with  mild stiffness in knee today, with slightly less flexion ROM measurement. Thought he was having cardioversion earlier in week, so he only attended 1x this week. Will continue at 2x next week. Doing well functionally, with gait, stairs, and standing activity. Will continue to focus on ROM and strength.  ? ?OBJECTIVE IMPAIRMENTS Abnormal gait, decreased activity tolerance, decreased balance, decreased endurance, decreased knowledge of use of DME, decreased mobility, difficulty walking, decreased ROM, decreased strength, hypomobility, increased muscle spasms, impaired flexibility, and pain.  ? ?ACTIVITY LIMITATIONS cleaning, community activity, driving, meal prep, yard work, and shopping.  ? ?PERSONAL FACTORS  none  are also affecting patient's functional outcome.  ? ? ?REHAB POTENTIAL: Good ? ?CLINICAL DECISION MAKING: Stable/uncomplicated ? ?EVALUATION COMPLEXITY: Low ? ? ?GOALS: ?Goals reviewed with patient? Yes ? ?SHORT TERM GOALS: Target date: 10/03/2021 ? ?Pt to be independent with initial HEP ?Goal status: INITIAL ? ?2.  Pt to demo improved knee flexion by at least 15 degrees  ?Goal status: INITIAL ? ?3.   Pt to report pain levels 0-3/10 with activity.  ?Goal status: INITIAL ? ? ? ?LONG TERM GOALS: Target date: 11/14/2021 ? ?Pt to be independent with final HEP ?Goal status: INITIAL ? ?2.  Pt to report decre

## 2021-10-17 NOTE — Telephone Encounter (Signed)
From patient.

## 2021-10-18 ENCOUNTER — Ambulatory Visit (HOSPITAL_COMMUNITY)
Admission: RE | Admit: 2021-10-18 | Discharge: 2021-10-18 | Disposition: A | Discharge status: Home / Self Care | Payer: Medicare Other | Source: Ambulatory Visit | Attending: Cardiology | Admitting: Cardiology

## 2021-10-18 ENCOUNTER — Other Ambulatory Visit: Payer: Self-pay

## 2021-10-18 ENCOUNTER — Encounter (HOSPITAL_COMMUNITY)
Admission: RE | Disposition: A | Discharge status: Home / Self Care | Payer: Self-pay | Source: Ambulatory Visit | Attending: Cardiology

## 2021-10-18 ENCOUNTER — Ambulatory Visit (HOSPITAL_BASED_OUTPATIENT_CLINIC_OR_DEPARTMENT_OTHER): Payer: Medicare Other | Admitting: Anesthesiology

## 2021-10-18 ENCOUNTER — Ambulatory Visit (HOSPITAL_COMMUNITY): Payer: Medicare Other | Admitting: Anesthesiology

## 2021-10-18 ENCOUNTER — Encounter (HOSPITAL_COMMUNITY): Payer: Self-pay | Admitting: Cardiology

## 2021-10-18 DIAGNOSIS — E785 Hyperlipidemia, unspecified: Secondary | ICD-10-CM | POA: Diagnosis not present

## 2021-10-18 DIAGNOSIS — N1832 Chronic kidney disease, stage 3b: Secondary | ICD-10-CM | POA: Insufficient documentation

## 2021-10-18 DIAGNOSIS — Z7901 Long term (current) use of anticoagulants: Secondary | ICD-10-CM | POA: Diagnosis not present

## 2021-10-18 DIAGNOSIS — I480 Paroxysmal atrial fibrillation: Secondary | ICD-10-CM | POA: Diagnosis present

## 2021-10-18 DIAGNOSIS — I4891 Unspecified atrial fibrillation: Secondary | ICD-10-CM

## 2021-10-18 DIAGNOSIS — I129 Hypertensive chronic kidney disease with stage 1 through stage 4 chronic kidney disease, or unspecified chronic kidney disease: Secondary | ICD-10-CM | POA: Diagnosis not present

## 2021-10-18 DIAGNOSIS — I2510 Atherosclerotic heart disease of native coronary artery without angina pectoris: Secondary | ICD-10-CM | POA: Diagnosis not present

## 2021-10-18 HISTORY — PX: CARDIOVERSION: SHX1299

## 2021-10-18 SURGERY — CARDIOVERSION
Anesthesia: General

## 2021-10-18 MED ORDER — LIDOCAINE 2% (20 MG/ML) 5 ML SYRINGE
INTRAMUSCULAR | Status: DC | PRN
  Administered 2021-10-18: 80 mg via INTRAVENOUS

## 2021-10-18 MED ORDER — SODIUM CHLORIDE 0.9 % IV SOLN: INTRAVENOUS | Status: DC

## 2021-10-18 MED ORDER — PROPOFOL 10 MG/ML IV BOLUS
INTRAVENOUS | Status: DC | PRN
  Administered 2021-10-18: 60 mg via INTRAVENOUS
  Administered 2021-10-18: 10 mg via INTRAVENOUS

## 2021-10-18 NOTE — Discharge Instructions (Signed)

## 2021-10-18 NOTE — Anesthesia Procedure Notes (Signed)
Procedure Name: General with mask airway ?Date/Time: 10/18/2021 8:35 AM ?Performed by: Macie Burows, CRNA ?Pre-anesthesia Checklist: Patient identified, Emergency Drugs available, Patient being monitored, Suction available and Timeout performed ?Patient Re-evaluated:Patient Re-evaluated prior to induction ?Oxygen Delivery Method: Non-rebreather mask ?Preoxygenation: Pre-oxygenation with 100% oxygen ?Induction Type: IV induction ?Ventilation: Mask ventilation without difficulty ?Placement Confirmation: positive ETCO2 and breath sounds checked- equal and bilateral ?Dental Injury: Teeth and Oropharynx as per pre-operative assessment  ? ? ? ? ?

## 2021-10-18 NOTE — Interval H&P Note (Signed)
History and Physical Interval Note: ? ?10/18/2021 ?7:36 AM ? ?Gregory [name]  has presented today for surgery, with the diagnosis of atrial fib.  The various methods of treatment have been discussed with the patient and family. After consideration of risks, benefits and other options for treatment, the patient has consented to  Procedure(s): ?CARDIOVERSION (N/A) as a surgical intervention.  The patient's history has been reviewed, patient examined, no change in status, stable for surgery.  I have reviewed the patient's chart and labs.  Questions were answered to the patient's satisfaction.   ? ? ?Adrian Prows ? ? ?

## 2021-10-18 NOTE — CV Procedure (Signed)
Direct current cardioversion 10/18/2021 7:43 AM ? ?Indication symptomatic A. Fibrillation. ? ?Procedure: Using 70 mg of IV Propofol and 80 IV Lidocaine (for reducing venous pain) for achieving deep sedation, synchronized direct current cardioversion performed. Patient was delivered with 150 Joules of electricity X 1 with success to NSR. Patient tolerated the procedure well. No immediate complication noted.  ? ?Allergies as of 10/18/2021   ?No Known Allergies ?  ? ?  ?Medication List  ?  ? ?TAKE these medications   ? ?acetaminophen 500 MG tablet ?Commonly known as: TYLENOL ?Take 1,000 mg by mouth every 6 (six) hours as needed for moderate pain or headache. ?  ?amiodarone 200 MG tablet ?Commonly known as: PACERONE ?Take 1 tablet (200 mg total) by mouth daily. Take twice daily for 10 days then one tab daily. ?What changed: additional instructions ?  ?diltiazem 120 MG 24 hr capsule ?Commonly known as: TIAZAC ?Take 1 capsule (120 mg total) by mouth daily. ?  ?Eliquis 5 MG Tabs tablet ?Generic drug: apixaban ?TAKE 1 TABLET(5 MG) BY MOUTH TWICE DAILY ?  ?lisinopril 5 MG tablet ?Commonly known as: ZESTRIL ?TAKE 1 TABLET(5 MG) BY MOUTH DAILY ?  ?psyllium 58.6 % packet ?Commonly known as: METAMUCIL ?Take 1 packet by mouth daily as needed (constipation). ?  ?rosuvastatin 5 MG tablet ?Commonly known as: CRESTOR ?TAKE 1 TABLET(5 MG) BY MOUTH DAILY ?What changed: See the new instructions. ?  ? ?  ? ? ? ? ?[name], MD, [facility] ?10/18/2021, 7:43 AM ?Office: [phone] ?Fax: [phone] ?Pager: [phone]  ? ?

## 2021-10-18 NOTE — H&P (Signed)
? ? ?Primary Physician/Referring:  Christain Sacramento, MD ? ?Patient ID: AMIRR [name], male    DOB: 05/12/1941, 81 y.o.   MRN: [id number] ? ?Dyspnea, fatigue ?Recurrence of atrial fibrillation ? ?HPI:   ? ?Gregory [name]  is a 80 y.o. Caucasian male with  history of stage 3b CKD, hypertension, mild hyperlipidemia and moderate CAD by coronary CTA, paroxysmal atrial fibrillation. He has history of multiple cardioversions and eventually dofetilide was discontinued by Dr. [name] due to efficacy, and was placed on  Multaq and has mostly maintained sinus since Dec 2018. Due to recurrence of atrial fibrillation, he underwent 2nd ablation on 02/22/2019, 1st ablation being on 11/28/2016.  Unfortunately in spite of being on AAD, he has had recurrence of atrial fibrillation. Last cardioversion Mar 7th 2023. He called [place] back stating he is now fatigued and has mild dyspnea, back in atrial fib and hence now scheduled for cardioversion.  ? ? ?Otherwise he is presently doing well. No bleeding diathesis on Eliquis. No dizziness or syncope. Continues to remain active. ? ?Past Medical History:  ?Diagnosis Date  ? Arthritis   ? "right knee" (01/28/2016)  ? Diverticulosis of colon (without mention of hemorrhage)   ? Dysrhythmia   ? Hyperlipidemia   ? Internal hemorrhoids without mention of complication   ? Personal history of colonic polyps 01/17/2002  ? adenomatous, tublar adenoma 02/10/2007  ? PNA (pneumonia) 03/31/2012  ? ?Past Surgical History:  ?Procedure Laterality Date  ? ATRIAL FIBRILLATION ABLATION  11/28/2016  ? ATRIAL FIBRILLATION ABLATION N/A 11/28/2016  ? Procedure: Atrial Fibrillation Ablation;  Surgeon: Constance Haw, MD;  Location: [place] [facility];  Service: Cardiovascular;  Laterality: N/A;  ? ATRIAL FIBRILLATION ABLATION N/A 03/18/2019  ? Procedure: ATRIAL FIBRILLATION ABLATION;  Surgeon: Constance Haw, MD;  Location: [place] [facility];  Service: Cardiovascular;  Laterality: N/A;  ? CARDIOVERSION   05/03/2012  ? Procedure: CARDIOVERSION;  Surgeon: Laverda Page, MD;  Location: [place];  Service: Cardiovascular;  Laterality: N/A;  ? CARDIOVERSION  06/08/2012  ? Procedure: CARDIOVERSION;  Surgeon: Laverda Page, MD;  Location: [place];  Service: Cardiovascular;  Laterality: N/A;  [name]  ? CARDIOVERSION N/A 10/28/2013  ? Procedure: CARDIOVERSION;  Surgeon: Laverda Page, MD;  Location: [place];  Service: Cardiovascular;  Laterality: N/A;  ? CARDIOVERSION N/A 06/30/2014  ? Procedure: CARDIOVERSION;  Surgeon: Laverda Page, MD;  Location: [facility] ENDOSCOPY;  Service: Cardiovascular;  Laterality: N/A;  H&P in file  ? CARDIOVERSION N/A 09/05/2014  ? Procedure: CARDIOVERSION;  Surgeon: Adrian Prows, MD;  Location: [place];  Service: Cardiovascular;  Laterality: N/A;  ? CARDIOVERSION N/A 12/24/2015  ? Procedure: CARDIOVERSION;  Surgeon: Adrian Prows, MD;  Location: [place];  Service: Cardiovascular;  Laterality: N/A;  ? CARDIOVERSION N/A 01/30/2016  ? Procedure: CARDIOVERSION;  Surgeon: Adrian Prows, MD;  Location: [place];  Service: Cardiovascular;  Laterality: N/A;  ? CARDIOVERSION N/A 02/08/2016  ? Procedure: CARDIOVERSION;  Surgeon: Adrian Prows, MD;  Location: [place];  Service: Cardiovascular;  Laterality: N/A;  ? CARDIOVERSION N/A 02/08/2019  ? Procedure: CARDIOVERSION;  Surgeon: Adrian Prows, MD;  Location: [place];  Service: Cardiovascular;  Laterality: N/A;  ? CARDIOVERSION N/A 05/24/2019  ? Procedure: CARDIOVERSION;  Surgeon: Elouise Munroe, MD;  Location: [place];  Service: Cardiovascular;  Laterality: N/A;  ? CARDIOVERSION N/A 09/21/2019  ? Procedure: CARDIOVERSION;  Surgeon: Adrian Prows, MD;  Location: [place];  Service: Cardiovascular;  Laterality: N/A;  ?  CARDIOVERSION N/A 12/01/2019  ? Procedure: CARDIOVERSION;  Surgeon: Adrian Prows, MD;  Location: [place];  Service: Cardiovascular;  Laterality: N/A;  ? CARDIOVERSION N/A 08/20/2021  ? Procedure:  CARDIOVERSION;  Surgeon: Adrian Prows, MD;  Location: [place];  Service: Cardiovascular;  Laterality: N/A;  ? CATARACT EXTRACTION W/ INTRAOCULAR LENS  IMPLANT, BILATERAL Bilateral   ? COLONOSCOPY    ? INGUINAL HERNIA REPAIR Bilateral   ? as child  ? KNEE ARTHROSCOPY Right 2004  ? ?Family History  ?Problem Relation Age of Onset  ? Cancer Mother   ?     bladder  ? Bladder Cancer Mother   ? Heart disease Father   ? Stroke Father   ? Colon cancer Neg Hx   ? Stomach cancer Neg Hx   ? Esophageal cancer Neg Hx   ? Liver cancer Neg Hx   ? Pancreatic cancer Neg Hx   ?  ?Social History  ? ?Tobacco Use  ? Smoking status: Never  ? Smokeless tobacco: Never  ?Substance Use Topics  ? Alcohol use: Yes  ?  Alcohol/week: 1.0 standard drink  ?  Types: 1 Glasses of wine per week  ?  Comment: daily  ? ?Marital Status: Married  ?ROS  ?Review of Systems  ?Cardiovascular:  Positive for dyspnea on exertion and palpitations. Negative for chest pain and leg swelling.  ?Objective  ?Blood pressure 127/70, pulse 100, temperature 97.8 ?F (36.6 ?C), temperature source Tympanic, resp. rate 14, height 5' 11" (1.803 m), weight 85.3 kg, SpO2 95 %.  ? ?  10/18/2021  ?  7:18 AM 10/18/2021  ?  6:43 AM 08/20/2021  ?  9:06 AM  ?Vitals with BMI  ?Height  5' 11"   ?Weight  188 lbs 1 oz   ?BMI  26.24   ?Systolic 453 646 803  ?Diastolic 70 212 67  ?Pulse 100 108 73  ?  ? Physical Exam ?Constitutional:   ?   General: He is not in acute distress. ?   Appearance: He is well-developed.  ?Neck:  ?   Vascular: No carotid bruit or JVD.  ?Cardiovascular:  ?   Rate and Rhythm: Tachycardia present. Rhythm irregular.  ?   Pulses: Intact distal pulses.  ?   Heart sounds: No murmur heard. ?  No gallop.  ?Pulmonary:  ?   Effort: Pulmonary effort is normal. No accessory muscle usage or respiratory distress.  ?   Breath sounds: Normal breath sounds.  ?Abdominal:  ?   General: Bowel sounds are normal.  ?   Palpations: Abdomen is soft.  ?Musculoskeletal:  ?   Right lower leg: No  edema.  ?   Left lower leg: No edema.  ? ?Laboratory examination:  ? ?Recent Labs  ?  01/04/21 ?1010 08/19/21 ?1026 10/08/21 ?1344  ?NA 144 140 142  ?K 4.7 4.2 4.8  ?CL 104 103 105  ?CO2 27 23 19*  ?GLUCOSE 113* 109* 108*  ?BUN 22 28* 25  ?CREATININE 1.44* 1.61* 1.87*  ?CALCIUM 9.4 9.4 9.0  ? ?estimated creatinine clearance is 34.1 mL/min (A) (by C-G formula based on SCr of 1.87 mg/dL (H)).  ? ?  Latest Ref Rng & Units 10/08/2021  ?  1:44 PM 08/19/2021  ? 10:26 AM 01/04/2021  ? 10:10 AM  ?CMP  ?Glucose 70 - 99 mg/dL 108   109   113    ?BUN 8 - 27 mg/dL _0 ?Creatinine 0.76 - 1.27  mg/dL 1.87   1.61   1.44    ?Sodium 134 - 144 mmol/L 142   140   144    ?Potassium 3.5 - 5.2 mmol/L 4.8   4.2   4.7    ?Chloride 96 - 106 mmol/L 105   103   104    ?CO2 20 - 29 mmol/L _0 ?Calcium 8.6 - 10.2 mg/dL 9.0   9.4   9.4    ?Total Protein 6.0 - 8.5 g/dL   6.4    ?Total Bilirubin 0.0 - 1.2 mg/dL   0.5    ?Alkaline Phos 44 - 121 IU/L   55    ?AST 0 - 40 IU/L   15    ?ALT 0 - 44 IU/L   13    ? ? ?  Latest Ref Rng & Units 01/04/2021  ? 10:10 AM 07/10/2020  ?  8:17 AM 11/28/2019  ? 12:10 PM  ?CBC  ?WBC 3.4 - 10.8 x10E3/uL 5.0   4.6   4.7    ?Hemoglobin 13.0 - 17.7 g/dL 14.4   14.8   13.7    ?Hematocrit 37.5 - 51.0 % 43.0   44.5   41.6    ?Platelets 150 - 450 x10E3/uL 186   183   167    ? ?Lipid Panel  ?   ?Component Value Date/Time  ? CHOL 134 07/10/2020 0817  ? TRIG 56 07/10/2020 0817  ? HDL 50 07/10/2020 0817  ? [place] 72 07/10/2020 0817  ? ?HEMOGLOBIN A1C ?No results found for: HGBA1C, MPG ?TSH ?No results for input(s): TSH in the last 8760 hours. ? ?External labs:  ? ?07/04/2019: PSA 4.900  ? ?06/18/2018: TSH 1.69.  Normal H and H, MCV 98, MCH 33, CBC otherwise normal.  Creatinine 1.82, EGFR 35/41, potassium 5.2, CMP otherwise normal. ? ?09/28/2017: Creatinine 1.84, EGFR 35/41, potassium 5.6, CMP otherwise normal.  Normal H&H, MCV 99, CBC otherwise normal. ? ?05/23/2015: Total cholesterol 208, triglycerides 113, HDL 53,  LDL 132, creatinine 1.53, eGFR 44, potassium 4.6, CMP otherwise normal, CBC normal, PSA elevated at 5.3 ? ?Medications and allergies  ?No Known Allergies  ? ?No current facility-administered medications on file

## 2021-10-18 NOTE — Anesthesia Postprocedure Evaluation (Signed)
Anesthesia Post Note ? ?Patient: Gregory [name] ? ?Procedure(s) Performed: CARDIOVERSION ? ?  ? ?Patient location during evaluation: PACU ?Anesthesia Type: General ?Level of consciousness: awake ?Pain management: pain level controlled ?Vital Signs Assessment: post-procedure vital signs reviewed and stable ?Respiratory status: spontaneous breathing and respiratory function stable ?Cardiovascular status: stable ?Postop Assessment: no apparent nausea or vomiting ?Anesthetic complications: no ? ? ?No notable events documented. ? ?Last Vitals:  ?Vitals:  ? 10/18/21 0815 10/18/21 0816  ?BP: (!) 96/56 102/60  ?Pulse: 63 66  ?Resp: 17   ?Temp:    ?SpO2: 95% 97%  ?  ?Last Pain:  ?Vitals:  ? 10/18/21 0816  ?TempSrc:   ?PainSc: 0-No pain  ? ? ?  ?  ?  ?  ?  ?  ? ?Mellody Dance ? ? ? ? ?

## 2021-10-18 NOTE — Transfer of Care (Signed)
Immediate Anesthesia Transfer of Care Note ? ?Patient: Gregory [name] ? ?Procedure(s) Performed: CARDIOVERSION ? ?Patient Location: Endoscopy Unit ? ?Anesthesia Type:General ? ?Level of Consciousness: awake, alert  and oriented ? ?Airway & Oxygen Therapy: Patient Spontanous Breathing ? ?Post-op Assessment: Report given to RN and Post -op Vital signs reviewed and stable ? ?Post vital signs: Reviewed and stable ? ?Last Vitals:  ?Vitals Value Taken Time  ?BP    ?Temp    ?Pulse    ?Resp    ?SpO2    ? ? ?Last Pain:  ?Vitals:  ? 10/18/21 0643  ?TempSrc: Tympanic  ?PainSc: 0-No pain  ?   ? ?  ? ?Complications: No notable events documented. ?

## 2021-10-22 ENCOUNTER — Encounter: Payer: Self-pay | Admitting: Physical Therapy

## 2021-10-22 ENCOUNTER — Ambulatory Visit (INDEPENDENT_AMBULATORY_CARE_PROVIDER_SITE_OTHER): Payer: Medicare Other | Admitting: Physical Therapy

## 2021-10-22 DIAGNOSIS — M25661 Stiffness of right knee, not elsewhere classified: Secondary | ICD-10-CM

## 2021-10-22 DIAGNOSIS — M6281 Muscle weakness (generalized): Secondary | ICD-10-CM

## 2021-10-22 DIAGNOSIS — M25561 Pain in right knee: Secondary | ICD-10-CM | POA: Diagnosis not present

## 2021-10-22 NOTE — Therapy (Addendum)
?OUTPATIENT PHYSICAL THERAPY TREATMENT/PROGRESS NOTE ? ? ? ?Patient Name: Gregory [name] ?MRN: [id number] ?DOB:[birth year], 81 y.o., male ?Today's Date: 10/22/2021 ? ? ?Physical Therapy Progress Note ? ?Dates of Reporting Period: 09/19/21  to 10/22/21 ? ?End of session:  ?Visit: 10  ?POC date: 11/14/21 ?Time in: 1010,  Time out 1053 ?43 min;  ? ? ? ?Past Medical History:  ?Diagnosis Date  ? Arthritis   ? "right knee" (01/28/2016)  ? Diverticulosis of colon (without mention of hemorrhage)   ? Dysrhythmia   ? Hyperlipidemia   ? Internal hemorrhoids without mention of complication   ? Personal history of colonic polyps 01/17/2002  ? adenomatous, tublar adenoma 02/10/2007  ? PNA (pneumonia) 03/31/2012  ? ?Past Surgical History:  ?Procedure Laterality Date  ? ATRIAL FIBRILLATION ABLATION  11/28/2016  ? ATRIAL FIBRILLATION ABLATION N/A 11/28/2016  ? Procedure: Atrial Fibrillation Ablation;  Surgeon: Regan Lemming, MD;  Location: [facility] INVASIVE CV LAB;  Service: Cardiovascular;  Laterality: N/A;  ? ATRIAL FIBRILLATION ABLATION N/A 03/18/2019  ? Procedure: ATRIAL FIBRILLATION ABLATION;  Surgeon: Regan Lemming, MD;  Location: [facility] INVASIVE CV LAB;  Service: Cardiovascular;  Laterality: N/A;  ? CARDIOVERSION  05/03/2012  ? Procedure: CARDIOVERSION;  Surgeon: [name], MD;  Location: [facility] ENDOSCOPY;  Service: Cardiovascular;  Laterality: N/A;  ? CARDIOVERSION  06/08/2012  ? Procedure: CARDIOVERSION;  Surgeon: [name], MD;  Location: [facility] ENDOSCOPY;  Service: Cardiovascular;  Laterality: N/A;  Lawrence Marseilles Wille Celeste  ? CARDIOVERSION N/A 10/28/2013  ? Procedure: CARDIOVERSION;  Surgeon: [name], MD;  Location: [facility] ENDOSCOPY;  Service: Cardiovascular;  Laterality: N/A;  ? CARDIOVERSION N/A 06/30/2014  ? Procedure: CARDIOVERSION;  Surgeon: [name], MD;  Location: [facility] ENDOSCOPY;  Service: Cardiovascular;  Laterality: N/A;  H&P in file  ? CARDIOVERSION N/A 09/05/2014  ? Procedure: CARDIOVERSION;  Surgeon: [name], MD;  Location: [facility] ENDOSCOPY;  Service: Cardiovascular;  Laterality: N/A;  ? CARDIOVERSION N/A 12/24/2015  ? Procedure: CARDIOVERSION;  Surgeon: [name], MD;  Location: [facility] ENDOSCOPY;  Service: Cardiovascular;  Laterality: N/A;  ? CARDIOVERSION N/A 01/30/2016  ? Procedure: CARDIOVERSION;  Surgeon: [name], MD;  Location: [facility] ENDOSCOPY;  Service: Cardiovascular;  Laterality: N/A;  ? CARDIOVERSION N/A 02/08/2016  ? Procedure: CARDIOVERSION;  Surgeon: [name], MD;  Location: [facility] ENDOSCOPY;  Service: Cardiovascular;  Laterality: N/A;  ? CARDIOVERSION N/A 02/08/2019  ? Procedure: CARDIOVERSION;  Surgeon: [name], MD;  Location: [facility] ENDOSCOPY;  Service: Cardiovascular;  Laterality: N/A;  ? CARDIOVERSION N/A 05/24/2019  ? Procedure: CARDIOVERSION;  Surgeon: Parke Poisson, MD;  Location: [facility] ENDOSCOPY;  Service: Cardiovascular;  Laterality: N/A;  ? CARDIOVERSION N/A 09/21/2019  ? Procedure: CARDIOVERSION;  Surgeon: [name], MD;  Location: [facility] ENDOSCOPY;  Service: Cardiovascular;  Laterality: N/A;  ? CARDIOVERSION N/A 12/01/2019  ? Procedure: CARDIOVERSION;  Surgeon: [name], MD;  Location: [facility] ENDOSCOPY;  Service: Cardiovascular;  Laterality: N/A;  ? CARDIOVERSION N/A 08/20/2021  ? Procedure: CARDIOVERSION;  Surgeon: [name], MD;  Location: [facility] ENDOSCOPY;  Service: Cardiovascular;  Laterality: N/A;  ? CARDIOVERSION N/A 10/18/2021  ? Procedure: CARDIOVERSION;  Surgeon: [name], MD;  Location: [facility] ENDOSCOPY;  Service: Cardiovascular;  Laterality: N/A;  ? CATARACT EXTRACTION W/ INTRAOCULAR LENS  IMPLANT, BILATERAL Bilateral   ? COLONOSCOPY    ? INGUINAL HERNIA REPAIR Bilateral   ? as child  ? KNEE ARTHROSCOPY Right 2004  ? ?Patient Active Problem List  ? Diagnosis Date Noted  ? 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 BPH with elevated PSA 12/13/2019  ? Dermatitis 12/13/2019  ? Encounter for Medicare annual wellness exam 12/13/2019  ? Chronic pain of right knee 10/10/2019  ? Atypical  atrial flutter ([place]) 05/17/2019  ? Acquired thrombophilia ([place]) 05/17/2019  ? 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/06/2018  ? Primary osteoarthritis of right knee 08/06/2018  ? Pes anserinus bursitis of left knee 10/31/2016  ? Allergic rhinitis 02/14/2016  ? Hearing loss 02/13/2016  ? CKD (chronic kidney disease) stage 3, GFR 30-59 ml/min (HCC) 02/13/2016  ? Atrial fibrillation ([place]) 02/07/2016  ? Paroxysmal atrial fibrillation ([place]) 03/31/2012  ? Hypertension 03/31/2012  ? Hemorrhoids, external 05/15/2011  ? ? ?PCP: Christain Sacramento, MD ? ?REFERRING PROVIDER: Vickey Huger, MD ? ?REFERRING DIAG: S/P R TKE (uni)  ? ?THERAPY DIAG:  ?Acute pain of right knee ? ?Stiffness of right knee, not elsewhere classified ? ?Muscle weakness (generalized) ? ?ONSET DATE:  Surgery date: 09/11/21 R Uni TKA  ? ?SUBJECTIVE:  ? ?SUBJECTIVE STATEMENT: ? Pt Had cardioversion last week.  States soreness in knee is a bit better.   ?  ? ?PERTINENT HISTORY: ?Chronic A-Fib,  ? ?PAIN:  ?Are you having pain? Yes: NPRS scale: 0- 2/10 ?Pain location: R knee ?Pain description: achey ?Aggravating factors: increased activity  ?Relieving factors: Rest, ice  ? ? ?PRECAUTIONS: Knee ? ?WEIGHT BEARING RESTRICTIONS No ? ?FALLS:  ?Has patient fallen in last 6 months? No ? ? ?PLOF: Independent ? ?PATIENT GOALS  return to regular use of knee/leg, PLOF  ? ?OBJECTIVE:  ? ? ?COGNITION: ? Overall cognitive status: Within functional limits for tasks assessed   ?  ? ?PALPATION: ?R knee: healing incision, dressing in place. Mild swelling, wearing compression sock.  ? ? ?LE ROM:  Hip ROM: Mild limitation ? ?Passive ROM Right ?09/19/21 R ?09/23/21 R ?10/01/21 R ?10/17/21 R ?10/22/21  ?       ?       ?       ?       ?       ?       ?Knee flexion 93 112 113 110 118 A/ 120 Passive  ?Knee extension -3 -3   0  ?       ?       ?       ?       ? ? ?LE MMT: ? Not tested  ? ? ?TODAY'S TREATMENT: ? ?10/22/2021 ?Therapeutic Exercise: ?Aerobic:  ?Supine:  SKTC 20 sec x3 on R for knee flexion; Heel  slides/strap x 20; Quad sets x 15; Ankle pumps x 20,  ?Seated:   LAQ 3 lb x 20; sit to stand x 10;  ?Standing:  [place] x 20 on R; March, slow x 20,  Step ups fwd 6 in x 10 on R; Stairs 5 steps recipricol, no UE support x 5;  ?Stretches:   ?Neuromuscular Re-education: ?Manual Therapy:  PROM for R knee flex and ext in supine,  Anke mobs to inc DF and EV,  ?Gait Training: ? ? ? ?PATIENT EDUCATION:  ?Education details: reviewed HEP.  ?Person educated: Patient and Spouse ?Education method: Explanation, Demonstration, Tactile cues, Verbal cues, and Handouts ?Education comprehension: verbalized understanding, returned demonstration, verbal cues required, tactile cues required, and needs further education ? ? ?HOME EXERCISE PROGRAM: ?Access Code: [email or web address] ?Updated 10/17/21 ? ? ?ASSESSMENT: ? ?CLINICAL IMPRESSION: ?PN: Pt making good progress. He has improved ROM today from last  visit. He is having minimal pain and good ability for progression of strengthening. He has mild stiffness at end range for flexion, and will benefit from continued work on achieving full ROM. He also has noted stiffness in bil R>L ankle with rearfoot inv. Gait mechanics with minimal deficits besides this, but ankle position could potentially effect knee pain with prolonged ambulation. Will continue to benefit from ankle stretching , strength, and mobilization. Some deficits may be stemming from recent ankle surgery/peroneal repair, and some ankle stiffness may have been longstanding prior to surgery.  ? ?OBJECTIVE IMPAIRMENTS Abnormal gait, decreased activity tolerance, decreased balance, decreased endurance, decreased knowledge of use of DME, decreased mobility, difficulty walking, decreased ROM, decreased strength, hypomobility, increased muscle spasms, impaired flexibility, and pain.  ? ?ACTIVITY LIMITATIONS cleaning, community activity, driving, meal prep, yard work, and shopping.  ? ?PERSONAL FACTORS  none  are also affecting patient's functional  outcome.  ? ? ?REHAB POTENTIAL: Good ? ?CLINICAL DECISION MAKING: Stable/uncomplicated ? ?EVALUATION COMPLEXITY: Low ? ? ?GOALS: ?Goals reviewed with patient? Yes ? ?SHORT TERM GOALS: Target date: 10/03/2021 ? ?Pt

## 2021-10-23 ENCOUNTER — Encounter: Payer: Self-pay | Admitting: Physical Therapy

## 2021-10-24 ENCOUNTER — Ambulatory Visit (INDEPENDENT_AMBULATORY_CARE_PROVIDER_SITE_OTHER): Payer: Medicare Other | Admitting: Physical Therapy

## 2021-10-24 ENCOUNTER — Encounter: Payer: Self-pay | Admitting: Physical Therapy

## 2021-10-24 DIAGNOSIS — M6281 Muscle weakness (generalized): Secondary | ICD-10-CM | POA: Diagnosis not present

## 2021-10-24 DIAGNOSIS — M25661 Stiffness of right knee, not elsewhere classified: Secondary | ICD-10-CM

## 2021-10-24 DIAGNOSIS — M25561 Pain in right knee: Secondary | ICD-10-CM

## 2021-10-24 NOTE — Therapy (Addendum)
?OUTPATIENT PHYSICAL THERAPY TREATMENT ? ? ?Patient Name: Gregory [name] ?MRN: [id number] ?DOB:1940-11-18, 81 y.o., male ?Today's Date: 10/24/2021 ? ? ? PT End of Session - 10/24/21 1025   ? ? Visit Number 11   ? Number of Visits 16   ? Date for PT Re-Evaluation 11/14/21   ? Authorization Type Medicare/ PN done at visit 10   ? PT Start Time 1018   ? PT Stop Time 1059   ? PT Time Calculation (min) 41 min   ? Activity Tolerance Patient tolerated treatment well   ? Behavior During Therapy [facility] for tasks assessed/performed   ? ?  ?  ? ?  ? ? ? ? ? ?Past Medical History:  ?Diagnosis Date  ? Arthritis   ? "right knee" (01/28/2016)  ? Diverticulosis of colon (without mention of hemorrhage)   ? Dysrhythmia   ? Hyperlipidemia   ? Internal hemorrhoids without mention of complication   ? Personal history of colonic polyps 01/17/2002  ? adenomatous, tublar adenoma 02/10/2007  ? PNA (pneumonia) 03/31/2012  ? ?Past Surgical History:  ?Procedure Laterality Date  ? ATRIAL FIBRILLATION ABLATION  11/28/2016  ? ATRIAL FIBRILLATION ABLATION N/A 11/28/2016  ? Procedure: Atrial Fibrillation Ablation;  Surgeon: Constance Haw, MD;  Location: [place] CV LAB;  Service: Cardiovascular;  Laterality: N/A;  ? ATRIAL FIBRILLATION ABLATION N/A 03/18/2019  ? Procedure: ATRIAL FIBRILLATION ABLATION;  Surgeon: Constance Haw, MD;  Location: [place] CV LAB;  Service: Cardiovascular;  Laterality: N/A;  ? CARDIOVERSION  05/03/2012  ? Procedure: CARDIOVERSION;  Surgeon: Laverda Page, MD;  Location: [place];  Service: Cardiovascular;  Laterality: N/A;  ? CARDIOVERSION  06/08/2012  ? Procedure: CARDIOVERSION;  Surgeon: Laverda Page, MD;  Location: [place];  Service: Cardiovascular;  Laterality: N/A;  [name]  ? CARDIOVERSION N/A 10/28/2013  ? Procedure: CARDIOVERSION;  Surgeon: Laverda Page, MD;  Location: [place];  Service: Cardiovascular;  Laterality: N/A;  ? CARDIOVERSION N/A 06/30/2014  ? Procedure:  CARDIOVERSION;  Surgeon: Laverda Page, MD;  Location: [facility] ENDOSCOPY;  Service: Cardiovascular;  Laterality: N/A;  H&P in file  ? CARDIOVERSION N/A 09/05/2014  ? Procedure: CARDIOVERSION;  Surgeon: Adrian Prows, MD;  Location: [place];  Service: Cardiovascular;  Laterality: N/A;  ? CARDIOVERSION N/A 12/24/2015  ? Procedure: CARDIOVERSION;  Surgeon: Adrian Prows, MD;  Location: [place];  Service: Cardiovascular;  Laterality: N/A;  ? CARDIOVERSION N/A 01/30/2016  ? Procedure: CARDIOVERSION;  Surgeon: Adrian Prows, MD;  Location: [place];  Service: Cardiovascular;  Laterality: N/A;  ? CARDIOVERSION N/A 02/08/2016  ? Procedure: CARDIOVERSION;  Surgeon: Adrian Prows, MD;  Location: [place];  Service: Cardiovascular;  Laterality: N/A;  ? CARDIOVERSION N/A 02/08/2019  ? Procedure: CARDIOVERSION;  Surgeon: Adrian Prows, MD;  Location: [place];  Service: Cardiovascular;  Laterality: N/A;  ? CARDIOVERSION N/A 05/24/2019  ? Procedure: CARDIOVERSION;  Surgeon: Elouise Munroe, MD;  Location: [place];  Service: Cardiovascular;  Laterality: N/A;  ? CARDIOVERSION N/A 09/21/2019  ? Procedure: CARDIOVERSION;  Surgeon: Adrian Prows, MD;  Location: [place];  Service: Cardiovascular;  Laterality: N/A;  ? CARDIOVERSION N/A 12/01/2019  ? Procedure: CARDIOVERSION;  Surgeon: Adrian Prows, MD;  Location: [place];  Service: Cardiovascular;  Laterality: N/A;  ? CARDIOVERSION N/A 08/20/2021  ? Procedure: CARDIOVERSION;  Surgeon: Adrian Prows, MD;  Location: [place];  Service: Cardiovascular;  Laterality: N/A;  ? CARDIOVERSION N/A 10/18/2021  ? Procedure: CARDIOVERSION;  Surgeon: Adrian Prows, MD;  Location:  [facility] ENDOSCOPY;  Service: Cardiovascular;  Laterality: N/A;  ? CATARACT EXTRACTION W/ INTRAOCULAR LENS  IMPLANT, BILATERAL Bilateral   ? COLONOSCOPY    ? INGUINAL HERNIA REPAIR Bilateral   ? as child  ? KNEE ARTHROSCOPY Right 2004  ? ?Patient Active Problem List  ? Diagnosis Date Noted  ? 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 BPH with elevated PSA 12/13/2019  ? Dermatitis 12/13/2019  ? Encounter for Medicare annual wellness exam 12/13/2019  ? Chronic pain of right knee 10/10/2019  ? Atypical atrial flutter ([place]) 05/17/2019  ? Acquired thrombophilia ([place]) 05/17/2019  ? 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/06/2018  ? Primary osteoarthritis of right knee 08/06/2018  ? Pes anserinus bursitis of left knee 10/31/2016  ? Allergic rhinitis 02/14/2016  ? Hearing loss 02/13/2016  ? CKD (chronic kidney disease) stage 3, GFR 30-59 ml/min (HCC) 02/13/2016  ? Atrial fibrillation ([place]) 02/07/2016  ? Paroxysmal atrial fibrillation ([place]) 03/31/2012  ? Hypertension 03/31/2012  ? Hemorrhoids, external 05/15/2011  ? ? ?PCP: Christain Sacramento, MD ? ?REFERRING PROVIDER: Vickey Huger, MD ? ?REFERRING DIAG: S/P R TKE (uni)  ? ?THERAPY DIAG:  ?Acute pain of right knee ? ?Stiffness of right knee, not elsewhere classified ? ?Muscle weakness (generalized) ? ?ONSET DATE:  Surgery date: 09/11/21 R Uni TKA  ? ?SUBJECTIVE:  ? ?SUBJECTIVE STATEMENT: ? Pt states doing well.  Had good report from MD visit this week.  ?  ? ?PERTINENT HISTORY: ?Chronic A-Fib,  ? ?PAIN:  ?Are you having pain? Yes: NPRS scale: 2/10 ?Pain location: R knee ?Pain description: achey ?Aggravating factors: increased activity  ?Relieving factors: Rest, ice  ? ? ?PRECAUTIONS: Knee ? ?WEIGHT BEARING RESTRICTIONS No ? ?FALLS:  ?Has patient fallen in last 6 months? No ? ? ?PLOF: Independent ? ?PATIENT GOALS  return to regular use of knee/leg, PLOF  ? ?OBJECTIVE:  ? ? ?COGNITION: ? Overall cognitive status: Within functional limits for tasks assessed   ?  ? ?PALPATION: ?R knee: healing incision, dressing in place. Mild swelling, wearing compression sock.  ? ? ?LE ROM:  Hip ROM: Mild limitation ? ?Passive ROM Right ?09/19/21 R ?09/23/21 R ?10/01/21 R ?10/17/21  ?      ?      ?      ?      ?      ?      ?Knee flexion 93 112 113 110  ?Knee extension -3 -3    ?      ?      ?      ?      ?  (Blank rows = not tested) ? ? ?LE MMT: ? Not tested  ? ? ?TODAY'S TREATMENT: ? ?10/24/2021 ?Therapeutic Exercise: ?Aerobic: recumbent bike L2 x 8 min;  ?Supine:  SKTC 20 sec x3 on R for knee flexion; Quad sets with towel roll on ankle x 10 on R;  ?Seated:   LAQ 4 lb x 20; [place] GTB x 20;  ?Standing:  [place] x 20 on R; March, slow x 20,  Step ups fwd 6 in x 10 on R; Stairs 5 steps recipricol, no UE support x 5; L/R weight shifts with TKE/quad set on R;  ?Stretches: Seated HSS 30 sec x 2 bil;   ?Neuromuscular Re-education: ?Manual Therapy:  PROM for R knee flex and ext in supine, seated, and prone,  ?Gait Training: ? ? ? ?PATIENT EDUCATION:  ?Education details: reviewed HEP.  ?  Person educated: Patient and Spouse ?Education method: Explanation, Demonstration, Tactile cues, Verbal cues, and Handouts ?Education comprehension: verbalized understanding, returned demonstration, verbal cues required, tactile cues required, and needs further education ? ? ?HOME EXERCISE PROGRAM: ?Access Code: [id number] ?Updated 10/17/21 ? ? ?ASSESSMENT: ? ?CLINICAL IMPRESSION: ?Pt progressing well with strength and ROM. Discussed continuing HS stretching and quad sets for increased knee extension, and practiced in standing today as well. Pt with slight decrease in extension in standing vs L side. Will benefit from continued care.  ? ?OBJECTIVE IMPAIRMENTS Abnormal gait, decreased activity tolerance, decreased balance, decreased endurance, decreased knowledge of use of DME, decreased mobility, difficulty walking, decreased ROM, decreased strength, hypomobility, increased muscle spasms, impaired flexibility, and pain.  ? ?ACTIVITY LIMITATIONS cleaning, community activity, driving, meal prep, yard work, and shopping.  ? ?PERSONAL FACTORS  none  are also affecting patient's functional outcome.  ? ? ?REHAB POTENTIAL: Good ? ?CLINICAL DECISION MAKING: Stable/uncomplicated ? ?EVALUATION COMPLEXITY: Low ? ? ?GOALS: ?Goals reviewed with patient? Yes ? ?SHORT  TERM GOALS: Target date: 10/03/2021 ? ?Pt to be independent with initial HEP ?Goal status: INITIAL ? ?2.  Pt to demo improved knee flexion by at least 15 degrees  ?Goal status: INITIAL ? ?3.  Pt to report pain

## 2021-10-29 ENCOUNTER — Ambulatory Visit (INDEPENDENT_AMBULATORY_CARE_PROVIDER_SITE_OTHER): Payer: Medicare Other | Admitting: Physical Therapy

## 2021-10-29 ENCOUNTER — Encounter: Payer: Self-pay | Admitting: Physical Therapy

## 2021-10-29 DIAGNOSIS — M6281 Muscle weakness (generalized): Secondary | ICD-10-CM

## 2021-10-29 DIAGNOSIS — M25561 Pain in right knee: Secondary | ICD-10-CM

## 2021-10-29 DIAGNOSIS — M25661 Stiffness of right knee, not elsewhere classified: Secondary | ICD-10-CM

## 2021-10-29 NOTE — Therapy (Signed)
?OUTPATIENT PHYSICAL THERAPY TREATMENT ? ? ?Patient Name: Gregory [name] ?MRN: [id number] ?DOB:July 28, 1940, 81 y.o., male ?Today's Date: 10/29/2021 ? ? ? PT End of Session - 10/29/21 1103   ? ? Visit Number 12   ? Number of Visits 16   ? Date for PT Re-Evaluation 11/14/21   ? Authorization Type Medicare/ PN done at visit 10   ? PT Start Time 1059   ? PT Stop Time 1143   ? PT Time Calculation (min) 44 min   ? Activity Tolerance Patient tolerated treatment well   ? Behavior During Therapy [facility] for tasks assessed/performed   ? ?  ?  ? ?  ? ? ? ? ? ?Past Medical History:  ?Diagnosis Date  ? Arthritis   ? "right knee" (01/28/2016)  ? Diverticulosis of colon (without mention of hemorrhage)   ? Dysrhythmia   ? Hyperlipidemia   ? Internal hemorrhoids without mention of complication   ? Personal history of colonic polyps 01/17/2002  ? adenomatous, tublar adenoma 02/10/2007  ? PNA (pneumonia) 03/31/2012  ? ?Past Surgical History:  ?Procedure Laterality Date  ? ATRIAL FIBRILLATION ABLATION  11/28/2016  ? ATRIAL FIBRILLATION ABLATION N/A 11/28/2016  ? Procedure: Atrial Fibrillation Ablation;  Surgeon: Regan Lemming, MD;  Location: [facility] INVASIVE CV LAB;  Service: Cardiovascular;  Laterality: N/A;  ? ATRIAL FIBRILLATION ABLATION N/A 03/18/2019  ? Procedure: ATRIAL FIBRILLATION ABLATION;  Surgeon: Regan Lemming, MD;  Location: [facility] INVASIVE CV LAB;  Service: Cardiovascular;  Laterality: N/A;  ? CARDIOVERSION  05/03/2012  ? Procedure: CARDIOVERSION;  Surgeon: [name], MD;  Location: [facility] ENDOSCOPY;  Service: Cardiovascular;  Laterality: N/A;  ? CARDIOVERSION  06/08/2012  ? Procedure: CARDIOVERSION;  Surgeon: [name], MD;  Location: [facility] ENDOSCOPY;  Service: Cardiovascular;  Laterality: N/A;  Lawrence Marse[place] Wille Celeste  ? CARDIOVERSION N/A 10/28/2013  ? Procedure: CARDIOVERSION;  Surgeon: [name], MD;  Location: [facility] ENDOSCOPY;  Service: Cardiovascular;  Laterality: N/A;  ? CARDIOVERSION N/A 06/30/2014  ? Procedure:  CARDIOVERSION;  Surgeon: [name], MD;  Location: [facility] ENDOSCOPY;  Service: Cardiovascular;  Laterality: N/A;  H&P in file  ? CARDIOVERSION N/A 09/05/2014  ? Procedure: CARDIOVERSION;  Surgeon: [name], MD;  Location: [facility] ENDOSCOPY;  Service: Cardiovascular;  Laterality: N/A;  ? CARDIOVERSION N/A 12/24/2015  ? Procedure: CARDIOVERSION;  Surgeon: [name], MD;  Location: [facility] ENDOSCOPY;  Service: Cardiovascular;  Laterality: N/A;  ? CARDIOVERSION N/A 01/30/2016  ? Procedure: CARDIOVERSION;  Surgeon: [name], MD;  Location: [facility] ENDOSCOPY;  Service: Cardiovascular;  Laterality: N/A;  ? CARDIOVERSION N/A 02/08/2016  ? Procedure: CARDIOVERSION;  Surgeon: [name], MD;  Location: [facility] ENDOSCOPY;  Service: Cardiovascular;  Laterality: N/A;  ? CARDIOVERSION N/A 02/08/2019  ? Procedure: CARDIOVERSION;  Surgeon: [name], MD;  Location: [facility] ENDOSCOPY;  Service: Cardiovascular;  Laterality: N/A;  ? CARDIOVERSION N/A 05/24/2019  ? Procedure: CARDIOVERSION;  Surgeon: Parke Poisson, MD;  Location: [facility] ENDOSCOPY;  Service: Cardiovascular;  Laterality: N/A;  ? CARDIOVERSION N/A 09/21/2019  ? Procedure: CARDIOVERSION;  Surgeon: [name], MD;  Location: [facility] ENDOSCOPY;  Service: Cardiovascular;  Laterality: N/A;  ? CARDIOVERSION N/A 12/01/2019  ? Procedure: CARDIOVERSION;  Surgeon: [name], MD;  Location: [facility] ENDOSCOPY;  Service: Cardiovascular;  Laterality: N/A;  ? CARDIOVERSION N/A 08/20/2021  ? Procedure: CARDIOVERSION;  Surgeon: [name], MD;  Location: [facility] ENDOSCOPY;  Service: Cardiovascular;  Laterality: N/A;  ? CARDIOVERSION N/A 10/18/2021  ? Procedure: CARDIOVERSION;  Surgeon: [name], MD;  Location:  [facility] ENDOSCOPY;  Service: Cardiovascular;  Laterality: N/A;  ? CATARACT EXTRACTION W/ INTRAOCULAR LENS  IMPLANT, BILATERAL Bilateral   ? COLONOSCOPY    ? INGUINAL HERNIA REPAIR Bilateral   ? as child  ? KNEE ARTHROSCOPY Right 2004  ? ?Patient Active Problem List  ? Diagnosis Date Noted  ? 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 BPH with elevated PSA 12/13/2019  ? Dermatitis 12/13/2019  ? Encounter for Medicare annual wellness exam 12/13/2019  ? Chronic pain of right knee 10/10/2019  ? Atypical atrial flutter ([place]) 05/17/2019  ? Acquired thrombophilia ([place]) 05/17/2019  ? 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/06/2018  ? Primary osteoarthritis of right knee 08/06/2018  ? Pes anserinus bursitis of left knee 10/31/2016  ? Allergic rhinitis 02/14/2016  ? Hearing loss 02/13/2016  ? CKD (chronic kidney disease) stage 3, GFR 30-59 ml/min (HCC) 02/13/2016  ? Atrial fibrillation ([place]) 02/07/2016  ? Paroxysmal atrial fibrillation ([place]) 03/31/2012  ? Hypertension 03/31/2012  ? Hemorrhoids, external 05/15/2011  ? ? ?PCP: Christain Sacramento, MD ? ?REFERRING PROVIDER: Christain Sacramento, MD ? ?REFERRING DIAG: S/P R TKE (uni)  ? ?THERAPY DIAG:  ?Acute pain of right knee ? ?Stiffness of right knee, not elsewhere classified ? ?Muscle weakness (generalized) ? ?ONSET DATE:  Surgery date: 09/11/21 R Uni TKA  ? ?SUBJECTIVE:  ? ?SUBJECTIVE STATEMENT: ? Pt did lots of walking over weekend, on uneven ground, states mild soreness in lateral lower leg/ankle, and in heel. Better today . ?  ? ?PERTINENT HISTORY: ?Chronic A-Fib,  ? ?PAIN:  ?Are you having pain? Yes: NPRS scale: 2/10 ?Pain location: R knee ?Pain description: achey ?Aggravating factors: increased activity  ?Relieving factors: Rest, ice  ? ? ?PRECAUTIONS: Knee ? ?WEIGHT BEARING RESTRICTIONS No ? ?FALLS:  ?Has patient fallen in last 6 months? No ? ? ?PLOF: Independent ? ?PATIENT GOALS  return to regular use of knee/leg, PLOF  ? ?OBJECTIVE:  ? ? ?COGNITION: ? Overall cognitive status: Within functional limits for tasks assessed   ?  ? ?PALPATION: ?R knee: healing incision, dressing in place. Mild swelling, wearing compression sock.  ? ? ?LE ROM:  Hip ROM: Mild limitation ? ?Passive ROM Right ?09/19/21 R ?09/23/21 R ?10/01/21 R ?10/17/21 R ?10/29/21  ?       ?       ?       ?       ?       ?        ?Knee flexion 93 112 113 110 120  ?Knee extension -3 -3   0  ?       ?       ?       ?       ? (Blank rows = not tested) ? ? ?LE MMT: ? Not tested  ? ?TODAY'S TREATMENT: ? ?10/29/2021 ?Therapeutic Exercise: ?Aerobic: recumbent bike L2 x 8 min;  ?Supine:   ?Seated:   LAQ 4 lb x 20;  ?Standing:  Step ups fwd 6 in x 10 on R; Stairs 5 steps recipricol, no UE support x 5; L/R weight shifts , standing on wedge for more EV. ?Stretches:  ?Neuromuscular Re-education: ?Manual Therapy:  PROM for R knee flex and ext in supine, and prone. Ankle mobs to inc DF and EV, Tib/fib mobs to inc tibial rotation ?Gait Training: ?Self Care:  placement of rearfoot wedge in R shoe, pt with good comfort today, will continue to  wear until next visit.  ? ? ? ?PATIENT EDUCATION:  ?Education details: reviewed HEP.  ?Person educated: Patient and Spouse ?Education method: Explanation, Demonstration, Tactile cues, Verbal cues, and Handouts ?Education comprehension: verbalized understanding, returned demonstration, verbal cues required, tactile cues required, and needs further education ? ? ?HOME EXERCISE PROGRAM: ?Access Code: [email or web address] ?Updated 10/17/21 ? ? ?ASSESSMENT: ? ?CLINICAL IMPRESSION: ?Pt progressing well with strength and ROM in knee. Continued work on mobilization for R ankle position today. Ankle does seem to be loosening up some in unweighted position, but  has more rearfoot INV in standing. Trial for rearfoot wedge today. Pt to benefit from continued care for strength, and ankle mobility, for improved gait mechanics. Likely d/c in 1-2 weeks.  ? ?OBJECTIVE IMPAIRMENTS Abnormal gait, decreased activity tolerance, decreased balance, decreased endurance, decreased knowledge of use of DME, decreased mobility, difficulty walking, decreased ROM, decreased strength, hypomobility, increased muscle spasms, impaired flexibility, and pain.  ? ?ACTIVITY LIMITATIONS cleaning, community activity, driving, meal prep, yard work, and shopping.   ? ?PERSONAL FACTORS  none  are also affecting patient's functional outcome.  ? ? ?REHAB POTENTIAL: Good ? ?CLINICAL DECISION MAKING: Stable/uncomplicated ? ?EVALUATION COMPLEXITY: Low ? ? ?GOALS: ?Goals reviewed with p

## 2021-10-31 ENCOUNTER — Telehealth: Payer: Self-pay | Admitting: Family Medicine

## 2021-10-31 ENCOUNTER — Ambulatory Visit (INDEPENDENT_AMBULATORY_CARE_PROVIDER_SITE_OTHER): Payer: Medicare Other | Admitting: Physical Therapy

## 2021-10-31 ENCOUNTER — Encounter: Payer: Self-pay | Admitting: Physical Therapy

## 2021-10-31 DIAGNOSIS — M25661 Stiffness of right knee, not elsewhere classified: Secondary | ICD-10-CM

## 2021-10-31 DIAGNOSIS — M25561 Pain in right knee: Secondary | ICD-10-CM

## 2021-10-31 DIAGNOSIS — M6281 Muscle weakness (generalized): Secondary | ICD-10-CM | POA: Diagnosis not present

## 2021-10-31 NOTE — Therapy (Signed)
OUTPATIENT PHYSICAL THERAPY TREATMENT   Patient Name: Gregory [name] MRN: [id number] DOB:[birth year], 81 y.o., male Today's Date: 10/31/2021    PT End of Session - 10/31/21 0856     Visit Number 13    Number of Visits 16    Date for PT Re-Evaluation 11/14/21    Authorization Type Medicare/ PN done at visit 10    PT Start Time 0852    PT Stop Time 0932    PT Time Calculation (min) 40 min    Activity Tolerance Patient tolerated treatment well    Behavior During Therapy [facility] for tasks assessed/performed                Past Medical History:  Diagnosis Date   Arthritis    "right knee" (01/28/2016)   Diverticulosis of colon (without mention of hemorrhage)    Dysrhythmia    Hyperlipidemia    Internal hemorrhoids without mention of complication    Personal history of colonic polyps 01/17/2002   adenomatous, tublar adenoma 02/10/2007   PNA (pneumonia) 03/31/2012   Past Surgical History:  Procedure Laterality Date   ATRIAL FIBRILLATION ABLATION  11/28/2016   ATRIAL FIBRILLATION ABLATION N/A 11/28/2016   Procedure: Atrial Fibrillation Ablation;  Surgeon: Constance Haw, MD;  Location: [place] CV LAB;  Service: Cardiovascular;  Laterality: N/A;   ATRIAL FIBRILLATION ABLATION N/A 03/18/2019   Procedure: ATRIAL FIBRILLATION ABLATION;  Surgeon: Constance Haw, MD;  Location: [place] CV LAB;  Service: Cardiovascular;  Laterality: N/A;   CARDIOVERSION  05/03/2012   Procedure: CARDIOVERSION;  Surgeon: Laverda Page, MD;  Location: [place];  Service: Cardiovascular;  Laterality: N/A;   CARDIOVERSION  06/08/2012   Procedure: CARDIOVERSION;  Surgeon: Laverda Page, MD;  Location: [place];  Service: Cardiovascular;  Laterality: N/A;  [name]   CARDIOVERSION N/A 10/28/2013   Procedure: CARDIOVERSION;  Surgeon: Laverda Page, MD;  Location: [place];  Service: Cardiovascular;  Laterality: N/A;   CARDIOVERSION N/A 06/30/2014   Procedure:  CARDIOVERSION;  Surgeon: Laverda Page, MD;  Location: [place];  Service: Cardiovascular;  Laterality: N/A;  H&P in file   CARDIOVERSION N/A 09/05/2014   Procedure: CARDIOVERSION;  Surgeon: Adrian Prows, MD;  Location: [place];  Service: Cardiovascular;  Laterality: N/A;   CARDIOVERSION N/A 12/24/2015   Procedure: CARDIOVERSION;  Surgeon: Adrian Prows, MD;  Location: [place];  Service: Cardiovascular;  Laterality: N/A;   CARDIOVERSION N/A 01/30/2016   Procedure: CARDIOVERSION;  Surgeon: Adrian Prows, MD;  Location: [place];  Service: Cardiovascular;  Laterality: N/A;   CARDIOVERSION N/A 02/08/2016   Procedure: CARDIOVERSION;  Surgeon: Adrian Prows, MD;  Location: [place];  Service: Cardiovascular;  Laterality: N/A;   CARDIOVERSION N/A 02/08/2019   Procedure: CARDIOVERSION;  Surgeon: Adrian Prows, MD;  Location: [place];  Service: Cardiovascular;  Laterality: N/A;   CARDIOVERSION N/A 05/24/2019   Procedure: CARDIOVERSION;  Surgeon: Elouise Munroe, MD;  Location: [facility] ENDOSCOPY;  Service: Cardiovascular;  Laterality: N/A;   CARDIOVERSION N/A 09/21/2019   Procedure: CARDIOVERSION;  Surgeon: Adrian Prows, MD;  Location: [facility] ENDOSCOPY;  Service: Cardiovascular;  Laterality: N/A;   CARDIOVERSION N/A 12/01/2019   Procedure: CARDIOVERSION;  Surgeon: Adrian Prows, MD;  Location: [facility] ENDOSCOPY;  Service: Cardiovascular;  Laterality: N/A;   CARDIOVERSION N/A 08/20/2021   Procedure: CARDIOVERSION;  Surgeon: Adrian Prows, MD;  Location: [facility] ENDOSCOPY;  Service: Cardiovascular;  Laterality: N/A;   CARDIOVERSION N/A 10/18/2021   Procedure: CARDIOVERSION;  Surgeon: Adrian Prows, MD;  Location:  [place] ENDOSCOPY;  Service: Cardiovascular;  Laterality: N/A;   CATARACT EXTRACTION W/ INTRAOCULAR LENS  IMPLANT, BILATERAL Bilateral    COLONOSCOPY     INGUINAL HERNIA REPAIR Bilateral    as child   KNEE ARTHROSCOPY Right 2004   Patient Active Problem List   Diagnosis Date Noted   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 01/12/2020   BPH with elevated PSA 12/13/2019   Dermatitis 12/13/2019   Encounter for Medicare annual wellness exam 12/13/2019   Chronic pain of right knee 10/10/2019   Atypical atrial flutter ([place]) 05/17/2019   Acquired thrombophilia ([place]) 05/17/2019   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/06/2018   Primary osteoarthritis of right knee 08/06/2018   Pes anserinus bursitis of left knee 10/31/2016   Allergic rhinitis 02/14/2016   Hearing loss 02/13/2016   CKD (chronic kidney disease) stage 3, GFR 30-59 ml/min (HCC) 02/13/2016   Atrial fibrillation ([place]) 02/07/2016   Paroxysmal atrial fibrillation ([place]) 03/31/2012   Hypertension 03/31/2012   Hemorrhoids, external 05/15/2011    PCP: Christain Sacramento, MD  REFERRING PROVIDER: Christain Sacramento, MD  REFERRING DIAG: S/P R TKE (uni)   THERAPY DIAG:  Acute pain of right knee  Stiffness of right knee, not elsewhere classified  Muscle weakness (generalized)  ONSET DATE:  Surgery date: 09/11/21 R Uni TKA   SUBJECTIVE:   SUBJECTIVE STATEMENT:  Pt with no new complaints. He is wearing lateral heel wedge without any discomfort.     PERTINENT HISTORY: Chronic A-Fib,   PAIN:  Are you having pain? Yes: NPRS scale: 0-2/10 Pain location: R knee Pain description: achey Aggravating factors: increased activity  Relieving factors: Rest, ice    PRECAUTIONS: Knee  WEIGHT BEARING RESTRICTIONS No  FALLS:  Has patient fallen in last 6 months? No   PLOF: Independent  PATIENT GOALS  return to regular use of knee/leg, PLOF   OBJECTIVE:    COGNITION:  Overall cognitive status: Within functional limits for tasks assessed      PALPATION: R knee: healing incision, dressing in place. Mild swelling, wearing compression sock.    LE ROM:  Hip ROM: Mild limitation  Passive ROM Right 09/19/21 R 09/23/21 R 10/01/21 R 10/17/21 R 10/29/21                                            Knee flexion 93 112 113 110 120  Knee  extension -3 -3   0                               (Blank rows = not tested)   LE MMT:  Not tested   TODAY'S TREATMENT:  10/31/2021 Therapeutic Exercise: Aerobic: recumbent bike L2 x 8 min;  Supine:  R ankle EV x 10, with RTB x 10 cuing for optimal activation Seated:   LAQ 4 lb x 20;  Standing:  Slow Marching no UE support x 20, with focus on TKE and stability of R LE,   Bwd walking (in hallway holding wall) for TKE and stability on R  10 ft x 6;  Stretches: at counter: [employer] stretch on R for DF glide x 15;  Neuromuscular Re-education: Manual Therapy:  PROM for R knee flex and ext in supine, and prone. Ankle mobs to inc DF and EV, Tib/fib mobs to  inc tibial rotation Gait Training: Self Care:     10/29/21 Therapeutic Exercise: Aerobic: recumbent bike L2 x 8 min;  Supine:   Seated:   LAQ 4 lb x 20;  Standing:  Step ups fwd 6 in x 10 on R; Stairs 5 steps recipricol, no UE support x 5;  L/R weight shifts , standing on medial wedge for more EV. Stretches:  Neuromuscular Re-education: Manual Therapy:  PROM for R knee flex and ext in supine, and prone. Ankle mobs to inc DF and EV, Tib/fib mobs to inc tibial rotation Gait Training: Self Care:  placement of rearfoot wedge in R shoe, pt with good comfort today, will continue to wear until next visit.     PATIENT EDUCATION:  Education details: reviewed HEP.  Person educated: Patient and Spouse Education method: Explanation, Demonstration, Tactile cues, Verbal cues, and Handouts Education comprehension: verbalized understanding, returned demonstration, verbal cues required, tactile cues required, and needs further education   HOME EXERCISE PROGRAM: Access Code: [id number] Updated 10/17/21   ASSESSMENT:  CLINICAL IMPRESSION: Pt progressing well with strength and ROM in knee. Continued work on mobilization for R ankle position today.  Pt to benefit from continued care for Quad and LE strength, and ankle mobility.  Plan to see pt  1x/wk for 2-3 more weeks for continued work on knee an ankle.   OBJECTIVE IMPAIRMENTS Abnormal gait, decreased activity tolerance, decreased balance, decreased endurance, decreased knowledge of use of DME, decreased mobility, difficulty walking, decreased ROM, decreased strength, hypomobility, increased muscle spasms, impaired flexibility, and pain.   ACTIVITY LIMITATIONS cleaning, community activity, driving, meal prep, yard work, and shopping.   PERSONAL FACTORS  none  are also affecting patient's functional outcome.    REHAB POTENTIAL: Good  CLINICAL DECISION MAKING: Stable/uncomplicated  EVALUATION COMPLEXITY: Low   GOALS: Goals reviewed with patient? Yes  SHORT TERM GOALS: Target date: 10/03/2021  Pt to be independent with initial HEP Goal status: INITIAL  2.  Pt to demo improved knee flexion by at least 15 degrees  Goal status: INITIAL  3.  Pt to report pain levels 0-3/10 with activity.  Goal status: INITIAL    LONG TERM GOALS: Target date: 11/14/2021  Pt to be independent with final HEP Goal status: INITIAL  2.  Pt to report decreased pain in R knee to 0-2/10 with activity  Goal status: INITIAL  3.  Pt to demo improved R knee ROM to be WNL for flex and extension  Baseline: -3 to 93 deg  Goal status: INITIAL  4.  Pt to demo independent ambulation with LRAD, for at least 500 ft , to improve ability for community navigation.  Goal status: INITIAL  5.  Pt to demo independence with stair navigation with 1 HR or less, for at least 5 steps, to improve ability for community and home navigation.  Goal status: INITIAL    PLAN: PT FREQUENCY:  2-3 x/week  PT DURATION: 8 weeks  PLANNED INTERVENTIONS: Therapeutic exercises, Therapeutic activity, Neuromuscular re-education, Balance training, Gait training, Patient/Family education, Joint mobilization, Stair training, DME instructions, Dry Needling, Electrical stimulation, Spinal manipulation, Spinal mobilization,  Cryotherapy, Moist heat, Taping, Vasopneumatic device, Ultrasound, Ionotophoresis 4mg /ml Dexamethasone, and Manual therapy  PLAN FOR NEXT SESSION:   Lyndee Hensen, PT, DPT 10:01 AM  10/31/21

## 2021-10-31 NOTE — Telephone Encounter (Signed)
Pt states he understood that his knee is doing well, but foot will continue to need attention.  Pt states he needs clarification regarding which exercises should be continued vs stopped.  Please call back.

## 2021-11-05 ENCOUNTER — Encounter: Payer: Self-pay | Admitting: Physical Therapy

## 2021-11-05 ENCOUNTER — Ambulatory Visit (INDEPENDENT_AMBULATORY_CARE_PROVIDER_SITE_OTHER): Payer: Medicare Other | Admitting: Physical Therapy

## 2021-11-05 DIAGNOSIS — M6281 Muscle weakness (generalized): Secondary | ICD-10-CM

## 2021-11-05 DIAGNOSIS — M25661 Stiffness of right knee, not elsewhere classified: Secondary | ICD-10-CM | POA: Diagnosis not present

## 2021-11-05 DIAGNOSIS — M25561 Pain in right knee: Secondary | ICD-10-CM

## 2021-11-05 NOTE — Therapy (Signed)
OUTPATIENT PHYSICAL THERAPY TREATMENT   Patient Name: Gregory [name] MRN: [id number] DOB:1940/06/17, 81 y.o., male Today's Date: 11/05/2021    PT End of Session - 11/05/21 1015     Visit Number 14    Number of Visits 16    Date for PT Re-Evaluation 11/14/21    Authorization Type Medicare/ PN done at visit 10    PT Start Time 1015    PT Stop Time 1055    PT Time Calculation (min) 40 min    Activity Tolerance Patient tolerated treatment well    Behavior During Therapy [facility] for tasks assessed/performed                Past Medical History:  Diagnosis Date   Arthritis    "right knee" (01/28/2016)   Diverticulosis of colon (without mention of hemorrhage)    Dysrhythmia    Hyperlipidemia    Internal hemorrhoids without mention of complication    Personal history of colonic polyps 01/17/2002   adenomatous, tublar adenoma 02/10/2007   PNA (pneumonia) 03/31/2012   Past Surgical History:  Procedure Laterality Date   ATRIAL FIBRILLATION ABLATION  11/28/2016   ATRIAL FIBRILLATION ABLATION N/A 11/28/2016   Procedure: Atrial Fibrillation Ablation;  Surgeon: Constance Haw, MD;  Location: [place] CV [facility];  Service: Cardiovascular;  Laterality: N/A;   ATRIAL FIBRILLATION ABLATION N/A 03/18/2019   Procedure: ATRIAL FIBRILLATION ABLATION;  Surgeon: Constance Haw, MD;  Location: [place] [facility];  Service: Cardiovascular;  Laterality: N/A;   CARDIOVERSION  05/03/2012   Procedure: CARDIOVERSION;  Surgeon: Laverda Page, MD;  Location: [place];  Service: Cardiovascular;  Laterality: N/A;   CARDIOVERSION  06/08/2012   Procedure: CARDIOVERSION;  Surgeon: Laverda Page, MD;  Location: [place];  Service: Cardiovascular;  Laterality: N/A;  [name]   CARDIOVERSION N/A 10/28/2013   Procedure: CARDIOVERSION;  Surgeon: Laverda Page, MD;  Location: [place];  Service: Cardiovascular;  Laterality: N/A;   CARDIOVERSION N/A 06/30/2014   Procedure:  CARDIOVERSION;  Surgeon: Laverda Page, MD;  Location: [place];  Service: Cardiovascular;  Laterality: N/A;  H&P in file   CARDIOVERSION N/A 09/05/2014   Procedure: CARDIOVERSION;  Surgeon: Adrian Prows, MD;  Location: [place];  Service: Cardiovascular;  Laterality: N/A;   CARDIOVERSION N/A 12/24/2015   Procedure: CARDIOVERSION;  Surgeon: Adrian Prows, MD;  Location: [place];  Service: Cardiovascular;  Laterality: N/A;   CARDIOVERSION N/A 01/30/2016   Procedure: CARDIOVERSION;  Surgeon: Adrian Prows, MD;  Location: [place];  Service: Cardiovascular;  Laterality: N/A;   CARDIOVERSION N/A 02/08/2016   Procedure: CARDIOVERSION;  Surgeon: Adrian Prows, MD;  Location: [place];  Service: Cardiovascular;  Laterality: N/A;   CARDIOVERSION N/A 02/08/2019   Procedure: CARDIOVERSION;  Surgeon: Adrian Prows, MD;  Location: [place];  Service: Cardiovascular;  Laterality: N/A;   CARDIOVERSION N/A 05/24/2019   Procedure: CARDIOVERSION;  Surgeon: Elouise Munroe, MD;  Location: [facility] ENDOSCOPY;  Service: Cardiovascular;  Laterality: N/A;   CARDIOVERSION N/A 09/21/2019   Procedure: CARDIOVERSION;  Surgeon: Adrian Prows, MD;  Location: [facility] ENDOSCOPY;  Service: Cardiovascular;  Laterality: N/A;   CARDIOVERSION N/A 12/01/2019   Procedure: CARDIOVERSION;  Surgeon: Adrian Prows, MD;  Location: [facility] ENDOSCOPY;  Service: Cardiovascular;  Laterality: N/A;   CARDIOVERSION N/A 08/20/2021   Procedure: CARDIOVERSION;  Surgeon: Adrian Prows, MD;  Location: [facility] ENDOSCOPY;  Service: Cardiovascular;  Laterality: N/A;   CARDIOVERSION N/A 10/18/2021   Procedure: CARDIOVERSION;  Surgeon: Adrian Prows, MD;  Location:  [place] ENDOSCOPY;  Service: Cardiovascular;  Laterality: N/A;   CATARACT EXTRACTION W/ INTRAOCULAR LENS  IMPLANT, BILATERAL Bilateral    COLONOSCOPY     INGUINAL HERNIA REPAIR Bilateral    as child   KNEE ARTHROSCOPY Right 2004   Patient Active Problem List   Diagnosis Date Noted   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 01/12/2020   BPH with elevated PSA 12/13/2019   Dermatitis 12/13/2019   Encounter for Medicare annual wellness exam 12/13/2019   Chronic pain of right knee 10/10/2019   Atypical atrial flutter ([place]) 05/17/2019   Acquired thrombophilia ([place]) 05/17/2019   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/06/2018   Primary osteoarthritis of right knee 08/06/2018   Pes anserinus bursitis of left knee 10/31/2016   Allergic rhinitis 02/14/2016   Hearing loss 02/13/2016   CKD (chronic kidney disease) stage 3, GFR 30-59 ml/min (HCC) 02/13/2016   Atrial fibrillation ([place]) 02/07/2016   Paroxysmal atrial fibrillation ([place]) 03/31/2012   Hypertension 03/31/2012   Hemorrhoids, external 05/15/2011    PCP: Christain Sacramento, MD  REFERRING PROVIDER: Christain Sacramento, MD  REFERRING DIAG: S/P R TKE (uni)   THERAPY DIAG:  Acute pain of right knee  Stiffness of right knee, not elsewhere classified  Muscle weakness (generalized)  ONSET DATE:  Surgery date: 09/11/21 R Uni TKA   SUBJECTIVE:   SUBJECTIVE STATEMENT: States his knee is bothering him a little but he did a lot around the yard yesterday. States he has been wearing the wedge in the shoe and it seems like it is doing what it is supposed to and is comfortable    PERTINENT HISTORY: Chronic A-Fib,   PAIN:  Are you having pain? Yes: NPRS scale: 1/10 Pain location: R knee Pain description: achey Aggravating factors: increased activity  Relieving factors: Rest, ice    PRECAUTIONS: Knee  WEIGHT BEARING RESTRICTIONS No  FALLS:  Has patient fallen in last 6 months? No   PLOF: Independent  PATIENT GOALS  return to regular use of knee/leg, PLOF   OBJECTIVE:    COGNITION:  Overall cognitive status: Within functional limits for tasks assessed      PALPATION: R knee: healing incision, dressing in place. Mild swelling, wearing compression sock.    LE ROM:  Hip ROM: Mild limitation  Passive ROM Right 09/19/21 R 09/23/21 R 10/01/21  R 10/17/21 R 10/29/21                                            Knee flexion 93 112 113 110 120  Knee extension -3 -3   0                               (Blank rows = not tested)   LE MMT:  Not tested   TODAY'S TREATMENT: 11/05/2021 Therapeutic Exercise: Aerobic: recumbent bike L2 x 4 min;  Standing:  gastroc stretch at steps x3 30" holds B, soleus stretch at steps x1 stopped pinching on front of ankle - tolerated better after mobilization x5 10" holds bilateral  Neuromuscular Re-education: tandem on foam SBA 5 minutes, tandem on floor -focus on press on medial side of foot - verbal and tactile cues - 5 minutes, squat with focus on keeping toes clued to floor and pressing through inside of foot 4x5, Tandem walking  with min assist 6 minutes total Manual Therapy:  TCJ mobilization grade II with tibial translation in kneeling position - 5 minutes Gait Training: Self Care:     10/29/21 Therapeutic Exercise: Aerobic: recumbent bike L2 x 8 min;  Supine:   Seated:   LAQ 4 lb x 20;  Standing:  Step ups fwd 6 in x 10 on R; Stairs 5 steps recipricol, no UE support x 5;  L/R weight shifts , standing on medial wedge for more EV. Stretches:  Neuromuscular Re-education: Manual Therapy:  PROM for R knee flex and ext in supine, and prone. Ankle mobs to inc DF and EV, Tib/fib mobs to inc tibial rotation Gait Training: Self Care:  placement of rearfoot wedge in R shoe, pt with good comfort today, will continue to wear until next visit.     PATIENT EDUCATION:  Education details: reviewed HEP.  Person educated: Patient and Spouse Education method: Explanation, Demonstration, Tactile cues, Verbal cues, and Handouts Education comprehension: verbalized understanding, returned demonstration, verbal cues required, tactile cues required, and needs further education   HOME EXERCISE PROGRAM: Access Code: [email or web address] Updated 10/17/21   ASSESSMENT:  CLINICAL IMPRESSION: Session focused on  stability of ankle. Patient with tendency to supinate the ankle. Focus was on pressing along medial side of foot with standing exercises and walking. Patient reports instability with this. Educated patient how he tends to lock out his foot in supination which is why he feels this is stable. No pain noted during session. Added static exercises to HEP. Will continue with current POC  OBJECTIVE IMPAIRMENTS Abnormal gait, decreased activity tolerance, decreased balance, decreased endurance, decreased knowledge of use of DME, decreased mobility, difficulty walking, decreased ROM, decreased strength, hypomobility, increased muscle spasms, impaired flexibility, and pain.   ACTIVITY LIMITATIONS cleaning, community activity, driving, meal prep, yard work, and shopping.   PERSONAL FACTORS  none  are also affecting patient's functional outcome.    REHAB POTENTIAL: Good  CLINICAL DECISION MAKING: Stable/uncomplicated  EVALUATION COMPLEXITY: Low   GOALS: Goals reviewed with patient? Yes  SHORT TERM GOALS: Target date: 10/03/2021  Pt to be independent with initial HEP Goal status: INITIAL  2.  Pt to demo improved knee flexion by at least 15 degrees  Goal status: INITIAL  3.  Pt to report pain levels 0-3/10 with activity.  Goal status: INITIAL    LONG TERM GOALS: Target date: 11/14/2021  Pt to be independent with final HEP Goal status: INITIAL  2.  Pt to report decreased pain in R knee to 0-2/10 with activity  Goal status: INITIAL  3.  Pt to demo improved R knee ROM to be WNL for flex and extension  Baseline: -3 to 93 deg  Goal status: INITIAL  4.  Pt to demo independent ambulation with LRAD, for at least 500 ft , to improve ability for community navigation.  Goal status: INITIAL  5.  Pt to demo independence with stair navigation with 1 HR or less, for at least 5 steps, to improve ability for community and home navigation.  Goal status: INITIAL    PLAN: PT FREQUENCY:  2-3  x/week  PT DURATION: 8 weeks  PLANNED INTERVENTIONS: Therapeutic exercises, Therapeutic activity, Neuromuscular re-education, Balance training, Gait training, Patient/Family education, Joint mobilization, Stair training, DME instructions, Dry Needling, Electrical stimulation, Spinal manipulation, Spinal mobilization, Cryotherapy, Moist heat, Taping, Vasopneumatic device, Ultrasound, Ionotophoresis 4mg /ml Dexamethasone, and Manual therapy  PLAN FOR NEXT SESSION:   11:02 AM, 11/05/21 Jerene Pitch, DPT Physical Therapy with [facility] S[facility]

## 2021-11-13 ENCOUNTER — Ambulatory Visit (INDEPENDENT_AMBULATORY_CARE_PROVIDER_SITE_OTHER): Payer: Medicare Other | Admitting: Physical Therapy

## 2021-11-13 ENCOUNTER — Encounter: Payer: Self-pay | Admitting: Physical Therapy

## 2021-11-13 DIAGNOSIS — M25661 Stiffness of right knee, not elsewhere classified: Secondary | ICD-10-CM | POA: Diagnosis not present

## 2021-11-13 DIAGNOSIS — M6281 Muscle weakness (generalized): Secondary | ICD-10-CM

## 2021-11-13 DIAGNOSIS — M25561 Pain in right knee: Secondary | ICD-10-CM | POA: Diagnosis not present

## 2021-11-13 NOTE — Therapy (Addendum)
OUTPATIENT PHYSICAL THERAPY TREATMENT/DISCHARGE   Patient Name: Gregory [name] MRN: [id number] DOB:January 13, 1941, 81 y.o., male Today's Date: 11/13/2021    PT End of Session - 11/13/21 1116     Visit Number 15    Number of Visits 16    Date for PT Re-Evaluation 11/14/21    Authorization Type Medicare/ PN done at visit 10    PT Start Time 1105    PT Stop Time 1136    PT Time Calculation (min) 31 min    Activity Tolerance Patient tolerated treatment well    Behavior During Therapy [facility] for tasks assessed/performed                Past Medical History:  Diagnosis Date   Arthritis    "right knee" (01/28/2016)   Diverticulosis of colon (without mention of hemorrhage)    Dysrhythmia    Hyperlipidemia    Internal hemorrhoids without mention of complication    Personal history of colonic polyps 01/17/2002   adenomatous, tublar adenoma 02/10/2007   PNA (pneumonia) 03/31/2012   Past Surgical History:  Procedure Laterality Date   ATRIAL FIBRILLATION ABLATION  11/28/2016   ATRIAL FIBRILLATION ABLATION N/A 11/28/2016   Procedure: Atrial Fibrillation Ablation;  Surgeon: Constance Haw, MD;  Location: [place] [facility];  Service: Cardiovascular;  Laterality: N/A;   ATRIAL FIBRILLATION ABLATION N/A 03/18/2019   Procedure: ATRIAL FIBRILLATION ABLATION;  Surgeon: Constance Haw, MD;  Location: [place] CV LAB;  Service: Cardiovascular;  Laterality: N/A;   CARDIOVERSION  05/03/2012   Procedure: CARDIOVERSION;  Surgeon: Laverda Page, MD;  Location: [place];  Service: Cardiovascular;  Laterality: N/A;   CARDIOVERSION  06/08/2012   Procedure: CARDIOVERSION;  Surgeon: Laverda Page, MD;  Location: [place];  Service: Cardiovascular;  Laterality: N/A;  [name]   CARDIOVERSION N/A 10/28/2013   Procedure: CARDIOVERSION;  Surgeon: Laverda Page, MD;  Location: [place];  Service: Cardiovascular;  Laterality: N/A;   CARDIOVERSION N/A 06/30/2014    Procedure: CARDIOVERSION;  Surgeon: Laverda Page, MD;  Location: [place];  Service: Cardiovascular;  Laterality: N/A;  H&P in file   CARDIOVERSION N/A 09/05/2014   Procedure: CARDIOVERSION;  Surgeon: Adrian Prows, MD;  Location: [place];  Service: Cardiovascular;  Laterality: N/A;   CARDIOVERSION N/A 12/24/2015   Procedure: CARDIOVERSION;  Surgeon: Adrian Prows, MD;  Location: [place];  Service: Cardiovascular;  Laterality: N/A;   CARDIOVERSION N/A 01/30/2016   Procedure: CARDIOVERSION;  Surgeon: Adrian Prows, MD;  Location: [place];  Service: Cardiovascular;  Laterality: N/A;   CARDIOVERSION N/A 02/08/2016   Procedure: CARDIOVERSION;  Surgeon: Adrian Prows, MD;  Location: [place];  Service: Cardiovascular;  Laterality: N/A;   CARDIOVERSION N/A 02/08/2019   Procedure: CARDIOVERSION;  Surgeon: Adrian Prows, MD;  Location: [place];  Service: Cardiovascular;  Laterality: N/A;   CARDIOVERSION N/A 05/24/2019   Procedure: CARDIOVERSION;  Surgeon: Elouise Munroe, MD;  Location: [facility] ENDOSCOPY;  Service: Cardiovascular;  Laterality: N/A;   CARDIOVERSION N/A 09/21/2019   Procedure: CARDIOVERSION;  Surgeon: Adrian Prows, MD;  Location: [facility] ENDOSCOPY;  Service: Cardiovascular;  Laterality: N/A;   CARDIOVERSION N/A 12/01/2019   Procedure: CARDIOVERSION;  Surgeon: Adrian Prows, MD;  Location: [facility] ENDOSCOPY;  Service: Cardiovascular;  Laterality: N/A;   CARDIOVERSION N/A 08/20/2021   Procedure: CARDIOVERSION;  Surgeon: Adrian Prows, MD;  Location: [facility] ENDOSCOPY;  Service: Cardiovascular;  Laterality: N/A;   CARDIOVERSION N/A 10/18/2021   Procedure: CARDIOVERSION;  Surgeon: Adrian Prows, MD;  Location:  [place] ENDOSCOPY;  Service: Cardiovascular;  Laterality: N/A;   CATARACT EXTRACTION W/ INTRAOCULAR LENS  IMPLANT, BILATERAL Bilateral    COLONOSCOPY     INGUINAL HERNIA REPAIR Bilateral    as child   KNEE ARTHROSCOPY Right 2004   Patient Active Problem List   Diagnosis Date Noted   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 01/12/2020   BPH with elevated PSA 12/13/2019   Dermatitis 12/13/2019   Encounter for Medicare annual wellness exam 12/13/2019   Chronic pain of right knee 10/10/2019   Atypical atrial flutter ([place]) 05/17/2019   Acquired thrombophilia ([place]) 05/17/2019   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/06/2018   Primary osteoarthritis of right knee 08/06/2018   Pes anserinus bursitis of left knee 10/31/2016   Allergic rhinitis 02/14/2016   Hearing loss 02/13/2016   CKD (chronic kidney disease) stage 3, GFR 30-59 ml/min (HCC) 02/13/2016   Atrial fibrillation ([place]) 02/07/2016   Paroxysmal atrial fibrillation ([place]) 03/31/2012   Hypertension 03/31/2012   Hemorrhoids, external 05/15/2011    PCP: Christain Sacramento, MD  REFERRING PROVIDER: Christain Sacramento, MD  REFERRING DIAG: S/P R TKE (uni)   THERAPY DIAG:  Acute pain of right knee  Stiffness of right knee, not elsewhere classified  Muscle weakness (generalized)  ONSET DATE:  Surgery date: 09/11/21 R Uni TKA   SUBJECTIVE:   SUBJECTIVE STATEMENT: Pt states doing very well. Did have mild soreness in knee, after walking a while on the beach over the weekend. Otherwise, reporting no pain in knee on daily basis.     PERTINENT HISTORY: Chronic A-Fib,   PAIN:  Are you having pain? Yes: NPRS scale: 1/10 Pain location: R knee Pain description: achey Aggravating factors: increased activity  Relieving factors: Rest, ice    PRECAUTIONS: Knee  WEIGHT BEARING RESTRICTIONS No  FALLS:  Has patient fallen in last 6 months? No   PLOF: Independent  PATIENT GOALS  return to regular use of knee/leg, PLOF   OBJECTIVE:    COGNITION:  Overall cognitive status: Within functional limits for tasks assessed      PALPATION: R knee: healing incision, dressing in place. Mild swelling, wearing compression sock.    LE ROM:  Hip ROM: Mild limitation  Passive ROM Right 09/19/21 R 09/23/21 R 10/01/21 R 10/17/21 R 10/29/21  R 11/13/21                                                  Knee flexion 93 112 113 110 120 120  Knee extension -3 -3   0 0                                   (Blank rows = not tested)   LE MMT:  Not tested   TODAY'S TREATMENT: 11/13/2021 Therapeutic Exercise: Aerobic:  Supine:  SLR x 15 bil; heel slides x 15 on R; SKTC for knee flexion stretch x 1 min;  reviewed quad sets.  Seated:   LAQ 4 lb x 20 on R;  Standing:  L/R and staggered stance weight shifts x20 ea with focus on neutral heel and foot position Stretches: Reviewed gastroc stretches. Neuromuscular Re-education: Manual Therapy:  Self Care:  education and discussion on rearfoot wedge in R shoe, exercises to do and  to avoid at gym, review of final HEP and importance of continuing for optimal outcome with R knee.    PATIENT EDUCATION:  Education details: reviewed final HEP.  Person educated: Patient and Spouse Education method: Explanation, Demonstration, Tactile cues, Verbal cues, and Handouts Education comprehension: verbalized understanding, returned demonstration, verbal cues required, tactile cues required, and needs further education   HOME EXERCISE PROGRAM: Access Code: [email or web address] Updated 11/13/21   ASSESSMENT:  CLINICAL IMPRESSION: Pt doing very well at this time. He has met all goals for TKA. He has maintained optimal ROM, and has improved strength. He continues to have some stiffness in bil ankles R>L in rearfoot and for DF. He is wearing rearfoot wedge with good comfort. It has not changed foot posture significantly, but will continue to wear. Discussed importance of continuing ankle mobility with HEP for LE/gait mechanics. Pt has gait mechanics that are WFL at this time, despite mild rearfoot varus. He is not having any pain in ankle and has had positive outcome from recent foot surgery. Pt ready for d/c at this time. Final HEP reviewed in detail today. Pt in agreement with plan.   OBJECTIVE IMPAIRMENTS  Abnormal gait, decreased activity tolerance, decreased balance, decreased endurance, decreased knowledge of use of DME, decreased mobility, difficulty walking, decreased ROM, decreased strength, hypomobility, increased muscle spasms, impaired flexibility, and pain.   ACTIVITY LIMITATIONS cleaning, community activity, driving, meal prep, yard work, and shopping.   PERSONAL FACTORS  none  are also affecting patient's functional outcome.    REHAB POTENTIAL: Good  CLINICAL DECISION MAKING: Stable/uncomplicated  EVALUATION COMPLEXITY: Low   GOALS: Goals reviewed with patient? Yes  SHORT TERM GOALS: Target date: 10/03/2021  Pt to be independent with initial HEP Goal status: MET  2.  Pt to demo improved knee flexion by at least 15 degrees  Goal status: MET  3.  Pt to report pain levels 0-3/10 with activity.  Goal status: MET    LONG TERM GOALS: Target date: 11/14/2021  Pt to be independent with final HEP Goal status: MET  2.  Pt to report decreased pain in R knee to 0-2/10 with activity  Goal status: MET  3.  Pt to demo improved R knee ROM to be WNL for flex and extension  Baseline: -3 to 93 deg  Goal status: MET  4.  Pt to demo independent ambulation with LRAD, for at least 500 ft , to improve ability for community navigation.  Goal status: MET  5.  Pt to demo independence with stair navigation with 1 HR or less, for at least 5 steps, to improve ability for community and home navigation.  Goal status: MET    PLAN: PT FREQUENCY:  2-3 x/week  PT DURATION: 8 weeks  PLANNED INTERVENTIONS: Therapeutic exercises, Therapeutic activity, Neuromuscular re-education, Balance training, Gait training, Patient/Family education, Joint mobilization, Stair training, DME instructions, Dry Needling, Electrical stimulation, Spinal manipulation, Spinal mobilization, Cryotherapy, Moist heat, Taping, Vasopneumatic device, Ultrasound, Ionotophoresis 27m/ml Dexamethasone, and Manual  therapy  PLAN FOR NEXT SESSION:   LLyndee Hensen PT, DPT 12:16 PM  11/13/21     PHYSICAL THERAPY DISCHARGE SUMMARY  Visits from Start of Care: 15  Plan: Patient agrees to discharge.  Patient goals were  met. Patient is being discharged due to meeting the stated rehab goals.      LLyndee Hensen PT, DPT 12:16 PM  11/13/21

## 2021-11-26 ENCOUNTER — Encounter: Payer: Medicare Other | Admitting: Physical Therapy

## 2021-11-26 ENCOUNTER — Encounter: Payer: Self-pay | Admitting: Cardiology

## 2021-11-26 NOTE — Telephone Encounter (Signed)
From patient.

## 2021-11-27 NOTE — Telephone Encounter (Signed)
Recommend sooner appt given patient's symptoms. Please set him up to see JG or CC  in the next 1-2 weeks.

## 2021-11-27 NOTE — Telephone Encounter (Signed)
Please set him up to see JG or CC  in the next 1-2 weeks per [facility]

## 2021-12-03 ENCOUNTER — Encounter: Payer: Medicare Other | Admitting: Physical Therapy

## 2021-12-04 NOTE — Progress Notes (Unsigned)
Primary Physician/Referring:  Christain Sacramento, MD  Patient ID: Gregory [name], male    DOB: 02/05/1941, 81 y.o.   MRN: [id number]  No chief complaint on file.  HPI:    Gregory [name]  is a 81 y.o. Caucasian male with  history of stage 3b CKD, hypertension, mild hyperlipidemia and moderate CAD by coronary CTA, paroxysmal atrial fibrillation. He has history of multiple cardioversions and eventually dofetilide was discontinued by Dr. [name] due to efficacy, and was placed on  Multaq and has maintained sinus until Dec 2018. Due to recurrence of atrial fibrillation, he underwent 2nd ablation on 02/22/2019, 1st ablation being on 11/28/2016.  He had repeat cardioversion on 05/24/2019 and after Tikosyn initiation had cardioversion on 12/01/2019.   He is tolerating Tikosyn without any side effects.  He has had occasional episodes of atrial fibrillation. Otherwise he is presently doing well. No bleeding diathesis on Eliquis. No dizziness or syncope. Continues to remain active.  Past Medical History:  Diagnosis Date   Arthritis    "right knee" (01/28/2016)   Diverticulosis of colon (without mention of hemorrhage)    Dysrhythmia    Hyperlipidemia    Internal hemorrhoids without mention of complication    Personal history of colonic polyps 01/17/2002   adenomatous, tublar adenoma 02/10/2007   PNA (pneumonia) 03/31/2012   Past Surgical History:  Procedure Laterality Date   ATRIAL FIBRILLATION ABLATION  11/28/2016   ATRIAL FIBRILLATION ABLATION N/A 11/28/2016   Procedure: Atrial Fibrillation Ablation;  Surgeon: Constance Haw, MD;  Location: [place] [facility];  Service: Cardiovascular;  Laterality: N/A;   ATRIAL FIBRILLATION ABLATION N/A 03/18/2019   Procedure: ATRIAL FIBRILLATION ABLATION;  Surgeon: Constance Haw, MD;  Location: [place] [facility];  Service: Cardiovascular;  Laterality: N/A;   CARDIOVERSION  05/03/2012   Procedure: CARDIOVERSION;  Surgeon: Laverda Page, MD;   Location: [place];  Service: Cardiovascular;  Laterality: N/A;   CARDIOVERSION  06/08/2012   Procedure: CARDIOVERSION;  Surgeon: Laverda Page, MD;  Location: [place];  Service: Cardiovascular;  Laterality: N/A;  [name]   CARDIOVERSION N/A 10/28/2013   Procedure: CARDIOVERSION;  Surgeon: Laverda Page, MD;  Location: [place];  Service: Cardiovascular;  Laterality: N/A;   CARDIOVERSION N/A 06/30/2014   Procedure: CARDIOVERSION;  Surgeon: Laverda Page, MD;  Location: [place];  Service: Cardiovascular;  Laterality: N/A;  H&P in file   CARDIOVERSION N/A 09/05/2014   Procedure: CARDIOVERSION;  Surgeon: Adrian Prows, MD;  Location: [place];  Service: Cardiovascular;  Laterality: N/A;   CARDIOVERSION N/A 12/24/2015   Procedure: CARDIOVERSION;  Surgeon: Adrian Prows, MD;  Location: [place];  Service: Cardiovascular;  Laterality: N/A;   CARDIOVERSION N/A 01/30/2016   Procedure: CARDIOVERSION;  Surgeon: Adrian Prows, MD;  Location: [place];  Service: Cardiovascular;  Laterality: N/A;   CARDIOVERSION N/A 02/08/2016   Procedure: CARDIOVERSION;  Surgeon: Adrian Prows, MD;  Location: [facility] ENDOSCOPY;  Service: Cardiovascular;  Laterality: N/A;   CARDIOVERSION N/A 02/08/2019   Procedure: CARDIOVERSION;  Surgeon: Adrian Prows, MD;  Location: [place];  Service: Cardiovascular;  Laterality: N/A;   CARDIOVERSION N/A 05/24/2019   Procedure: CARDIOVERSION;  Surgeon: Elouise Munroe, MD;  Location: [facility] ENDOSCOPY;  Service: Cardiovascular;  Laterality: N/A;   CARDIOVERSION N/A 09/21/2019   Procedure: CARDIOVERSION;  Surgeon: Adrian Prows, MD;  Location: [facility] ENDOSCOPY;  Service: Cardiovascular;  Laterality: N/A;   CARDIOVERSION N/A 12/01/2019   Procedure: CARDIOVERSION;  Surgeon: Adrian Prows, MD;  Location: [facility]  ENDOSCOPY;  Service: Cardiovascular;  Laterality: N/A;   CARDIOVERSION N/A 08/20/2021   Procedure: CARDIOVERSION;  Surgeon: Ganji, Jay, MD;  Location: [facility] ENDOSCOPY;  Service:  Cardiovascular;  Laterality: N/A;   CARDIOVERSION N/A 10/18/2021   Procedure: CARDIOVERSION;  Surgeon: Ganji, Jay, MD;  Location: [facility] ENDOSCOPY;  Service: Cardiovascular;  Laterality: N/A;   CATARACT EXTRACTION W/ INTRAOCULAR LENS  IMPLANT, BILATERAL Bilateral    COLONOSCOPY     INGUINAL HERNIA REPAIR Bilateral    as child   KNEE ARTHROSCOPY Right 2004   Family History  Problem Relation Age of Onset   Cancer Mother        bladder   Bladder Cancer Mother    Heart disease Father    Stroke Father    Colon cancer Neg Hx    Stomach cancer Neg Hx    Esophageal cancer Neg Hx    Liver cancer Neg Hx    Pancreatic cancer Neg Hx     Social History   Tobacco Use   Smoking status: Never   Smokeless tobacco: Never  Substance Use Topics   Alcohol use: Yes    Alcohol/week: 1.0 standard drink of alcohol    Types: 1 Glasses of wine per week    Comment: daily   Marital Status: Married  ROS  Review of Systems  Constitutional: Negative for weight gain.  Cardiovascular:  Negative for dyspnea on exertion, leg swelling and syncope.  Respiratory:  Negative for shortness of breath.   Musculoskeletal:  Positive for joint pain (Right knee arthritis). Negative for joint swelling.   Objective  There were no vitals taken for this visit.     10/18/2021    8:16 AM 10/18/2021    8:15 AM 10/18/2021    8:14 AM  Vitals with BMI  Systolic 102 96   Diastolic 60 56   Pulse 66 63 64     Physical Exam Constitutional:      General: He is not in acute distress.    Appearance: He is well-developed.  Neck:     Vascular: No carotid bruit or JVD.  Cardiovascular:     Rate and Rhythm: Regular rhythm. Bradycardia present.     Pulses: Intact distal pulses.     Heart sounds: No murmur heard.    No gallop.  Pulmonary:     Effort: Pulmonary effort is normal. No accessory muscle usage or respiratory distress.     Breath sounds: Normal breath sounds.  Abdominal:     Palpations: Abdomen is soft.  Musculoskeletal:         General: Signs of injury (Right foot in cast) present.     Right lower leg: No edema.     Left lower leg: No edema.    Laboratory examination:   Recent Labs    01/04/21 1010 08/19/21 1026 10/08/21 1344  NA 144 140 142  K 4.7 4.2 4.8  CL 104 103 105  CO2 27 23 19*  GLUCOSE 113* 109* 108*  BUN 22 28* 25  CREATININE 1.44* 1.61* 1.87*  CALCIUM 9.4 9.4 9.0    CrCl cannot be calculated (Patient's most recent lab result is older than the maximum 21 days allowed.).     Latest Ref Rng & Units 10/08/2021    1:44 PM 08/19/2021   10:26 AM 01/04/2021   10:10 AM  CMP  Glucose 70 - 99 mg/dL 108  109  113   BUN 8 - 27 mg/dL 25  28  22   Creatinine 0.76 -   1.27 mg/dL 1.87  1.61  1.44   Sodium 134 - 144 mmol/L 142  140  144   Potassium 3.5 - 5.2 mmol/L 4.8  4.2  4.7   Chloride 96 - 106 mmol/L 105  103  104   CO2 20 - 29 mmol/L 19  23  27   Calcium 8.6 - 10.2 mg/dL 9.0  9.4  9.4   Total Protein 6.0 - 8.5 g/dL   6.4   Total Bilirubin 0.0 - 1.2 mg/dL   0.5   Alkaline Phos 44 - 121 IU/L   55   AST 0 - 40 IU/L   15   ALT 0 - 44 IU/L   13       Latest Ref Rng & Units 01/04/2021   10:10 AM 07/10/2020    8:17 AM 11/28/2019   12:10 PM  CBC  WBC 3.4 - 10.8 x10E3/uL 5.0  4.6  4.7   Hemoglobin 13.0 - 17.7 g/dL 14.4  14.8  13.7   Hematocrit 37.5 - 51.0 % 43.0  44.5  41.6   Platelets 150 - 450 x10E3/uL 186  183  167    Lipid Panel     Component Value Date/Time   CHOL 134 07/10/2020 0817   TRIG 56 07/10/2020 0817   HDL 50 07/10/2020 0817   LDLCALC 72 07/10/2020 0817   HEMOGLOBIN A1C No results found for: "HGBA1C", "MPG" TSH No results for input(s): "TSH" in the last 8760 hours.  External labs:   07/04/2019: PSA 4.900   06/18/2018: TSH 1.69.  Normal H and H, MCV 98, MCH 33, CBC otherwise normal.  Creatinine 1.82, EGFR 35/41, potassium 5.2, CMP otherwise normal.  09/28/2017: Creatinine 1.84, EGFR 35/41, potassium 5.6, CMP otherwise normal.  Normal H&H, MCV 99, CBC otherwise  normal.  05/23/2015: Total cholesterol 208, triglycerides 113, HDL 53, LDL 132, creatinine 1.53, eGFR 44, potassium 4.6, CMP otherwise normal, CBC normal, PSA elevated at 5.3  Medications and allergies  No Known Allergies   Current Outpatient Medications on File Prior to Visit  Medication Sig Dispense Refill   acetaminophen (TYLENOL) 500 MG tablet Take 1,000 mg by mouth every 6 (six) hours as needed for moderate pain or headache.     amiodarone (PACERONE) 200 MG tablet Take 1 tablet (200 mg total) by mouth daily. Take twice daily for 10 days then one tab daily. (Patient taking differently: Take 200 mg by mouth daily.) 90 tablet 3   diltiazem (TIAZAC) 120 MG 24 hr capsule Take 1 capsule (120 mg total) by mouth daily. 90 capsule 3   ELIQUIS 5 MG TABS tablet TAKE 1 TABLET(5 MG) BY MOUTH TWICE DAILY 180 tablet 3   lisinopril (ZESTRIL) 5 MG tablet TAKE 1 TABLET(5 MG) BY MOUTH DAILY 90 tablet 1   psyllium (METAMUCIL) 58.6 % packet Take 1 packet by mouth daily as needed (constipation).      rosuvastatin (CRESTOR) 5 MG tablet TAKE 1 TABLET(5 MG) BY MOUTH DAILY (Patient taking differently: Take 5 mg by mouth every evening.) 90 tablet 3   No current facility-administered medications on file prior to visit.    Radiology:   No results found.  Cardiac Studies:   Treadmill stress test  [10/09/2014]: Indication: Atrial Fibrillation Resting EKG SR with 1st degree AV Block, LAD, LAFB, RBBB. Trifascicular block. Normal BP response. There was no ST-T changes of ischemia with exercise stress test. No stress induced arrhythmias. Stress terminated due to THR (>85% MPHR)/MPHR met. The patient exercised according to [place]   Protocol, Total time recorded 06:50 min achieving max heart rate of 158 which was 106% of MPHR for age and 8.34 METS of work.  Coronary CTA 03/06/2019: 1. Coronary artery calcium score 1837 Agatston units. This places the patient in the 86th percentile for age and gender, suggesting high risk  for future cardiac events. 2. Extensive calcified plaque throughout the coronary system. Mild stenosis for the most part, possibly around 50% stenosis in the distal RCA and the proximal LCx (small vessel). However, difficult to quantify given extensive calcification with possibility of blooming artifact. Will send for FFR. 3.  Pulmonary veins without stenosis.  4.  No LA appendage thrombus seen.  FFR 0.9 mid LAD, FFR 0.93 distal RCA, FFR 0.94 mid LCx: No evidence for hemodynamically significant stenosis.  Echocardiogram 09/29/2019:  Left ventricle cavity is normal in size. Mild concentric hypertrophy of the left ventricle. Normal global wall motion.  Normal LV systolic function  with EF 61%. Indeterminate diastolic filling pattern.  Left atrial cavity is severely dilated.  Moderate (Grade II) mitral regurgitation.  Moderate tricuspid regurgitation. Estimated pulmonary artery systolic pressure is 34 mmHg.  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  EKG: EKG 07/08/2021: Sinus rhythm with first-degree AV block at rate of 60 bpm, normal QT interval.  No evidence of ischemia.  No significant change from 03/13/2021.    Assessment   No diagnosis found.  No orders of the defined types were placed in this encounter.     There are no discontinued medications.   No orders of the defined types were placed in this encounter.    Recommendations:   Gregory [name]  is a 81 y.o. Caucasian male with  history of stage 3b CKD, hypertension, mild hyperlipidemia and moderate CAD by coronary CTA, paroxysmal atrial fibrillation. He has history of multiple cardioversions and eventually dofetilide was discontinued by Dr. Camnitz due to efficacy, and was placed on  Multaq and has maintained sinus since Dec 2018. Due to recurrence of atrial fibrillation, he underwent 2nd ablation on 02/22/2019, 1st ablation being on 11/28/2016.  He had repeat cardioversion on 05/24/2019 and after Tikosyn initiation had  cardioversion on 12/01/2019.   He is tolerating Tikosyn without any side effects.  He has had occasional episodes of atrial fibrillation, longest lasting 2 days.  He is tolerating anticoagulation without bleeding diathesis.  He is having partial right knee replacement so, preop note sent to the surgeon.  Labs are being obtained preop, I have added magnesium levels to this.  This is to follow-up on his renal function as well as he does have stage IIIa/B chronic kidney disease.  Blood pressure is well controlled.  He is presently on a statin for CAD and his lipids are at goal.  No changes in the medications were done today.  I will see him back in 6 months.    Celeste C Cantwell, MD, FACC 12/04/2021, 2:50 PM Office: [phone] Pager: [phone]  

## 2021-12-05 ENCOUNTER — Encounter: Payer: Self-pay | Admitting: Gastroenterology

## 2021-12-05 ENCOUNTER — Ambulatory Visit (INDEPENDENT_AMBULATORY_CARE_PROVIDER_SITE_OTHER): Payer: Medicare Other | Admitting: Gastroenterology

## 2021-12-05 ENCOUNTER — Ambulatory Visit: Payer: Medicare Other | Admitting: Student

## 2021-12-05 ENCOUNTER — Encounter: Payer: Self-pay | Admitting: Student

## 2021-12-05 VITALS — BP 118/66 | HR 62 | Ht 71.0 in | Wt 186.1 lb

## 2021-12-05 VITALS — BP 124/57 | HR 65 | Temp 98.4°F | Resp 16 | Ht 71.0 in | Wt 187.6 lb

## 2021-12-05 DIAGNOSIS — I480 Paroxysmal atrial fibrillation: Secondary | ICD-10-CM

## 2021-12-05 DIAGNOSIS — R1084 Generalized abdominal pain: Secondary | ICD-10-CM | POA: Diagnosis not present

## 2021-12-05 DIAGNOSIS — K5900 Constipation, unspecified: Secondary | ICD-10-CM | POA: Diagnosis not present

## 2021-12-05 DIAGNOSIS — R0609 Other forms of dyspnea: Secondary | ICD-10-CM

## 2021-12-05 DIAGNOSIS — R194 Change in bowel habit: Secondary | ICD-10-CM | POA: Insufficient documentation

## 2021-12-05 DIAGNOSIS — I951 Orthostatic hypotension: Secondary | ICD-10-CM

## 2021-12-05 NOTE — Progress Notes (Signed)
12/05/2021 Gregory [name] [id number] 02-24-1941   HISTORY OF PRESENT ILLNESS: This is an 81 year old male who is a patient of Dr. Ardis Hughs.  Was seen here earlier this year by Amy asteroid, PA-C his last colonoscopy was done in October 2012 per Dr. Sharlett Iles and was a normal exam.  He also had colonoscopy in 2008 which was a normal exam.  He had tubular adenomatous polyps in 2003. Patient has history of hypertension, osteoarthritis, atrial fibrillation for which she is on Eliquis, chronic kidney disease stage III, BPH and moderate coronary artery disease. At his visit here in February he was not reporting any issues so they determined no need for further screening or surveillance colonoscopies.  He presents here today complaining of constipation.  He tells me that he has had constipation for a long time and has taken Metamucil for years.  Recently, however, he has been skipping a couple of days without a bowel movement and then will have a very large stool.  He has added in 2 stool softeners and some flaxseed daily.  He also reports a "dull ache" in his "lower bottom" area, but not his abdomen and not his anal area.  He says that he is wondering if that could be more musculoskeletal or something.  He denies any rectal bleeding. He is on Eliquis and had a cardioversion last month.  He tells me that he is actually being evaluated by cardiology later today for some complaints of shortness of breath that he has been experiencing recently. No weight loss.   Past Medical History:  Diagnosis Date   Arthritis    "right knee" (01/28/2016)   Diverticulosis of colon (without mention of hemorrhage)    Dysrhythmia    Hyperlipidemia    Internal hemorrhoids without mention of complication    Personal history of colonic polyps 01/17/2002   adenomatous, tublar adenoma 02/10/2007   PNA (pneumonia) 03/31/2012   Past Surgical History:  Procedure Laterality Date   ATRIAL FIBRILLATION ABLATION  11/28/2016    ATRIAL FIBRILLATION ABLATION N/A 11/28/2016   Procedure: Atrial Fibrillation Ablation;  Surgeon: Constance Haw, MD;  Location: [place] [facility];  Service: Cardiovascular;  Laterality: N/A;   ATRIAL FIBRILLATION ABLATION N/A 03/18/2019   Procedure: ATRIAL FIBRILLATION ABLATION;  Surgeon: Constance Haw, MD;  Location: [place] [facility];  Service: Cardiovascular;  Laterality: N/A;   CARDIOVERSION  05/03/2012   Procedure: CARDIOVERSION;  Surgeon: Laverda Page, MD;  Location: [place];  Service: Cardiovascular;  Laterality: N/A;   CARDIOVERSION  06/08/2012   Procedure: CARDIOVERSION;  Surgeon: Laverda Page, MD;  Location: [place];  Service: Cardiovascular;  Laterality: N/A;  [name]   CARDIOVERSION N/A 10/28/2013   Procedure: CARDIOVERSION;  Surgeon: Laverda Page, MD;  Location: [place];  Service: Cardiovascular;  Laterality: N/A;   CARDIOVERSION N/A 06/30/2014   Procedure: CARDIOVERSION;  Surgeon: Laverda Page, MD;  Location: [place];  Service: Cardiovascular;  Laterality: N/A;  H&P in file   CARDIOVERSION N/A 09/05/2014   Procedure: CARDIOVERSION;  Surgeon: Adrian Prows, MD;  Location: [place];  Service: Cardiovascular;  Laterality: N/A;   CARDIOVERSION N/A 12/24/2015   Procedure: CARDIOVERSION;  Surgeon: Adrian Prows, MD;  Location: [facility] ENDOSCOPY;  Service: Cardiovascular;  Laterality: N/A;   CARDIOVERSION N/A 01/30/2016   Procedure: CARDIOVERSION;  Surgeon: Adrian Prows, MD;  Location: [place];  Service: Cardiovascular;  Laterality: N/A;   CARDIOVERSION N/A 02/08/2016   Procedure: CARDIOVERSION;  Surgeon: Adrian Prows,  MD;  Location: [facility] ENDOSCOPY;  Service: Cardiovascular;  Laterality: N/A;   CARDIOVERSION N/A 02/08/2019   Procedure: CARDIOVERSION;  Surgeon: [name], MD;  Location: [facility] ENDOSCOPY;  Service: Cardiovascular;  Laterality: N/A;   CARDIOVERSION N/A 05/24/2019   Procedure: CARDIOVERSION;  Surgeon: Parke Poisson, MD;  Location:  [facility] ENDOSCOPY;  Service: Cardiovascular;  Laterality: N/A;   CARDIOVERSION N/A 09/21/2019   Procedure: CARDIOVERSION;  Surgeon: [name], MD;  Location: [facility] ENDOSCOPY;  Service: Cardiovascular;  Laterality: N/A;   CARDIOVERSION N/A 12/01/2019   Procedure: CARDIOVERSION;  Surgeon: [name], MD;  Location: [facility] ENDOSCOPY;  Service: Cardiovascular;  Laterality: N/A;   CARDIOVERSION N/A 08/20/2021   Procedure: CARDIOVERSION;  Surgeon: [name], MD;  Location: [facility] ENDOSCOPY;  Service: Cardiovascular;  Laterality: N/A;   CARDIOVERSION N/A 10/18/2021   Procedure: CARDIOVERSION;  Surgeon: [name], MD;  Location: [facility] ENDOSCOPY;  Service: Cardiovascular;  Laterality: N/A;   CATARACT EXTRACTION W/ INTRAOCULAR LENS  IMPLANT, BILATERAL Bilateral    COLONOSCOPY     INGUINAL HERNIA REPAIR Bilateral    as child   KNEE ARTHROSCOPY Right 2004    reports that he has never smoked. He has never used smokeless tobacco. He reports current alcohol use of about 1.0 standard drink of alcohol per week. He reports that he does not use drugs. family history includes Bladder Cancer in his mother; Cancer in his mother; Heart disease in his father; Stroke in his father. No Known Allergies    Outpatient Encounter Medications as of 12/05/2021  Medication Sig   acetaminophen (TYLENOL) 500 MG tablet Take 1,000 mg by mouth every 6 (six) hours as needed for moderate pain or headache.   amiodarone (PACERONE) 200 MG tablet Take 1 tablet (200 mg total) by mouth daily. Take twice daily for 10 days then one tab daily.   diltiazem (TIAZAC) 120 MG 24 hr capsule Take 1 capsule (120 mg total) by mouth daily.   docusate sodium (COLACE) 100 MG capsule Take 100 mg by mouth 2 (two) times daily.   ELIQUIS 5 MG TABS tablet TAKE 1 TABLET(5 MG) BY MOUTH TWICE DAILY   lisinopril (ZESTRIL) 5 MG tablet TAKE 1 TABLET(5 MG) BY MOUTH DAILY   psyllium (METAMUCIL) 58.6 % packet Take 1 packet by mouth daily as needed (constipation).    rosuvastatin  (CRESTOR) 5 MG tablet TAKE 1 TABLET(5 MG) BY MOUTH DAILY   tamsulosin (FLOMAX) 0.4 MG CAPS capsule Take 0.4 mg by mouth daily.   No facility-administered encounter medications on file as of 12/05/2021.    REVIEW OF SYSTEMS  : All other systems reviewed and negative except where noted in the History of Present Illness.   PHYSICAL EXAM: BP 118/66   Pulse 62   Ht 5\' 11"  (1.803 m)   Wt 186 lb 2 oz (84.4 kg)   BMI 25.96 kg/m  General: Well developed white male in no acute distress Head: Normocephalic and atraumatic Eyes:  Sclerae anicteric, conjunctiva pink. Ears: Normal auditory acuity Lungs: Clear throughout to auscultation; no W/R/R. Heart: Regular rate and rhythm; no M/R/G. Abdomen: Soft, non-distended.  BS present.  Minimal lower abdominal TTP. Musculoskeletal: Symmetrical with no gross deformities  Skin: No lesions on visible extremities Extremities: No edema  Neurological: Alert oriented x 4, grossly non-focal Psychological:  Alert and cooperative. Normal mood and affect  ASSESSMENT AND PLAN: *Change in bowel habits with constipation and some lower abdominal pain/discomfort: Longstanding issues with constipation for which he is taking Metamucil for many years, but he  has recently added stool softeners and the flaxseed and as well.  Will skip a day or 2 without a bowel movement and then have a very large bowel movement.  I suggested discontinuing the stool softener and adding a dose of MiraLAX, 1 capful mixed in 8 ounces of liquid daily instead.  Earlier this year he was not reporting any issues so they decided against any further screening/surveillance colonoscopies.  He is 81 years old, on Eliquis, etc.  We discussed repeating colonoscopy, but now he is actually going to be evaluated by cardiology later today for some complaints of shortness of breath.  He had a cardioversion last month.  I think that with those issues we will try to proceed more conservatively.  We discussed doing a  CT scan of the abdomen and pelvis with p.o. contrast only due to his chronic kidney disease to be sure there is no bulky pathology noted.  He is agreeable to this.  We will schedule that.   CC:  [name], MD

## 2021-12-05 NOTE — Patient Instructions (Signed)
If you are age 80 or older, your body mass index should be between 23-30. Your Body mass index is 25.96 kg/m. If this is out of the aforementioned range listed, please consider follow up with your Primary Care Provider. ________________________________________________________  The [place] [facility] providers would like to encourage you to use [facility] to communicate with providers for non-urgent requests or questions.  Due to long hold times on the telephone, sending your provider a message by [facility] may be a faster and more efficient way to get a response.  Please allow 48 business hours for a response.  Please remember that this is for non-urgent requests.  _______________________________________________________  Gregory [name] have been scheduled for a CT scan of the abdomen and pelvis at [facility], 1st floor Radiology. You are scheduled on 12-13-21  at 12:30pm. You should arrive 30 minutes prior to your appointment time for registration.  The solution may taste better if refrigerated, but do NOT add ice or any other liquid to this solution. Shake well before drinking.   Please follow the written instructions below on the day of your exam:   1) Do not eat anything after 8:30am (4 hours prior to your test)   2) Drink 1 bottle of contrast @ 10:30am (2 hours prior to your exam)  Remember to shake well before drinking and do NOT pour over ice.     Drink 1 bottle of contrast @ 11:30am (1 hour prior to your exam)   You may take any medications as prescribed with a small amount of water, if necessary. If you take any of the following medications: METFORMIN, GLUCOPHAGE, GLUCOVANCE, AVANDAMET, RIOMET, FORTAMET, [place] MET, JANUMET, GLUMETZA or METAGLIP, you MAY be asked to HOLD this medication 48 hours AFTER the exam.   The purpose of you drinking the oral contrast is to aid in the visualization of your intestinal tract. The contrast solution may cause some diarrhea. Depending on your individual set of  symptoms, you may also receive an intravenous injection of x-ray contrast/dye. Plan on being at [facility] for 45 minutes or longer, depending on the type of exam you are having performed.   If you have any questions regarding your exam or if you need to reschedule, you may call [facility] at [phone] between the hours of 8:00 am and 5:00 pm, Monday-Friday.   Due to recent changes in healthcare laws, you may see the results of your imaging and laboratory studies on MyChart before your provider has had a chance to review them.  We understand that in some cases there may be results that are confusing or concerning to you. Not all laboratory results come back in the same time frame and the provider may be waiting for multiple results in order to interpret others.  Please give [place] 48 hours in order for your provider to thoroughly review all the results before contacting the office for clarification of your results.   DISCONTINUE: Stool softeners  Please purchase the following medications over the counter and take as directed:  START: Miralax take one capful in 8 ounces of fluid daily.  Thank you for entrusting me with your care and choosing [facility].  Alonza Bogus, PA-C

## 2021-12-12 ENCOUNTER — Ambulatory Visit (INDEPENDENT_AMBULATORY_CARE_PROVIDER_SITE_OTHER): Payer: Medicare Other | Admitting: Physical Therapy

## 2021-12-12 ENCOUNTER — Encounter: Payer: Self-pay | Admitting: Physical Therapy

## 2021-12-12 DIAGNOSIS — M25671 Stiffness of right ankle, not elsewhere classified: Secondary | ICD-10-CM

## 2021-12-12 DIAGNOSIS — M79651 Pain in right thigh: Secondary | ICD-10-CM | POA: Diagnosis not present

## 2021-12-12 NOTE — Therapy (Signed)
OUTPATIENT PHYSICAL THERAPY EVALUATION   Patient Name: Gregory [name] MRN: [id number] DOB:Mar 10, 1941, 81 y.o., male Today's Date: 12/12/2021    PT End of Session - 12/12/21 2117     Visit Number 1    Number of Visits 12    Date for PT Re-Evaluation 01/23/22    Authorization Type Medicare    PT Start Time 1518    PT Stop Time 8115    PT Time Calculation (min) 38 min    Activity Tolerance Patient tolerated treatment well    Behavior During Therapy [facility] for tasks assessed/performed                 Past Medical History:  Diagnosis Date   Arthritis    "right knee" (01/28/2016)   Diverticulosis of colon (without mention of hemorrhage)    Dysrhythmia    Hyperlipidemia    Internal hemorrhoids without mention of complication    Personal history of colonic polyps 01/17/2002   adenomatous, tublar adenoma 02/10/2007   PNA (pneumonia) 03/31/2012   Past Surgical History:  Procedure Laterality Date   ATRIAL FIBRILLATION ABLATION  11/28/2016   ATRIAL FIBRILLATION ABLATION N/A 11/28/2016   Procedure: Atrial Fibrillation Ablation;  Surgeon: Gregory Haw, MD;  Location: [place] CV [facility];  Service: Cardiovascular;  Laterality: N/A;   ATRIAL FIBRILLATION ABLATION N/A 03/18/2019   Procedure: ATRIAL FIBRILLATION ABLATION;  Surgeon: Gregory Haw, MD;  Location: [place] [facility];  Service: Cardiovascular;  Laterality: N/A;   CARDIOVERSION  05/03/2012   Procedure: CARDIOVERSION;  Surgeon: Gregory Page, MD;  Location: [place];  Service: Cardiovascular;  Laterality: N/A;   CARDIOVERSION  06/08/2012   Procedure: CARDIOVERSION;  Surgeon: Gregory Page, MD;  Location: [place];  Service: Cardiovascular;  Laterality: N/A;  Gregory [name] Gregory [name]   CARDIOVERSION N/A 10/28/2013   Procedure: CARDIOVERSION;  Surgeon: Gregory Page, MD;  Location: [place];  Service: Cardiovascular;  Laterality: N/A;   CARDIOVERSION N/A 06/30/2014   Procedure: CARDIOVERSION;   Surgeon: Gregory Page, MD;  Location: [place];  Service: Cardiovascular;  Laterality: N/A;  H&P in file   CARDIOVERSION N/A 09/05/2014   Procedure: CARDIOVERSION;  Surgeon: Gregory Prows, MD;  Location: [place];  Service: Cardiovascular;  Laterality: N/A;   CARDIOVERSION N/A 12/24/2015   Procedure: CARDIOVERSION;  Surgeon: Gregory Prows, MD;  Location: [place];  Service: Cardiovascular;  Laterality: N/A;   CARDIOVERSION N/A 01/30/2016   Procedure: CARDIOVERSION;  Surgeon: Gregory Prows, MD;  Location: [place];  Service: Cardiovascular;  Laterality: N/A;   CARDIOVERSION N/A 02/08/2016   Procedure: CARDIOVERSION;  Surgeon: Gregory Prows, MD;  Location: [place];  Service: Cardiovascular;  Laterality: N/A;   CARDIOVERSION N/A 02/08/2019   Procedure: CARDIOVERSION;  Surgeon: Gregory Prows, MD;  Location: [place];  Service: Cardiovascular;  Laterality: N/A;   CARDIOVERSION N/A 05/24/2019   Procedure: CARDIOVERSION;  Surgeon: Gregory Munroe, MD;  Location: [facility] ENDOSCOPY;  Service: Cardiovascular;  Laterality: N/A;   CARDIOVERSION N/A 09/21/2019   Procedure: CARDIOVERSION;  Surgeon: Gregory Prows, MD;  Location: [facility] ENDOSCOPY;  Service: Cardiovascular;  Laterality: N/A;   CARDIOVERSION N/A 12/01/2019   Procedure: CARDIOVERSION;  Surgeon: Gregory Prows, MD;  Location: [facility] ENDOSCOPY;  Service: Cardiovascular;  Laterality: N/A;   CARDIOVERSION N/A 08/20/2021   Procedure: CARDIOVERSION;  Surgeon: Gregory Prows, MD;  Location: [facility] ENDOSCOPY;  Service: Cardiovascular;  Laterality: N/A;   CARDIOVERSION N/A 10/18/2021   Procedure: CARDIOVERSION;  Surgeon: Gregory Prows, MD;  Location: [place];  Service:  Cardiovascular;  Laterality: N/A;   CATARACT EXTRACTION W/ INTRAOCULAR LENS  IMPLANT, BILATERAL Bilateral    COLONOSCOPY     INGUINAL HERNIA REPAIR Bilateral    as child   KNEE ARTHROSCOPY Right 2004   Patient Active Problem List   Diagnosis Date Noted   Change in bowel habits 12/05/2021   Generalized  abdominal pain 12/05/2021   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 01/12/2020   BPH with elevated PSA 12/13/2019   Dermatitis 12/13/2019   Encounter for Medicare annual wellness exam 12/13/2019   Chronic pain of right knee 10/10/2019   Atypical atrial flutter ([place]) 05/17/2019   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/06/2018   Primary osteoarthritis of right knee 08/06/2018   Pes anserinus bursitis of left knee 10/31/2016   Allergic rhinitis 02/14/2016   Hearing loss 02/13/2016   CKD (chronic kidney disease) stage 3, GFR 30-59 ml/min (HCC) 02/13/2016   Atrial fibrillation ([place]) 02/07/2016   Paroxysmal atrial fibrillation ([place]) 03/31/2012   Hypertension 03/31/2012   Hemorrhoids, external 05/15/2011    PCP: Gregory Sacramento, MD  REFERRING PROVIDER: Christain Sacramento, MD  REFERRING DIAG: S/P R TKE (uni)   THERAPY DIAG:  Stiffness of right ankle, not elsewhere classified  Pain in right thigh  ONSET DATE:  Surgery date: 09/11/21 R Uni TKA   SUBJECTIVE:   SUBJECTIVE STATEMENT: Pt was seen in PT post op R TKE, last visit 5/31.  He presents today with complaints of tightness and pain in lateral quad and thigh/ITB. He has continued to do HEP.     PERTINENT HISTORY: Chronic A-Fib,   PAIN:  Are you having pain? Yes: NPRS scale: 1/10 Pain location: R knee Pain description: achey Aggravating factors: increased activity  Relieving factors: Rest, ice    PRECAUTIONS: Knee  WEIGHT BEARING RESTRICTIONS No  FALLS:  Has patient fallen in last 6 months? No   PLOF: Independent  PATIENT GOALS  Decreased pain   OBJECTIVE:    COGNITION:  Overall cognitive status: Within functional limits for tasks assessed      PALPATION: Mild soreness in proximal rec fem, prox and mid vastus lateralis, mid ITB,   LE ROM:  Hip ROM: Mild limitation  Passive ROM Right 12/12/21                                                       Knee  flexion 120       Knee extension 0                                        (Blank rows = not tested)   LE MMT:  R Knee: 4+/5,  R hip: 4+/5   TODAY'S TREATMENT: 12/12/2021 Therapeutic Exercise: Prone quad stretch x3 min; ITB stretch 30 sec x 3 on R;  Education on self STM to R quad/ITB Trigger Point Dry-Needling  Treatment instructions: Expect mild to moderate muscle soreness. S/S of pneumothorax if dry needled over a lung field, and to seek immediate medical attention should they occur. Patient verbalized understanding of these instructions and education.  Patient Consent Given: Yes Education handout provided: Yes Muscles treated: R prox and mid vastus lateralis Electrical stimulation  performed: No Parameters: N/A Treatment response/outcome: Twitch response, palpable increase in tissue length.   PATIENT EDUCATION:  Education details: reviewed HEP, discussed POC, Exam findings  Person educated: Patient Education method: Explanation, Demonstration, Tactile cues, Verbal cues, and Handouts Education comprehension: verbalized understanding, returned demonstration, verbal cues required, tactile cues required, and needs further education   HOME EXERCISE PROGRAM: Access Code: [email or web address] Access Code: [email or web address] URL: [email or web address] Date: 12/12/2021 Prepared by: Lyndee Hensen  Exercises - Prone Quadriceps Stretch with Strap  - 2 x daily - 3 reps - 30 hold - Supine ITB Stretch with Strap  - 2 x daily - 3 reps - 30 hold   ASSESSMENT:  CLINICAL IMPRESSION: Pt with new onset pain of R thigh. He has tenderness in R quad and lateral quad, mild in ITB. He has no pain in R knee joint, and is doing well post op TKE. He does continue to have stiffness in rearfoot on R, from previous ankle surgery. Increased lateral pressure at ankle may be contributory to lateral thigh pain, with increased activity and walking. He will benefit from skilled PT to improve muscle  tension, pain, strength, and to ensure ability for full functional activity without pain.   OBJECTIVE IMPAIRMENTS Abnormal gait, decreased activity tolerance, decreased balance, decreased endurance, decreased knowledge of use of DME, decreased mobility, difficulty walking, decreased ROM, decreased strength, hypomobility, increased muscle spasms, impaired flexibility, and pain.   ACTIVITY LIMITATIONS cleaning, community activity, driving, meal prep, yard work, and shopping.   PERSONAL FACTORS  none  are also affecting patient's functional outcome.    REHAB POTENTIAL: Good  CLINICAL DECISION MAKING: Stable/uncomplicated  EVALUATION COMPLEXITY: Low   GOALS: Goals reviewed with patient? Yes  SHORT TERM GOALS: Target date: 12/26/21  Pt to be independent with initial HEP Goal status: INITIAL   .  Pt to report pain levels 0-3/10 with activity.  Goal status: INITIAL    LONG TERM GOALS: Target date: 01/23/2022  Pt to be independent with final HEP Goal status: INITIAL  2.  Pt to report decreased pain in R knee to 0-2/10 with activity  Goal status: INITIAL  3.  Pt to demo soft tissue limitations in R quad and thigh to be WNL, to improve pain and function.   Goal status: INITIAL  4.  Pt to demo independent ambulation for community distances, without pain in knee or thigh.  , Goal status: MET     PLAN: PT FREQUENCY: 1-2x/week  PT DURATION: 6 weeks  PLANNED INTERVENTIONS: Therapeutic exercises, Therapeutic activity, Neuromuscular re-education, Balance training, Gait training, Patient/Family education, Joint mobilization, Stair training, DME instructions, Dry Needling, Electrical stimulation, Spinal manipulation, Spinal mobilization, Cryotherapy, Moist heat, Taping, Vasopneumatic device, Ultrasound, Ionotophoresis 37m/ml Dexamethasone, and Manual therapy  PLAN FOR NEXT SESSION: Manual for R quad/ITB, needling as needed, strength for R hip, ankle mobility/gait.   LLyndee Hensen  PT, DPT 9:18 PM  12/12/21

## 2021-12-13 ENCOUNTER — Ambulatory Visit (HOSPITAL_COMMUNITY)
Admission: RE | Admit: 2021-12-13 | Discharge: 2021-12-13 | Disposition: A | Discharge status: Home / Self Care | Payer: Medicare Other | Source: Ambulatory Visit | Attending: Gastroenterology | Admitting: Gastroenterology

## 2021-12-13 DIAGNOSIS — R1084 Generalized abdominal pain: Secondary | ICD-10-CM | POA: Diagnosis present

## 2021-12-13 DIAGNOSIS — K5900 Constipation, unspecified: Secondary | ICD-10-CM | POA: Insufficient documentation

## 2021-12-13 DIAGNOSIS — R194 Change in bowel habit: Secondary | ICD-10-CM | POA: Diagnosis present

## 2021-12-19 ENCOUNTER — Other Ambulatory Visit: Payer: Self-pay

## 2021-12-27 ENCOUNTER — Ambulatory Visit (INDEPENDENT_AMBULATORY_CARE_PROVIDER_SITE_OTHER): Payer: Medicare Other | Admitting: Physical Therapy

## 2021-12-27 ENCOUNTER — Encounter: Payer: Self-pay | Admitting: Physical Therapy

## 2021-12-27 DIAGNOSIS — M25671 Stiffness of right ankle, not elsewhere classified: Secondary | ICD-10-CM

## 2021-12-27 DIAGNOSIS — M79651 Pain in right thigh: Secondary | ICD-10-CM | POA: Diagnosis not present

## 2021-12-27 NOTE — Therapy (Signed)
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 01/12/2020   Arthritis    "right knee" (01/28/2016)   Atypical atrial flutter ([place]) 05/17/2019   Diverticulosis of colon (without mention of hemorrhage)    Dysrhythmia    Hyperlipidemia    Internal hemorrhoids without mention of complication    Personal history of colonic polyps 01/17/2002   adenomatous, tublar adenoma 02/10/2007   PNA (pneumonia) 03/31/2012   Past Surgical History:  Procedure Laterality Date   ATRIAL FIBRILLATION ABLATION  11/28/2016   ATRIAL FIBRILLATION ABLATION N/A 11/28/2016   Procedure: Atrial Fibrillation Ablation;  Surgeon: Constance Haw, MD;  Location: [place] CV [facility];  Service: Cardiovascular;  Laterality: N/A;   ATRIAL FIBRILLATION ABLATION N/A 03/18/2019   Procedure: ATRIAL FIBRILLATION ABLATION;  Surgeon: Constance Haw, MD;  Location: [place] CV [facility];  Service: Cardiovascular;  Laterality: N/A;   CARDIOVERSION  05/03/2012   Procedure: CARDIOVERSION;  Surgeon: Laverda Page, MD;  Location: [place];  Service: Cardiovascular;  Laterality: N/A;   CARDIOVERSION  06/08/2012   Procedure: CARDIOVERSION;  Surgeon: Laverda Page, MD;  Location: [place];  Service: Cardiovascular;  Laterality: N/A;  [name]   CARDIOVERSION N/A 10/28/2013   Procedure: CARDIOVERSION;  Surgeon: Laverda Page, MD;  Location: [place];  Service: Cardiovascular;  Laterality: N/A;   CARDIOVERSION N/A 06/30/2014   Procedure: CARDIOVERSION;  Surgeon: Laverda Page, MD;  Location: [place];  Service: Cardiovascular;  Laterality: N/A;  H&P in file   CARDIOVERSION N/A 09/05/2014   Procedure: CARDIOVERSION;  Surgeon: Adrian Prows, MD;  Location: [place];  Service: Cardiovascular;  Laterality: N/A;   CARDIOVERSION N/A 12/24/2015   Procedure: CARDIOVERSION;  Surgeon: Adrian Prows, MD;  Location: [place];  Service: Cardiovascular;  Laterality: N/A;   CARDIOVERSION N/A 01/30/2016   Procedure: CARDIOVERSION;  Surgeon: Adrian Prows, MD;  Location: [place];  Service: Cardiovascular;  Laterality: N/A;   CARDIOVERSION N/A 02/08/2016   Procedure: CARDIOVERSION;  Surgeon: Adrian Prows, MD;  Location: [place];  Service: Cardiovascular;  Laterality: N/A;   CARDIOVERSION N/A 02/08/2019   Procedure: CARDIOVERSION;  Surgeon: Adrian Prows, MD;  Location: [place];  Service: Cardiovascular;  Laterality: N/A;   CARDIOVERSION N/A 05/24/2019   Procedure: CARDIOVERSION;  Surgeon: Elouise Munroe, MD;  Location: [facility] ENDOSCOPY;  Service: Cardiovascular;  Laterality: N/A;   CARDIOVERSION N/A 09/21/2019   Procedure: CARDIOVERSION;  Surgeon: Adrian Prows, MD;  Location: [facility] ENDOSCOPY;  Service: Cardiovascular;  Laterality: N/A;   CARDIOVERSION N/A 12/01/2019   Procedure: CARDIOVERSION;  Surgeon: Adrian Prows, MD;  Location: [place];  Service: Cardiovascular;  Laterality: N/A;   CARDIOVERSION N/A 08/20/2021   Procedure: CARDIOVERSION;  Surgeon: Adrian Prows, MD;  Location: [place];  Service: Cardiovascular;  Laterality: N/A;  CARDIOVERSION N/A 10/18/2021   Procedure: CARDIOVERSION;  Surgeon: Adrian Prows, MD;  Location: [place];  Service: Cardiovascular;  Laterality: N/A;   CATARACT EXTRACTION W/ INTRAOCULAR LENS  IMPLANT, BILATERAL Bilateral    COLONOSCOPY     INGUINAL HERNIA REPAIR Bilateral    as child   KNEE ARTHROSCOPY Right  2004   Patient Active Problem List   Diagnosis Date Noted   Closed right hip fracture ([place]) 12/28/2021   HLD (hyperlipidemia) 12/28/2021   Change in bowel habits 12/05/2021   Generalized abdominal pain 12/05/2021   BPH with elevated PSA 12/13/2019   Dermatitis 12/13/2019   Encounter for Medicare annual wellness exam 12/13/2019   Chronic pain of right knee 10/10/2019   Acquired thrombophilia ([place]) 05/17/2019   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/06/2018   Primary osteoarthritis of right knee 08/06/2018   Pes anserinus bursitis of left knee 10/31/2016   Allergic rhinitis 02/14/2016   Hearing loss 02/13/2016   CKD (chronic kidney disease) stage 3, GFR 30-59 ml/min (HCC) 02/13/2016   Paroxysmal atrial fibrillation ([place]) 03/31/2012   Hypertension 03/31/2012   Hemorrhoids, external 05/15/2011    PCP: [place] [place] [facility]  REFERRING PROVIDER: Christain Sacramento, MD  REFERRING DIAG: S/P R TKE (uni)   THERAPY DIAG:  Pain in right thigh  Stiffness of right ankle, not elsewhere classified  ONSET DATE:  Surgery date: 09/11/21 R Uni TKA   SUBJECTIVE:   SUBJECTIVE STATEMENT: Pt states increased soreness in med and lateral knee joint this week, after sitting for a while or in the AM. Thigh pain is a bit better.     PERTINENT HISTORY: Chronic A-Fib,   PAIN:  Are you having pain? Yes: NPRS scale: 1/10 Pain location: R knee Pain description: achey Aggravating factors: increased activity  Relieving factors: Rest, ice    PRECAUTIONS: Knee  WEIGHT BEARING RESTRICTIONS No  FALLS:  Has patient fallen in last 6 months? No   PLOF: Independent  PATIENT GOALS  Decreased pain   OBJECTIVE:    COGNITION:  Overall cognitive status: Within functional limits for tasks assessed      PALPATION: Mild soreness in proximal rec fem, prox and mid vastus lateralis, mid ITB,   LE ROM:  Hip ROM: Mild limitation  Passive ROM Right 12/12/21                                                        Knee flexion 120       Knee extension 0                                        (Blank rows = not tested)   LE MMT:  R Knee: 4+/5,  R hip: 4+/5   TODAY'S TREATMENT: 12/27/2021  Therapeutic Exercise: Aerobic: recumbent bike L2 x 8 min  Supine: Seated: Sit to stand at higher mat table, with practice for optimal foot position and pressure distribution Standing: Hip abd 2x10 bil;  Lateral step up 6 in x 10 on R ; Ankle glides on step x 20;  Stretches: Prone quad stretch x 4 min, with education for HE Neuromuscular Re-education: Manual Therapy: Trigger Point Dry-Needling  Treatment instructions: Expect mild to moderate muscle soreness. S/S  of pneumothorax if dry needled over a lung field, and to seek immediate medical attention should they occur. Patient verbalized understanding of these instructions and education.  Patient Consent Given: Yes Education handout provided: Previously provided Muscles treated: R vastus lateralis Electrical stimulation performed: No Parameters: N/A Treatment response/outcome: twitch response, palpable increase in muscle length.     PATIENT EDUCATION:  Education details: updated and reviewed HEP Person educated: Patient Education method: Explanation, Demonstration, Tactile cues, Verbal cues, and Handouts Education comprehension: verbalized understanding, returned demonstration, verbal cues required, tactile cues required, and needs further education   HOME EXERCISE PROGRAM: Access Code: [email or web address] Access Code: [email or web address]    ASSESSMENT:  CLINICAL IMPRESSION: Pt with soreness in lateral quad with palpation, good response from dry needling today. He also has increased soreness in joint line of R knee, not with exercises today, but with increased walking at home. Discussed continuing walking program, up to 1 mi, with a day of rest in between. Likely getting increased lateral pressure from foot and knee mechanics.  Pt to benefit from continued mobilization and strengthening.   OBJECTIVE IMPAIRMENTS Abnormal gait, decreased activity tolerance, decreased balance, decreased endurance, decreased knowledge of use of DME, decreased mobility, difficulty walking, decreased ROM, decreased strength, hypomobility, increased muscle spasms, impaired flexibility, and pain.   ACTIVITY LIMITATIONS cleaning, community activity, driving, meal prep, yard work, and shopping.   PERSONAL FACTORS  none  are also affecting patient's functional outcome.    REHAB POTENTIAL: Good  CLINICAL DECISION MAKING: Stable/uncomplicated  EVALUATION COMPLEXITY: Low   GOALS: Goals reviewed with patient? Yes  SHORT TERM GOALS: Target date: 12/26/21  Pt to be independent with initial HEP Goal status: INITIAL   .  Pt to report pain levels 0-3/10 with activity.  Goal status: INITIAL    LONG TERM GOALS: Target date: 01/23/2022  Pt to be independent with final HEP Goal status: INITIAL  2.  Pt to report decreased pain in R knee to 0-2/10 with activity  Goal status: INITIAL  3.  Pt to demo soft tissue limitations in R quad and thigh to be WNL, to improve pain and function.   Goal status: INITIAL  4.  Pt to demo independent ambulation for community distances, without pain in knee or thigh.  , Goal status: MET     PLAN: PT FREQUENCY: 1-2x/week  PT DURATION: 6 weeks  PLANNED INTERVENTIONS: Therapeutic exercises, Therapeutic activity, Neuromuscular re-education, Balance training, Gait training, Patient/Family education, Joint mobilization, Stair training, DME instructions, Dry Needling, Electrical stimulation, Spinal manipulation, Spinal mobilization, Cryotherapy, Moist heat, Taping, Vasopneumatic device, Ultrasound, Ionotophoresis 58m/ml Dexamethasone, and Manual therapy  PLAN FOR NEXT SESSION: Manual for R quad/ITB, needling as needed, strength for R hip, ankle mobility/gait.   LLyndee Hensen PT, DPT 5:07 PM   12/29/21

## 2021-12-28 ENCOUNTER — Inpatient Hospital Stay (HOSPITAL_COMMUNITY): Payer: Medicare Other | Admitting: Anesthesiology

## 2021-12-28 ENCOUNTER — Inpatient Hospital Stay (HOSPITAL_COMMUNITY)
Admission: EM | Admit: 2021-12-28 | Discharge: 2021-12-30 | DRG: 481 | Disposition: A | Discharge status: Home / Self Care | Payer: Medicare Other | Attending: Internal Medicine | Admitting: Internal Medicine

## 2021-12-28 ENCOUNTER — Emergency Department (HOSPITAL_COMMUNITY): Payer: Medicare Other

## 2021-12-28 ENCOUNTER — Other Ambulatory Visit: Payer: Self-pay

## 2021-12-28 ENCOUNTER — Encounter (HOSPITAL_COMMUNITY): Payer: Self-pay

## 2021-12-28 DIAGNOSIS — E785 Hyperlipidemia, unspecified: Secondary | ICD-10-CM | POA: Diagnosis present

## 2021-12-28 DIAGNOSIS — D696 Thrombocytopenia, unspecified: Secondary | ICD-10-CM | POA: Diagnosis present

## 2021-12-28 DIAGNOSIS — Z7901 Long term (current) use of anticoagulants: Secondary | ICD-10-CM | POA: Diagnosis not present

## 2021-12-28 DIAGNOSIS — D62 Acute posthemorrhagic anemia: Secondary | ICD-10-CM | POA: Diagnosis not present

## 2021-12-28 DIAGNOSIS — K5730 Diverticulosis of large intestine without perforation or abscess without bleeding: Secondary | ICD-10-CM | POA: Diagnosis present

## 2021-12-28 DIAGNOSIS — I480 Paroxysmal atrial fibrillation: Secondary | ICD-10-CM | POA: Diagnosis present

## 2021-12-28 DIAGNOSIS — N183 Chronic kidney disease, stage 3 unspecified: Secondary | ICD-10-CM | POA: Diagnosis present

## 2021-12-28 DIAGNOSIS — S72001A Fracture of unspecified part of neck of right femur, initial encounter for closed fracture: Secondary | ICD-10-CM | POA: Diagnosis not present

## 2021-12-28 DIAGNOSIS — N289 Disorder of kidney and ureter, unspecified: Secondary | ICD-10-CM | POA: Diagnosis not present

## 2021-12-28 DIAGNOSIS — R0609 Other forms of dyspnea: Secondary | ICD-10-CM

## 2021-12-28 DIAGNOSIS — Z96651 Presence of right artificial knee joint: Secondary | ICD-10-CM | POA: Diagnosis present

## 2021-12-28 DIAGNOSIS — G8929 Other chronic pain: Secondary | ICD-10-CM | POA: Diagnosis present

## 2021-12-28 DIAGNOSIS — N1831 Chronic kidney disease, stage 3a: Secondary | ICD-10-CM

## 2021-12-28 DIAGNOSIS — Y92007 Garden or yard of unspecified non-institutional (private) residence as the place of occurrence of the external cause: Secondary | ICD-10-CM | POA: Diagnosis not present

## 2021-12-28 DIAGNOSIS — G5793 Unspecified mononeuropathy of bilateral lower limbs: Secondary | ICD-10-CM | POA: Diagnosis present

## 2021-12-28 DIAGNOSIS — Z79899 Other long term (current) drug therapy: Secondary | ICD-10-CM | POA: Diagnosis not present

## 2021-12-28 DIAGNOSIS — R972 Elevated prostate specific antigen [PSA]: Secondary | ICD-10-CM

## 2021-12-28 DIAGNOSIS — N1832 Chronic kidney disease, stage 3b: Secondary | ICD-10-CM | POA: Diagnosis present

## 2021-12-28 DIAGNOSIS — S72141A Displaced intertrochanteric fracture of right femur, initial encounter for closed fracture: Principal | ICD-10-CM | POA: Diagnosis present

## 2021-12-28 DIAGNOSIS — R739 Hyperglycemia, unspecified: Secondary | ICD-10-CM | POA: Diagnosis present

## 2021-12-28 DIAGNOSIS — E8809 Other disorders of plasma-protein metabolism, not elsewhere classified: Secondary | ICD-10-CM | POA: Diagnosis not present

## 2021-12-28 DIAGNOSIS — W010XXA Fall on same level from slipping, tripping and stumbling without subsequent striking against object, initial encounter: Secondary | ICD-10-CM | POA: Diagnosis present

## 2021-12-28 DIAGNOSIS — N400 Enlarged prostate without lower urinary tract symptoms: Secondary | ICD-10-CM | POA: Diagnosis present

## 2021-12-28 DIAGNOSIS — D7589 Other specified diseases of blood and blood-forming organs: Secondary | ICD-10-CM | POA: Diagnosis not present

## 2021-12-28 DIAGNOSIS — N179 Acute kidney failure, unspecified: Secondary | ICD-10-CM | POA: Diagnosis present

## 2021-12-28 DIAGNOSIS — I10 Essential (primary) hypertension: Secondary | ICD-10-CM | POA: Diagnosis present

## 2021-12-28 DIAGNOSIS — I4891 Unspecified atrial fibrillation: Secondary | ICD-10-CM | POA: Diagnosis not present

## 2021-12-28 DIAGNOSIS — Z8249 Family history of ischemic heart disease and other diseases of the circulatory system: Secondary | ICD-10-CM | POA: Diagnosis not present

## 2021-12-28 DIAGNOSIS — Z8701 Personal history of pneumonia (recurrent): Secondary | ICD-10-CM | POA: Diagnosis not present

## 2021-12-28 DIAGNOSIS — I129 Hypertensive chronic kidney disease with stage 1 through stage 4 chronic kidney disease, or unspecified chronic kidney disease: Secondary | ICD-10-CM | POA: Diagnosis present

## 2021-12-28 DIAGNOSIS — G629 Polyneuropathy, unspecified: Secondary | ICD-10-CM | POA: Diagnosis present

## 2021-12-28 HISTORY — DX: Fracture of unspecified part of neck of right femur, initial encounter for closed fracture: S72.001A

## 2021-12-28 LAB — COMPREHENSIVE METABOLIC PANEL
ALT: 20 U/L (ref 0–44)
AST: 19 U/L (ref 15–41)
Albumin: 3.7 g/dL (ref 3.5–5.0)
Alkaline Phosphatase: 49 U/L (ref 38–126)
Anion gap: 9 (ref 5–15)
BUN: 30 mg/dL — ABNORMAL HIGH (ref 8–23)
CO2: 22 mmol/L (ref 22–32)
Calcium: 8.9 mg/dL (ref 8.9–10.3)
Chloride: 110 mmol/L (ref 98–111)
Creatinine, Ser: 1.98 mg/dL — ABNORMAL HIGH (ref 0.61–1.24)
GFR, Estimated: 34 mL/min — ABNORMAL LOW (ref >60)
Glucose, Bld: 158 mg/dL — ABNORMAL HIGH (ref 70–99)
Potassium: 3.8 mmol/L (ref 3.5–5.1)
Sodium: 141 mmol/L (ref 135–145)
Total Bilirubin: 0.7 mg/dL (ref 0.3–1.2)
Total Protein: 6.2 g/dL — ABNORMAL LOW (ref 6.5–8.1)

## 2021-12-28 LAB — CBC WITH DIFFERENTIAL/PLATELET
Abs Immature Granulocytes: 0.02 10*3/uL (ref 0.00–0.07)
Basophils Absolute: 0 10*3/uL (ref 0.0–0.1)
Basophils Relative: 0 %
Eosinophils Absolute: 0.1 10*3/uL (ref 0.0–0.5)
Eosinophils Relative: 1 %
HCT: 40.4 % (ref 39.0–52.0)
Hemoglobin: 13.1 g/dL (ref 13.0–17.0)
Immature Granulocytes: 0 %
Lymphocytes Relative: 15 %
Lymphs Abs: 1.3 10*3/uL (ref 0.7–4.0)
MCH: 33 pg (ref 26.0–34.0)
MCHC: 32.4 g/dL (ref 30.0–36.0)
MCV: 101.8 fL — ABNORMAL HIGH (ref 80.0–100.0)
Monocytes Absolute: 0.4 10*3/uL (ref 0.1–1.0)
Monocytes Relative: 4 %
Neutro Abs: 6.7 10*3/uL (ref 1.7–7.7)
Neutrophils Relative %: 80 %
Platelets: 159 10*3/uL (ref 150–400)
RBC: 3.97 MIL/uL — ABNORMAL LOW (ref 4.22–5.81)
RDW: 13.8 % (ref 11.5–15.5)
WBC: 8.4 10*3/uL (ref 4.0–10.5)
nRBC: 0 % (ref 0.0–0.2)

## 2021-12-28 LAB — TYPE AND SCREEN
ABO/RH(D): A NEG
Antibody Screen: NEGATIVE

## 2021-12-28 MED ORDER — ROSUVASTATIN CALCIUM 5 MG PO TABS
5.0000 mg | ORAL_TABLET | Freq: Every day | ORAL | Status: DC
  Administered 2021-12-29 – 2021-12-30 (×2): 5 mg via ORAL
  Filled 2021-12-28 (×2): qty 1

## 2021-12-28 MED ORDER — HYDROCODONE-ACETAMINOPHEN 5-325 MG PO TABS: 1.0000 | ORAL_TABLET | Freq: Four times a day (QID) | ORAL | Status: DC | PRN

## 2021-12-28 MED ORDER — TAMSULOSIN HCL 0.4 MG PO CAPS
0.4000 mg | ORAL_CAPSULE | Freq: Every day | ORAL | Status: DC
  Administered 2021-12-29 – 2021-12-30 (×2): 0.4 mg via ORAL
  Filled 2021-12-28 (×2): qty 1

## 2021-12-28 MED ORDER — MORPHINE SULFATE (PF) 2 MG/ML IV SOLN: 0.5000 mg | INTRAVENOUS | Status: DC | PRN

## 2021-12-28 MED ORDER — LISINOPRIL 5 MG PO TABS
5.0000 mg | ORAL_TABLET | Freq: Every day | ORAL | Status: DC
  Administered 2021-12-30: 5 mg via ORAL
  Filled 2021-12-28: qty 1

## 2021-12-28 MED ORDER — POLYETHYLENE GLYCOL 3350 17 G PO PACK: 17.0000 g | PACK | Freq: Every day | ORAL | Status: DC | PRN

## 2021-12-28 MED ORDER — AMIODARONE HCL 200 MG PO TABS
200.0000 mg | ORAL_TABLET | Freq: Every day | ORAL | Status: DC
  Administered 2021-12-30: 200 mg via ORAL
  Filled 2021-12-28 (×2): qty 1

## 2021-12-28 MED ORDER — ROPIVACAINE HCL 5 MG/ML IJ SOLN
INTRAMUSCULAR | Status: DC | PRN
  Administered 2021-12-28: 20 mL via PERINEURAL

## 2021-12-28 MED ORDER — FENTANYL CITRATE PF 50 MCG/ML IJ SOSY
50.0000 ug | PREFILLED_SYRINGE | Freq: Once | INTRAMUSCULAR | Status: AC
  Administered 2021-12-28: 50 ug via INTRAVENOUS
  Filled 2021-12-28: qty 1

## 2021-12-28 NOTE — Consult Note (Signed)
Brief orthopedic consult note:  Called by the ER for this patient.  Larey Seat on his right hip and has a right intertrochanteric hip fracture.  He is indicated for surgery given his baseline ambulatory status.  Patient was posted for surgery tomorrow a.m.  Please keep n.p.o. past midnight.  Block ordered.  Full consult note in the AM.

## 2021-12-28 NOTE — Anesthesia Preprocedure Evaluation (Signed)
Anesthesia Evaluation  Patient identified by MRN, date of birth, ID band Patient awake    Reviewed: Allergy & Precautions, NPO status , Patient's Chart, lab work & pertinent test results  Airway Mallampati: II  TM Distance: >3 FB Neck ROM: Full    Dental  (+) Teeth Intact   Pulmonary neg pulmonary ROS,    Pulmonary exam normal breath sounds clear to auscultation       Cardiovascular hypertension, Pt. on medications Normal cardiovascular exam+ dysrhythmias Atrial Fibrillation  Rhythm:Regular Rate:Normal     Neuro/Psych  Neuromuscular disease    GI/Hepatic negative GI ROS, Neg liver ROS,   Endo/Other  negative endocrine ROS  Renal/GU Renal InsufficiencyRenal disease     Musculoskeletal  (+) Arthritis , right hip fracture    Abdominal   Peds  Hematology  (+) Blood dyscrasia (Eliquis), ,   Anesthesia Other Findings Day of surgery medications reviewed with the patient.  Reproductive/Obstetrics                             Anesthesia Physical Anesthesia Plan  ASA: 3  Anesthesia Plan:    Post-op Pain Management: Regional block*   Induction:   PONV Risk Score and Plan: 1 and Treatment may vary due to age or medical condition  Airway Management Planned:   Additional Equipment:   Intra-op Plan:   Post-operative Plan:   Informed Consent: I have reviewed the patients History and Physical, chart, labs and discussed the procedure including the risks, benefits and alternatives for the proposed anesthesia with the patient or authorized representative who has indicated his/her understanding and acceptance.       Plan Discussed with: CRNA  Anesthesia Plan Comments:         Anesthesia Quick Evaluation

## 2021-12-28 NOTE — Progress Notes (Signed)
Brought pt from ED bay 24 to PACU at 2152 for Nerve block to be performed by Dr. Arrie Aran. Safety Time Out  was done, consent was obtained. Vitals were stable and pt tolerated procedure well. Brought patient back to ED bay 24  after notifying primary care [place]. Pt exited my care at this time.

## 2021-12-28 NOTE — ED Notes (Signed)
To pac u for nerve block for his broken hip

## 2021-12-28 NOTE — ED Provider Notes (Signed)
[facility] EMERGENCY DEPARTMENT Provider Note   CSN: [id number] Arrival date & time: 12/28/21  1808     History  Chief Complaint  Patient presents with   Gregory [name] is a 81 y.o. male.  The history is provided by the patient, the spouse, a relative and medical records. No language interpreter was used.  Fall This is a new problem. The current episode started 1 to 2 hours ago. The problem occurs rarely. The problem has not changed since onset.Pertinent negatives include no chest pain, no abdominal pain, no headaches and no shortness of breath. The symptoms are aggravated by walking and standing. He has tried nothing for the symptoms. The treatment provided no relief.       Home Medications Prior to Admission medications   Medication Sig Start Date End Date Taking? Authorizing Provider  acetaminophen (TYLENOL) 500 MG tablet Take 1,000 mg by mouth every 6 (six) hours as needed for moderate pain or headache.    [provider]  amiodarone (PACERONE) 200 MG tablet Take 1 tablet (200 mg total) by mouth daily. Take twice daily for 10 days then one tab daily. 08/21/21   [name], MD  ELIQUIS 5 MG TABS tablet TAKE 1 TABLET(5 MG) BY MOUTH TWICE DAILY 08/21/21   [name], MD  lisinopril (ZESTRIL) 5 MG tablet TAKE 1 TABLET(5 MG) BY MOUTH DAILY 08/01/21   [name], MD  psyllium (METAMUCIL) 58.6 % packet Take 1 packet by mouth daily as needed (constipation).     [provider]  rosuvastatin (CRESTOR) 5 MG tablet TAKE 1 TABLET(5 MG) BY MOUTH DAILY 03/08/21   [name], MD  tamsulosin (FLOMAX) 0.4 MG CAPS capsule Take 0.4 mg by mouth daily. 10/17/21   [provider]      Allergies    Patient has no known allergies.    Review of Systems   Review of Systems  Constitutional:  Negative for chills, fatigue and fever.  HENT:  Negative for congestion.   Respiratory:  Negative for cough, chest tightness, shortness of breath and  wheezing.   Cardiovascular:  Negative for chest pain and palpitations.  Gastrointestinal:  Negative for abdominal pain, constipation, diarrhea, nausea and vomiting.  Genitourinary:  Negative for flank pain.  Musculoskeletal:  Negative for back pain and neck pain.  Skin:  Negative for rash and wound.  Neurological:  Negative for light-headedness and headaches.  Psychiatric/Behavioral:  Negative for agitation and confusion.   All other systems reviewed and are negative.   Physical Exam Updated Vital Signs BP (!) 157/79 (BP Location: Left Arm)   Pulse 76   Temp 98.9 F (37.2 C)   Resp 19   Ht 5\' 10"  (1.778 m)   Wt 84.2 kg   SpO2 92%   BMI 26.63 kg/m  Physical Exam Vitals and nursing note reviewed.  Constitutional:      General: He is not in acute distress.    Appearance: He is well-developed. He is not ill-appearing, toxic-appearing or diaphoretic.  HENT:     Head: Normocephalic and atraumatic.     Nose: Nose normal.     Mouth/Throat:     Mouth: Mucous membranes are moist.  Eyes:     Conjunctiva/sclera: Conjunctivae normal.  Cardiovascular:     Rate and Rhythm: Normal rate and regular rhythm.     Heart sounds: No murmur heard. Pulmonary:     Effort: Pulmonary effort is normal. No respiratory distress.  Breath sounds: Normal breath sounds. No wheezing, rhonchi or rales.  Chest:     Chest wall: No tenderness.  Abdominal:     Palpations: Abdomen is soft.     Tenderness: There is no abdominal tenderness. There is no right CVA tenderness, left CVA tenderness, guarding or rebound.  Musculoskeletal:        General: Tenderness and signs of injury present. No swelling.     Cervical back: Neck supple. No tenderness.     Right hip: Tenderness present.     Right lower leg: No edema.     Left lower leg: No edema.       Legs:     Comments: Intact pulse, sensation, strength in the legs.  Tenderness in the right hip.  No laceration seen  Skin:    General: Skin is warm and dry.      Capillary Refill: Capillary refill takes less than 2 seconds.     Findings: No erythema or rash.  Neurological:     Mental Status: He is alert.     Sensory: No sensory deficit.     Motor: No weakness.  Psychiatric:        Mood and Affect: Mood normal.     ED Results / Procedures / Treatments   Labs (all labs ordered are listed, but only abnormal results are displayed) Labs Reviewed  CBC WITH DIFFERENTIAL/PLATELET - Abnormal; Notable for the following components:      Result Value   RBC 3.97 (*)    MCV 101.8 (*)    All other components within normal limits  COMPREHENSIVE METABOLIC PANEL  PROTIME-INR  CBC  BASIC METABOLIC PANEL  TYPE AND SCREEN    EKG None  Radiology DG FEMUR, MIN 2 VIEWS RIGHT  Result Date: 12/28/2021 CLINICAL DATA:  Tripped and fell EXAM: RIGHT FEMUR 2 VIEWS COMPARISON:  12/28/2021 FINDINGS: Frontal and frogleg lateral views of the right femur are obtained. Minimally displaced basicervical right femoral neck fracture is again identified. No dislocation. The remainder of the right femur is unremarkable. Medial compartmental right knee arthroplasty is identified. Soft tissues are unremarkable. IMPRESSION: 1. Acute right femoral neck fracture as above. 2. Unremarkable partial right knee arthroplasty. Electronically Signed   By: Sharlet Salina M.D.   On: 12/28/2021 21:27   DG Hip [address] [place] [address] [place] Pelvis 1 View Right  Result Date: 12/28/2021 CLINICAL DATA:  Fall, right hip pain EXAM: DG HIP (WITH OR WITHOUT PELVIS) 1V PORT RIGHT COMPARISON:  None Available. FINDINGS: There is an acute, mildly displaced basicervical fracture of the right hip. Right femoral head is still seated within the right acetabulum and right hip joint space is preserved. Mild left hip degenerative arthritis noted. Pelvis and left hip are intact. Soft tissues are unremarkable. IMPRESSION: Acute, mildly displaced basicervical fracture of the right hip. No dislocation. Electronically Signed    By: Helyn Numbers M.D.   On: 12/28/2021 20:27   DG Chest Portable 1 View  Result Date: 12/28/2021 CLINICAL DATA:  Larey Seat, hip fracture EXAM: PORTABLE CHEST 1 VIEW COMPARISON:  03/31/2012 FINDINGS: Two frontal views of the chest demonstrate a stable cardiac silhouette. No acute airspace disease, effusion, or pneumothorax. No acute bony abnormalities. IMPRESSION: 1. No acute intrathoracic process. Electronically Signed   By: Sharlet Salina M.D.   On: 12/28/2021 20:05    Procedures Procedures    Medications Ordered in ED Medications  amiodarone (PACERONE) tablet 200 mg (has no administration in time range)  lisinopril (ZESTRIL) tablet  5 mg (has no administration in time range)  rosuvastatin (CRESTOR) tablet 5 mg (has no administration in time range)  tamsulosin (FLOMAX) capsule 0.4 mg (has no administration in time range)  polyethylene glycol (MIRALAX / GLYCOLAX) packet 17 g (has no administration in time range)  fentaNYL (SUBLIMAZE) injection 50 mcg (50 mcg Intravenous Given 12/28/21 2137)    ED Course/ Medical Decision Making/ A&P                           Medical Decision Making Amount and/or Complexity of Data Reviewed Radiology: ordered.  Risk Decision regarding hospitalization.    Gregory [name] is a 81 y.o. male with a past medical history significant for atrial fibrillation on Eliquis therapy, CKD, hyperlipidemia, hypertension, who presents with right hip pain after fall.  According to patient, he was outside this afternoon walking when he tripped on a garden hose landing on pavement driveway.  He report immediate onset of pain in his right hip area and has not been able to walk on it well.  He denies any numbness, tingling, or focal weakness in the leg but hurts to move his right hip.  He denies hitting his head.  Denies any chest pain, abdominal pain, or back pain.  Denies any upper extremity symptoms with he reports scraping his right elbow but is not concerned out.  He  does have bruising on his forearms which she reports was from tomato picking from his garden yesterday.  He reports the pain is mild when he is sitting still.  On exam, lungs clear and chest nontender.  Abdomen nontender.  Right hip is very tender to palpation.  Intact strength, sensation, and pulses in lower extremities.  Back was nontender on my exam.  Clinically I am concerned about possible hip fracture or pelvis fracture.  We will get portable x-ray initially to help guide management.  If x-ray does not show acute fracture, patient may need more advanced imaging given the concern for bony injury.  I personally viewed the images and I am concerned about fracture.  I called radiology who agreed that this appears to show a hip fracture.  7:58 PM I just spoke to Dr. Sherlean Foot with orthopedics who reports that he does not do hips anymore and we need to page out to the general orthopedics team for management.  We will call on-call orthopedics for further management.    Spoke to Dr. Susa Simmonds who requested n.p.o. at midnight and he will see the patient tomorrow for likely surgery in the morning.  Medicine called for admission for further management.          Final Clinical Impression(s) / ED Diagnoses Final diagnoses:  Closed fracture of right hip, initial encounter (HCC)     Clinical Impression: 1. Closed fracture of right hip, initial encounter (HCC)   2. Paroxysmal atrial fibrillation (HCC)   3. DOE (dyspnea on exertion)     Disposition: Admit  This note was prepared with assistance of Conservation officer, historic buildings. Occasional wrong-word or sound-a-like substitutions may have occurred due to the inherent limitations of voice recognition software.     Joziyah Roblero, Canary Brim, MD 12/28/21 2235

## 2021-12-28 NOTE — Anesthesia Procedure Notes (Signed)
Anesthesia Regional Block: Peng block   Pre-Anesthetic Checklist: , timeout performed,  Correct Patient, Correct Site, Correct Laterality,  Correct Procedure, Correct Position, site marked,  Risks and benefits discussed,  Surgical consent,  Pre-op evaluation,  At surgeon's request and post-op pain management  Laterality: Right  Prep: chloraprep       Needles:  Injection technique: Single-shot  Needle Type: Echogenic Needle     Needle Length: 9cm  Needle Gauge: 21     Additional Needles:   Procedures:,,,, ultrasound used (permanent image in chart),,    Narrative:  Start time: 12/28/2021 10:00 PM End time: 12/28/2021 10:05 PM Injection made incrementally with aspirations every 5 mL.  Performed by: Personally  Anesthesiologist: Collene Schlichter, MD  Additional Notes: No pain on injection. No increased resistance to injection. Injection made in 5cc increments.  Good needle visualization.  Patient tolerated procedure well.

## 2021-12-28 NOTE — H&P (Addendum)
History and Physical   BUELL PARCEL IEP:[id number] DOB: 23-Oct-1940 DOA: 12/28/2021  PCP: [facility]   Patient coming from: Home  Chief Complaint: Fall, hip pain  [name] is a 82 y.o. male with medical history significant of atrial fibrillation, hypertension, neuropathy, CKD 3, BPH, chronic pain presenting after fall at home and subsequent hip pain.  Patient reports that he was walking outside in the afternoon and tripped on a garden hose.  He landed on the driveway pavement on his right side, did not hit his head.  He had immediate right hip pain and has been unable to bear weight since.  Has some abrasion on his arms but states this is from gardening yesterday.  He denies fevers, chills, chest pain, shortness of breath, abdominal pain, constipation, diarrhea, nausea, vomiting.  ED Course: Vital signs in the ED significant for blood pressure in the 150s systolic.  Lab work-up including CMP and CBC is still pending.  Chest x-ray showed no acute abnormality.  Right hip x-ray showed acute mildly displaced basicervical fracture of the right hip.  Femur x-ray pending.  Orthopedics has been consulted and will see the patient in the morning with plan for surgery tomorrow.  Patient to be n.p.o. and hold Eliquis.  Surgical team has ordered nerve block for pain control.  Review of Systems: As per HPI otherwise all other systems reviewed and are negative.  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 01/12/2020   Arthritis    "right knee" (01/28/2016)   Atypical atrial flutter (HCC) 05/17/2019   Diverticulosis of colon (without mention of hemorrhage)    Dysrhythmia    Hyperlipidemia    Internal hemorrhoids without mention of complication    Personal history of colonic polyps 01/17/2002   adenomatous, tublar adenoma 02/10/2007   PNA (pneumonia) 03/31/2012    Past Surgical History:  Procedure  Laterality Date   ATRIAL FIBRILLATION ABLATION  11/28/2016   ATRIAL FIBRILLATION ABLATION N/A 11/28/2016   Procedure: Atrial Fibrillation Ablation;  Surgeon: Regan Lemming, MD;  Location: [facility] INVASIVE CV LAB;  Service: Cardiovascular;  Laterality: N/A;   ATRIAL FIBRILLATION ABLATION N/A 03/18/2019   Procedure: ATRIAL FIBRILLATION ABLATION;  Surgeon: Regan Lemming, MD;  Location: [facility] INVASIVE CV LAB;  Service: Cardiovascular;  Laterality: N/A;   CARDIOVERSION  05/03/2012   Procedure: CARDIOVERSION;  Surgeon: [name], MD;  Location: [facility] ENDOSCOPY;  Service: Cardiovascular;  Laterality: N/A;   CARDIOVERSION  06/08/2012   Procedure: CARDIOVERSION;  Surgeon: [name], MD;  Location: [facility] ENDOSCOPY;  Service: Cardiovascular;  Laterality: N/A;  Lawrence Marse[place] Wille Celeste   CARDIOVERSION N/A 10/28/2013   Procedure: CARDIOVERSION;  Surgeon: [name], MD;  Location: [facility] ENDOSCOPY;  Service: Cardiovascular;  Laterality: N/A;   CARDIOVERSION N/A 06/30/2014   Procedure: CARDIOVERSION;  Surgeon: [name], MD;  Location: [facility] ENDOSCOPY;  Service: Cardiovascular;  Laterality: N/A;  H&P in file   CARDIOVERSION N/A 09/05/2014   Procedure: CARDIOVERSION;  Surgeon: [name], MD;  Location: [facility] ENDOSCOPY;  Service: Cardiovascular;  Laterality: N/A;   CARDIOVERSION N/A 12/24/2015   Procedure: CARDIOVERSION;  Surgeon: [name], MD;  Location: [facility] ENDOSCOPY;  Service: Cardiovascular;  Laterality: N/A;   CARDIOVERSION N/A 01/30/2016   Procedure: CARDIOVERSION;  Surgeon: [name], MD;  Location: [facility] ENDOSCOPY;  Service: Cardiovascular;  Laterality: N/A;   CARDIOVERSION N/A 02/08/2016   Procedure: CARDIOVERSION;  Surgeon: [name], MD;  Location: [facility] ENDOSCOPY;  Service: Cardiovascular;  Laterality: N/A;   CARDIOVERSION N/A 02/08/2019   Procedure: CARDIOVERSION;  Surgeon: [name], MD;  Location: [facility] ENDOSCOPY;  Service: Cardiovascular;  Laterality: N/A;   CARDIOVERSION N/A 05/24/2019   Procedure:  CARDIOVERSION;  Surgeon: Parke Poisson, MD;  Location: [facility] ENDOSCOPY;  Service: Cardiovascular;  Laterality: N/A;   CARDIOVERSION N/A 09/21/2019   Procedure: CARDIOVERSION;  Surgeon: [name], MD;  Location: [facility] ENDOSCOPY;  Service: Cardiovascular;  Laterality: N/A;   CARDIOVERSION N/A 12/01/2019   Procedure: CARDIOVERSION;  Surgeon: [name], MD;  Location: [facility] ENDOSCOPY;  Service: Cardiovascular;  Laterality: N/A;   CARDIOVERSION N/A 08/20/2021   Procedure: CARDIOVERSION;  Surgeon: [name], MD;  Location: [facility] ENDOSCOPY;  Service: Cardiovascular;  Laterality: N/A;   CARDIOVERSION N/A 10/18/2021   Procedure: CARDIOVERSION;  Surgeon: [name], MD;  Location: [facility] ENDOSCOPY;  Service: Cardiovascular;  Laterality: N/A;   CATARACT EXTRACTION W/ INTRAOCULAR LENS  IMPLANT, BILATERAL Bilateral    COLONOSCOPY     INGUINAL HERNIA REPAIR Bilateral    as child   KNEE ARTHROSCOPY Right 2004    Social History  reports that he has never smoked. He has never used smokeless tobacco. He reports current alcohol use of about 1.0 standard drink of alcohol per week. He reports that he does not use drugs.  No Known Allergies  Family History  Problem Relation Age of Onset   Cancer Mother        bladder   Bladder Cancer Mother    Heart disease Father    Stroke Father    Colon cancer Neg Hx    Stomach cancer Neg Hx    Esophageal cancer Neg Hx    Liver cancer Neg Hx    Pancreatic cancer Neg Hx   Reviewed on admission  Prior to Admission medications   Medication Sig Start Date End Date Taking? Authorizing Provider  acetaminophen (TYLENOL) 500 MG tablet Take 1,000 mg by mouth every 6 (six) hours as needed for moderate pain or headache.    [provider]  amiodarone (PACERONE) 200 MG tablet Take 1 tablet (200 mg total) by mouth daily. Take twice daily for 10 days then one tab daily. 08/21/21   [name], MD  ELIQUIS 5 MG TABS tablet TAKE 1 TABLET(5 MG) BY MOUTH TWICE DAILY 08/21/21   [name], MD   lisinopril (ZESTRIL) 5 MG tablet TAKE 1 TABLET(5 MG) BY MOUTH DAILY 08/01/21   [name], MD  psyllium (METAMUCIL) 58.6 % packet Take 1 packet by mouth daily as needed (constipation).     [provider]  rosuvastatin (CRESTOR) 5 MG tablet TAKE 1 TABLET(5 MG) BY MOUTH DAILY 03/08/21   [name], MD  tamsulosin (FLOMAX) 0.4 MG CAPS capsule Take 0.4 mg by mouth daily. 10/17/21   [provider]    Physical Exam: Vitals:   12/28/21 1814 12/28/21 1815 12/28/21 1825  BP: (!) 155/88    Pulse: 64    Resp: (!) 22    Temp: 97.7 F (36.5 C)    TempSrc: Oral    SpO2: 96%  95%  Weight:  84.2 kg   Height:  5\' 10"  (1.778 m)     Physical Exam Constitutional:      General: He is not in acute distress.    Appearance: Normal appearance.  HENT:     Head: Normocephalic and atraumatic.     Mouth/Throat:     Mouth: Mucous membranes are moist.  Pharynx: Oropharynx is clear.  Eyes:     Extraocular Movements: Extraocular movements intact.     Pupils: Pupils are equal, round, and reactive to light.  Cardiovascular:     Rate and Rhythm: Normal rate and regular rhythm.     Pulses: Normal pulses.     Heart sounds: Normal heart sounds.  Pulmonary:     Effort: Pulmonary effort is normal. No respiratory distress.     Breath sounds: Normal breath sounds.  Abdominal:     General: Bowel sounds are normal. There is no distension.     Palpations: Abdomen is soft.     Tenderness: There is no abdominal tenderness.  Musculoskeletal:        General: No swelling or deformity.     Comments: Bilateral lower extremities neurovascularly intact.  External rotation of right lower extremity.   Skin:    General: Skin is warm and dry.  Neurological:     General: No focal deficit present.     Mental Status: Mental status is at baseline.     Labs on Admission: I have personally reviewed following labs and imaging studies  CBC: No results for input(s): "WBC", "NEUTROABS", "HGB", "HCT", "MCV",  "PLT" in the last 168 hours.  Basic Metabolic Panel: No results for input(s): "NA", "K", "CL", "CO2", "GLUCOSE", "BUN", "CREATININE", "CALCIUM", "MG", "PHOS" in the last 168 hours.  GFR: CrCl cannot be calculated (Patient's most recent lab result is older than the maximum 21 days allowed.).  Liver Function Tests: No results for input(s): "AST", "ALT", "ALKPHOS", "BILITOT", "PROT", "ALBUMIN" in the last 168 hours.  Urine analysis:    Component Value Date/Time   COLORURINE YELLOW 04/01/2012 [place] 04/01/2012 0634   LABSPEC 1.016 04/01/2012 0634   PHURINE 6.0 04/01/2012 0634   GLUCOSEU NEGATIVE 04/01/2012 0634   HGBUR SMALL (A) 04/01/2012 0634   BILIRUBINUR NEGATIVE 04/01/2012 0634   KETONESUR NEGATIVE 04/01/2012 0634   PROTEINUR NEGATIVE 04/01/2012 0634   UROBILINOGEN 0.2 04/01/2012 0634   NITRITE NEGATIVE 04/01/2012 0634   LEUKOCYTESUR NEGATIVE 04/01/2012 0634    Radiological Exams on Admission: DG FEMUR, MIN 2 VIEWS RIGHT  Result Date: 12/28/2021 CLINICAL DATA:  Tripped and fell EXAM: RIGHT FEMUR 2 VIEWS COMPARISON:  12/28/2021 FINDINGS: Frontal and frogleg lateral views of the right femur are obtained. Minimally displaced basicervical right femoral neck fracture is again identified. No dislocation. The remainder of the right femur is unremarkable. Medial compartmental right knee arthroplasty is identified. Soft tissues are unremarkable. IMPRESSION: 1. Acute right femoral neck fracture as above. 2. Unremarkable partial right knee arthroplasty. Electronically Signed   By: Randa Ngo M.D.   On: 12/28/2021 21:27   DG Hip [place] or [place] Pelvis 1 View Right  Result Date: 12/28/2021 CLINICAL DATA:  Fall, right hip pain EXAM: DG HIP (WITH OR WITHOUT PELVIS) 1V PORT RIGHT COMPARISON:  None Available. FINDINGS: There is an acute, mildly displaced basicervical fracture of the right hip. Right femoral head is still seated within the right acetabulum and right hip  joint space is preserved. Mild left hip degenerative arthritis noted. Pelvis and left hip are intact. Soft tissues are unremarkable. IMPRESSION: Acute, mildly displaced basicervical fracture of the right hip. No dislocation. Electronically Signed   By: Fidela Salisbury M.D.   On: 12/28/2021 20:27   DG Chest Portable 1 View  Result Date: 12/28/2021 CLINICAL DATA:  [place], hip fracture EXAM: PORTABLE CHEST 1 VIEW COMPARISON:  03/31/2012 FINDINGS: Two frontal views of  the chest demonstrate a stable cardiac silhouette. No acute airspace disease, effusion, or pneumothorax. No acute bony abnormalities. IMPRESSION: 1. No acute intrathoracic process. Electronically Signed   By: Randa Ngo M.D.   On: 12/28/2021 20:05    EKG: Not yet performed  Assessment/Plan Principal Problem:   Closed right hip fracture [facility]) Active Problems:   Paroxysmal atrial fibrillation (HCC)   Hypertension   Neuropathy of both feet   CKD (chronic kidney disease) stage 3, GFR 30-59 ml/min (HCC)   BPH with elevated PSA   HLD (hyperlipidemia)   Closed right hip fracture > Patient presenting after a fall onto his driveway after tripping over a hose.  Had immediate right hip pain and unable to bear weight. > Confirmed right hip fracture in the ED.  Orthopedics has been consulted and will see the patient tomorrow with plan for surgery tomorrow. - Monitor on telemetry - Nerve block ordered by orthopedics already - Consider further pain control with as needed Dilaudid if any issues with nerve block - Bedrest - N.p.o. at midnight - Hold home Eliquis - Check PT/INR, type and screen in the morning  Atrial fibrillation - Continue home amiodarone - Hold home Eliquis as above  Hypertension - Continue home lisinopril  CKD 3 > Lab work-up still pending in the ED. - Trend renal function and electrolytes  BPH - Continue home tamsulosin  Hyperlipidemia - Continue home rosuvastatin  DVT prophylaxis: SCDs Code Status:    Full Family Communication:  Updated at bedside  Disposition Plan:   Patient is from:  Home  Anticipated DC to:  Pending clinical course  Anticipated DC date:  2 to 3 days  Anticipated DC barriers: None  Consults called:  Orthopedics, consulted by EDP, will see the patient the morning. Admission status:  Inpatient, telemetry  Severity of Illness: The appropriate patient status for this patient is INPATIENT. Inpatient status is judged to be reasonable and necessary in order to provide the required intensity of service to ensure the patient's safety. The patient's presenting symptoms, physical exam findings, and initial radiographic and laboratory data in the context of their chronic comorbidities is felt to place them at high risk for further clinical deterioration. Furthermore, it is not anticipated that the patient will be medically stable for discharge from the hospital within 2 midnights of admission.   * I certify that at the point of admission it is my clinical judgment that the patient will require inpatient hospital care spanning beyond 2 midnights from the point of admission due to high intensity of service, high risk for further deterioration and high frequency of surveillance required.Marcelyn Bruins MD [facility]  How to contact the [facility] Attending or Consulting provider [place] or covering provider during after hours [place], for this patient?   Check the care team in [facility] and look for a) attending/consulting [facility] provider listed and b) the [facility] team listed Log into [email or web address] and use [place]'s universal password to access. If you do not have the password, please contact the hospital operator. Locate the [facility] provider you are looking for under [facility] and page to a number that you can be directly reached. If you still have difficulty reaching the provider, please page the [facility] (Director on Call) for the Hospitalists listed on amion for  assistance.  12/28/2021, 9:43 PM

## 2021-12-28 NOTE — ED Triage Notes (Addendum)
Pt to ED via [facility] after fall when working out in the garden-tripped over hose and landed on right hip. Denies LOC/N/V. Pt on thinners. Hx of afib.

## 2021-12-29 ENCOUNTER — Encounter (HOSPITAL_COMMUNITY): Payer: Self-pay | Admitting: Internal Medicine

## 2021-12-29 ENCOUNTER — Other Ambulatory Visit: Payer: Self-pay

## 2021-12-29 ENCOUNTER — Encounter (HOSPITAL_COMMUNITY)
Admission: EM | Disposition: A | Discharge status: Home / Self Care | Payer: Self-pay | Source: Home / Self Care | Attending: Internal Medicine

## 2021-12-29 ENCOUNTER — Encounter: Payer: Self-pay | Admitting: Cardiology

## 2021-12-29 ENCOUNTER — Encounter: Payer: Self-pay | Admitting: Physical Therapy

## 2021-12-29 ENCOUNTER — Inpatient Hospital Stay (HOSPITAL_COMMUNITY): Payer: Medicare Other | Admitting: Certified Registered Nurse Anesthetist

## 2021-12-29 ENCOUNTER — Inpatient Hospital Stay (HOSPITAL_COMMUNITY): Payer: Medicare Other

## 2021-12-29 DIAGNOSIS — N400 Enlarged prostate without lower urinary tract symptoms: Secondary | ICD-10-CM | POA: Diagnosis not present

## 2021-12-29 DIAGNOSIS — E785 Hyperlipidemia, unspecified: Secondary | ICD-10-CM | POA: Diagnosis not present

## 2021-12-29 DIAGNOSIS — I4891 Unspecified atrial fibrillation: Secondary | ICD-10-CM

## 2021-12-29 DIAGNOSIS — G5793 Unspecified mononeuropathy of bilateral lower limbs: Secondary | ICD-10-CM

## 2021-12-29 DIAGNOSIS — N289 Disorder of kidney and ureter, unspecified: Secondary | ICD-10-CM

## 2021-12-29 DIAGNOSIS — N1832 Chronic kidney disease, stage 3b: Secondary | ICD-10-CM | POA: Diagnosis not present

## 2021-12-29 DIAGNOSIS — S72001A Fracture of unspecified part of neck of right femur, initial encounter for closed fracture: Secondary | ICD-10-CM | POA: Diagnosis not present

## 2021-12-29 DIAGNOSIS — I10 Essential (primary) hypertension: Secondary | ICD-10-CM

## 2021-12-29 DIAGNOSIS — S72141A Displaced intertrochanteric fracture of right femur, initial encounter for closed fracture: Secondary | ICD-10-CM

## 2021-12-29 HISTORY — PX: INTRAMEDULLARY (IM) NAIL INTERTROCHANTERIC: SHX5875

## 2021-12-29 LAB — BASIC METABOLIC PANEL
Anion gap: 11 (ref 5–15)
BUN: 29 mg/dL — ABNORMAL HIGH (ref 8–23)
CO2: 23 mmol/L (ref 22–32)
Calcium: 8.8 mg/dL — ABNORMAL LOW (ref 8.9–10.3)
Chloride: 106 mmol/L (ref 98–111)
Creatinine, Ser: 1.89 mg/dL — ABNORMAL HIGH (ref 0.61–1.24)
GFR, Estimated: 35 mL/min — ABNORMAL LOW (ref >60)
Glucose, Bld: 138 mg/dL — ABNORMAL HIGH (ref 70–99)
Potassium: 4.5 mmol/L (ref 3.5–5.1)
Sodium: 140 mmol/L (ref 135–145)

## 2021-12-29 LAB — CBC
HCT: 40.3 % (ref 39.0–52.0)
Hemoglobin: 13 g/dL (ref 13.0–17.0)
MCH: 33 pg (ref 26.0–34.0)
MCHC: 32.3 g/dL (ref 30.0–36.0)
MCV: 102.3 fL — ABNORMAL HIGH (ref 80.0–100.0)
Platelets: 152 10*3/uL (ref 150–400)
RBC: 3.94 MIL/uL — ABNORMAL LOW (ref 4.22–5.81)
RDW: 13.8 % (ref 11.5–15.5)
WBC: 6.2 10*3/uL (ref 4.0–10.5)
nRBC: 0 % (ref 0.0–0.2)

## 2021-12-29 LAB — PROTIME-INR
INR: 1.2 (ref 0.8–1.2)
Prothrombin Time: 15.5 seconds — ABNORMAL HIGH (ref 11.4–15.2)

## 2021-12-29 LAB — SURGICAL PCR SCREEN
MRSA, PCR: NEGATIVE
Staphylococcus aureus: NEGATIVE

## 2021-12-29 SURGERY — FIXATION, FRACTURE, INTERTROCHANTERIC, WITH INTRAMEDULLARY ROD
Anesthesia: General | Laterality: Right

## 2021-12-29 MED ORDER — PROPOFOL 10 MG/ML IV BOLUS
INTRAVENOUS | Status: DC | PRN
  Administered 2021-12-29: 100 mg via INTRAVENOUS

## 2021-12-29 MED ORDER — SUGAMMADEX SODIUM 200 MG/2ML IV SOLN
INTRAVENOUS | Status: DC | PRN
  Administered 2021-12-29: 200 mg via INTRAVENOUS

## 2021-12-29 MED ORDER — OXYCODONE HCL 5 MG/5ML PO SOLN: 5.0000 mg | Freq: Once | ORAL | Status: DC | PRN

## 2021-12-29 MED ORDER — PHENYLEPHRINE 80 MCG/ML (10ML) SYRINGE FOR IV PUSH (FOR BLOOD PRESSURE SUPPORT)
PREFILLED_SYRINGE | INTRAVENOUS | Status: DC | PRN
  Administered 2021-12-29 (×2): 80 ug via INTRAVENOUS
  Administered 2021-12-29: 160 ug via INTRAVENOUS

## 2021-12-29 MED ORDER — LACTATED RINGERS IV SOLN: INTRAVENOUS | Status: DC

## 2021-12-29 MED ORDER — METOCLOPRAMIDE HCL 5 MG PO TABS: 5.0000 mg | ORAL_TABLET | Freq: Three times a day (TID) | ORAL | Status: DC | PRN

## 2021-12-29 MED ORDER — ACETAMINOPHEN 10 MG/ML IV SOLN
INTRAVENOUS | Status: AC
  Filled 2021-12-29: qty 100

## 2021-12-29 MED ORDER — ORAL CARE MOUTH RINSE: 15.0000 mL | Freq: Once | OROMUCOSAL | Status: AC

## 2021-12-29 MED ORDER — LIDOCAINE 2% (20 MG/ML) 5 ML SYRINGE
INTRAMUSCULAR | Status: AC
  Filled 2021-12-29: qty 5

## 2021-12-29 MED ORDER — OXYCODONE HCL 5 MG PO TABS
5.0000 mg | ORAL_TABLET | Freq: Four times a day (QID) | ORAL | Status: DC | PRN
  Administered 2021-12-30: 5 mg via ORAL
  Filled 2021-12-29: qty 1

## 2021-12-29 MED ORDER — MEPERIDINE HCL 25 MG/ML IJ SOLN: 6.2500 mg | INTRAMUSCULAR | Status: DC | PRN

## 2021-12-29 MED ORDER — 0.9 % SODIUM CHLORIDE (POUR BTL) OPTIME
TOPICAL | Status: DC | PRN
  Administered 2021-12-29: 1000 mL

## 2021-12-29 MED ORDER — ONDANSETRON HCL 4 MG PO TABS: 4.0000 mg | ORAL_TABLET | Freq: Four times a day (QID) | ORAL | Status: DC | PRN

## 2021-12-29 MED ORDER — PHENYLEPHRINE HCL-NACL 20-0.9 MG/250ML-% IV SOLN
INTRAVENOUS | Status: DC | PRN
  Administered 2021-12-29: 25 ug/min via INTRAVENOUS

## 2021-12-29 MED ORDER — HYDROMORPHONE HCL 1 MG/ML IJ SOLN: 1.0000 mg | INTRAMUSCULAR | Status: DC | PRN

## 2021-12-29 MED ORDER — ONDANSETRON HCL 4 MG/2ML IJ SOLN
INTRAMUSCULAR | Status: DC | PRN
  Administered 2021-12-29: 4 mg via INTRAVENOUS

## 2021-12-29 MED ORDER — MIDAZOLAM HCL 2 MG/2ML IJ SOLN: 0.5000 mg | Freq: Once | INTRAMUSCULAR | Status: DC | PRN

## 2021-12-29 MED ORDER — METOCLOPRAMIDE HCL 5 MG/ML IJ SOLN: 5.0000 mg | Freq: Three times a day (TID) | INTRAMUSCULAR | Status: DC | PRN

## 2021-12-29 MED ORDER — ONDANSETRON HCL 4 MG/2ML IJ SOLN
INTRAMUSCULAR | Status: AC
  Filled 2021-12-29: qty 2

## 2021-12-29 MED ORDER — POVIDONE-IODINE 10 % EX SWAB
2.0000 | Freq: Once | CUTANEOUS | Status: AC
  Administered 2021-12-29: 2 via TOPICAL

## 2021-12-29 MED ORDER — ONDANSETRON HCL 4 MG/2ML IJ SOLN: 4.0000 mg | Freq: Four times a day (QID) | INTRAMUSCULAR | Status: DC | PRN

## 2021-12-29 MED ORDER — DEXAMETHASONE SODIUM PHOSPHATE 10 MG/ML IJ SOLN
INTRAMUSCULAR | Status: AC
  Filled 2021-12-29: qty 1

## 2021-12-29 MED ORDER — PROPOFOL 10 MG/ML IV BOLUS
INTRAVENOUS | Status: AC
  Filled 2021-12-29: qty 20

## 2021-12-29 MED ORDER — FENTANYL CITRATE (PF) 250 MCG/5ML IJ SOLN
INTRAMUSCULAR | Status: AC
  Filled 2021-12-29: qty 5

## 2021-12-29 MED ORDER — CEFAZOLIN SODIUM-DEXTROSE 2-4 GM/100ML-% IV SOLN
2.0000 g | INTRAVENOUS | Status: AC
  Administered 2021-12-29: 2 g via INTRAVENOUS
  Filled 2021-12-29: qty 100

## 2021-12-29 MED ORDER — ROCURONIUM BROMIDE 10 MG/ML (PF) SYRINGE
PREFILLED_SYRINGE | INTRAVENOUS | Status: AC
  Filled 2021-12-29: qty 20

## 2021-12-29 MED ORDER — EPHEDRINE SULFATE-NACL 50-0.9 MG/10ML-% IV SOSY
PREFILLED_SYRINGE | INTRAVENOUS | Status: DC | PRN
  Administered 2021-12-29 (×2): 5 mg via INTRAVENOUS

## 2021-12-29 MED ORDER — CHLORHEXIDINE GLUCONATE 0.12 % MT SOLN
OROMUCOSAL | Status: AC
  Administered 2021-12-29: 15 mL via OROMUCOSAL
  Filled 2021-12-29: qty 15

## 2021-12-29 MED ORDER — FENTANYL CITRATE (PF) 250 MCG/5ML IJ SOLN
INTRAMUSCULAR | Status: DC | PRN
  Administered 2021-12-29 (×2): 50 ug via INTRAVENOUS

## 2021-12-29 MED ORDER — FENTANYL CITRATE (PF) 100 MCG/2ML IJ SOLN: 25.0000 ug | INTRAMUSCULAR | Status: DC | PRN

## 2021-12-29 MED ORDER — OXYCODONE HCL 5 MG PO TABS: 5.0000 mg | ORAL_TABLET | Freq: Once | ORAL | Status: DC | PRN

## 2021-12-29 MED ORDER — ACETAMINOPHEN 10 MG/ML IV SOLN
INTRAVENOUS | Status: DC | PRN
  Administered 2021-12-29: 1000 mg via INTRAVENOUS

## 2021-12-29 MED ORDER — DOCUSATE SODIUM 100 MG PO CAPS
100.0000 mg | ORAL_CAPSULE | Freq: Two times a day (BID) | ORAL | Status: DC
Start: 2021-12-29 — End: 2021-12-30
  Administered 2021-12-29 – 2021-12-30 (×2): 100 mg via ORAL
  Filled 2021-12-29 (×2): qty 1

## 2021-12-29 MED ORDER — PROMETHAZINE HCL 25 MG/ML IJ SOLN: 6.2500 mg | INTRAMUSCULAR | Status: DC | PRN

## 2021-12-29 MED ORDER — ACETAMINOPHEN 325 MG PO TABS: 325.0000 mg | ORAL_TABLET | Freq: Four times a day (QID) | ORAL | Status: DC | PRN

## 2021-12-29 MED ORDER — CHLORHEXIDINE GLUCONATE 4 % EX LIQD: 60.0000 mL | Freq: Once | CUTANEOUS | Status: DC

## 2021-12-29 MED ORDER — CEFAZOLIN SODIUM-DEXTROSE 1-4 GM/50ML-% IV SOLN
1.0000 g | Freq: Four times a day (QID) | INTRAVENOUS | Status: AC
  Administered 2021-12-29 – 2021-12-30 (×3): 1 g via INTRAVENOUS
  Filled 2021-12-29 (×3): qty 50

## 2021-12-29 MED ORDER — LIDOCAINE 2% (20 MG/ML) 5 ML SYRINGE
INTRAMUSCULAR | Status: DC | PRN
  Administered 2021-12-29: 40 mg via INTRAVENOUS

## 2021-12-29 MED ORDER — ROCURONIUM BROMIDE 10 MG/ML (PF) SYRINGE
PREFILLED_SYRINGE | INTRAVENOUS | Status: DC | PRN
  Administered 2021-12-29: 60 mg via INTRAVENOUS
  Administered 2021-12-29: 40 mg via INTRAVENOUS

## 2021-12-29 MED ORDER — ACETAMINOPHEN 500 MG PO TABS
1000.0000 mg | ORAL_TABLET | Freq: Once | ORAL | Status: AC
  Administered 2021-12-29: 1000 mg via ORAL
  Filled 2021-12-29: qty 2

## 2021-12-29 MED ORDER — ACETAMINOPHEN 500 MG PO TABS
500.0000 mg | ORAL_TABLET | Freq: Four times a day (QID) | ORAL | Status: DC
  Administered 2021-12-29 – 2021-12-30 (×3): 500 mg via ORAL
  Filled 2021-12-29 (×3): qty 1

## 2021-12-29 MED ORDER — DEXAMETHASONE SODIUM PHOSPHATE 10 MG/ML IJ SOLN
INTRAMUSCULAR | Status: DC | PRN
  Administered 2021-12-29: 10 mg via INTRAVENOUS

## 2021-12-29 MED ORDER — CHLORHEXIDINE GLUCONATE 0.12 % MT SOLN: 15.0000 mL | Freq: Once | OROMUCOSAL | Status: AC

## 2021-12-29 SURGICAL SUPPLY — 58 items
APL PRP STRL LF DISP 70% ISPRP (MISCELLANEOUS) ×1
BAG COUNTER SPONGE SURGICOUNT (BAG) ×2 IMPLANT
BAG SPNG CNTER NS LX DISP (BAG) ×1
BIT DRILL 4.0X280 (BIT) ×1 IMPLANT
BLADE SURG 10 STRL SS (BLADE) ×4 IMPLANT
BNDG COHESIVE 4X5 TAN STRL (GAUZE/BANDAGES/DRESSINGS) IMPLANT
BNDG COHESIVE 6X5 TAN STRL LF (GAUZE/BANDAGES/DRESSINGS) ×4 IMPLANT
BRUSH SCRUB EZ PLAIN DRY (MISCELLANEOUS) ×4 IMPLANT
CHLORAPREP W/TINT 26 (MISCELLANEOUS) ×2 IMPLANT
COVER MAYO STAND STRL (DRAPES) ×2 IMPLANT
COVER PERINEAL POST (MISCELLANEOUS) ×2 IMPLANT
COVER SURGICAL LIGHT HANDLE (MISCELLANEOUS) ×2 IMPLANT
DRAPE C-ARM 35X43 STRL (DRAPES) ×2 IMPLANT
DRAPE C-ARMOR (DRAPES) ×2 IMPLANT
DRAPE HALF SHEET 40X57 (DRAPES) ×2 IMPLANT
DRAPE IMP U-DRAPE 54X76 (DRAPES) ×4 IMPLANT
DRAPE INCISE IOBAN 66X45 STRL (DRAPES) ×2 IMPLANT
DRAPE STERI IOBAN 125X83 (DRAPES) ×2 IMPLANT
DRAPE SURG 17X23 STRL (DRAPES) ×2 IMPLANT
DRAPE U-SHAPE 47X51 STRL (DRAPES) ×2 IMPLANT
DRESSING MEPILEX FLEX 4X4 (GAUZE/BANDAGES/DRESSINGS) IMPLANT
DRSG MEPILEX BORDER 4X4 (GAUZE/BANDAGES/DRESSINGS) ×3 IMPLANT
DRSG MEPILEX BORDER 4X8 (GAUZE/BANDAGES/DRESSINGS) ×2 IMPLANT
DRSG MEPILEX FLEX 4X4 (GAUZE/BANDAGES/DRESSINGS) ×4
DRSG XEROFORM 1X8 (GAUZE/BANDAGES/DRESSINGS) ×1 IMPLANT
ELECT REM PT RETURN 9FT ADLT (ELECTROSURGICAL) ×2
ELECTRODE REM PT RTRN 9FT ADLT (ELECTROSURGICAL) ×1 IMPLANT
GAUZE SPONGE 4X4 12PLY STRL (GAUZE/BANDAGES/DRESSINGS) ×1 IMPLANT
GLOVE BIOGEL M STRL SZ7.5 (GLOVE) ×2 IMPLANT
GLOVE BIOGEL PI IND STRL 7.5 (GLOVE) IMPLANT
GLOVE BIOGEL PI IND STRL 8 (GLOVE) ×1 IMPLANT
GLOVE BIOGEL PI INDICATOR 7.5 (GLOVE) ×2
GLOVE BIOGEL PI INDICATOR 8 (GLOVE) ×2
GOWN STRL REUS W/ TWL LRG LVL3 (GOWN DISPOSABLE) ×1 IMPLANT
GOWN STRL REUS W/ TWL XL LVL3 (GOWN DISPOSABLE) ×1 IMPLANT
GOWN STRL REUS W/TWL LRG LVL3 (GOWN DISPOSABLE) ×2
GOWN STRL REUS W/TWL XL LVL3 (GOWN DISPOSABLE) ×2
KIT BASIN OR (CUSTOM PROCEDURE TRAY) ×2 IMPLANT
KIT TURNOVER KIT B (KITS) ×2 IMPLANT
MANIFOLD NEPTUNE II (INSTRUMENTS) ×2 IMPLANT
NAIL 9X130X20 SHORT (Nail) ×1 IMPLANT
NS IRRIG 1000ML POUR BTL (IV SOLUTION) ×2 IMPLANT
PACK GENERAL/GYN (CUSTOM PROCEDURE TRAY) ×2 IMPLANT
PAD ARMBOARD 7.5X6 YLW CONV (MISCELLANEOUS) ×4 IMPLANT
PIN GUIDE THRD AR 3.2X330 (PIN) ×1 IMPLANT
SCREW LAG GALILEO 10.5X105 (Screw) ×1 IMPLANT
SCREW LOCK CORT 5.0X40 (Screw) ×2 IMPLANT
STAPLER VISISTAT 35W (STAPLE) ×3 IMPLANT
SUT MNCRL AB 3-0 PS2 27 (SUTURE) ×1 IMPLANT
SUT MON AB 3-0 SH 27 (SUTURE) ×2
SUT MON AB 3-0 SH27 (SUTURE) ×1 IMPLANT
SUT PDS AB 2-0 CT1 27 (SUTURE) ×1 IMPLANT
SUT VIC AB 2-0 CT2 27 (SUTURE) ×1 IMPLANT
TOOL ACTIVATION (INSTRUMENTS) ×1 IMPLANT
TOWEL GREEN STERILE (TOWEL DISPOSABLE) ×4 IMPLANT
TOWEL GREEN STERILE FF (TOWEL DISPOSABLE) ×2 IMPLANT
UNDERPAD 30X36 HEAVY ABSORB (UNDERPADS AND DIAPERS) ×1 IMPLANT
WATER STERILE IRR 1000ML POUR (IV SOLUTION) ×2 IMPLANT

## 2021-12-29 NOTE — Hospital Course (Signed)
HPI per Dr. Beola Cord on 12/28/21  Gregory [name] is a 81 y.o. male with medical history significant of atrial fibrillation, hypertension, neuropathy, CKD 3, BPH, chronic pain presenting after fall at home and subsequent hip pain.  Patient reports that he was walking outside in the afternoon and tripped on a garden hose.  He landed on the driveway pavement on his right side, did not hit his head.  He had immediate right hip pain and has been unable to bear weight since.  Has some abrasion on his arms but states this is from gardening yesterday.  He denies fevers, chills, chest pain, shortness of breath, abdominal pain, constipation, diarrhea, nausea, vomiting.   ED Course: Vital signs in the ED significant for blood pressure in the 150s systolic.  Lab work-up including CMP and CBC is still pending.  Chest x-ray showed no acute abnormality.  Right hip x-ray showed acute mildly displaced basicervical fracture of the right hip.  Femur x-ray pending.  Orthopedics has been consulted and will see the patient in the morning with plan for surgery tomorrow.  Patient to be n.p.o. and hold Eliquis.  Surgical team has ordered nerve block for pain control.  **Interim History Patient underwent surgical intervention and he did quite well improved significantly.  PT OT recommended outpatient physical therapy and he was resumed on his apixaban at half the dose given his renal dysfunction.  Prior to discharge his creatinine did go up slightly and his blood count did drop but he is medically stable for discharge with outpatient close follow-up and repeat CBC and CMP within a week.  Orthopedic surgery recommended following up in outpatient setting and case was discussed with his primary cardiologist who will see him shortly after discharge.  Patient is stable for discharge at this time and will need to follow-up with his PCP, orthopedic surgery as well as cardiology in outpatient setting.

## 2021-12-29 NOTE — Transfer of Care (Signed)
Immediate Anesthesia Transfer of Care Note  Patient: Gregory [name]  Procedure(s) Performed: INTRAMEDULLARY (IM) NAIL INTERTROCHANTRIC (Right)  Patient Location: PACU  Anesthesia Type:General  Level of Consciousness: awake, alert  and oriented  Airway & Oxygen Therapy: Patient Spontanous Breathing  Post-op Assessment: Report given to RN and Post -op Vital signs reviewed and stable  Post vital signs: Reviewed and stable  Last Vitals:  Vitals Value Taken Time  BP 128/72 12/29/21 1155  Temp    Pulse 75 12/29/21 1156  Resp    SpO2 94 % 12/29/21 1156  Vitals shown include unvalidated device data.  Last Pain:  Vitals:   12/29/21 0955  TempSrc:   PainSc: 3          Complications: No notable events documented.

## 2021-12-29 NOTE — Anesthesia Preprocedure Evaluation (Addendum)
Anesthesia Evaluation  Patient identified by MRN, date of birth, ID band Patient awake    Reviewed: Allergy & Precautions, NPO status , Patient's Chart, lab work & pertinent test results  History of Anesthesia Complications Negative for: history of anesthetic complications  Airway Mallampati: I  TM Distance: >3 FB Neck ROM: Full    Dental  (+) Teeth Intact, Dental Advisory Given   Pulmonary neg pulmonary ROS,    breath sounds clear to auscultation       Cardiovascular hypertension, Pt. on medications (-) angina+ dysrhythmias Atrial Fibrillation  Rhythm:Irregular Rate:Normal  '21 ECHO: Left ventricle cavity is normal in size. Mild concentric LVH. Normal global wall motion. Normal LV systolic function with EF 61%, mod MR, mod TR   Neuro/Psych negative neurological ROS     GI/Hepatic negative GI ROS, Neg liver ROS,   Endo/Other  negative endocrine ROS  Renal/GU Renal InsufficiencyRenal disease     Musculoskeletal   Abdominal   Peds  Hematology Eliquis: last dose yesterday   Anesthesia Other Findings   Reproductive/Obstetrics                            Anesthesia Physical Anesthesia Plan  ASA: 3  Anesthesia Plan: General   Post-op Pain Management: Tylenol PO (pre-op)*   Induction: Intravenous  PONV Risk Score and Plan: 2 and Ondansetron and Dexamethasone  Airway Management Planned: Oral ETT  Additional Equipment: None  Intra-op Plan:   Post-operative Plan: Extubation in OR  Informed Consent: I have reviewed the patients History and Physical, chart, labs and discussed the procedure including the risks, benefits and alternatives for the proposed anesthesia with the patient or authorized representative who has indicated his/her understanding and acceptance.     Dental advisory given  Plan Discussed with: CRNA and Surgeon  Anesthesia Plan Comments:        Anesthesia Quick  Evaluation

## 2021-12-29 NOTE — Anesthesia Postprocedure Evaluation (Signed)
Anesthesia Post Note  Patient: Gregory [name]  Procedure(s) Performed: INTRAMEDULLARY (IM) NAIL INTERTROCHANTRIC (Right)     Patient location during evaluation: PACU Anesthesia Type: General Level of consciousness: awake and alert, patient cooperative and oriented Pain management: pain level controlled Vital Signs Assessment: post-procedure vital signs reviewed and stable Respiratory status: spontaneous breathing, nonlabored ventilation, respiratory function stable and patient connected to nasal cannula oxygen Cardiovascular status: blood pressure returned to baseline and stable Postop Assessment: no apparent nausea or vomiting Anesthetic complications: no   No notable events documented.  Last Vitals:  Vitals:   12/29/21 1200 12/29/21 1215  BP: 123/69 114/70  Pulse: 74 74  Resp: 14 17  Temp:    SpO2: 91% 93%    Last Pain:  Vitals:   12/29/21 1215  TempSrc:   PainSc: 0-No pain                 Westen Dinino,E. Marisol Giambra

## 2021-12-29 NOTE — ED Notes (Signed)
Wife is at bedside. Pt and wife states that's surgeon has not come to explain procedure and will not sign consent at this time. RN reached out to Careers adviser.

## 2021-12-29 NOTE — Progress Notes (Signed)
PROGRESS NOTE    Gregory [name]  GNO:[id number] DOB: Nov 15, 1940 DOA: 12/28/2021 PCP: [facility]   Brief Narrative:  HPI per Dr. Beola Cord on 12/28/21  Gregory [name] is a 81 y.o. male with medical history significant of atrial fibrillation, hypertension, neuropathy, CKD 3, BPH, chronic pain presenting after fall at home and subsequent hip pain.  Patient reports that he was walking outside in the afternoon and tripped on a garden hose.  He landed on the driveway pavement on his right side, did not hit his head.  He had immediate right hip pain and has been unable to bear weight since.  Has some abrasion on his arms but states this is from gardening yesterday.  He denies fevers, chills, chest pain, shortness of breath, abdominal pain, constipation, diarrhea, nausea, vomiting.   ED Course: Vital signs in the ED significant for blood pressure in the 150s systolic.  Lab work-up including CMP and CBC is still pending.  Chest x-ray showed no acute abnormality.  Right hip x-ray showed acute mildly displaced basicervical fracture of the right hip.  Femur x-ray pending.  Orthopedics has been consulted and will see the patient in the morning with plan for surgery tomorrow.  Patient to be n.p.o. and hold Eliquis.  Surgical team has ordered nerve block for pain control.  **Interim History      Assessment and Plan:  Closed right hip fracture -Patient presenting after a fall onto his driveway after tripping over a hose.  Had immediate right hip pain and difficulty bear weight. -Confirmed right hip fracture in the ED.  Orthopedics has been consulted have seen the patient as patient has undergone cephalomedullary nail of the right hip fracture -Orthopedic surgery evaluation recommendations appreciated and recommending weightbearing as tolerated on the operative extremity; they are recommending following up in 2 weeks of the operative hip and suture  removal -Continue to monitor on telemetry -Nerve block ordered by orthopedics already -Continue with acetaminophen 1000 mg once and 5 mg p.o. every 6 scheduled for 4 doses and then started on 325-650 mg every 6 hours as needed mild pain -Consider further pain control with as needed Dilaudid if any issues with nerve block; currently on hydromorphone 1 mg every 4 as needed severe pain and also has oxycodone 5 mg p.o. every 6 as needed for moderate pain -Hold home Eliquis and resume when okay with orthopedic surgery -Bowel regimen initiated with docusate 100 mils p.o. twice daily and MiraLAX 17 g p.o. daily as needed for mild constipation -Check PT/INR, type and screen in the morning; PT/INR was 15.5-1.2 respectively   Paroxysmal atrial fibrillation and is currently in normal sinus rhythm -Continue home Amiodarone 200 mg 2 daily -Hold home Eliquis as above and resume when okay with orthopedic surgery  Hypertension -Continue home Lisinopril 5 mg p.o. daily -Continue to monitor blood pressures per protocol -Last blood pressure reading was 111/72  CKD 3b -Patient's BUNs/creatinine went from 30/1.98 is now 29/1.89 -Avoid nephrotoxic medications, contrast dyes, hypotension and dehydration to ensure adequate renal perfusion and renally adjust medications -Repeat CMP in a.m.  BPH -Continue home Tamsulosin 0.4 mg p.o. daily  Hyperlipidemia -Continue home Rosuvastatin 5 mg p.o. daily  Macrocytosis without anemia -Expect some postoperative drop in hemoglobin given his surgical intervention -Continue monitor trend and repeat CBC in a.m.  Hyperglycemia -Patient blood sugars ranging from 138-158 on daily BMP/CMP -Continue monitor and trend and if necessary will place on sensitive NovoLog sign scale insulin  DVT prophylaxis: SCDs Start: 12/28/21 2123    Code Status: Full Code Family Communication: Discussed with wife at bedside  Disposition Plan:  Level of care: Telemetry Surgical Status  is: Inpatient Remains inpatient appropriate because: Needs PT and OT to further evaluate and treat   Consultants:  Orthopedic surgery Discussed the case with Dr. Jacinto Halim of Cardiology  Procedures:  PROCEDURE: Cephalomedullary nail of right hip fracture  Antimicrobials:  Anti-infectives (From admission, onward)    Start     Dose/Rate Route Frequency Ordered Stop   12/29/21 1000  ceFAZolin (ANCEF) IVPB 2g/100 mL premix        2 g 200 mL/hr over 30 Minutes Intravenous On call to O.R. 12/29/21 8119 12/30/21 0559       Subjective: Seen and examined at bedside after surgical intervention he is asking about his Eliquis being resumed.  He denies chest pain or shortness breath.  States that his pain is fairly well controlled currently at around 1 or 2.  Denies any nausea or vomiting.  No other concerns or complaints this time.  Objective: Vitals:   12/29/21 0600 12/29/21 0700 12/29/21 0715 12/29/21 0909  BP: 136/89 124/63  136/76  Pulse: 73 72 76 69  Resp: (!) 38 (!) 31 (!) 22 (!) 23  Temp:      TempSrc:      SpO2: 95% 92% 91% 95%  Weight:      Height:       No intake or output data in the 24 hours ending 12/29/21 0917 Filed Weights   12/28/21 1815  Weight: 84.2 kg   Examination: Physical Exam:  Constitutional: WN/WD overweight Caucasian male currently no acute distress appears calm and comfortable after surgical intervention Respiratory: Diminished to auscultation bilaterally with coarse breath sounds, no wheezing, rales, rhonchi or crackles. Normal respiratory effort and patient is not tachypenic. No accessory muscle use.  Unlabored breathing but is wearing supplemental oxygen via nasal cannula after his Cardiovascular: RRR, no murmurs / rubs / gallops. S1 and S2 auscultated. No extremity edema.  Abdomen: Soft, non-tender, slightly distended secondary body habitus. Bowel sounds positive.  GU: Deferred. Musculoskeletal: No clubbing / cyanosis of digits/nails. No joint deformity  upper and lower extremities.  Neurologic: CN 2-12 grossly intact with no focal deficits. Romberg sign and cerebellar reflexes not assessed.  Psychiatric: Normal judgment and insight. Alert and oriented x 3. Normal mood and appropriate affect.   Data Reviewed: I have personally reviewed following labs and imaging studies  CBC: Recent Labs  Lab 12/28/21 2135 12/29/21 0341  WBC 8.4 6.2  NEUTROABS 6.7  --   HGB 13.1 13.0  HCT 40.4 40.3  MCV 101.8* 102.3*  PLT 159 152   Basic Metabolic Panel: Recent Labs  Lab 12/28/21 2135 12/29/21 0341  NA 141 140  K 3.8 4.5  CL 110 106  CO2 22 23  GLUCOSE 158* 138*  BUN 30* 29*  CREATININE 1.98* 1.89*  CALCIUM 8.9 8.8*   GFR: Estimated Creatinine Clearance: 32.2 mL/min (A) (by C-G formula based on SCr of 1.89 mg/dL (H)). Liver Function Tests: Recent Labs  Lab 12/28/21 2135  AST 19  ALT 20  ALKPHOS 49  BILITOT 0.7  PROT 6.2*  ALBUMIN 3.7   No results for input(s): "LIPASE", "AMYLASE" in the last 168 hours. No results for input(s): "AMMONIA" in the last 168 hours. Coagulation Profile: Recent Labs  Lab 12/29/21 0341  INR 1.2   Cardiac Enzymes: No results for input(s): "CKTOTAL", "CKMB", "CKMBINDEX", "TROPONINI" in  the last 168 hours. BNP (last 3 results) No results for input(s): "PROBNP" in the last 8760 hours. HbA1C: No results for input(s): "HGBA1C" in the last 72 hours. CBG: No results for input(s): "GLUCAP" in the last 168 hours. Lipid Profile: No results for input(s): "CHOL", "HDL", "LDLCALC", "TRIG", "CHOLHDL", "LDLDIRECT" in the last 72 hours. Thyroid Function Tests: No results for input(s): "TSH", "T4TOTAL", "FREET4", "T3FREE", "THYROIDAB" in the last 72 hours. Anemia Panel: No results for input(s): "VITAMINB12", "FOLATE", "FERRITIN", "TIBC", "IRON", "RETICCTPCT" in the last 72 hours. Sepsis Labs: No results for input(s): "PROCALCITON", "LATICACIDVEN" in the last 168 hours.  No results found for this or any  previous visit (from the past 240 hour(s)).   Radiology Studies: DG FEMUR, MIN 2 VIEWS RIGHT  Result Date: 12/28/2021 CLINICAL DATA:  Tripped and fell EXAM: RIGHT FEMUR 2 VIEWS COMPARISON:  12/28/2021 FINDINGS: Frontal and frogleg lateral views of the right femur are obtained. Minimally displaced basicervical right femoral neck fracture is again identified. No dislocation. The remainder of the right femur is unremarkable. Medial compartmental right knee arthroplasty is identified. Soft tissues are unremarkable. IMPRESSION: 1. Acute right femoral neck fracture as above. 2. Unremarkable partial right knee arthroplasty. Electronically Signed   By: Sharlet Salina M.D.   On: 12/28/2021 21:27   DG Hip [place] or [place] Pelvis 1 View Right  Result Date: 12/28/2021 CLINICAL DATA:  Fall, right hip pain EXAM: DG HIP (WITH OR WITHOUT PELVIS) 1V PORT RIGHT COMPARISON:  None Available. FINDINGS: There is an acute, mildly displaced basicervical fracture of the right hip. Right femoral head is still seated within the right acetabulum and right hip joint space is preserved. Mild left hip degenerative arthritis noted. Pelvis and left hip are intact. Soft tissues are unremarkable. IMPRESSION: Acute, mildly displaced basicervical fracture of the right hip. No dislocation. Electronically Signed   By: Helyn Numbers M.D.   On: 12/28/2021 20:27   DG Chest Portable 1 View  Result Date: 12/28/2021 CLINICAL DATA:  Larey Seat, hip fracture EXAM: PORTABLE CHEST 1 VIEW COMPARISON:  03/31/2012 FINDINGS: Two frontal views of the chest demonstrate a stable cardiac silhouette. No acute airspace disease, effusion, or pneumothorax. No acute bony abnormalities. IMPRESSION: 1. No acute intrathoracic process. Electronically Signed   By: Sharlet Salina M.D.   On: 12/28/2021 20:05    Scheduled Meds:  amiodarone  200 mg Oral Daily   chlorhexidine  60 mL Topical Once   lisinopril  5 mg Oral Daily   povidone-iodine  2 Application Topical Once    rosuvastatin  5 mg Oral Daily   tamsulosin  0.4 mg Oral Daily   Continuous Infusions:   ceFAZolin (ANCEF) IV      LOS: 1 day   Marguerita Merles, DO [facility] Available via Epic secure chat 7am-7pm After these hours, please refer to coverage provider listed on [email or web address] 12/29/2021, 9:17 AM

## 2021-12-29 NOTE — ED Notes (Signed)
The pt is upset that his bed is not ready upstairs  he is scheduled for hip surgery at 1000am today  he was not happy also that he had not had his am meds  none are ordered   he reports that at the surgical center when he has surgery he always gets his am meds before surgery  I have no comment for him  he has not met the doctor who is performing his surgery and he is concerned about that.

## 2021-12-29 NOTE — Anesthesia Procedure Notes (Signed)
Procedure Name: Intubation Date/Time: 12/29/2021 10:26 AM  Performed by: Dorthea Cove, CRNAPre-anesthesia Checklist: Patient identified, Emergency Drugs available, Suction available and Patient being monitored Patient Re-evaluated:Patient Re-evaluated prior to induction Oxygen Delivery Method: Circle system utilized Preoxygenation: Pre-oxygenation with 100% oxygen Induction Type: IV induction Ventilation: Mask ventilation without difficulty Laryngoscope Size: Mac and 4 Grade View: Grade I Tube type: Oral Tube size: 7.5 mm Number of attempts: 1 Airway Equipment and Method: Stylet and Oral airway Placement Confirmation: ETT inserted through vocal cords under direct vision, positive ETCO2 and breath sounds checked- equal and bilateral Secured at: 23 cm Tube secured with: Tape Dental Injury: Teeth and Oropharynx as per pre-operative assessment

## 2021-12-29 NOTE — Plan of Care (Signed)

## 2021-12-29 NOTE — Consult Note (Signed)
Reason for Consult: Right intertrochanteric hip fracture Referring Physician: [facility] department  Gregory [name] is an 81 y.o. male.  HPI: Patient is an 81 year old male who fell at home onto concrete onto his right hip.  He had immediate pain and was unable to bear weight.  He was brought to the emergency department where x-rays revealed a basicervical/intertrochanteric hip fracture.  He was admitted to the hospitalist team with plans for surgery.  On my evaluation patient complains of pain in the right hip that is extending into the groin.  He has a history of partial knee arthroplasty within the last year as well as some ligamentous reconstructive surgery of which they are unsure what it was specifically.  He denies any other joint or extremity injury.  He does have a history of A-fib and takes Eliquis.  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 01/12/2020   Arthritis    "right knee" (01/28/2016)   Atypical atrial flutter (HCC) 05/17/2019   Diverticulosis of colon (without mention of hemorrhage)    Dysrhythmia    Hyperlipidemia    Internal hemorrhoids without mention of complication    Personal history of colonic polyps 01/17/2002   adenomatous, tublar adenoma 02/10/2007   PNA (pneumonia) 03/31/2012    Past Surgical History:  Procedure Laterality Date   ATRIAL FIBRILLATION ABLATION  11/28/2016   ATRIAL FIBRILLATION ABLATION N/A 11/28/2016   Procedure: Atrial Fibrillation Ablation;  Surgeon: Regan Lemming, MD;  Location[facility] INVASIVE CV LAB;  Service: Cardiovascular;  Laterality: N/A;   ATRIAL FIBRILLATION ABLATION N/A 03/18/2019   Procedure: ATRIAL FIBRILLATION ABLATION;  Surgeon: Regan Lemming, MD;  Location: [facility] INVASIVE CV LAB;  Service: Cardiovascular;  Laterality: N/A;   CARDIOVERSION  05/03/2012   Procedure: CARDIOVERSION;  Surgeon: [name], MD;  Location: [facility] ENDOSCOPY;  Service:  Cardiovascular;  Laterality: N/A;   CARDIOVERSION  06/08/2012   Procedure: CARDIOVERSION;  Surgeon: [name], MD;  Location: [facility] ENDOSCOPY;  Service: Cardiovascular;  Laterality: N/A;  Lawrence [place]se[place] Wille Celeste   CARDIOVERSION N/A 10/28/2013   Procedure: CARDIOVERSION;  Surgeon: [name], MD;  Location: [facility] ENDOSCOPY;  Service: Cardiovascular;  Laterality: N/A;   CARDIOVERSION N/A 06/30/2014   Procedure: CARDIOVERSION;  Surgeon: [name], MD;  Location: [facility] ENDOSCOPY;  Service: Cardiovascular;  Laterality: N/A;  H&P in file   CARDIOVERSION N/A 09/05/2014   Procedure: CARDIOVERSION;  Surgeon: [name], MD;  Location: [facility] ENDOSCOPY;  Service: Cardiovascular;  Laterality: N/A;   CARDIOVERSION N/A 12/24/2015   Procedure: CARDIOVERSION;  Surgeon: [name], MD;  Location: [facility] ENDOSCOPY;  Service: Cardiovascular;  Laterality: N/A;   CARDIOVERSION N/A 01/30/2016   Procedure: CARDIOVERSION;  Surgeon: [name], MD;  Location: [facility] ENDOSCOPY;  Service: Cardiovascular;  Laterality: N/A;   CARDIOVERSION N/A 02/08/2016   Procedure: CARDIOVERSION;  Surgeon: [name], MD;  Location: [facility] ENDOSCOPY;  Service: Cardiovascular;  Laterality: N/A;   CARDIOVERSION N/A 02/08/2019   Procedure: CARDIOVERSION;  Surgeon: [name], MD;  Location: [facility] ENDOSCOPY;  Service: Cardiovascular;  Laterality: N/A;   CARDIOVERSION N/A 05/24/2019   Procedure: CARDIOVERSION;  Surgeon: Parke Poisson, MD;  Location: [facility] ENDOSCOPY;  Service: Cardiovascular;  Laterality: N/A;   CARDIOVERSION N/A 09/21/2019   Procedure: CARDIOVERSION;  Surgeon: [name], MD;  Location: [facility] ENDOSCOPY;  Service: Cardiovascular;  Laterality: N/A;   CARDIOVERSION N/A 12/01/2019   Procedure: CARDIOVERSION;  Surgeon: [name], MD;  Location: [facility] ENDOSCOPY;  Service: Cardiovascular;  Laterality: N/A;   CARDIOVERSION N/A 08/20/2021   Procedure: CARDIOVERSION;  Surgeon: [name], MD;  Location: [facility] ENDOSCOPY;  Service: Cardiovascular;  Laterality: N/A;    CARDIOVERSION N/A 10/18/2021   Procedure: CARDIOVERSION;  Surgeon: [name], MD;  Location: [facility] ENDOSCOPY;  Service: Cardiovascular;  Laterality: N/A;   CATARACT EXTRACTION W/ INTRAOCULAR LENS  IMPLANT, BILATERAL Bilateral    COLONOSCOPY     INGUINAL HERNIA REPAIR Bilateral    as child   KNEE ARTHROSCOPY Right 2004    Family History  Problem Relation Age of Onset   Cancer Mother        bladder   Bladder Cancer Mother    Heart disease Father    Stroke Father    Colon cancer Neg Hx    Stomach cancer Neg Hx    Esophageal cancer Neg Hx    Liver cancer Neg Hx    Pancreatic cancer Neg Hx     Social History:  reports that he has never smoked. He has never used smokeless tobacco. He reports current alcohol use of about 1.0 standard drink of alcohol per week. He reports that he does not use drugs.  Allergies: No Known Allergies  Medications: I have reviewed the patient's current medications.  Results for orders placed or performed during the hospital encounter of 12/28/21 (from the past 48 hour(s))  CBC with Differential     Status: Abnormal   Collection Time: 12/28/21  9:35 PM  Result Value Ref Range   WBC 8.4 4.0 - 10.5 K/uL   RBC 3.97 (L) 4.22 - 5.81 MIL/uL   Hemoglobin 13.1 13.0 - 17.0 g/dL   HCT 97.9 89.2 - 11.9 %   MCV 101.8 (H) 80.0 - 100.0 fL   MCH 33.0 26.0 - 34.0 pg   MCHC 32.4 30.0 - 36.0 g/dL   RDW 41.7 40.8 - 14.4 %   Platelets 159 150 - 400 K/uL   nRBC 0.0 0.0 - 0.2 %   Neutrophils Relative % 80 %   Neutro Abs 6.7 1.7 - 7.7 K/uL   Lymphocytes Relative 15 %   Lymphs Abs 1.3 0.7 - 4.0 K/uL   Monocytes Relative 4 %   Monocytes Absolute 0.4 0.1 - 1.0 K/uL   Eosinophils Relative 1 %   Eosinophils Absolute 0.1 0.0 - 0.5 K/uL   Basophils Relative 0 %   Basophils Absolute 0.0 0.0 - 0.1 K/uL   Immature Granulocytes 0 %   Abs Immature Granulocytes 0.02 0.00 - 0.07 K/uL    Comment: Performed at [facility], [address] [place], [place] [address]   Comprehensive metabolic panel     Status: Abnormal   Collection Time: 12/28/21  9:35 PM  Result Value Ref Range   Sodium 141 135 - 145 mmol/L   Potassium 3.8 3.5 - 5.1 mmol/L   Chloride 110 98 - 111 mmol/L   CO2 22 22 - 32 mmol/L   Glucose, Bld 158 (H) 70 - 99 mg/dL    Comment: Glucose reference range applies only to samples taken after fasting for at least 8 hours.   BUN 30 (H) 8 - 23 mg/dL   Creatinine, Ser 3.14 (H) 0.61 - 1.24 mg/dL   Calcium 8.9 8.9 - 97.0 mg/dL   Total Protein 6.2 (L) 6.5 - 8.1 g/dL   Albumin 3.7 3.5 - 5.0 g/dL   AST 19 15 - 41 U/L   ALT 20 0 - 44 U/L   Alkaline Phosphatase 49  38 - 126 U/L   Total Bilirubin 0.7 0.3 - 1.2 mg/dL   GFR, Estimated 34 (L) >60 mL/min    Comment: (NOTE) Calculated using the CKD-EPI Creatinine Equation (2021)    Anion gap 9 5 - 15    Comment: Performed at [facility], [address] [place], [place] [address]  Type and screen [facility]     Status: None   Collection Time: 12/28/21  9:35 PM  Result Value Ref Range   ABO/RH(D) A NEG    Antibody Screen NEG    Sample Expiration      12/31/2021,2359 Performed at [facility], [address], [place], [place] [address]   Protime-INR     Status: Abnormal   Collection Time: 12/29/21  3:41 AM  Result Value Ref Range   Prothrombin Time 15.5 (H) 11.4 - 15.2 seconds   INR 1.2 0.8 - 1.2    Comment: (NOTE) INR goal varies based on device and disease states. Performed at [facility], [address] [place], [place] [address]   CBC     Status: Abnormal   Collection Time: 12/29/21  3:41 AM  Result Value Ref Range   WBC 6.2 4.0 - 10.5 K/uL   RBC 3.94 (L) 4.22 - 5.81 MIL/uL   Hemoglobin 13.0 13.0 - 17.0 g/dL   HCT 76.7 34.1 - 93.7 %   MCV 102.3 (H) 80.0 - 100.0 fL   MCH 33.0 26.0 - 34.0 pg   MCHC 32.3 30.0 - 36.0 g/dL   RDW 90.2 40.9 - 73.5 %   Platelets 152 150 - 400 K/uL   nRBC 0.0 0.0 - 0.2 %    Comment: Performed at [facility]  Lab, [address], [place], [place] [address]  Basic metabolic panel     Status: Abnormal   Collection Time: 12/29/21  3:41 AM  Result Value Ref Range   Sodium 140 135 - 145 mmol/L   Potassium 4.5 3.5 - 5.1 mmol/L   Chloride 106 98 - 111 mmol/L   CO2 23 22 - 32 mmol/L   Glucose, Bld 138 (H) 70 - 99 mg/dL    Comment: Glucose reference range applies only to samples taken after fasting for at least 8 hours.   BUN 29 (H) 8 - 23 mg/dL   Creatinine, Ser 4.26 (H) 0.61 - 1.24 mg/dL   Calcium 8.8 (L) 8.9 - 10.3 mg/dL   GFR, Estimated 35 (L) >60 mL/min    Comment: (NOTE) Calculated using the CKD-EPI Creatinine Equation (2021)    Anion gap 11 5 - 15    Comment: Performed at [facility], [address] [place], [place] [address]    DG FEMUR, MIN 2 VIEWS RIGHT  Result Date: 12/28/2021 CLINICAL DATA:  Tripped and fell EXAM: RIGHT FEMUR 2 VIEWS COMPARISON:  12/28/2021 FINDINGS: Frontal and frogleg lateral views of the right femur are obtained. Minimally displaced basicervical right femoral neck fracture is again identified. No dislocation. The remainder of the right femur is unremarkable. Medial compartmental right knee arthroplasty is identified. Soft tissues are unremarkable. IMPRESSION: 1. Acute right femoral neck fracture as above. 2. Unremarkable partial right knee arthroplasty. Electronically Signed   By: Sharlet Salina M.D.   On: 12/28/2021 21:27   DG Hip [place] W or [place] Pelvis 1 View Right  Result Date: 12/28/2021 CLINICAL DATA:  Fall, right hip pain EXAM: DG HIP (WITH OR WITHOUT PELVIS) 1V PORT RIGHT  COMPARISON:  None Available. FINDINGS: There is an acute, mildly displaced basicervical fracture of the right hip. Right femoral head is still seated within the right acetabulum and right hip joint space is preserved. Mild left hip degenerative arthritis noted. Pelvis and left hip are intact. Soft tissues are unremarkable. IMPRESSION: Acute, mildly displaced basicervical fracture of the  right hip. No dislocation. Electronically Signed   By: Helyn Numbers M.D.   On: 12/28/2021 20:27   DG Chest Portable 1 View  Result Date: 12/28/2021 CLINICAL DATA:  Larey Seat, hip fracture EXAM: PORTABLE CHEST 1 VIEW COMPARISON:  03/31/2012 FINDINGS: Two frontal views of the chest demonstrate a stable cardiac silhouette. No acute airspace disease, effusion, or pneumothorax. No acute bony abnormalities. IMPRESSION: 1. No acute intrathoracic process. Electronically Signed   By: Sharlet Salina M.D.   On: 12/28/2021 20:05    Review of Systems  Constitutional: Negative.   HENT: Negative.    Eyes: Negative.   Respiratory: Negative.    Cardiovascular: Negative.   Gastrointestinal: Negative.   Musculoskeletal:        Right hip pain  Skin: Negative.   Neurological: Negative.   Psychiatric/Behavioral: Negative.     Blood pressure 136/76, pulse 69, temperature 98.7 F (37.1 C), temperature source Oral, resp. rate (!) 23, height 5\' 10"  (1.778 m), weight 84.2 kg, SpO2 95 %. Physical Exam HENT:     Head: Normocephalic.     Mouth/Throat:     Mouth: Mucous membranes are moist.  Eyes:     Extraocular Movements: Extraocular movements intact.  Cardiovascular:     Rate and Rhythm: Normal rate.  Abdominal:     General: Abdomen is flat.  Musculoskeletal:     Cervical back: Neck supple.     Comments: Swelling and tenderness to palpation at the right hip.  Some shortening noted.  No tenderness distally about the thigh or knee.  No tenderness about the leg or ankle.  Sensation grossly intact to light touch.  Foot is warm and well-perfused.  No evidence of bilateral upper or left lower extremity trauma.  Skin:    General: Skin is warm.  Neurological:     General: No focal deficit present.     Mental Status: He is alert.  Psychiatric:        Mood and Affect: Mood normal.     Assessment/Plan: We will plan for intramedullary nail of his fracture.  His fracture pattern is amenable to this fixation.  We  had a lengthy discussion with the patient regarding this.  All his questions were answered.  The risk discussed included but were not limited to wound healing complications, infection, malunion, nonunion, need for further surgery as well as damage to surrounding structures and anesthetic risk was also briefly discussed.  Patient would like to proceed with surgery.  12/29/2021, 9:48 AM

## 2021-12-29 NOTE — ED Notes (Signed)
RN reached out to Dr. For pain medicine

## 2021-12-29 NOTE — Op Note (Signed)
Gregory [name] male 81 y.o. 12/29/2021  PreOperative Diagnosis: Right intertochanteric hip fracture  PostOperative Diagnosis: same  PROCEDURE: Cephalomedullary nail of right hip fracture  SURGEON: Dub Mikes, MD  ASSISTANT: Jesse [place], PA-C was necessary for patient positioning, prep, drape, assistance with hardware placement and closure  ANESTHESIA: General endotracheal tube anesthesia  FINDINGS: Displaced intertrochanteric hip fracture  IMPLANTS: Arthrex hip fracture nail, short 9 mm  INDICATIONS:81 y.o. Gregory [name] fall and sustained [name] intertrochanteric hip fracture.  He was indicated for surgery due to baseline ambulatory status and significant pain due to the fracture.  Discussion was had with the patient about the surgery and they wish to proceed.  Patient understood the risks, benefits and alternatives to surgery which include but are not limited to wound healing complications, infection, nonunion, malunion, need for further surgery as well as damage to surrounding structures. They also understood the potential for continued pain in that there were no guarantees of acceptable outcome After weighing these risks the patient opted to proceed with surgery.  PROCEDURE: Patient was identified in the preoperative holding area and the right hip was marked by myself.  The consent was signed by myself and the patient.  This identified the right lower extremity as the appropriate operative extremity and the surgeries to be performed was operative treatment of the hip fracture. They  were taken to the operating room where general endotracheal anesthesia was induced without difficulty and then was moved supine on [name] Hana table.  The operative leg was placed into the traction boot.  The non-operative leg was well-padded and affixed to the right leg holder with coban.  The arm was crossed over the chest and well padded. 2 g of Ancef was given.  Surgical timeout was performed.     Using fluoroscopy appropriate AP and lateral x-rays of the hip were obtained.  Then using traction through the Hana table was able to reduce the fracture fragments in [name] closed fashion.  Acceptable reduction was confirmed on AP and lateral x-rays.  Then the right hip was prepped and draped in usual sterile fashion.  [name] shower curtain drape was placed.  We began the surgery by placing the guidepin percutaneously at the appropriate starting point at the tip of the greater trochanter.  The appropriate starting point was confirmed on AP and lateral radiograph.  Then [name] 3 cm incision was made about the site of the percutaneous placement of the pin and the opening reamer with [name] soft tissue guide was placed into the wound down to the tip of the greater trochanter and the bone was entered with the opening reamer down to the level of lesser trochanter.  Then the openning reamer and guide pin were removed and [name] nail was placed. Then we chose an short 9 mm mm cephalo-medullary nail.  This was placed without difficulty and appropriate positioning was confirmed on fluoroscopy.  Then using the blade insertion guide [name] second incision was made distally down the thigh to allow for placement of the cephalo-medullary screw.  The guidepin for the blade was placed up the femoral neck in the appropriate position center center in the femoral head.  Then the reamer was used to ream for the blade and the blade was placed without difficulty.  The length of the screw was [name] 105 mm.  Then the set screw was tightened.  Then the distal interlock screw was placed without difficulty.  Final films were taken.  The wounds were irrigated with normal saline  and the incisions were closed in [name] layered fashion using 2-0 monocryl and staples.  Mepilex dressings were used.  Patient was then transferred to the hospital bed and awakened from anesthesia without difficulty.  Counts were correct at the end the case.  There were no complications.  POST  OPERATIVE INSTRUCTIONS: WBAT operative extremity Patient will be discharged on 325 mg aspirin for DVT prophylaxis if not already on DVT prophylaxis at baseline Follow-up in 2 weeks for x-rays of the operative hip and suture removal if appropriate    BLOOD LOSS:  200 mL         DRAINS: none         SPECIMEN: none       COMPLICATIONS:  * No complications entered in OR log *         Disposition: PACU - hemodynamically stable.         Condition: stable

## 2021-12-30 ENCOUNTER — Other Ambulatory Visit (HOSPITAL_COMMUNITY): Payer: Self-pay

## 2021-12-30 DIAGNOSIS — N1832 Chronic kidney disease, stage 3b: Secondary | ICD-10-CM | POA: Diagnosis not present

## 2021-12-30 DIAGNOSIS — S72001A Fracture of unspecified part of neck of right femur, initial encounter for closed fracture: Secondary | ICD-10-CM | POA: Diagnosis not present

## 2021-12-30 DIAGNOSIS — N400 Enlarged prostate without lower urinary tract symptoms: Secondary | ICD-10-CM | POA: Diagnosis not present

## 2021-12-30 DIAGNOSIS — E785 Hyperlipidemia, unspecified: Secondary | ICD-10-CM | POA: Diagnosis not present

## 2021-12-30 LAB — CBC WITH DIFFERENTIAL/PLATELET
Abs Immature Granulocytes: 0.03 10*3/uL (ref 0.00–0.07)
Basophils Absolute: 0 10*3/uL (ref 0.0–0.1)
Basophils Relative: 0 %
Eosinophils Absolute: 0 10*3/uL (ref 0.0–0.5)
Eosinophils Relative: 0 %
HCT: 29.8 % — ABNORMAL LOW (ref 39.0–52.0)
Hemoglobin: 10 g/dL — ABNORMAL LOW (ref 13.0–17.0)
Immature Granulocytes: 0 %
Lymphocytes Relative: 8 %
Lymphs Abs: 0.6 10*3/uL — ABNORMAL LOW (ref 0.7–4.0)
MCH: 33 pg (ref 26.0–34.0)
MCHC: 33.6 g/dL (ref 30.0–36.0)
MCV: 98.3 fL (ref 80.0–100.0)
Monocytes Absolute: 0.5 10*3/uL (ref 0.1–1.0)
Monocytes Relative: 8 %
Neutro Abs: 5.7 10*3/uL (ref 1.7–7.7)
Neutrophils Relative %: 84 %
Platelets: 118 10*3/uL — ABNORMAL LOW (ref 150–400)
RBC: 3.03 MIL/uL — ABNORMAL LOW (ref 4.22–5.81)
RDW: 13.6 % (ref 11.5–15.5)
WBC: 6.8 10*3/uL (ref 4.0–10.5)
nRBC: 0 % (ref 0.0–0.2)

## 2021-12-30 LAB — COMPREHENSIVE METABOLIC PANEL
ALT: 15 U/L (ref 0–44)
AST: 12 U/L — ABNORMAL LOW (ref 15–41)
Albumin: 2.8 g/dL — ABNORMAL LOW (ref 3.5–5.0)
Alkaline Phosphatase: 36 U/L — ABNORMAL LOW (ref 38–126)
Anion gap: 5 (ref 5–15)
BUN: 32 mg/dL — ABNORMAL HIGH (ref 8–23)
CO2: 22 mmol/L (ref 22–32)
Calcium: 8.2 mg/dL — ABNORMAL LOW (ref 8.9–10.3)
Chloride: 110 mmol/L (ref 98–111)
Creatinine, Ser: 2.2 mg/dL — ABNORMAL HIGH (ref 0.61–1.24)
GFR, Estimated: 30 mL/min — ABNORMAL LOW (ref >60)
Glucose, Bld: 177 mg/dL — ABNORMAL HIGH (ref 70–99)
Potassium: 4.7 mmol/L (ref 3.5–5.1)
Sodium: 137 mmol/L (ref 135–145)
Total Bilirubin: 0.6 mg/dL (ref 0.3–1.2)
Total Protein: 5.1 g/dL — ABNORMAL LOW (ref 6.5–8.1)

## 2021-12-30 LAB — MAGNESIUM: Magnesium: 2.1 mg/dL (ref 1.7–2.4)

## 2021-12-30 LAB — PHOSPHORUS: Phosphorus: 2.6 mg/dL (ref 2.5–4.6)

## 2021-12-30 MED ORDER — APIXABAN 2.5 MG PO TABS
2.5000 mg | ORAL_TABLET | Freq: Two times a day (BID) | ORAL | Status: DC
  Administered 2021-12-30: 2.5 mg via ORAL
  Filled 2021-12-30: qty 1

## 2021-12-30 MED ORDER — OXYCODONE HCL 5 MG PO TABS
5.0000 mg | ORAL_TABLET | ORAL | 0 refills | Status: AC | PRN
  Filled 2021-12-30: qty 30, 5d supply, fill #0

## 2021-12-30 MED ORDER — DOCUSATE SODIUM 100 MG PO CAPS
100.0000 mg | ORAL_CAPSULE | Freq: Two times a day (BID) | ORAL | 0 refills | Status: DC
  Filled 2021-12-30: qty 10, 5d supply, fill #0

## 2021-12-30 MED ORDER — ONDANSETRON HCL 4 MG PO TABS
4.0000 mg | ORAL_TABLET | Freq: Four times a day (QID) | ORAL | 0 refills | Status: DC | PRN
  Filled 2021-12-30: qty 20, 5d supply, fill #0

## 2021-12-30 MED ORDER — POLYETHYLENE GLYCOL 3350 17 GM/SCOOP PO POWD
17.0000 g | Freq: Every day | ORAL | 0 refills | Status: DC | PRN
  Filled 2021-12-30: qty 238, 14d supply, fill #0

## 2021-12-30 MED ORDER — SODIUM CHLORIDE 0.9 % IV SOLN: INTRAVENOUS | Status: DC

## 2021-12-30 MED ORDER — APIXABAN 2.5 MG PO TABS
2.5000 mg | ORAL_TABLET | Freq: Two times a day (BID) | ORAL | 0 refills | Status: DC
  Filled 2021-12-30: qty 60, 30d supply, fill #0

## 2021-12-30 NOTE — Discharge Summary (Signed)
Physician Discharge Summary   Patient: Gregory [name] MRN: [id number] DOB: [birth year]  Admit date:     12/28/2021  Discharge date: 12/30/21  Discharge Physician: Raiford Noble, DO   PCP: [name]   Recommendations at discharge:   Follow up with PCP within 1 to 2 weeks and repeat CBC, CMP, mag, Phos within 1 week Follow-up with Orthopedic Surgery Dr. Lucia Gaskins within 1 to 2 weeks Follow-up with cardiology Dr. Nicolette Bang within 1 week  Discharge Diagnoses: Principal Problem:   Closed right hip fracture [facility]) Active Problems:   Paroxysmal atrial fibrillation (HCC)   Hypertension   Neuropathy of both feet   CKD (chronic kidney disease) stage 3, GFR 30-59 ml/min (HCC)   BPH with elevated PSA   HLD (hyperlipidemia)  Resolved Problems:   * No resolved hospital problems. [facility] Course: HPI per Dr. Neva Seat on 12/28/21  Gregory [name] is a 81 y.o. male with medical history significant of atrial fibrillation, hypertension, neuropathy, CKD 3, BPH, chronic pain presenting after fall at home and subsequent hip pain.  Patient reports that he was walking outside in the afternoon and tripped on a garden hose.  He landed on the driveway pavement on his right side, did not hit his head.  He had immediate right hip pain and has been unable to bear weight since.  Has some abrasion on his arms but states this is from gardening yesterday.  He denies fevers, chills, chest pain, shortness of breath, abdominal pain, constipation, diarrhea, nausea, vomiting.   ED Course: Vital signs in the ED significant for blood pressure in the [id number] systolic.  Lab work-up including CMP and CBC is still pending.  Chest x-ray showed no acute abnormality.  Right hip x-ray showed acute mildly displaced basicervical fracture of the right hip.  Femur x-ray pending.  Orthopedics has been consulted and will see the patient in the morning with plan for surgery tomorrow.  Patient to be  n.p.o. and hold Eliquis.  Surgical team has ordered nerve block for pain control.  **Interim History    Assessment and Plan:  Closed right hip fracture -Patient presenting after a fall onto his driveway after tripping over a hose.  Had immediate right hip pain and difficulty bear weight. -Confirmed right hip fracture in the ED.  Orthopedics has been consulted have seen the patient as patient has undergone cephalomedullary nail of the right hip fracture -Orthopedic surgery evaluation recommendations appreciated and recommending weightbearing as tolerated on the operative extremity; they are recommending following up in 2 weeks of the operative hip and suture removal -Continue to monitor on telemetry -Nerve block ordered by orthopedics already -Continue with acetaminophen 1000 mg once and 5 mg p.o. every 6 scheduled for 4 doses and then started on 325-650 mg every 6 hours as needed mild pain -Consider further pain control with as needed Dilaudid if any issues with nerve block; currently on hydromorphone 1 mg every 4 as needed severe pain and also has oxycodone 5 mg p.o. every 6 as needed for moderate pain -Hold home Eliquis and resume when okay with orthopedic surgery -Bowel regimen initiated with docusate 100 mils p.o. twice daily and MiraLAX 17 g p.o. daily as needed for mild constipation -Check PT/INR, type and screen in the morning; PT/INR was 15.5-1.2 respectively   Paroxysmal atrial fibrillation and is currently in normal sinus rhythm -Continue home Amiodarone 200 mg 2 daily -Hold home Eliquis as above and resume when okay with orthopedic surgery  Hypertension -Continue home Lisinopril 5 mg p.o. daily -Continue to monitor blood pressures per protocol -Last blood pressure reading was 111/72  CKD 3b -Patient's BUNs/creatinine went from 30/1.98 is now 29/1.89 -Avoid nephrotoxic medications, contrast dyes, hypotension and dehydration to ensure adequate renal perfusion and renally  adjust medications -Repeat CMP in a.m.  BPH -Continue home Tamsulosin 0.4 mg p.o. daily  Hyperlipidemia -Continue home Rosuvastatin 5 mg p.o. daily   Macrocytosis without anemia -Expect some postoperative drop in hemoglobin given his surgical intervention -Continue monitor trend and repeat CBC in a.m.   Hyperglycemia -Patient blood sugars ranging from 138-158 on daily BMP/CMP -Continue monitor and trend and if necessary will place on sensitive NovoLog sign scale insulin   Consultants: *** Procedures performed: ***  Disposition: {Plan; Disposition:26390} Diet recommendation:  Discharge Diet Orders (From admission, onward)     Start     Ordered   12/30/21 0000  Diet - low sodium heart healthy        12/30/21 1156           Cardiac diet DISCHARGE MEDICATION: Allergies as of 12/30/2021   No Known Allergies      Medication List     TAKE these medications    acetaminophen 500 MG tablet Commonly known as: TYLENOL Take 1,000 mg by mouth every 6 (six) hours as needed for moderate pain or headache.   amiodarone 200 MG tablet Commonly known as: PACERONE Take 1 tablet (200 mg total) by mouth daily. Take twice daily for 10 days then one tab daily. What changed: additional instructions   docusate sodium 100 MG capsule Commonly known as: COLACE Take 1 capsule (100 mg total) by mouth 2 (two) times daily.   Eliquis 2.5 MG Tabs tablet Generic drug: apixaban Take 1 tablet (2.5 mg total) by mouth 2 (two) times daily. What changed:  medication strength See the new instructions.   fluticasone 50 MCG/ACT nasal spray Commonly known as: FLONASE Place 2 sprays into both nostrils daily as needed for allergies.   lisinopril 5 MG tablet Commonly known as: ZESTRIL TAKE 1 TABLET(5 MG) BY MOUTH DAILY What changed: See the new instructions.   Multivitamin Adults 50+ Tabs Take 1 tablet by mouth 2 (two) times a week.   ondansetron 4 MG tablet Commonly known as: ZOFRAN Take 1  tablet (4 mg total) by mouth every 6 (six) hours as needed for nausea.   oxyCODONE 5 MG immediate release tablet Commonly known as: Roxicodone Take 1 tablet (5 mg total) by mouth every 4 (four) hours as needed for up to 5 days for severe pain.   polyethylene glycol powder 17 GM/SCOOP powder Commonly known as: GLYCOLAX/MIRALAX Take 17 g by mouth daily as needed for mild constipation.   psyllium 58.6 % packet Commonly known as: METAMUCIL Take 1 packet by mouth daily.   rosuvastatin 5 MG tablet Commonly known as: CRESTOR TAKE 1 TABLET(5 MG) BY MOUTH DAILY What changed: See the new instructions.   tamsulosin 0.4 MG Caps capsule Commonly known as: FLOMAX Take 0.8 mg by mouth every evening.               Discharge Care Instructions  (From admission, onward)           Start     Ordered   12/30/21 0000  Discharge wound care:       Comments: Reinforce Dressing   12/30/21 1156            Follow-up Information  [name] Follow up.   Specialty: Family Medicine Why: Follow up within 1-2 weeks Contact information: [address] [place] [id number] [phone]         Constance Haw, MD .   Specialty: Cardiology Contact information: [address] [place] [address] [phone]         Adrian Prows, MD Follow up.   Specialty: Cardiology Why: Follow up with Dr. Einar Gip within 1-2 weeks Contact information: [place] [address] [phone]         Erle Crocker, MD Follow up.   Specialty: Orthopedic Surgery Why: Follow up within 1-2 weeks Contact information: [address] [place]  [address] [phone]                Discharge Exam: Filed Weights   12/28/21 1815  Weight: 84.2 kg   Vitals:   12/30/21 0440 12/30/21 0801  BP: 109/61 110/63  Pulse: 71 77  Resp:  (!) 21  Temp: 98.2 F (36.8 C) 98.3 F (36.8 C)  SpO2: 93% 95%    Examination: Physical Exam:  Constitutional: WN/WD, NAD and appears calm and comfortable Eyes: PERRL, lids and conjunctivae normal, sclerae anicteric  ENMT: External Ears, Nose appear normal. Grossly normal hearing. Mucous membranes are moist. Posterior pharynx clear of any exudate or lesions. Normal dentition.  Neck: Appears normal, supple, no cervical masses, normal ROM, no appreciable thyromegaly Respiratory: Clear to auscultation bilaterally, no wheezing, rales, rhonchi or crackles. Normal respiratory effort and patient is not tachypenic. No accessory muscle use.  Cardiovascular: RRR, no murmurs / rubs / gallops. S1 and S2 auscultated. No extremity edema. 2+ pedal pulses. No carotid bruits.  Abdomen: Soft, non-tender, non-distended. No masses palpated. No appreciable hepatosplenomegaly. Bowel sounds positive.  GU: Deferred. Musculoskeletal: No clubbing / cyanosis of digits/nails. No joint deformity upper and lower extremities. Good ROM, no contractures. Normal strength and muscle tone.  Skin: No rashes, lesions, ulcers. No induration; Warm and dry.  Neurologic: CN 2-12 grossly intact with no focal deficits. Sensation intact in all 4 Extremities, DTR normal. Strength 5/5 in all 4. Romberg sign cerebellar reflexes not assessed.  Psychiatric: Normal judgment and insight. Alert and oriented x 3. Normal mood and appropriate affect.   Condition at discharge: stable  The results of significant diagnostics from this hospitalization (including imaging, microbiology, ancillary and laboratory) are listed below for reference.   Imaging Studies: DG FEMUR, MIN 2 VIEWS RIGHT  Result Date: 12/29/2021 CLINICAL DATA:  Right hip fracture. EXAM: RIGHT FEMUR 2 VIEWS COMPARISON:  None Available. FINDINGS: Multiple fluoroscopic spot images show an intramedullary nail transfixing a basicervical right femoral neck fracture in near anatomic alignment. IMPRESSION: Internal fixation of right femoral neck fracture  in near anatomic alignment. Electronically Signed   By: Marlaine Hind M.D.   On: 12/29/2021 12:55   DG C-Arm 1-60 Min-No Report  Result Date: 12/29/2021 Fluoroscopy was utilized by the requesting physician.  No radiographic interpretation.   DG FEMUR, MIN 2 VIEWS RIGHT  Result Date: 12/28/2021 CLINICAL DATA:  Tripped and fell EXAM: RIGHT FEMUR 2 VIEWS COMPARISON:  12/28/2021 FINDINGS: Frontal and frogleg lateral views of the right femur are obtained. Minimally displaced basicervical right femoral neck fracture is again identified. No dislocation. The remainder of the right femur is unremarkable. Medial compartmental right knee arthroplasty is identified. Soft tissues are unremarkable. IMPRESSION: 1. Acute right femoral neck fracture as above. 2. Unremarkable partial right  knee arthroplasty. Electronically Signed   By: Randa Ngo M.D.   On: 12/28/2021 21:27   DG Hip [place] W or [place] Pelvis 1 View Right  Result Date: 12/28/2021 CLINICAL DATA:  Fall, right hip pain EXAM: DG HIP (WITH OR WITHOUT PELVIS) 1V PORT RIGHT COMPARISON:  None Available. FINDINGS: There is an acute, mildly displaced basicervical fracture of the right hip. Right femoral head is still seated within the right acetabulum and right hip joint space is preserved. Mild left hip degenerative arthritis noted. Pelvis and left hip are intact. Soft tissues are unremarkable. IMPRESSION: Acute, mildly displaced basicervical fracture of the right hip. No dislocation. Electronically Signed   By: Fidela Salisbury M.D.   On: 12/28/2021 20:27   DG Chest Portable 1 View  Result Date: 12/28/2021 CLINICAL DATA:  [place], hip fracture EXAM: PORTABLE CHEST 1 VIEW COMPARISON:  03/31/2012 FINDINGS: Two frontal views of the chest demonstrate a stable cardiac silhouette. No acute airspace disease, effusion, or pneumothorax. No acute bony abnormalities. IMPRESSION: 1. No acute intrathoracic process. Electronically Signed   By: Randa Ngo M.D.   On:  12/28/2021 20:05   CT CHEST ABDOMEN PELVIS WO CONTRAST  Result Date: 12/14/2021 CLINICAL DATA:  Shortness of breath. Generalized abdominal pain with change in bowel habits for several months. EXAM: CT CHEST, ABDOMEN AND PELVIS WITHOUT CONTRAST TECHNIQUE: Multidetector CT imaging of the chest, abdomen and pelvis was performed following the standard protocol without IV contrast. RADIATION DOSE REDUCTION: This exam was performed according to the departmental dose-optimization program which includes automated exposure control, adjustment of the mA and/or kV according to patient size and/or use of iterative reconstruction technique. COMPARISON:  None Available. FINDINGS: CT CHEST FINDINGS Cardiovascular: No acute findings. Aortic and coronary atherosclerotic calcification incidentally noted. Mediastinum/Lymph Nodes: No masses or pathologically enlarged lymph nodes identified on this unenhanced exam. Lungs/Pleura: Several scattered tiny pulmonary nodules are seen bilaterally measuring up to 5 mm. No evidence of pulmonary infiltrate or pleural effusion. Musculoskeletal:  No suspicious bone lesions identified. CT ABDOMEN AND PELVIS FINDINGS Hepatobiliary: No masses visualized on this unenhanced exam. Gallbladder is unremarkable. No evidence of biliary ductal dilatation. Pancreas: No mass or inflammatory changes identified on this unenhanced exam. Spleen:  Within normal limits in size. Adrenals/Urinary Tract: No evidence of urolithiasis or hydronephrosis. Several fluid attenuation right renal sinus cysts are noted (no followup imaging recommended) . Unremarkable appearance of bladder. Stomach/Bowel: No evidence of obstruction, inflammatory process, or abnormal fluid collections. Normal appendix visualized. Vascular/Lymphatic: No pathologically enlarged lymph nodes identified. No abdominal aortic aneurysm.Aortic atherosclerotic calcification incidentally noted. Reproductive:  Moderately enlarged prostate noted. Other:   None. Musculoskeletal:  No suspicious bone lesions identified. IMPRESSION: No acute findings within the chest, abdomen, or pelvis. Moderately enlarged prostate. Scattered tiny indeterminate bilateral pulmonary nodules measuring up to 5 mm. No follow-up needed if patient is low-risk (and has no known or suspected primary neoplasm). Non-contrast chest CT can be considered in 12 months if patient is high-risk. This recommendation follows the consensus statement: Guidelines for Management of Incidental Pulmonary Nodules Detected on CT Images: From the Fleischner Society 2017; Radiology 2017; 284:228-243. Electronically Signed   By: Marlaine Hind M.D.   On: 12/14/2021 12:29    Microbiology: Results for orders placed or performed during the hospital encounter of 12/28/21  Surgical pcr screen     Status: None   Collection Time: 12/29/21  9:30 AM   Specimen: Nasal Mucosa; Nasal Swab  Result Value Ref Range Status   MRSA,  PCR NEGATIVE NEGATIVE Final   Staphylococcus aureus NEGATIVE NEGATIVE Final    Comment: (NOTE) The Xpert SA Assay (FDA approved for NASAL specimens in patients 2 years of age and older), is one component of a comprehensive surveillance program. It is not intended to diagnose infection nor to guide or monitor treatment. Performed at [facility], [address], [place], [place] [address]    Labs: CBC: Recent Labs  Lab 12/28/21 2135 12/29/21 0341 12/30/21 0149  WBC 8.4 6.2 6.8  NEUTROABS 6.7  --  5.7  HGB 13.1 13.0 10.0*  HCT 40.4 40.3 29.8*  MCV 101.8* 102.3* 98.3  PLT 159 152 118*   Basic Metabolic Panel: Recent Labs  Lab 12/28/21 2135 12/29/21 0341 12/30/21 0149  NA 141 140 137  K 3.8 4.5 4.7  CL 110 106 110  CO2 22 23 22   GLUCOSE 158* 138* 177*  BUN 30* 29* 32*  CREATININE 1.98* 1.89* 2.20*  CALCIUM 8.9 8.8* 8.2*  MG  --   --  2.1  PHOS  --   --  2.6   Liver Function Tests: Recent Labs  Lab 12/28/21 2135 12/30/21 0149  AST 19 12*  ALT 20 15   ALKPHOS 49 36*  BILITOT 0.7 0.6  PROT 6.2* 5.1*  ALBUMIN 3.7 2.8*   CBG: No results for input(s): "GLUCAP" in the last 168 hours.  Discharge time spent: greater than 30 minutes.  Signed: 01/01/22, DO [facility] 12/30/2021

## 2021-12-30 NOTE — Discharge Instructions (Addendum)
Information on my medicine - ELIQUIS (apixaban)  This medication education was reviewed with me or my healthcare representative as part of my discharge preparation.  You were taking this medication prior to this hospital admission.   Your Eliquis dose has been reduced to 2.5 mg twice daily.  Why was Eliquis prescribed for you? Eliquis was prescribed for you to reduce the risk of a blood clot forming that can cause a stroke if you have a medical condition called atrial fibrillation (a type of irregular heartbeat).  What do You need to know about Eliquis ?   Your Eliquis dose has been reduced to 2.5 mg twice daily.  Take your Eliquis TWICE DAILY -  2.5 mg  in the morning and 2.5 mg in the evening with or without food. If you have difficulty swallowing the tablet whole please discuss with your pharmacist how to take the medication safely.  Take Eliquis exactly as prescribed by your doctor and DO NOT stop taking Eliquis without talking to the doctor who prescribed the medication.  Stopping may increase your risk of developing a stroke.  Refill your prescription before you run out.  After discharge, you should have regular check-up appointments with your healthcare provider that is prescribing your Eliquis.  In the future your dose may need to be changed if your kidney function or weight changes by a significant amount or as you get older.  What do you do if you miss a dose? If you miss a dose, take it as soon as you remember on the same day and resume taking twice daily.  Do not take more than one dose of ELIQUIS at the same time to make up a missed dose.  Important Safety Information A possible side effect of Eliquis is bleeding. You should call your healthcare provider right away if you experience any of the following: Bleeding from an injury or your nose that does not stop. Unusual colored urine (red or dark brown) or unusual colored stools (red or black). Unusual bruising for unknown  reasons. A serious fall or if you hit your head (even if there is no bleeding).  Some medicines may interact with Eliquis and might increase your risk of bleeding or clotting while on Eliquis. To help avoid this, consult your healthcare provider or pharmacist prior to using any new prescription or non-prescription medications, including herbals, vitamins, non-steroidal anti-inflammatory drugs (NSAIDs) and supplements.  This website has more information on Eliquis (apixaban): http://www.eliquis.com/eliquis/home

## 2021-12-30 NOTE — Evaluation (Signed)
Occupational Therapy Evaluation Patient Details Name: Gregory [name] MRN: [id number] DOB: Nov 12, 1940 Today's Date: 12/30/2021   History of Present Illness Pt is an 81 y/o male who presented after fall outside of the home resulting in R hip fx. Pt underwent cephalomedullary nailing of R hip fx on 7/16. PMH: gout, atrial flutter, diverticulosis of colon, HLD, PNA.   Clinical Impression   PTA, pt lives with spouse, typically Modified Independent with ADLs/mobility using RW "on and off again" for past year. Pt presents now with pain well controlled in R hip, able to mobilize to/from bathroom using RW with min guard at most. Pt is Modified Independent for UB ADLs and requires up to Min A for LB ADLs d/t difficulty reaching B feet with increased pain. Educated re: toileting tasks with RW, strategies for LB ADLs, encouraged continued flexibility/stretching activities to improve reach for LB ADLs, and use of DME at home. Anticipate no OT needs at DC with wife to assist as needed. Pt functionally appropriate to DC home today if medically cleared.     Recommendations for follow up therapy are one component of a multi-disciplinary discharge planning process, led by the attending physician.  Recommendations may be updated based on patient status, additional functional criteria and insurance authorization.   Follow Up Recommendations  No OT follow up    Assistance Recommended at Discharge Intermittent Supervision/Assistance  Patient can return home with the following A little help with bathing/dressing/bathroom;Assistance with cooking/housework;Help with stairs or ramp for entrance;Assist for transportation    Functional Status Assessment  Patient has had a recent decline in their functional status and demonstrates the ability to make significant improvements in function in a reasonable and predictable amount of time.  Equipment Recommendations  None recommended by OT    Recommendations for Other  Services       Precautions / Restrictions Precautions Precautions: Fall Restrictions Weight Bearing Restrictions: Yes RLE Weight Bearing: Weight bearing as tolerated      Mobility Bed Mobility Overal bed mobility: Needs Assistance Bed Mobility: Supine to Sit     Supine to sit: Min guard     General bed mobility comments: for safety and guiding of hips to EOB from flat bed    Transfers Overall transfer level: Needs assistance Equipment used: Rolling walker (2 wheels) Transfers: Sit to/from Stand Sit to Stand: Min guard                  Balance Overall balance assessment: Needs assistance Sitting-balance support: No upper extremity supported, Feet supported Sitting balance-Leahy Scale: Good     Standing balance support: Bilateral upper extremity supported, During functional activity Standing balance-Leahy Scale: Poor                             ADL either performed or assessed with clinical judgement   ADL Overall ADL's : Needs assistance/impaired Eating/Feeding: Independent   Grooming: Supervision/safety;Standing   Upper Body Bathing: Modified independent;Sitting   Lower Body Bathing: Minimal assistance;Sit to/from stand   Upper Body Dressing : Modified independent;Sitting   Lower Body Dressing: Minimal assistance;Sit to/from stand Lower Body Dressing Details (indicate cue type and reason): able to reach to ankles for sock adjustment but increased pain with hip flexion. educated on easier clothing to mgmt, assist from wife and/or eliminating sock wear, etc. Encouraged continued flexibility and reaching down to feet during healing proces Toilet Transfer: Min guard;Ambulation;Rolling walker (2 wheels)   Toileting- Clothing Manipulation  and Hygiene: Supervision/safety;Sit to/from stand Toileting - Clothing Manipulation Details (indicate cue type and reason): educated on pushing RW over toilet to urinate while standing if this is preferred  method. pt able to manage clothing but did not have to urinate     Functional mobility during ADLs: Min guard;Rolling walker (2 wheels)       Vision Baseline Vision/History: 1 Wears glasses Ability to See in Adequate Light: 0 Adequate Patient Visual Report: No change from baseline Vision Assessment?: No apparent visual deficits     Perception     Praxis      Pertinent Vitals/Pain Pain Assessment Pain Assessment: 0-10 Pain Score: 4  Pain Location: R hip when movement/weightbearing Pain Descriptors / Indicators: Grimacing, Sore Pain Intervention(s): Monitored during session, Premedicated before session     Hand Dominance Right   Extremity/Trunk Assessment Upper Extremity Assessment Upper Extremity Assessment: Overall WFL for tasks assessed   Lower Extremity Assessment Lower Extremity Assessment: Defer to PT evaluation   Cervical / Trunk Assessment Cervical / Trunk Assessment: Normal   Communication Communication Communication: No difficulties   Cognition Arousal/Alertness: Awake/alert Behavior During Therapy: WFL for tasks assessed/performed Overall Cognitive Status: Within Functional Limits for tasks assessed                                       General Comments  Wife present, attempting to prep pt for shaving task - had asked about therapy seeing pt yesterday as MD stated therapy would see pt yesterday and was upset that therapy had not seen pt yesterday. educated on when orders had been placed in system (afternoon on sunday), typical schedules for therapies completed prior to 8AM    Exercises     Shoulder Instructions      Home Living Family/patient expects to be discharged to:: Private residence Living Arrangements: Spouse/significant other Available Help at Discharge: Family;Available 24 hours/day Type of Home: House Home Access: Stairs to enter [employer] of Steps: 3 Entrance Stairs-Rails: Left Home Layout: One level      Bathroom Shower/Tub: Producer, television/film/video: Handicapped height     Home Equipment: Shower seat - built in;Grab bars - tub/shower;Toilet riser;Rolling Environmental consultant (2 wheels)   Additional Comments: have access to other walkers/crutches from friends/family      Prior Functioning/Environment Prior Level of Function : Independent/Modified Independent;Driving             Mobility Comments: use of RW on and off per wife for past year. denies any recent falls other than one leading to this admission ADLs Comments: able to complete ADLs, wife does most IADLs. still drives        OT Problem List: Decreased activity tolerance;Impaired balance (sitting and/or standing);Pain      OT Treatment/Interventions: Self-care/ADL training;Therapeutic exercise;DME and/or AE instruction;Therapeutic activities;Patient/family education    OT Goals(Current goals can be found in the care plan section) Acute Rehab OT Goals Patient Stated Goal: home today, get some sleep OT Goal Formulation: With patient/family Time For Goal Achievement: 01/13/22 Potential to Achieve Goals: Good  OT Frequency: Min 2X/week    Co-evaluation              AM-PAC OT "6 Clicks" Daily Activity     Outcome Measure Help from another person eating meals?: None Help from another person taking care of personal grooming?: A Little Help from another person toileting, which includes using toliet,  bedpan, or urinal?: A Little Help from another person bathing (including washing, rinsing, drying)?: A Little Help from another person to put on and taking off regular upper body clothing?: None Help from another person to put on and taking off regular lower body clothing?: A Little 6 Click Score: 20   End of Session Equipment Utilized During Treatment: Gait belt;Rolling walker (2 wheels) Nurse Communication: Mobility status  Activity Tolerance: Patient tolerated treatment well Patient left:  (to ambulate with PT)  OT  Visit Diagnosis: Other abnormalities of gait and mobility (R26.89)                Time: 1121-6244 OT Time Calculation (min): 23 min Charges:  OT General Charges $OT Visit: 1 Visit OT Evaluation $OT Eval Moderate Complexity: 1 Mod  Bradd Canary, OTR/L Acute Rehab Services Office: [phone]   Lorre Munroe 12/30/2021, 9:00 AM

## 2021-12-30 NOTE — Progress Notes (Addendum)
ANTICOAGULATION CONSULT NOTE - Initial Consult  Pharmacy Consult for Eliquis Indication: atrial fibrillation history, on eliquis PTA  No Known Allergies  Patient Measurements: Height: 5\' 10"  (177.8 cm) Weight: 84.2 kg (185 lb 10 oz) IBW/kg (Calculated) : 73   Vital Signs: Temp: 98.3 F (36.8 C) (07/17 0801) Temp Source: Oral (07/17 0801) BP: 110/63 (07/17 0801) Pulse Rate: 77 (07/17 0801)  Labs: Recent Labs    12/28/21 2135 12/29/21 0341 12/30/21 0149  HGB 13.1 13.0 10.0*  HCT 40.4 40.3 29.8*  PLT 159 152 118*  LABPROT  --  15.5*  --   INR  --  1.2  --   CREATININE 1.98* 1.89* 2.20*    Estimated Creatinine Clearance: 27.7 mL/min (A) (by C-G formula based on SCr of 2.2 mg/dL (H)).   Medical History: 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 01/12/2020   Arthritis    "right knee" (01/28/2016)   Atypical atrial flutter (HCC) 05/17/2019   Diverticulosis of colon (without mention of hemorrhage)    Dysrhythmia    Hyperlipidemia    Internal hemorrhoids without mention of complication    Personal history of colonic polyps 01/17/2002   adenomatous, tublar adenoma 02/10/2007   PNA (pneumonia) 03/31/2012    Medications:  Medications Prior to Admission  Medication Sig Dispense Refill Last Dose   acetaminophen (TYLENOL) 500 MG tablet Take 1,000 mg by mouth every 6 (six) hours as needed for moderate pain or headache.   12/28/2021   amiodarone (PACERONE) 200 MG tablet Take 1 tablet (200 mg total) by mouth daily. Take twice daily for 10 days then one tab daily. (Patient taking differently: Take 200 mg by mouth daily.) 90 tablet 3 12/28/2021   ELIQUIS 5 MG TABS tablet TAKE 1 TABLET(5 MG) BY MOUTH TWICE DAILY (Patient taking differently: Take 5 mg by mouth 2 (two) times daily.) 180 tablet 3 12/28/2021 at 0800   fluticasone (FLONASE) 50 MCG/ACT nasal spray Place 2 sprays into both nostrils daily as needed for allergies.   unknown    lisinopril (ZESTRIL) 5 MG tablet TAKE 1 TABLET(5 MG) BY MOUTH DAILY (Patient taking differently: Take 5 mg by mouth daily.) 90 tablet 1 12/28/2021   Multiple Vitamins-Minerals (MULTIVITAMIN ADULTS 50+) TABS Take 1 tablet by mouth 2 (two) times a week.   Past Week   psyllium (METAMUCIL) 58.6 % packet Take 1 packet by mouth daily.   12/28/2021   rosuvastatin (CRESTOR) 5 MG tablet TAKE 1 TABLET(5 MG) BY MOUTH DAILY (Patient taking differently: Take 5 mg by mouth every evening.) 90 tablet 3 12/27/2021   tamsulosin (FLOMAX) 0.4 MG CAPS capsule Take 0.8 mg by mouth every evening.   12/27/2021    Assessment: 81 y.o male with right hip fracture.  S/p nail fixation of right hip fracture 7/16.   He takes Eliquis prior to admission for h/o atrial fibrillation.  Pharmacy consulted to resume Eliquis on POD #1 if  Hgb stable (>9) and not requiring blood transfusion.    POD #1 , Hgb is 10 from 13. Hct 29.8 from 40.3,  No bleeding noted. No orders for transfusion noted.  Per Ortho's note it is okay to resume elqiuis today and also suitable for discharge from orthopedic standpoint once cleared by physical therapy and the primary team.  NOTE: His Eliquis dose was 5 mg bid PTA.  Due to his age 81yo, Scr 1.89 >2.2, h/o CKD 3 (SCr  has been consistently > 1.6  for ~  4 months),  thus will need decrease Eliquis to 2.5mg  BID.    Goal of Therapy:  Monitor platelets by anticoagulation protocol: Yes   Plan:  Resume Eliquis at reduced dose 2.5 mg BID.  Recommend to reduce his discharge dose of Eliquis to 2.5 mg BID.    Thank you for allowing pharmacy to be part of this patients care team.  Noah Delaine, RPh Clinical Pharmacist  [phone] 12/30/2021,9:39 AM Please check AMION for all [facility] phone numbers After 10:00 PM, call Main Pharmacy [phone]

## 2021-12-30 NOTE — Progress Notes (Signed)
     Gregory [name] is a 81 y.o. male   Orthopaedic diagnosis: Right intertrochanteric hip fracture  Surgery: Nail fixation of right intertrochanteric hip fracture 12/29/2021  Subjective: Patient appears comfortable in bed.  Pain well controlled on current regimen.  He has not yet worked with physical therapy yet and is looking forward to this today.  His wife is present in the room.  No overnight concerns.  Baseline Eliquis continues to be held.  He is hoping to be discharged home.  He has underwent several other orthopedic surgeries to the right knee and right ankle in the past and has done well with these recovering at home.  Objectyive: Vitals:   12/30/21 0440 12/30/21 0801  BP: 109/61 110/63  Pulse: 71 77  Resp:  (!) 21  Temp: 98.2 F (36.8 C) 98.3 F (36.8 C)  SpO2: 93% 95%     Exam: Awake and alert Respirations even and unlabored No acute distress  Examination of the right lower extremity demonstrates clean, dry, and intact surgical dressing.  Minimal swelling about the hip.  No gross deformities or ecchymosis.  He tolerates gentle flexion and extension at the knee.  He has 5 out of 5 strength at the ankles.  He has intact sensation to light touch throughout.  He has a palpable pedal pulse.  The toes are warm and well-perfused distally.  Assessment: Postop day 1 status post above, doing well   Plan: Patient is suitable for discharge from orthopedic standpoint once cleared by physical therapy and the primary team.   -May resume baseline Eliquis today from orthopedic standpoint.  This will also serve for DVT prophylaxis. -Weightbearing as tolerated right lower extremity with walker. -Up with PT/OT today. -Pain control as needed.  Plan for follow-up outpatient with Dr. Susa Simmonds at [facility] orthopedics 2 weeks from surgery for x-rays and staple removal if appropriate.   Shawneen Deetz J. [place], PA-C

## 2021-12-30 NOTE — Evaluation (Signed)
Physical Therapy Evaluation and Discharge Patient Details Name: Gregory [name] MRN: [id number] DOB: [birth year] Today's Date: 12/30/2021  History of Present Illness  Pt is an 81 y/o male who presented after fall outside of the home resulting in R hip fx. Pt underwent cephalomedullary nailing of R hip fx on 7/16. PMH: gout, atrial flutter, diverticulosis of colon, HLD, PNA.  Clinical Impression   Patient evaluated by Physical Therapy with no further acute PT needs identified. All education has been completed and the patient has no further questions. Managing well with bed mobility, transfers, and stairs; Discussed car transfers; Provided pt with Hip Exercises to begin at home; Questions solicited and answered;  See below for any follow-up Physical Therapy or equipment needs. PT is signing off. Thank you for this referral.  OK for dc home from PT standpoint; Notified Care Team        Recommendations for follow up therapy are one component of a multi-disciplinary discharge planning process, led by the attending physician.  Recommendations may be updated based on patient status, additional functional criteria and insurance authorization.  Follow Up Recommendations Outpatient PT (Pt has a favorite PT clinica nd PT at [place])      Assistance Recommended at Discharge PRN  Patient can return home with the following  Assistance with cooking/housework;A little help with bathing/dressing/bathroom    Equipment Recommendations None recommended by PT (Well-equipped)  Recommendations for Other Services       Functional Status Assessment Patient has had a recent decline in their functional status and demonstrates the ability to make significant improvements in function in a reasonable and predictable amount of time.     Precautions / Restrictions Precautions Precautions: Fall Precaution Comments: Fall risk present, but minimal and decreased further with use of assistive  device Restrictions Weight Bearing Restrictions: Yes RLE Weight Bearing: Weight bearing as tolerated      Mobility  Bed Mobility Overal bed mobility: Needs Assistance Bed Mobility: Supine to Sit     Supine to sit: Min guard     General bed mobility comments: for safety and guiding of hips to EOB from flat bed    Transfers Overall transfer level: Needs assistance Equipment used: Rolling walker (2 wheels) Transfers: Sit to/from Stand Sit to Stand: Min guard           General transfer comment: Cues for hand placement and safety    Ambulation/Gait Ambulation/Gait assistance: Min guard, Supervision Gait Distance (Feet): 150 Feet (80+30) Assistive device: Rolling walker (2 wheels) Gait Pattern/deviations: Antalgic Gait velocity: slowed     General Gait Details: Cues for sequence and to fully activate quads and gluteals in R stance fo rstability and upright posture; step-through pattern emerging  Stairs Stairs: Yes Stairs assistance: Min guard, Supervision Stair Management: One rail Left, With crutches, Forwards, Step to pattern Number of Stairs: 5 General stair comments: cues for sequence; pt is well-practiced in stair negotiation and managed well with cruthc and rail  Wheelchair Mobility    Modified Rankin (Stroke Patients Only)       Balance     Sitting balance-Leahy Scale: Good       Standing balance-Leahy Scale: Poor                               Pertinent Vitals/Pain Pain Assessment Pain Assessment: 0-10 Pain Score: 5  Pain Location: R hip when movement/weightbearing Pain Descriptors / Indicators: Grimacing, Sore Pain  Intervention(s): Monitored during session    Home Living Family/patient expects to be discharged to:: Private residence Living Arrangements: Spouse/significant other Available Help at Discharge: Family;Available 24 hours/day Type of Home: House Home Access: Stairs to enter Entrance Stairs-Rails: Left Entrance  Stairs-Number of Steps: 3   Home Layout: One level Home Equipment: Shower seat - built in;Grab bars - tub/shower;Toilet riser;Rolling Environmental consultant (2 wheels) Additional Comments: have access to other walkers/crutches from friends/family    Prior Function Prior Level of Function : Independent/Modified Independent;Driving             Mobility Comments: use of RW on and off per wife for past year. denies any recent falls other than one leading to this admission ADLs Comments: able to complete ADLs, wife does most IADLs. still drives     Hand Dominance   Dominant Hand: Right    Extremity/Trunk Assessment   Upper Extremity Assessment Upper Extremity Assessment: Defer to OT evaluation    Lower Extremity Assessment Lower Extremity Assessment: RLE deficits/detail RLE Deficits / Details: Able to actively move teh R hip, limited range, but [facility] for simple mobility activity    Cervical / Trunk Assessment Cervical / Trunk Assessment: Normal  Communication   Communication: No difficulties  Cognition Arousal/Alertness: Awake/alert Behavior During Therapy: WFL for tasks assessed/performed Overall Cognitive Status: Within Functional Limits for tasks assessed                                          General Comments General comments (skin integrity, edema, etc.): Wife present and helpful; VSS on RA    Exercises     Assessment/Plan    PT Assessment All further PT needs can be met in the next venue of care  PT Problem List Decreased strength;Decreased range of motion;Decreased activity tolerance;Decreased balance;Decreased mobility;Pain       PT Treatment Interventions      PT Goals (Current goals can be found in the Care Plan section)  Acute Rehab PT Goals Patient Stated Goal: Home today PT Goal Formulation: All assessment and education complete, DC therapy    Frequency       Co-evaluation               AM-PAC PT "6 Clicks" Mobility  Outcome Measure  Help needed turning from your back to your side while in a flat bed without using bedrails?: A Little Help needed moving from lying on your back to sitting on the side of a flat bed without using bedrails?: A Little Help needed moving to and from a bed to a chair (including a wheelchair)?: A Little Help needed standing up from a chair using your arms (e.g., wheelchair or bedside chair)?: A Little Help needed to walk in hospital room?: A Little Help needed climbing 3-5 steps with a railing? : A Little 6 Click Score: 18    End of Session Equipment Utilized During Treatment: Gait belt Activity Tolerance: Patient tolerated treatment well Patient left: in chair;with call bell/phone within reach;with family/visitor present Nurse Communication: Mobility status (and ok for dc home today) PT Visit Diagnosis: Pain;Other abnormalities of gait and mobility (R26.89) Pain - Right/Left: Right Pain - part of body: Hip    Time: 9150-5697 PT Time Calculation (min) (ACUTE ONLY): 27 min   Charges:   PT Evaluation $PT Eval Low Complexity: 1 Low PT Treatments $Gait Training: 8-22 mins  Roney Marion, PT  Acute Rehabilitation Services Office [phone]   Colletta [place] 12/30/2021, 9:27 AM

## 2021-12-30 NOTE — Progress Notes (Signed)
Discharge instructions given to patient and to spouse with medication details. Patient and spouse verbalized understanding. Conversation regarding fact that patient had eliquis 5mg  at home, but it was recommended by pharmacy not to cut pill in half because it isn't scored.  Both, patient and spouse verbalized understanding.

## 2021-12-30 NOTE — Telephone Encounter (Signed)
From patient.

## 2021-12-31 ENCOUNTER — Encounter (HOSPITAL_COMMUNITY): Payer: Self-pay | Admitting: Orthopaedic Surgery

## 2022-01-01 ENCOUNTER — Encounter: Payer: Self-pay | Admitting: Physical Therapy

## 2022-01-01 ENCOUNTER — Ambulatory Visit (INDEPENDENT_AMBULATORY_CARE_PROVIDER_SITE_OTHER): Payer: Medicare Other | Admitting: Physical Therapy

## 2022-01-01 DIAGNOSIS — M25551 Pain in right hip: Secondary | ICD-10-CM

## 2022-01-01 DIAGNOSIS — M25671 Stiffness of right ankle, not elsewhere classified: Secondary | ICD-10-CM

## 2022-01-01 DIAGNOSIS — M79651 Pain in right thigh: Secondary | ICD-10-CM

## 2022-01-01 DIAGNOSIS — M25571 Pain in right ankle and joints of right foot: Secondary | ICD-10-CM

## 2022-01-01 DIAGNOSIS — R2689 Other abnormalities of gait and mobility: Secondary | ICD-10-CM

## 2022-01-01 NOTE — Therapy (Signed)
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 01/12/2020   Arthritis    "right knee" (01/28/2016)   Atypical atrial flutter (HCC) 05/17/2019   Diverticulosis of colon (without mention of hemorrhage)    Dysrhythmia    Hyperlipidemia    Internal hemorrhoids without mention of complication    Personal history of colonic polyps 01/17/2002   adenomatous, tublar adenoma 02/10/2007   PNA (pneumonia) 03/31/2012   Past Surgical History:  Procedure Laterality Date   ATRIAL FIBRILLATION ABLATION  11/28/2016   ATRIAL FIBRILLATION ABLATION N/A 11/28/2016   Procedure: Atrial Fibrillation Ablation;  Surgeon: Regan Lemming, MD;  [facility]: [facility];  Service: Cardiovascular;  Laterality: N/A;   ATRIAL FIBRILLATION ABLATION N/A 03/18/2019   Procedure: ATRIAL FIBRILLATION ABLATION;  Surgeon: Regan Lemming, MD;  Location: [facility];  Service: Cardiovascular;  Laterality: N/A;   CARDIOVERSION  05/03/2012   Procedure: CARDIOVERSION;  Surgeon: [name], MD;  Location: [facility] ENDOSCOPY;  Service: Cardiovascular;  Laterality: N/A;   CARDIOVERSION  06/08/2012   Procedure: CARDIOVERSION;  Surgeon: [name], MD;  Location: [facility] ENDOSCOPY;  Service: Cardiovascular;  Laterality: N/A;  Lawrence [place]se[place] Wille Celeste   CARDIOVERSION N/A 10/28/2013   Procedure: CARDIOVERSION;  Surgeon: [name], MD;  Location: [facility]  ENDOSCOPY;  Service: Cardiovascular;  Laterality: N/A;   CARDIOVERSION N/A 06/30/2014   Procedure: CARDIOVERSION;  Surgeon: [name], MD;  Location: [facility] ENDOSCOPY;  Service: Cardiovascular;  Laterality: N/A;  H&P in file   CARDIOVERSION N/A 09/05/2014   Procedure: CARDIOVERSION;  Surgeon: [name], MD;  Location: [facility] ENDOSCOPY;  Service: Cardiovascular;  Laterality: N/A;   CARDIOVERSION N/A 12/24/2015   Procedure: CARDIOVERSION;  Surgeon: [name], MD;  Location: [facility] ENDOSCOPY;  Service: Cardiovascular;  Laterality: N/A;   CARDIOVERSION N/A 01/30/2016   Procedure: CARDIOVERSION;  Surgeon: [name], MD;  Location: [facility] ENDOSCOPY;  Service: Cardiovascular;  Laterality: N/A;   CARDIOVERSION N/A 02/08/2016   Procedure: CARDIOVERSION;  Surgeon: [name], MD;  Location: [facility] ENDOSCOPY;  Service: Cardiovascular;  Laterality: N/A;   CARDIOVERSION N/A 02/08/2019   Procedure: CARDIOVERSION;  Surgeon: [name], MD;  Location: [facility] ENDOSCOPY;  Service: Cardiovascular;  Laterality: N/A;   CARDIOVERSION N/A 05/24/2019   Procedure: CARDIOVERSION;  Surgeon: Parke Poisson, MD;  Location: [facility] ENDOSCOPY;  Service: Cardiovascular;  Laterality: N/A;   CARDIOVERSION N/A 09/21/2019   Procedure: CARDIOVERSION;  Surgeon: [name], MD;  Location: [facility] ENDOSCOPY;  Service: Cardiovascular;  Laterality: N/A;   CARDIOVERSION N/A 12/01/2019   Procedure: CARDIOVERSION;  Surgeon: [name], MD;  Location: [facility] ENDOSCOPY;  Service: Cardiovascular;  Laterality: N/A;   CARDIOVERSION N/A 08/20/2021   Procedure: CARDIOVERSION;  Surgeon: [name], MD;  Location: [facility] ENDOSCOPY;  Service: Cardiovascular;  Laterality: N/A;  CARDIOVERSION N/A 10/18/2021   Procedure: CARDIOVERSION;  Surgeon: Adrian Prows, MD;  Location: [place];  Service: Cardiovascular;  Laterality: N/A;   CATARACT EXTRACTION W/ INTRAOCULAR LENS  IMPLANT, BILATERAL Bilateral    COLONOSCOPY     INGUINAL HERNIA REPAIR Bilateral    as child   INTRAMEDULLARY (IM) NAIL  INTERTROCHANTERIC Right 12/29/2021   Procedure: INTRAMEDULLARY (IM) NAIL INTERTROCHANTRIC;  Surgeon: Erle Crocker, MD;  Location: [place];  Service: Orthopedics;  Laterality: Right;   KNEE ARTHROSCOPY Right 2004   Patient Active Problem List   Diagnosis Date Noted   Closed right hip fracture ([place]) 12/28/2021   HLD (hyperlipidemia) 12/28/2021   Change in bowel habits 12/05/2021   Generalized abdominal pain 12/05/2021   BPH with elevated PSA 12/13/2019   Dermatitis 12/13/2019   Encounter for Medicare annual wellness exam 12/13/2019   Chronic pain of right knee 10/10/2019   Acquired thrombophilia ([place]) 05/17/2019   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/06/2018   Primary osteoarthritis of right knee 08/06/2018   Pes anserinus bursitis of left knee 10/31/2016   Allergic rhinitis 02/14/2016   Hearing loss 02/13/2016   CKD (chronic kidney disease) stage 3, GFR 30-59 ml/min (HCC) 02/13/2016   Paroxysmal atrial fibrillation ([place]) 03/31/2012   Hypertension 03/31/2012   Hemorrhoids, external 05/15/2011    PCP: [place], [place]: Christain Sacramento, MD, Melony Overly (hip)  REFERRING DIAG: S/P R hip fracture/ IM repair 12/29/21,   S/P R TKE (uni)   THERAPY DIAG:  Pain in right thigh  Pain in right hip  Stiffness of right ankle, not elsewhere classified  Pain in right ankle and joints of right foot  Other abnormalities of gait and mobility  ONSET DATE:  Surgery date: 09/11/21 R Uni TKA   SUBJECTIVE:   SUBJECTIVE STATEMENT: Pt already being seen in PT.   Pt fell on 7/15, had surgery on 7/16, d/c on 7/17.  Tripped over garden hose, at home, fell onto R hip, suffered fx. Had R IM nailing of femoral neck. Doing fairly well, does have increased pain in hip.  No increased pain in knee.  Using Standard walker,  WBAT.    PERTINENT HISTORY: Chronic A-Fib,   PAIN:  Are you having pain? Yes: NPRS scale: 1/10 Pain location: R knee Pain  description: achey Aggravating factors: increased activity  Relieving factors: Rest, ice   Are you having pain? Yes: NPRS scale: 5/10 Pain location: R hip Pain description: achey, sore  Aggravating factors: increased activity , movement  Relieving factors: Rest, ice    PRECAUTIONS: None  WEIGHT BEARING RESTRICTIONS No  FALLS:  Has patient fallen in last 6 months? Yes. Number of falls 1  PLOF: Independent  PATIENT GOALS  Decreased pain   OBJECTIVE:  updated 01/01/22   COGNITION:  Overall cognitive status: Within functional limits for tasks assessed      PALPATION: Mild soreness in proximal rec fem, prox and mid vastus lateralis, mid ITB,    LE ROM:  Hip ROM: Mild limitation  Passive ROM Right 12/12/21 R 01/01/22                                                      Knee flexion 120       Knee extension 0  Hip flexion  ~90 deg                       (Blank rows = not tested)   LE MMT:  R Knee: 4-/5,  R hip: 3-/5   TODAY'S TREATMENT: 01/01/2022  Therapeutic Exercise: Aerobic:  Supine:  Heel slides x 15 on R, ankle pumps x 15 on R Seated: Sit to stand x 3 education on mechanics, LAQ 2x10 on R,  Standing:  Stretches:  Gait:  Standard walker 50 ft with education on LE mechanics and use of RW.  Neuromuscular Re-education: Manual Therapy:   PATIENT EDUCATION:  Education details: updated and reviewed HEP and POC, discussed precautions and activities to avoid.  Person educated: Patient Education method: Explanation, Demonstration, Tactile cues, Verbal cues, and Handouts Education comprehension: verbalized understanding, returned demonstration, verbal cues required, tactile cues required, and needs further education   HOME EXERCISE PROGRAM: Access Code: [email or web address] Access Code: [email or web address] Access Code: [email or web address] - s/p R hip fx    ASSESSMENT:  CLINICAL IMPRESSION: 01/01/22: Re-Eval today, for addition of new diagnosis and change in  status. Pt had fall with hip fracture, and IM nailing on 7/16. Pt with decreased strength and mobility in R hip, with increased pain. He continues to have other deficits as previously, with stiffness in R ankle, s/p R TKE, and soreness in R thigh/quad. Pt with decreased ability for full functional mobility due to pain and dysfunction. Pt to benefit from skilled PT to improve.   OBJECTIVE IMPAIRMENTS Abnormal gait, decreased activity tolerance, decreased balance, decreased endurance, decreased knowledge of use of DME, decreased mobility, difficulty walking, decreased ROM, decreased strength, hypomobility, increased muscle spasms, impaired flexibility, and pain.   ACTIVITY LIMITATIONS cleaning, community activity, driving, meal prep, yard work, and shopping.   PERSONAL FACTORS  none  are also affecting patient's functional outcome.    REHAB POTENTIAL: Good  CLINICAL DECISION MAKING: Stable/uncomplicated  EVALUATION COMPLEXITY: Low   GOALS: Goals reviewed with patient? Yes  SHORT TERM GOALS: Target date: 01/15/22  Pt to be independent with initial HEP Goal status: INITIAL   .  Pt to report pain levels 0-3/10 in R hip with activity.  Goal status: INITIAL    LONG TERM GOALS: Target date: 02/12/2022  Pt to be independent with final HEP Goal status: INITIAL  2.  Pt to report decreased pain in R knee to 0-2/10 with activity  Goal status: INITIAL  3.  Pt to demo soft tissue limitations in R quad and thigh to be WNL, to improve pain and function.   Goal status: INITIAL  4.  Pt to demo independent ambulation for community distances, without pain in knee, thigh or hip , Goal status: INITIAL  5.  Pt to demo improved strength of R hip to at least 4/5 to improve stability, gait, and stairs.   Goal status: INITIAL       PLAN: PT FREQUENCY: 1-2x/week  PT DURATION: 6 weeks  PLANNED INTERVENTIONS: Therapeutic exercises, Therapeutic activity, Neuromuscular re-education, Balance  training, Gait training, Patient/Family education, Joint mobilization, Stair training, DME instructions, Dry Needling, Electrical stimulation, Spinal manipulation, Spinal mobilization, Cryotherapy, Moist heat, Taping, Vasopneumatic device, Ultrasound, Ionotophoresis 4mg /ml Dexamethasone, and Manual therapy  PLAN FOR NEXT SESSION: Manual for R quad/ITB, needling as needed, strength for R hip, ankle mobility/gait.   , PT, DPT 8:50 PM  01/01/22

## 2022-01-06 ENCOUNTER — Ambulatory Visit: Payer: BLUE CROSS/BLUE SHIELD | Admitting: Cardiology

## 2022-01-07 ENCOUNTER — Ambulatory Visit (INDEPENDENT_AMBULATORY_CARE_PROVIDER_SITE_OTHER): Payer: Medicare Other | Admitting: Physical Therapy

## 2022-01-07 ENCOUNTER — Encounter: Payer: Self-pay | Admitting: Physical Therapy

## 2022-01-07 DIAGNOSIS — M25671 Stiffness of right ankle, not elsewhere classified: Secondary | ICD-10-CM | POA: Diagnosis not present

## 2022-01-07 DIAGNOSIS — M25551 Pain in right hip: Secondary | ICD-10-CM | POA: Diagnosis not present

## 2022-01-07 DIAGNOSIS — R2689 Other abnormalities of gait and mobility: Secondary | ICD-10-CM

## 2022-01-07 DIAGNOSIS — M25571 Pain in right ankle and joints of right foot: Secondary | ICD-10-CM | POA: Diagnosis not present

## 2022-01-07 DIAGNOSIS — M79651 Pain in right thigh: Secondary | ICD-10-CM

## 2022-01-07 NOTE — Therapy (Signed)
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 01/12/2020   Arthritis    "right knee" (01/28/2016)   Atypical atrial flutter ([place]) 05/17/2019   Diverticulosis of colon (without mention of hemorrhage)    Dysrhythmia    Hyperlipidemia    Internal hemorrhoids without mention of complication    Personal history of colonic polyps 01/17/2002   adenomatous, tublar adenoma 02/10/2007   PNA (pneumonia) 03/31/2012   Past Surgical History:  Procedure Laterality Date   ATRIAL FIBRILLATION ABLATION  11/28/2016   ATRIAL FIBRILLATION ABLATION N/A 11/28/2016   Procedure: Atrial Fibrillation Ablation;  Surgeon: Constance Haw, MD;  Location: [place] CV LAB;  Service: Cardiovascular;  Laterality: N/A;   ATRIAL FIBRILLATION ABLATION N/A 03/18/2019   Procedure: ATRIAL FIBRILLATION ABLATION;  Surgeon: Constance Haw, MD;  Location: [place] CV LAB;  Service: Cardiovascular;  Laterality: N/A;   CARDIOVERSION  05/03/2012   Procedure: CARDIOVERSION;  Surgeon: Laverda Page, MD;  Location: [place];  Service: Cardiovascular;  Laterality: N/A;   CARDIOVERSION  06/08/2012   Procedure: CARDIOVERSION;  Surgeon: Laverda Page, MD;  Location: [place];  Service: Cardiovascular;  Laterality: N/A;  [name]   CARDIOVERSION N/A 10/28/2013   Procedure: CARDIOVERSION;  Surgeon: Laverda Page, MD;  Location: [place];  Service: Cardiovascular;  Laterality: N/A;   CARDIOVERSION N/A 06/30/2014   Procedure: CARDIOVERSION;  Surgeon: Laverda Page, MD;  Location: [place];  Service: Cardiovascular;  Laterality: N/A;  H&P in file   CARDIOVERSION N/A 09/05/2014   Procedure: CARDIOVERSION;  Surgeon: Adrian Prows, MD;  Location: [place];  Service: Cardiovascular;  Laterality: N/A;   CARDIOVERSION N/A 12/24/2015   Procedure: CARDIOVERSION;  Surgeon: Adrian Prows, MD;  Location: [place];  Service: Cardiovascular;  Laterality: N/A;   CARDIOVERSION N/A 01/30/2016   Procedure: CARDIOVERSION;  Surgeon: Adrian Prows, MD;  Location: [place];  Service: Cardiovascular;  Laterality: N/A;   CARDIOVERSION N/A 02/08/2016   Procedure: CARDIOVERSION;  Surgeon: Adrian Prows, MD;  Location: [place];  Service: Cardiovascular;  Laterality: N/A;   CARDIOVERSION N/A 02/08/2019   Procedure: CARDIOVERSION;  Surgeon: Adrian Prows, MD;  Location: [place];  Service: Cardiovascular;  Laterality: N/A;   CARDIOVERSION N/A 05/24/2019   Procedure: CARDIOVERSION;  Surgeon: Elouise Munroe, MD;  Location: [facility] ENDOSCOPY;  Service: Cardiovascular;  Laterality: N/A;   CARDIOVERSION N/A 09/21/2019   Procedure: CARDIOVERSION;  Surgeon: Adrian Prows, MD;  Location: [facility] ENDOSCOPY;  Service: Cardiovascular;  Laterality: N/A;   CARDIOVERSION N/A 12/01/2019   Procedure: CARDIOVERSION;  Surgeon: Adrian Prows, MD;  Location: [place];  Service: Cardiovascular;  Laterality: N/A;   CARDIOVERSION N/A 08/20/2021   Procedure: CARDIOVERSION;  Surgeon: Adrian Prows, MD;  Location: [place];  Service: Cardiovascular;  Laterality: N/A;  CARDIOVERSION N/A 10/18/2021   Procedure: CARDIOVERSION;  Surgeon: [name], MD;  Location: [facility] ENDOSCOPY;  Service: Cardiovascular;  Laterality: N/A;   CATARACT EXTRACTION W/ INTRAOCULAR LENS  IMPLANT, BILATERAL Bilateral    COLONOSCOPY     INGUINAL HERNIA REPAIR Bilateral    as child   INTRAMEDULLARY (IM) NAIL  INTERTROCHANTERIC Right 12/29/2021   Procedure: INTRAMEDULLARY (IM) NAIL INTERTROCHANTRIC;  Surgeon: Terance Hart, MD;  Location: [facility] OR;  Service: Orthopedics;  Laterality: Right;   KNEE ARTHROSCOPY Right 2004   Patient Active Problem List   Diagnosis Date Noted   Closed right hip fracture (HCC) 12/28/2021   HLD (hyperlipidemia) 12/28/2021   Change in bowel habits 12/05/2021   Generalized abdominal pain 12/05/2021   BPH with elevated PSA 12/13/2019   Dermatitis 12/13/2019   Encounter for Medicare annual wellness exam 12/13/2019   Chronic pain of right knee 10/10/2019   Acquired thrombophilia (HCC) 05/17/2019   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/06/2018   Primary osteoarthritis of right knee 08/06/2018   Pes anserinus bursitis of left knee 10/31/2016   Allergic rhinitis 02/14/2016   Hearing loss 02/13/2016   CKD (chronic kidney disease) stage 3, GFR 30-59 ml/min (HCC) 02/13/2016   Paroxysmal atrial fibrillation (HCC) 03/31/2012   Hypertension 03/31/2012   Hemorrhoids, external 05/15/2011    [facility]  REFERRING PROVIDER: Roe Coombs*, Dub Mikes (hip)  REFERRING DIAG: S/P R hip fracture/ IM repair 12/29/21,   S/P R TKE (uni)   THERAPY DIAG:  Pain in right hip  Pain in right thigh  Stiffness of right ankle, not elsewhere classified  Pain in right ankle and joints of right foot  Other abnormalities of gait and mobility  ONSET DATE:  Surgery date: 09/11/21 R Uni TKA   SUBJECTIVE:   SUBJECTIVE STATEMENT: Pt states soreness in hip, having to take pain meds. States difficulty getting around.    PERTINENT HISTORY: Chronic A-Fib,   PAIN:  Are you having pain? Yes: NPRS scale: 1/10 Pain location: R knee Pain description: achey Aggravating factors: increased activity  Relieving factors: Rest, ice   Are you having pain? Yes: NPRS scale: 5/10 Pain location: R hip Pain description: achey, sore  Aggravating  factors: increased activity , movement  Relieving factors: Rest, ice    PRECAUTIONS: None  WEIGHT BEARING RESTRICTIONS No  FALLS:  Has patient fallen in last 6 months? Yes. Number of falls 1  PLOF: Independent  PATIENT GOALS  Decreased pain   OBJECTIVE:  updated 01/01/22   COGNITION:  Overall cognitive status: Within functional limits for tasks assessed      PALPATION: Mild soreness in proximal rec fem, prox and mid vastus lateralis, mid ITB,    LE ROM:  Hip ROM: Mild limitation  Passive ROM Right 12/12/21 R 01/01/22                                                      Knee flexion 120       Knee extension 0               Hip flexion  ~90 deg                       (Blank rows = not tested)   LE MMT:  R Knee: 4-/5,  R hip: 3-/5    TODAY'S TREATMENT:  01/07/2022 Therapeutic Exercise: Aerobic:  Supine:  Heel slides x 10 on R, ankle pumps x 15 on R Seated:  LAQ 2x10 on R,  Knee flexion ROM x 10;  sit to stand x 3;  Standing:  HSC for knee flexion ROM x 15 on R; HR x 10;  Stretches:  Gait:  Standard walker 45 ft 4,  with education on LE mechanics and use of RW.  Neuromuscular Re-education: Manual Therapy: seated: knee flexion ROM for light quad stretch    PATIENT EDUCATION:  Education details: updated and reviewed HEP   Person educated: Patient Education method: Explanation, Demonstration, Tactile cues, Verbal cues, and Handouts Education comprehension: verbalized understanding, returned demonstration, verbal cues required, tactile cues required, and needs further education   HOME EXERCISE PROGRAM: Access Code: [email or web address] Access Code: [email or web address] Access Code: [email or web address] - s/p R hip fx    ASSESSMENT:  CLINICAL IMPRESSION: 01/07/22: Pt with soreness in R lateral thigh today, somewhat limiting for activity. He has limited ability for R knee flexion today due to tightness and soreness in thigh. Improved R knee ROM and quad ROM after session  today. Improved gait mechanics and tolerance today from last week, with ability for step through gait, and continuous push of RW vs step to gait. Discussed continuing light activity as tolerated for HEP and not over doing activity, if painful.   OBJECTIVE IMPAIRMENTS Abnormal gait, decreased activity tolerance, decreased balance, decreased endurance, decreased knowledge of use of DME, decreased mobility, difficulty walking, decreased ROM, decreased strength, hypomobility, increased muscle spasms, impaired flexibility, and pain.   ACTIVITY LIMITATIONS cleaning, community activity, driving, meal prep, yard work, and shopping.   PERSONAL FACTORS  none  are also affecting patient's functional outcome.    REHAB POTENTIAL: Good  CLINICAL DECISION MAKING: Stable/uncomplicated  EVALUATION COMPLEXITY: Low   GOALS: Goals reviewed with patient? Yes  SHORT TERM GOALS: Target date: 01/15/22  Pt to be independent with initial HEP Goal status: INITIAL   .  Pt to report pain levels 0-3/10 in R hip with activity.  Goal status: INITIAL    LONG TERM GOALS: Target date: 02/12/2022  Pt to be independent with final HEP Goal status: INITIAL  2.  Pt to report decreased pain in R knee to 0-2/10 with activity  Goal status: INITIAL  3.  Pt to demo soft tissue limitations in R quad and thigh to be WNL, to improve pain and function.   Goal status: INITIAL  4.  Pt to demo independent ambulation for community distances, without pain in knee, thigh or hip , Goal status: INITIAL  5.  Pt to demo improved strength of R hip to at least 4/5 to improve stability, gait, and stairs.   Goal status: INITIAL       PLAN: PT FREQUENCY: 1-2x/week  PT DURATION: 6 weeks  PLANNED INTERVENTIONS: Therapeutic exercises, Therapeutic activity, Neuromuscular re-education, Balance training, Gait training, Patient/Family education, Joint mobilization, Stair training, DME instructions, Dry Needling, Electrical  stimulation, Spinal manipulation, Spinal mobilization, Cryotherapy, Moist heat, Taping, Vasopneumatic device, Ultrasound, Ionotophoresis 4mg /ml Dexamethasone, and Manual therapy  PLAN FOR NEXT SESSION: Manual for R quad/ITB, needling as needed, strength for R hip, ankle mobility/gait.   , PT, DPT 1:47 PM  01/07/22

## 2022-01-08 ENCOUNTER — Ambulatory Visit: Payer: BLUE CROSS/BLUE SHIELD | Admitting: Cardiology

## 2022-01-08 ENCOUNTER — Telehealth: Payer: Self-pay | Admitting: Physical Therapy

## 2022-01-08 NOTE — Telephone Encounter (Signed)
Patient requests to be called at ph# [phone] to discuss Patient states he has a new pain in groin area-right side.

## 2022-01-09 ENCOUNTER — Ambulatory Visit (INDEPENDENT_AMBULATORY_CARE_PROVIDER_SITE_OTHER): Payer: Medicare Other | Admitting: Physical Therapy

## 2022-01-09 ENCOUNTER — Encounter: Payer: Self-pay | Admitting: Physical Therapy

## 2022-01-09 DIAGNOSIS — M25551 Pain in right hip: Secondary | ICD-10-CM

## 2022-01-09 DIAGNOSIS — M25671 Stiffness of right ankle, not elsewhere classified: Secondary | ICD-10-CM | POA: Diagnosis not present

## 2022-01-09 DIAGNOSIS — M79651 Pain in right thigh: Secondary | ICD-10-CM

## 2022-01-09 DIAGNOSIS — M25571 Pain in right ankle and joints of right foot: Secondary | ICD-10-CM | POA: Diagnosis not present

## 2022-01-09 DIAGNOSIS — R2689 Other abnormalities of gait and mobility: Secondary | ICD-10-CM

## 2022-01-09 NOTE — Therapy (Signed)
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 01/12/2020   Arthritis    "right knee" (01/28/2016)   Atypical atrial flutter (HCC) 05/17/2019   Diverticulosis of colon (without mention of hemorrhage)    Dysrhythmia    Hyperlipidemia    Internal hemorrhoids without mention of complication    Personal history of colonic polyps 01/17/2002   adenomatous, tublar adenoma 02/10/2007   PNA (pneumonia) 03/31/2012   Past Surgical History:  Procedure Laterality Date   ATRIAL FIBRILLATION ABLATION  11/28/2016   ATRIAL FIBRILLATION ABLATION N/A 11/28/2016   Procedure: Atrial Fibrillation Ablation;  Surgeon: Regan Lemming, MD;  Location: [facility];  Service: Cardiovascular;  Laterality: N/A;   ATRIAL FIBRILLATION ABLATION N/A 03/18/2019   Procedure: ATRIAL FIBRILLATION ABLATION;  Surgeon: Regan Lemming, MD;  Location: [facility];  Service: Cardiovascular;  Laterality: N/A;   CARDIOVERSION  05/03/2012   Procedure: CARDIOVERSION;  Surgeon: [name], MD;  Location: [facility] ENDOSCOPY;  Service: Cardiovascular;  Laterality: N/A;   CARDIOVERSION  06/08/2012   Procedure: CARDIOVERSION;  Surgeon: [name], MD;  Location: [facility] ENDOSCOPY;  Service: Cardiovascular;  Laterality: N/A;  [place] Wille Celeste   CARDIOVERSION N/A 10/28/2013   Procedure: CARDIOVERSION;  Surgeon: [name], MD;  Location: [facility]  ENDOSCOPY;  Service: Cardiovascular;  Laterality: N/A;   CARDIOVERSION N/A 06/30/2014   Procedure: CARDIOVERSION;  Surgeon: [name], MD;  Location: [facility] ENDOSCOPY;  Service: Cardiovascular;  Laterality: N/A;  H&P in file   CARDIOVERSION N/A 09/05/2014   Procedure: CARDIOVERSION;  Surgeon: [name], MD;  Location: [facility] ENDOSCOPY;  Service: Cardiovascular;  Laterality: N/A;   CARDIOVERSION N/A 12/24/2015   Procedure: CARDIOVERSION;  Surgeon: [name], MD;  Location: [facility] ENDOSCOPY;  Service: Cardiovascular;  Laterality: N/A;   CARDIOVERSION N/A 01/30/2016   Procedure: CARDIOVERSION;  Surgeon: [name], MD;  Location: [facility] ENDOSCOPY;  Service: Cardiovascular;  Laterality: N/A;   CARDIOVERSION N/A 02/08/2016   Procedure: CARDIOVERSION;  Surgeon: [name], MD;  Location: [facility] ENDOSCOPY;  Service: Cardiovascular;  Laterality: N/A;   CARDIOVERSION N/A 02/08/2019   Procedure: CARDIOVERSION;  Surgeon: [name], MD;  Location: [facility] ENDOSCOPY;  Service: Cardiovascular;  Laterality: N/A;   CARDIOVERSION N/A 05/24/2019   Procedure: CARDIOVERSION;  Surgeon: Parke Poisson, MD;  Location: [facility] ENDOSCOPY;  Service: Cardiovascular;  Laterality: N/A;   CARDIOVERSION N/A 09/21/2019   Procedure: CARDIOVERSION;  Surgeon: [name], MD;  Location: [facility] ENDOSCOPY;  Service: Cardiovascular;  Laterality: N/A;   CARDIOVERSION N/A 12/01/2019   Procedure: CARDIOVERSION;  Surgeon: [name], MD;  Location: [facility] ENDOSCOPY;  Service: Cardiovascular;  Laterality: N/A;   CARDIOVERSION N/A 08/20/2021   Procedure: CARDIOVERSION;  Surgeon: [name], MD;  Location: [facility] ENDOSCOPY;  Service: Cardiovascular;  Laterality: N/A;  CARDIOVERSION N/A 10/18/2021   Procedure: CARDIOVERSION;  Surgeon: Adrian Prows, MD;  Location: [place];  Service: Cardiovascular;  Laterality: N/A;   CATARACT EXTRACTION W/ INTRAOCULAR LENS  IMPLANT, BILATERAL Bilateral    COLONOSCOPY     INGUINAL HERNIA REPAIR Bilateral    as child   INTRAMEDULLARY (IM) NAIL  INTERTROCHANTERIC Right 12/29/2021   Procedure: INTRAMEDULLARY (IM) NAIL INTERTROCHANTRIC;  Surgeon: Erle Crocker, MD;  Location: [place];  Service: Orthopedics;  Laterality: Right;   KNEE ARTHROSCOPY Right 2004   Patient Active Problem List   Diagnosis Date Noted   Closed right hip fracture ([place]) 12/28/2021   HLD (hyperlipidemia) 12/28/2021   Change in bowel habits 12/05/2021   Generalized abdominal pain 12/05/2021   BPH with elevated PSA 12/13/2019   Dermatitis 12/13/2019   Encounter for Medicare annual wellness exam 12/13/2019   Chronic pain of right knee 10/10/2019   Acquired thrombophilia ([place]) 05/17/2019   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/06/2018   Primary osteoarthritis of right knee 08/06/2018   Pes anserinus bursitis of left knee 10/31/2016   Allergic rhinitis 02/14/2016   Hearing loss 02/13/2016   CKD (chronic kidney disease) stage 3, GFR 30-59 ml/min (HCC) 02/13/2016   Paroxysmal atrial fibrillation ([place]) 03/31/2012   Hypertension 03/31/2012   Hemorrhoids, external 05/15/2011    [place] PROVIDER: Josefa Half*, Melony Overly (hip)  REFERRING DIAG: S/P R hip fracture/ IM repair 12/29/21,   S/P R TKE (uni)   THERAPY DIAG:  Pain in right hip  Pain in right thigh  Stiffness of right ankle, not elsewhere classified  Pain in right ankle and joints of right foot  Other abnormalities of gait and mobility  ONSET DATE:  Surgery date: 09/11/21 R Uni TKA   SUBJECTIVE:   SUBJECTIVE STATEMENT: Pt states soreness in hip,   PERTINENT HISTORY: Chronic A-Fib,   PAIN:  Are you having pain? Yes: NPRS scale: 1/10 Pain location: R knee Pain description: achey Aggravating factors: increased activity  Relieving factors: Rest, ice   Are you having pain? Yes: NPRS scale: 5/10 Pain location: R hip Pain description: achey, sore  Aggravating factors: increased activity , movement  Relieving factors: Rest,  ice    PRECAUTIONS: None  WEIGHT BEARING RESTRICTIONS No  FALLS:  Has patient fallen in last 6 months? Yes. Number of falls 1  PLOF: Independent  PATIENT GOALS  Decreased pain   OBJECTIVE:  updated 01/01/22   COGNITION:  Overall cognitive status: Within functional limits for tasks assessed      PALPATION: Mild soreness in proximal rec fem, prox and mid vastus lateralis, mid ITB,    LE ROM:  Hip ROM: Mild limitation  Passive ROM Right 12/12/21 R 01/01/22                                                      Knee flexion 120       Knee extension 0               Hip flexion  ~90 deg                       (Blank rows = not tested)   LE MMT:  R Knee: 4-/5,  R hip: 3-/5    TODAY'S TREATMENT:  01/09/2022  Therapeutic Exercise: Aerobic:  Supine:  Heel slides with strap x 10 on R, Quad sets x 20 bil;  SAQ x 20 on R; Hip add pillow squeeze x 15  Seated:  LAQ 2x10 on R,  Knee flexion ROM x 20;  sit to stand x 3;  Standing:  HSC for knee flexion ROM x 15 on R; March with R x 10; Stretches:  Gait:  Rollator walker 45 ft 6,   Neuromuscular Re-education: Manual Therapy: seated and s/l : knee flexion ROM for light quad stretch    PATIENT EDUCATION:  Education details:  reviewed HEP   Person educated: Patient Education method: Programmer, multimedia, Demonstration, Actor cues, Verbal cues, and Handouts Education comprehension: verbalized understanding, returned demonstration, verbal cues required, tactile cues required, and needs further education   HOME EXERCISE PROGRAM: Access Code: [email or web address] knee Access Code: [email or web address] Access Code: [email or web address] - s/p R hip fx    ASSESSMENT:  CLINICAL IMPRESSION: 01/07/22: Pt still having increased pain with standing, walking, and activity. He does have improving gait mechanics and ability, still requiring use of rolling walker. Limited ability for hip and knee flexion in supine, still only about 85 deg of knee flexion and 40 deg of  hip flexion, due to pain at this level. Plan to progress hip mobility and strength as able.    OBJECTIVE IMPAIRMENTS Abnormal gait, decreased activity tolerance, decreased balance, decreased endurance, decreased knowledge of use of DME, decreased mobility, difficulty walking, decreased ROM, decreased strength, hypomobility, increased muscle spasms, impaired flexibility, and pain.   ACTIVITY LIMITATIONS cleaning, community activity, driving, meal prep, yard work, and shopping.   PERSONAL FACTORS  none  are also affecting patient's functional outcome.    REHAB POTENTIAL: Good  CLINICAL DECISION MAKING: Stable/uncomplicated  EVALUATION COMPLEXITY: Low   GOALS: Goals reviewed with patient? Yes  SHORT TERM GOALS: Target date: 01/15/22  Pt to be independent with initial HEP Goal status: INITIAL   .  Pt to report pain levels 0-3/10 in R hip with activity.  Goal status: INITIAL    LONG TERM GOALS: Target date: 02/12/2022  Pt to be independent with final HEP Goal status: INITIAL  2.  Pt to report decreased pain in R knee to 0-2/10 with activity  Goal status: INITIAL  3.  Pt to demo soft tissue limitations in R quad and thigh to be WNL, to improve pain and function.   Goal status: INITIAL  4.  Pt to demo independent ambulation for community distances, without pain in knee, thigh or hip , Goal status: INITIAL  5.  Pt to demo improved strength of R hip to at least 4/5 to improve stability, gait, and stairs.   Goal status: INITIAL       PLAN: PT FREQUENCY: 1-2x/week  PT DURATION: 6 weeks  PLANNED INTERVENTIONS: Therapeutic exercises, Therapeutic activity, Neuromuscular re-education, Balance training, Gait training, Patient/Family education, Joint mobilization, Stair training, DME instructions, Dry Needling, Electrical stimulation, Spinal manipulation, Spinal mobilization, Cryotherapy, Moist heat, Taping, Vasopneumatic device, Ultrasound, Ionotophoresis 4mg /ml Dexamethasone,  and Manual therapy  PLAN FOR NEXT SESSION: Manual for R quad/ITB, needling as needed, strength for R hip, ankle mobility/gait.   , PT, DPT 1:01 PM  01/09/22

## 2022-01-10 ENCOUNTER — Encounter: Payer: Self-pay | Admitting: Cardiology

## 2022-01-13 NOTE — Telephone Encounter (Signed)
From pt

## 2022-01-14 ENCOUNTER — Encounter: Payer: Self-pay | Admitting: Physical Therapy

## 2022-01-14 ENCOUNTER — Ambulatory Visit (INDEPENDENT_AMBULATORY_CARE_PROVIDER_SITE_OTHER): Payer: Medicare Other | Admitting: Physical Therapy

## 2022-01-14 DIAGNOSIS — R2689 Other abnormalities of gait and mobility: Secondary | ICD-10-CM

## 2022-01-14 DIAGNOSIS — M25571 Pain in right ankle and joints of right foot: Secondary | ICD-10-CM | POA: Diagnosis not present

## 2022-01-14 DIAGNOSIS — M79651 Pain in right thigh: Secondary | ICD-10-CM

## 2022-01-14 DIAGNOSIS — M25551 Pain in right hip: Secondary | ICD-10-CM | POA: Diagnosis not present

## 2022-01-14 DIAGNOSIS — M25671 Stiffness of right ankle, not elsewhere classified: Secondary | ICD-10-CM

## 2022-01-14 NOTE — Therapy (Signed)
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 01/12/2020   Arthritis    "right knee" (01/28/2016)   Atypical atrial flutter ([place]) 05/17/2019   Diverticulosis of colon (without mention of hemorrhage)    Dysrhythmia    Hyperlipidemia    Internal hemorrhoids without mention of complication    Personal history of colonic polyps 01/17/2002   adenomatous, tublar adenoma 02/10/2007   PNA (pneumonia) 03/31/2012   Past Surgical History:  Procedure Laterality Date   ATRIAL FIBRILLATION ABLATION  11/28/2016   ATRIAL FIBRILLATION ABLATION N/A 11/28/2016   Procedure: Atrial Fibrillation Ablation;  Surgeon: Constance Haw, MD;  Location: [place] CV LAB;  Service: Cardiovascular;  Laterality: N/A;   ATRIAL FIBRILLATION ABLATION N/A 03/18/2019   Procedure: ATRIAL FIBRILLATION ABLATION;  Surgeon: Constance Haw, MD;  Location: [place] CV LAB;  Service: Cardiovascular;  Laterality: N/A;   CARDIOVERSION  05/03/2012   Procedure: CARDIOVERSION;  Surgeon: Laverda Page, MD;  Location: [place];  Service: Cardiovascular;  Laterality: N/A;   CARDIOVERSION  06/08/2012   Procedure: CARDIOVERSION;  Surgeon: Laverda Page, MD;  Location: [place];  Service: Cardiovascular;  Laterality: N/A;  [name]   CARDIOVERSION N/A 10/28/2013   Procedure: CARDIOVERSION;  Surgeon: Laverda Page, MD;  Location: [place];  Service: Cardiovascular;  Laterality: N/A;   CARDIOVERSION N/A 06/30/2014   Procedure: CARDIOVERSION;  Surgeon: Laverda Page, MD;  Location: [place];  Service: Cardiovascular;  Laterality: N/A;  H&P in file   CARDIOVERSION N/A 09/05/2014   Procedure: CARDIOVERSION;  Surgeon: Adrian Prows, MD;  Location: [place];  Service: Cardiovascular;  Laterality: N/A;   CARDIOVERSION N/A 12/24/2015   Procedure: CARDIOVERSION;  Surgeon: Adrian Prows, MD;  Location: [place];  Service: Cardiovascular;  Laterality: N/A;   CARDIOVERSION N/A 01/30/2016   Procedure: CARDIOVERSION;  Surgeon: Adrian Prows, MD;  Location: [place];  Service: Cardiovascular;  Laterality: N/A;   CARDIOVERSION N/A 02/08/2016   Procedure: CARDIOVERSION;  Surgeon: Adrian Prows, MD;  Location: [place];  Service: Cardiovascular;  Laterality: N/A;   CARDIOVERSION N/A 02/08/2019   Procedure: CARDIOVERSION;  Surgeon: Adrian Prows, MD;  Location: [place];  Service: Cardiovascular;  Laterality: N/A;   CARDIOVERSION N/A 05/24/2019   Procedure: CARDIOVERSION;  Surgeon: Elouise Munroe, MD;  Location: [facility] ENDOSCOPY;  Service: Cardiovascular;  Laterality: N/A;   CARDIOVERSION N/A 09/21/2019   Procedure: CARDIOVERSION;  Surgeon: Adrian Prows, MD;  Location: [facility] ENDOSCOPY;  Service: Cardiovascular;  Laterality: N/A;   CARDIOVERSION N/A 12/01/2019   Procedure: CARDIOVERSION;  Surgeon: Adrian Prows, MD;  Location: [place];  Service: Cardiovascular;  Laterality: N/A;   CARDIOVERSION N/A 08/20/2021   Procedure: CARDIOVERSION;  Surgeon: Adrian Prows, MD;  Location: [place];  Service: Cardiovascular;  Laterality: N/A;  CARDIOVERSION N/A 10/18/2021   Procedure: CARDIOVERSION;  Surgeon: [name], MD;  Location: [facility] ENDOSCOPY;  Service: Cardiovascular;  Laterality: N/A;   CATARACT EXTRACTION W/ INTRAOCULAR LENS  IMPLANT, BILATERAL Bilateral    COLONOSCOPY     INGUINAL HERNIA REPAIR Bilateral    as child   INTRAMEDULLARY (IM) NAIL  INTERTROCHANTERIC Right 12/29/2021   Procedure: INTRAMEDULLARY (IM) NAIL INTERTROCHANTRIC;  Surgeon: Terance Hart, MD;  Location: [facility] OR;  Service: Orthopedics;  Laterality: Right;   KNEE ARTHROSCOPY Right 2004   Patient Active Problem List   Diagnosis Date Noted   Closed right hip fracture (HCC) 12/28/2021   HLD (hyperlipidemia) 12/28/2021   Change in bowel habits 12/05/2021   Generalized abdominal pain 12/05/2021   BPH with elevated PSA 12/13/2019   Dermatitis 12/13/2019   Encounter for Medicare annual wellness exam 12/13/2019   Chronic pain of right knee 10/10/2019   Acquired thrombophilia (HCC) 05/17/2019   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/06/2018   Primary osteoarthritis of right knee 08/06/2018   Pes anserinus bursitis of left knee 10/31/2016   Allergic rhinitis 02/14/2016   Hearing loss 02/13/2016   CKD (chronic kidney disease) stage 3, GFR 30-59 ml/min (HCC) 02/13/2016   Paroxysmal atrial fibrillation (HCC) 03/31/2012   Hypertension 03/31/2012   Hemorrhoids, external 05/15/2011    [facility]  REFERRING PROVIDER: Roe Coombs*, Dub Mikes (hip)  REFERRING DIAG: S/P R hip fracture/ IM repair 12/29/21,   S/P R TKE (uni)   THERAPY DIAG:  Pain in right hip  Pain in right thigh  Stiffness of right ankle, not elsewhere classified  Pain in right ankle and joints of right foot  Other abnormalities of gait and mobility  ONSET DATE:  Surgery date: 09/11/21 R Uni TKA   SUBJECTIVE:   SUBJECTIVE STATEMENT: Pt states some improvements in movement and pain. Still using walker.  PERTINENT HISTORY: Chronic A-Fib,   PAIN:  Are you having pain? Yes: NPRS scale: 1/10 Pain location: R knee Pain description: achey Aggravating factors: increased activity  Relieving factors: Rest, ice   Are you having pain? Yes: NPRS scale: 5/10 Pain location: R hip Pain description: achey, sore  Aggravating factors: increased  activity , movement  Relieving factors: Rest, ice    PRECAUTIONS: None  WEIGHT BEARING RESTRICTIONS No  FALLS:  Has patient fallen in last 6 months? Yes. Number of falls 1  PLOF: Independent  PATIENT GOALS  Decreased pain   OBJECTIVE:  updated 01/01/22   COGNITION:  Overall cognitive status: Within functional limits for tasks assessed      PALPATION: Mild soreness in proximal rec fem, prox and mid vastus lateralis, mid ITB,    LE ROM:  Hip ROM: Mild limitation  Passive ROM Right 12/12/21 R 01/01/22                                                      Knee flexion 120       Knee extension 0               Hip flexion  ~90 deg                       (Blank rows = not tested)   LE MMT:  R Knee: 4-/5,  R hip: 3-/5  TODAY'S TREATMENT:  01/14/2022 Therapeutic Exercise: Aerobic:  Supine:  Heel slides x10, with strap x 10 on R,  Quad sets x 20 bil;  SAQ x 20 on R;  Hip add pillow squeeze x 15  Prone: Knee flexion x 15 on R;  Seated:  LAQ 2 x10 on R,  Knee flexion/HSC RTB x 20;  sit to stand x 10;  Standing:   Stretches: Seated HSS 30 sec x 3 on R;  Gait:  Rollator walker 45 ft 6, SPC 45 ft x 6   Neuromuscular Re-education: Manual Therapy: seated and supine: knee and hip PROM for flexion    PATIENT EDUCATION:  Education details:  reviewed HEP   Person educated: Patient Education method: Programmer, multimedia, Demonstration, Tactile cues, Verbal cues, and Handouts Education comprehension: verbalized understanding, returned demonstration, verbal cues required, tactile cues required, and needs further education   HOME EXERCISE PROGRAM: Access Code: [email or web address] Access Code: [email or web address] Access Code: [email or web address] - s/p R hip fx    ASSESSMENT:  CLINICAL IMPRESSION: 01/14/2022 Pt with improving ability for transfers, ther ex, and gait. Still recommended use of RW, but pt did well with SPC for short distances today. Improving Hip and knee ROM, but still limited due  to pain.     OBJECTIVE IMPAIRMENTS Abnormal gait, decreased activity tolerance, decreased balance, decreased endurance, decreased knowledge of use of DME, decreased mobility, difficulty walking, decreased ROM, decreased strength, hypomobility, increased muscle spasms, impaired flexibility, and pain.   ACTIVITY LIMITATIONS cleaning, community activity, driving, meal prep, yard work, and shopping.   PERSONAL FACTORS  none  are also affecting patient's functional outcome.    REHAB POTENTIAL: Good  CLINICAL DECISION MAKING: Stable/uncomplicated  EVALUATION COMPLEXITY: Low   GOALS: Goals reviewed with patient? Yes  SHORT TERM GOALS: Target date: 01/15/22  Pt to be independent with initial HEP Goal status: INITIAL   .  Pt to report pain levels 0-3/10 in R hip with activity.  Goal status: INITIAL    LONG TERM GOALS: Target date: 02/12/2022  Pt to be independent with final HEP Goal status: INITIAL  2.  Pt to report decreased pain in R knee to 0-2/10 with activity  Goal status: INITIAL  3.  Pt to demo soft tissue limitations in R quad and thigh to be WNL, to improve pain and function.   Goal status: INITIAL  4.  Pt to demo independent ambulation for community distances, without pain in knee, thigh or hip , Goal status: INITIAL  5.  Pt to demo improved strength of R hip to at least 4/5 to improve stability, gait, and stairs.   Goal status: INITIAL       PLAN: PT FREQUENCY: 1-2x/week  PT DURATION: 6 weeks  PLANNED INTERVENTIONS: Therapeutic exercises, Therapeutic activity, Neuromuscular re-education, Balance training, Gait training, Patient/Family education, Joint mobilization, Stair training, DME instructions, Dry Needling, Electrical stimulation, Spinal manipulation, Spinal mobilization, Cryotherapy, Moist heat, Taping, Vasopneumatic device, Ultrasound, Ionotophoresis 4mg /ml Dexamethasone, and Manual therapy  PLAN FOR NEXT SESSION: Manual for R quad/ITB, needling as  needed, strength for R hip, ankle mobility/gait.   , PT, DPT 1:50 PM  01/14/22

## 2022-01-16 ENCOUNTER — Ambulatory Visit (INDEPENDENT_AMBULATORY_CARE_PROVIDER_SITE_OTHER): Payer: Medicare Other | Admitting: Physical Therapy

## 2022-01-16 ENCOUNTER — Encounter: Payer: Self-pay | Admitting: Physical Therapy

## 2022-01-16 DIAGNOSIS — M79651 Pain in right thigh: Secondary | ICD-10-CM | POA: Diagnosis not present

## 2022-01-16 DIAGNOSIS — M25671 Stiffness of right ankle, not elsewhere classified: Secondary | ICD-10-CM

## 2022-01-16 DIAGNOSIS — M25551 Pain in right hip: Secondary | ICD-10-CM | POA: Diagnosis not present

## 2022-01-16 DIAGNOSIS — M25571 Pain in right ankle and joints of right foot: Secondary | ICD-10-CM | POA: Diagnosis not present

## 2022-01-16 DIAGNOSIS — R2689 Other abnormalities of gait and mobility: Secondary | ICD-10-CM

## 2022-01-16 NOTE — Therapy (Signed)
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 01/12/2020   Arthritis    "right knee" (01/28/2016)   Atypical atrial flutter ([place]) 05/17/2019   Diverticulosis of colon (without mention of hemorrhage)    Dysrhythmia    Hyperlipidemia    Internal hemorrhoids without mention of complication    Personal history of colonic polyps 01/17/2002   adenomatous, tublar adenoma 02/10/2007   PNA (pneumonia) 03/31/2012   Past Surgical History:  Procedure Laterality Date   ATRIAL FIBRILLATION ABLATION  11/28/2016   ATRIAL FIBRILLATION ABLATION N/A 11/28/2016   Procedure: Atrial Fibrillation Ablation;  Surgeon: Constance Haw, MD;  Location: [place] [facility];  Service: Cardiovascular;  Laterality: N/A;   ATRIAL FIBRILLATION ABLATION N/A 03/18/2019   Procedure: ATRIAL FIBRILLATION ABLATION;  Surgeon: Constance Haw, MD;  Location: [place] [facility];  Service: Cardiovascular;  Laterality: N/A;   CARDIOVERSION  05/03/2012   Procedure: CARDIOVERSION;  Surgeon: Laverda Page, MD;  Location: [place];  Service: Cardiovascular;  Laterality: N/A;   CARDIOVERSION  06/08/2012   Procedure: CARDIOVERSION;  Surgeon: Laverda Page, MD;  Location: [place];  Service: Cardiovascular;  Laterality: N/A;  [name]   CARDIOVERSION N/A 10/28/2013   Procedure: CARDIOVERSION;  Surgeon: Laverda Page, MD;  Location: [place];  Service: Cardiovascular;  Laterality: N/A;   CARDIOVERSION N/A 06/30/2014   Procedure: CARDIOVERSION;  Surgeon: Laverda Page, MD;  Location: [place];  Service: Cardiovascular;  Laterality: N/A;  H&P in file   CARDIOVERSION N/A 09/05/2014   Procedure: CARDIOVERSION;  Surgeon: Adrian Prows, MD;  Location: [place];  Service: Cardiovascular;  Laterality: N/A;   CARDIOVERSION N/A 12/24/2015   Procedure: CARDIOVERSION;  Surgeon: Adrian Prows, MD;  Location: [place];  Service: Cardiovascular;  Laterality: N/A;   CARDIOVERSION N/A 01/30/2016   Procedure: CARDIOVERSION;  Surgeon: Adrian Prows, MD;  Location: [place];  Service: Cardiovascular;  Laterality: N/A;   CARDIOVERSION N/A 02/08/2016   Procedure: CARDIOVERSION;  Surgeon: Adrian Prows, MD;  Location: [place];  Service: Cardiovascular;  Laterality: N/A;   CARDIOVERSION N/A 02/08/2019   Procedure: CARDIOVERSION;  Surgeon: Adrian Prows, MD;  Location: [place];  Service: Cardiovascular;  Laterality: N/A;   CARDIOVERSION N/A 05/24/2019   Procedure: CARDIOVERSION;  Surgeon: Elouise Munroe, MD;  Location: [facility] ENDOSCOPY;  Service: Cardiovascular;  Laterality: N/A;   CARDIOVERSION N/A 09/21/2019   Procedure: CARDIOVERSION;  Surgeon: Adrian Prows, MD;  Location: [facility] ENDOSCOPY;  Service: Cardiovascular;  Laterality: N/A;   CARDIOVERSION N/A 12/01/2019   Procedure: CARDIOVERSION;  Surgeon: Adrian Prows, MD;  Location: [place];  Service: Cardiovascular;  Laterality: N/A;   CARDIOVERSION N/A 08/20/2021   Procedure: CARDIOVERSION;  Surgeon: Adrian Prows, MD;  Location: [place];  Service: Cardiovascular;  Laterality: N/A;  CARDIOVERSION N/A 10/18/2021   Procedure: CARDIOVERSION;  Surgeon: [name], MD;  Location: [facility] ENDOSCOPY;  Service: Cardiovascular;  Laterality: N/A;   CATARACT EXTRACTION W/ INTRAOCULAR LENS  IMPLANT, BILATERAL Bilateral    COLONOSCOPY     INGUINAL HERNIA REPAIR Bilateral    as child   INTRAMEDULLARY (IM) NAIL  INTERTROCHANTERIC Right 12/29/2021   Procedure: INTRAMEDULLARY (IM) NAIL INTERTROCHANTRIC;  Surgeon: Terance Hart, MD;  Location: [facility] OR;  Service: Orthopedics;  Laterality: Right;   KNEE ARTHROSCOPY Right 2004   Patient Active Problem List   Diagnosis Date Noted   Closed right hip fracture (HCC) 12/28/2021   HLD (hyperlipidemia) 12/28/2021   Change in bowel habits 12/05/2021   Generalized abdominal pain 12/05/2021   BPH with elevated PSA 12/13/2019   Dermatitis 12/13/2019   Encounter for Medicare annual wellness exam 12/13/2019   Chronic pain of right knee 10/10/2019   Acquired thrombophilia (HCC) 05/17/2019   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/06/2018   Primary osteoarthritis of right knee 08/06/2018   Pes anserinus bursitis of left knee 10/31/2016   Allergic rhinitis 02/14/2016   Hearing loss 02/13/2016   CKD (chronic kidney disease) stage 3, GFR 30-59 ml/min (HCC) 02/13/2016   Paroxysmal atrial fibrillation (HCC) 03/31/2012   Hypertension 03/31/2012   Hemorrhoids, external 05/15/2011    [facility]  REFERRING PROVIDER: Roe Coombs*, Dub Mikes (hip)  REFERRING DIAG: S/P R hip fracture/ IM repair 12/29/21,   S/P R TKE (uni)   THERAPY DIAG:  Pain in right hip  Pain in right thigh  Stiffness of right ankle, not elsewhere classified  Pain in right ankle and joints of right foot  Other abnormalities of gait and mobility  ONSET DATE:  Surgery date: 09/11/21 R Uni TKA   SUBJECTIVE:   SUBJECTIVE STATEMENT: Pt states improvements in pain and  mobility.   PERTINENT HISTORY: Chronic A-Fib,   PAIN:  Are you having pain? Yes: NPRS scale: 1/10 Pain location: R knee Pain description: achey Aggravating factors: increased activity  Relieving factors: Rest, ice   Are you having pain? Yes: NPRS scale: 5/10 Pain location: R hip Pain description: achey, sore  Aggravating factors: increased activity , movement   Relieving factors: Rest, ice    PRECAUTIONS: None  WEIGHT BEARING RESTRICTIONS No  FALLS:  Has patient fallen in last 6 months? Yes. Number of falls 1  PLOF: Independent  PATIENT GOALS  Decreased pain   OBJECTIVE:  updated 01/01/22   COGNITION:  Overall cognitive status: Within functional limits for tasks assessed      PALPATION: Mild soreness in proximal rec fem, prox and mid vastus lateralis, mid ITB,    LE ROM:  Hip ROM: Mild limitation  Passive ROM Right 12/12/21 R 01/01/22                                                      Knee flexion 120       Knee extension 0               Hip flexion  ~90 deg                       (Blank rows = not tested)   LE MMT:  R Knee: 4-/5,  R hip: 3-/5    TODAY'S  TREATMENT:  01/16/2022 Therapeutic Exercise: Aerobic:  Supine:  Heel slides x10 on  R, Quad sets x 20 bil;  SLR 3 x 5 bil;  Prone: Knee flexion x 15 on R;  Seated:  LAQ 2 x10 on R,  Knee flexion/HSC RTB x 20;  sit to stand x 10;  Standing:  Knee flexion x 10 on R; L/R weight shifts no UE support x 20;  Stretches: Seated HSS 30 sec x 3 on R; DF stretch at step 20 sec x 3 bil;  Gait:  Rollator walker 45 ft x 8,    Neuromuscular Re-education: Manual Therapy: seated and supine: knee and hip PROM for flexion    PATIENT EDUCATION:  Education details:  reviewed HEP   Person educated: Patient Education method: Programmer, multimedia, Demonstration, Tactile cues, Verbal cues, and Handouts Education comprehension: verbalized understanding, returned demonstration, verbal cues required, tactile cues required, and needs further education   HOME EXERCISE PROGRAM: Access Code: [email or web address] knee Access Code: [email or web address] Access Code: [email or web address] - s/p R hip fx    ASSESSMENT:  CLINICAL IMPRESSION: 01/16/2022 Pt with improving ability for ther ex, and movement, with less pain. He has ability for SLR today. Plan to progress activity as tolerated.   OBJECTIVE IMPAIRMENTS  Abnormal gait, decreased activity tolerance, decreased balance, decreased endurance, decreased knowledge of use of DME, decreased mobility, difficulty walking, decreased ROM, decreased strength, hypomobility, increased muscle spasms, impaired flexibility, and pain.   ACTIVITY LIMITATIONS cleaning, community activity, driving, meal prep, yard work, and shopping.   PERSONAL FACTORS  none  are also affecting patient's functional outcome.    REHAB POTENTIAL: Good  CLINICAL DECISION MAKING: Stable/uncomplicated  EVALUATION COMPLEXITY: Low   GOALS: Goals reviewed with patient? Yes  SHORT TERM GOALS: Target date: 01/15/22  Pt to be independent with initial HEP Goal status: INITIAL   .  Pt to report pain levels 0-3/10 in R hip with activity.  Goal status: INITIAL    LONG TERM GOALS: Target date: 02/12/2022  Pt to be independent with final HEP Goal status: INITIAL  2.  Pt to report decreased pain in R knee to 0-2/10 with activity  Goal status: INITIAL  3.  Pt to demo soft tissue limitations in R quad and thigh to be WNL, to improve pain and function.   Goal status: INITIAL  4.  Pt to demo independent ambulation for community distances, without pain in knee, thigh or hip , Goal status: INITIAL  5.  Pt to demo improved strength of R hip to at least 4/5 to improve stability, gait, and stairs.   Goal status: INITIAL       PLAN: PT FREQUENCY: 1-2x/week  PT DURATION: 6 weeks  PLANNED INTERVENTIONS: Therapeutic exercises, Therapeutic activity, Neuromuscular re-education, Balance training, Gait training, Patient/Family education, Joint mobilization, Stair training, DME instructions, Dry Needling, Electrical stimulation, Spinal manipulation, Spinal mobilization, Cryotherapy, Moist heat, Taping, Vasopneumatic device, Ultrasound, Ionotophoresis 4mg /ml Dexamethasone, and Manual therapy  PLAN FOR NEXT SESSION: Manual for R quad/ITB, needling as needed, strength for R hip, ankle  mobility/gait.   , PT, DPT 11:47 AM  01/16/22

## 2022-01-20 ENCOUNTER — Ambulatory Visit (INDEPENDENT_AMBULATORY_CARE_PROVIDER_SITE_OTHER): Payer: Medicare Other | Admitting: Physical Therapy

## 2022-01-20 ENCOUNTER — Encounter: Payer: Self-pay | Admitting: Physical Therapy

## 2022-01-20 DIAGNOSIS — M79651 Pain in right thigh: Secondary | ICD-10-CM | POA: Diagnosis not present

## 2022-01-20 DIAGNOSIS — R2689 Other abnormalities of gait and mobility: Secondary | ICD-10-CM

## 2022-01-20 DIAGNOSIS — M25551 Pain in right hip: Secondary | ICD-10-CM | POA: Diagnosis not present

## 2022-01-20 DIAGNOSIS — M25671 Stiffness of right ankle, not elsewhere classified: Secondary | ICD-10-CM | POA: Diagnosis not present

## 2022-01-20 DIAGNOSIS — M25571 Pain in right ankle and joints of right foot: Secondary | ICD-10-CM | POA: Diagnosis not present

## 2022-01-20 NOTE — Therapy (Signed)
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 01/12/2020   Arthritis    "right knee" (01/28/2016)   Atypical atrial flutter (HCC) 05/17/2019   Diverticulosis of colon (without mention of hemorrhage)    Dysrhythmia    Hyperlipidemia    Internal hemorrhoids without mention of complication    Personal history of colonic polyps 01/17/2002   adenomatous, tublar adenoma 02/10/2007   PNA (pneumonia) 03/31/2012   Past Surgical History:  Procedure Laterality Date   ATRIAL FIBRILLATION ABLATION  11/28/2016   ATRIAL FIBRILLATION ABLATION N/A 11/28/2016   Procedure: Atrial Fibrillation Ablation;  Surgeon: Regan Lemming, MD;  Location: [facility];  Service: Cardiovascular;  Laterality: N/A;   ATRIAL FIBRILLATION ABLATION N/A 03/18/2019   Procedure: ATRIAL FIBRILLATION ABLATION;  Surgeon: Regan Lemming, MD;  Location: [facility];  Service: Cardiovascular;  Laterality: N/A;   CARDIOVERSION  05/03/2012   Procedure: CARDIOVERSION;  Surgeon: [name], MD;  Location: [facility] ENDOSCOPY;  Service: Cardiovascular;  Laterality: N/A;   CARDIOVERSION  06/08/2012   Procedure: CARDIOVERSION;  Surgeon: [name], MD;  Location: [facility] ENDOSCOPY;  Service: Cardiovascular;  Laterality: N/A;  [place]se[place] Wille Celeste   CARDIOVERSION N/A 10/28/2013   Procedure: CARDIOVERSION;  Surgeon: [name], MD;  Location: [facility]  ENDOSCOPY;  Service: Cardiovascular;  Laterality: N/A;   CARDIOVERSION N/A 06/30/2014   Procedure: CARDIOVERSION;  Surgeon: [name], MD;  Location: [facility] ENDOSCOPY;  Service: Cardiovascular;  Laterality: N/A;  H&P in file   CARDIOVERSION N/A 09/05/2014   Procedure: CARDIOVERSION;  Surgeon: [name], MD;  Location: [facility] ENDOSCOPY;  Service: Cardiovascular;  Laterality: N/A;   CARDIOVERSION N/A 12/24/2015   Procedure: CARDIOVERSION;  Surgeon: [name], MD;  Location: [facility] ENDOSCOPY;  Service: Cardiovascular;  Laterality: N/A;   CARDIOVERSION N/A 01/30/2016   Procedure: CARDIOVERSION;  Surgeon: [name], MD;  Location: [facility] ENDOSCOPY;  Service: Cardiovascular;  Laterality: N/A;   CARDIOVERSION N/A 02/08/2016   Procedure: CARDIOVERSION;  Surgeon: [name], MD;  Location: [facility] ENDOSCOPY;  Service: Cardiovascular;  Laterality: N/A;   CARDIOVERSION N/A 02/08/2019   Procedure: CARDIOVERSION;  Surgeon: [name], MD;  Location: [facility] ENDOSCOPY;  Service: Cardiovascular;  Laterality: N/A;   CARDIOVERSION N/A 05/24/2019   Procedure: CARDIOVERSION;  Surgeon: Parke Poisson, MD;  Location: [facility] ENDOSCOPY;  Service: Cardiovascular;  Laterality: N/A;   CARDIOVERSION N/A 09/21/2019   Procedure: CARDIOVERSION;  Surgeon: [name], MD;  Location: [facility] ENDOSCOPY;  Service: Cardiovascular;  Laterality: N/A;   CARDIOVERSION N/A 12/01/2019   Procedure: CARDIOVERSION;  Surgeon: [name], MD;  Location: [facility] ENDOSCOPY;  Service: Cardiovascular;  Laterality: N/A;   CARDIOVERSION N/A 08/20/2021   Procedure: CARDIOVERSION;  Surgeon: [name], MD;  Location: [facility] ENDOSCOPY;  Service: Cardiovascular;  Laterality:  N/A;   CARDIOVERSION N/A 10/18/2021   Procedure: CARDIOVERSION;  Surgeon: Adrian Prows, MD;  Location: [place];  Service: Cardiovascular;  Laterality: N/A;   CATARACT EXTRACTION W/ INTRAOCULAR LENS  IMPLANT, BILATERAL Bilateral    COLONOSCOPY     INGUINAL HERNIA REPAIR Bilateral    as child   INTRAMEDULLARY (IM) NAIL  INTERTROCHANTERIC Right 12/29/2021   Procedure: INTRAMEDULLARY (IM) NAIL INTERTROCHANTRIC;  Surgeon: Erle Crocker, MD;  Location: [place];  Service: Orthopedics;  Laterality: Right;   KNEE ARTHROSCOPY Right 2004   Patient Active Problem List   Diagnosis Date Noted   Closed right hip fracture ([place]) 12/28/2021   HLD (hyperlipidemia) 12/28/2021   Change in bowel habits 12/05/2021   Generalized abdominal pain 12/05/2021   BPH with elevated PSA 12/13/2019   Dermatitis 12/13/2019   Encounter for Medicare annual wellness exam 12/13/2019   Chronic pain of right knee 10/10/2019   Acquired thrombophilia ([place]) 05/17/2019   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/06/2018   Primary osteoarthritis of right knee 08/06/2018   Pes anserinus bursitis of left knee 10/31/2016   Allergic rhinitis 02/14/2016   Hearing loss 02/13/2016   CKD (chronic kidney disease) stage 3, GFR 30-59 ml/min (HCC) 02/13/2016   Paroxysmal atrial fibrillation ([place]) 03/31/2012   Hypertension 03/31/2012   Hemorrhoids, external 05/15/2011    PCP: [place], [place] PROVIDER: Josefa Half*, Melony Overly (hip)  REFERRING DIAG: S/P R hip fracture/ IM repair 12/29/21,   S/P R TKE (uni)   THERAPY DIAG:  Pain in right hip  Pain in right thigh  Stiffness of right ankle, not elsewhere classified  Pain in right ankle and joints of right foot  Other abnormalities of gait and mobility  ONSET DATE:  Surgery date: 09/11/21 R Uni TKA   SUBJECTIVE:   SUBJECTIVE STATEMENT: Pt states improvements in pain and  mobility. Has been using cane at home more.   PERTINENT HISTORY: Chronic A-Fib,   PAIN:  Are you having pain? Yes: NPRS scale: 1/10 Pain location: R knee Pain description: achey Aggravating factors: increased activity  Relieving factors: Rest, ice   Are you having pain? Yes: NPRS scale: 5/10 Pain location: R hip Pain description: achey, sore  Aggravating factors:  increased activity , movement  Relieving factors: Rest, ice    PRECAUTIONS: None  WEIGHT BEARING RESTRICTIONS No  FALLS:  Has patient fallen in last 6 months? Yes. Number of falls 1  PLOF: Independent  PATIENT GOALS  Decreased pain   OBJECTIVE:  updated 01/01/22   COGNITION:  Overall cognitive status: Within functional limits for tasks assessed      PALPATION: Mild soreness in proximal rec fem, prox and mid vastus lateralis, mid ITB,    LE ROM:  Hip ROM: Mild limitation  Passive ROM Right 12/12/21 R 01/01/22                                                      Knee flexion 120       Knee extension 0               Hip flexion  ~90 deg                       (Blank rows = not tested)   LE MMT:  R  Knee: 4-/5,  R hip: 3-/5    TODAY'S TREATMENT:  01/20/2022 Therapeutic Exercise: Aerobic:  Supine:  Heel slides x10 on R,   SLR 3 x 5 bil;  Prone:  Seated:  LAQ 2 x10 bil, 3lb;    sit to stand x 10; seated trunk/hip flexion x 10;  Standing:  Marching x 25;  HR x 20;  Stretches:  Gait:  Rollator walker 45 ft x 8,  SPC 45 ft x 8 with education on mechanics .  Neuromuscular Re-education: Manual Therapy: supine: knee and hip PROM for flexion    PATIENT EDUCATION:  Education details:  reviewed HEP   Person educated: Patient Education method: Explanation, Demonstration, Tactile cues, Verbal cues, and Handouts Education comprehension: verbalized understanding, returned demonstration, verbal cues required, tactile cues required, and needs further education   HOME EXERCISE PROGRAM: Access Code: [email or web address] knee Access Code: [email or web address] Access Code: [email or web address] - s/p R hip fx    ASSESSMENT:  CLINICAL IMPRESSION: 01/20/2022 Pt with improving ROM for hip today. He seems to have less ROM in R knee than he did when we ended rehab for his knee but it is improving, up to 105 deg today. Pt overall with improving tolerance for standing, walking, and strengthening with  less pain. Pt safe for using SPC for shorter and flat surfaces, pain allowing. Plan to progress as tolerated.   OBJECTIVE IMPAIRMENTS Abnormal gait, decreased activity tolerance, decreased balance, decreased endurance, decreased knowledge of use of DME, decreased mobility, difficulty walking, decreased ROM, decreased strength, hypomobility, increased muscle spasms, impaired flexibility, and pain.   ACTIVITY LIMITATIONS cleaning, community activity, driving, meal prep, yard work, and shopping.   PERSONAL FACTORS  none  are also affecting patient's functional outcome.    REHAB POTENTIAL: Good  CLINICAL DECISION MAKING: Stable/uncomplicated  EVALUATION COMPLEXITY: Low   GOALS: Goals reviewed with patient? Yes  SHORT TERM GOALS: Target date: 01/15/22  Pt to be independent with initial HEP Goal status: INITIAL   .  Pt to report pain levels 0-3/10 in R hip with activity.  Goal status: INITIAL    LONG TERM GOALS: Target date: 02/12/2022  Pt to be independent with final HEP Goal status: INITIAL  2.  Pt to report decreased pain in R knee to 0-2/10 with activity  Goal status: INITIAL  3.  Pt to demo soft tissue limitations in R quad and thigh to be WNL, to improve pain and function.   Goal status: INITIAL  4.  Pt to demo independent ambulation for community distances, without pain in knee, thigh or hip , Goal status: INITIAL  5.  Pt to demo improved strength of R hip to at least 4/5 to improve stability, gait, and stairs.   Goal status: INITIAL       PLAN: PT FREQUENCY: 1-2x/week  PT DURATION: 6 weeks  PLANNED INTERVENTIONS: Therapeutic exercises, Therapeutic activity, Neuromuscular re-education, Balance training, Gait training, Patient/Family education, Joint mobilization, Stair training, DME instructions, Dry Needling, Electrical stimulation, Spinal manipulation, Spinal mobilization, Cryotherapy, Moist heat, Taping, Vasopneumatic device, Ultrasound, Ionotophoresis 4mg /ml  Dexamethasone, and Manual therapy  PLAN FOR NEXT SESSION: Manual for R quad/ITB, needling as needed, strength for R hip, ankle mobility/gait.   , PT, DPT 3:17 PM  01/20/22

## 2022-01-22 ENCOUNTER — Other Ambulatory Visit: Payer: Self-pay | Admitting: Cardiology

## 2022-01-22 DIAGNOSIS — N1832 Chronic kidney disease, stage 3b: Secondary | ICD-10-CM

## 2022-01-23 ENCOUNTER — Ambulatory Visit (INDEPENDENT_AMBULATORY_CARE_PROVIDER_SITE_OTHER): Payer: Medicare Other | Admitting: Physical Therapy

## 2022-01-23 ENCOUNTER — Encounter: Payer: Self-pay | Admitting: Physical Therapy

## 2022-01-23 DIAGNOSIS — M25551 Pain in right hip: Secondary | ICD-10-CM

## 2022-01-23 DIAGNOSIS — M79651 Pain in right thigh: Secondary | ICD-10-CM | POA: Diagnosis not present

## 2022-01-23 DIAGNOSIS — R2689 Other abnormalities of gait and mobility: Secondary | ICD-10-CM

## 2022-01-23 DIAGNOSIS — M25671 Stiffness of right ankle, not elsewhere classified: Secondary | ICD-10-CM

## 2022-01-23 DIAGNOSIS — M25571 Pain in right ankle and joints of right foot: Secondary | ICD-10-CM

## 2022-01-23 NOTE — Therapy (Signed)
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 01/12/2020   Arthritis    "right knee" (01/28/2016)   Atypical atrial flutter ([place]) 05/17/2019   Diverticulosis of colon (without mention of hemorrhage)    Dysrhythmia    Hyperlipidemia    Internal hemorrhoids without mention of complication    Personal history of colonic polyps 01/17/2002   adenomatous, tublar adenoma 02/10/2007   PNA (pneumonia) 03/31/2012   Past Surgical History:  Procedure Laterality Date   ATRIAL FIBRILLATION ABLATION  11/28/2016   ATRIAL FIBRILLATION ABLATION N/A 11/28/2016   Procedure: Atrial Fibrillation Ablation;  Surgeon: Constance Haw, MD;  Location: [place] CV LAB;  Service: Cardiovascular;  Laterality: N/A;   ATRIAL FIBRILLATION ABLATION N/A 03/18/2019   Procedure: ATRIAL FIBRILLATION ABLATION;  Surgeon: Constance Haw, MD;  Location: [place] CV [facility];  Service: Cardiovascular;  Laterality: N/A;   CARDIOVERSION  05/03/2012   Procedure: CARDIOVERSION;  Surgeon: Laverda Page, MD;  Location: [place];  Service: Cardiovascular;  Laterality: N/A;   CARDIOVERSION  06/08/2012   Procedure: CARDIOVERSION;  Surgeon: Laverda Page, MD;  Location: [place];  Service: Cardiovascular;  Laterality: N/A;  [name]   CARDIOVERSION N/A 10/28/2013   Procedure: CARDIOVERSION;  Surgeon: Laverda Page, MD;  Location: [place];  Service: Cardiovascular;  Laterality: N/A;   CARDIOVERSION N/A 06/30/2014   Procedure: CARDIOVERSION;  Surgeon: Laverda Page, MD;  Location: [place];  Service: Cardiovascular;  Laterality: N/A;  H&P in file   CARDIOVERSION N/A 09/05/2014   Procedure: CARDIOVERSION;  Surgeon: Adrian Prows, MD;  Location: [place];  Service: Cardiovascular;  Laterality: N/A;   CARDIOVERSION N/A 12/24/2015   Procedure: CARDIOVERSION;  Surgeon: Adrian Prows, MD;  Location: [place];  Service: Cardiovascular;  Laterality: N/A;   CARDIOVERSION N/A 01/30/2016   Procedure: CARDIOVERSION;  Surgeon: Adrian Prows, MD;  Location: [place];  Service: Cardiovascular;  Laterality: N/A;   CARDIOVERSION N/A 02/08/2016   Procedure: CARDIOVERSION;  Surgeon: Adrian Prows, MD;  Location: [place];  Service: Cardiovascular;  Laterality: N/A;   CARDIOVERSION N/A 02/08/2019   Procedure: CARDIOVERSION;  Surgeon: Adrian Prows, MD;  Location: [place];  Service: Cardiovascular;  Laterality: N/A;   CARDIOVERSION N/A 05/24/2019   Procedure: CARDIOVERSION;  Surgeon: Elouise Munroe, MD;  Location: [facility] ENDOSCOPY;  Service: Cardiovascular;  Laterality: N/A;   CARDIOVERSION N/A 09/21/2019   Procedure: CARDIOVERSION;  Surgeon: Adrian Prows, MD;  Location: [facility] ENDOSCOPY;  Service: Cardiovascular;  Laterality: N/A;   CARDIOVERSION N/A 12/01/2019   Procedure: CARDIOVERSION;  Surgeon: Adrian Prows, MD;  Location: [place];  Service: Cardiovascular;  Laterality: N/A;   CARDIOVERSION N/A 08/20/2021   Procedure: CARDIOVERSION;  Surgeon: Adrian Prows, MD;  Location: [place];  Service: Cardiovascular;  Laterality: N/A;   CARDIOVERSION N/A 10/18/2021   Procedure: CARDIOVERSION;  Surgeon: [name], MD;  Location: [facility] ENDOSCOPY;  Service: Cardiovascular;  Laterality: N/A;   CATARACT EXTRACTION W/ INTRAOCULAR LENS  IMPLANT, BILATERAL Bilateral    COLONOSCOPY     INGUINAL HERNIA REPAIR Bilateral    as child   INTRAMEDULLARY (IM) NAIL  INTERTROCHANTERIC Right 12/29/2021   Procedure: INTRAMEDULLARY (IM) NAIL INTERTROCHANTRIC;  Surgeon: Terance Hart, MD;  Location: [facility] OR;  Service: Orthopedics;  Laterality: Right;   KNEE ARTHROSCOPY Right 2004   Patient Active Problem List   Diagnosis Date Noted   Closed right hip fracture (HCC) 12/28/2021   HLD (hyperlipidemia) 12/28/2021   Change in bowel habits 12/05/2021   Generalized abdominal pain 12/05/2021   BPH with elevated PSA 12/13/2019   Dermatitis 12/13/2019   Encounter for Medicare annual wellness exam 12/13/2019   Chronic pain of right knee 10/10/2019   Acquired thrombophilia (HCC) 05/17/2019   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/06/2018   Primary osteoarthritis of right knee 08/06/2018   Pes anserinus bursitis of left knee 10/31/2016   Allergic rhinitis 02/14/2016   Hearing loss 02/13/2016   CKD (chronic kidney disease) stage 3, GFR 30-59 ml/min (HCC) 02/13/2016   Paroxysmal atrial fibrillation (HCC) 03/31/2012   Hypertension 03/31/2012   Hemorrhoids, external 05/15/2011    [facility]  REFERRING PROVIDER: Roe Coombs*, Dub Mikes (hip)  REFERRING DIAG: S/P R hip fracture/ IM repair 12/29/21,   S/P R TKE (uni)   THERAPY DIAG:  Pain in right hip  Pain in right thigh  Stiffness of right ankle, not elsewhere classified  Pain in right ankle and joints of right foot  Other abnormalities of gait and mobility  ONSET DATE:  Surgery date: 09/11/21 R Uni TKA   SUBJECTIVE:   SUBJECTIVE STATEMENT: Pt  Has been using cane at home more. Pain around 2/10 this week. States ankle is more swollen with some bruising this week.   PERTINENT HISTORY: Chronic A-Fib,   PAIN:  Are you having pain? Yes: NPRS scale: 2/10 Pain location: R knee Pain description: achey Aggravating factors: increased activity  Relieving factors: Rest, ice   Are you having pain? Yes: NPRS scale: 5/10 Pain location: R hip Pain  description: achey, sore  Aggravating factors: increased activity , movement  Relieving factors: Rest, ice    PRECAUTIONS: None  WEIGHT BEARING RESTRICTIONS No  FALLS:  Has patient fallen in last 6 months? Yes. Number of falls 1  PLOF: Independent  PATIENT GOALS  Decreased pain   OBJECTIVE:  updated 01/01/22   COGNITION:  Overall cognitive status: Within functional limits for tasks assessed      PALPATION: Mild soreness in proximal rec fem, prox and mid vastus lateralis, mid ITB,    LE ROM:  Hip ROM: Mild limitation  Passive ROM Right 12/12/21 R 01/01/22                                                      Knee flexion 120       Knee extension 0               Hip flexion  ~90 deg                       (Blank  rows = not tested)   LE MMT:  R Knee: 4-/5,  R hip: 3-/5    TODAY'S TREATMENT:  01/23/22:  Therapeutic Exercise: Aerobic: Recumbent bike L1 x 5 min Supine:   Prone:  Seated:  Standing:  HR x 15; mini squats x 20; Staggered stance weight shifts x 15 bil;  AirEx: L/R and A/P weight shifts;  Stretches:  Gait:    SPC 45 ft x 8 with education on mechanics . Neuromuscular Re-education: Manual Therapy: supine: knee and hip PROM for flexion    01/20/2022 Therapeutic Exercise: Aerobic:  Supine:  Heel slides x10 on R,   SLR 3 x 5 bil;  Prone:  Seated:  LAQ 2 x10 bil, 3lb;    sit to stand x 10; seated trunk/hip flexion x 10;  Standing:  Marching x 25;  HR x 20;  Stretches:  Gait:  Rollator walker 45 ft x 8,  SPC 45 ft x 8 with education on mechanics .  Neuromuscular Re-education: Manual Therapy: supine: knee and hip PROM for flexion    PATIENT EDUCATION:  Education details:  reviewed HEP   Person educated: Patient Education method: Explanation, Demonstration, Tactile cues, Verbal cues, and Handouts Education comprehension: verbalized understanding, returned demonstration, verbal cues required, tactile cues required, and needs further  education   HOME EXERCISE PROGRAM: Access Code: [email or web address] Access Code: [email or web address] Access Code: [email or web address] - s/p R hip fx    ASSESSMENT:  CLINICAL IMPRESSION: 01/23/2022 Pt with improved ability and tolerance for standing activity today. Challenged with weight shifts, will benefit from continued practice, to improve stability with gait. Discussed elevating feet/lower legs in afternoons, daily, and wearing compression sock for a few days in attempts to help swelling in ankle. He does have more bruising in ankle today, less bruising and swelling in thigh, but more down further in ankle today from last week.   OBJECTIVE IMPAIRMENTS Abnormal gait, decreased activity tolerance, decreased balance, decreased endurance, decreased knowledge of use of DME, decreased mobility, difficulty walking, decreased ROM, decreased strength, hypomobility, increased muscle spasms, impaired flexibility, and pain.   ACTIVITY LIMITATIONS cleaning, community activity, driving, meal prep, yard work, and shopping.   PERSONAL FACTORS  none  are also affecting patient's functional outcome.    REHAB POTENTIAL: Good  CLINICAL DECISION MAKING: Stable/uncomplicated  EVALUATION COMPLEXITY: Low   GOALS: Goals reviewed with patient? Yes  SHORT TERM GOALS: Target date: 01/15/22  Pt to be independent with initial HEP Goal status: INITIAL   .  Pt to report pain levels 0-3/10 in R hip with activity.  Goal status: INITIAL    LONG TERM GOALS: Target date: 02/12/2022  Pt to be independent with final HEP Goal status: INITIAL  2.  Pt to report decreased pain in R knee to 0-2/10 with activity  Goal status: INITIAL  3.  Pt to demo soft tissue limitations in R quad and thigh to be WNL, to improve pain and function.   Goal status: INITIAL  4.  Pt to demo independent ambulation for community distances, without pain in knee, thigh or hip , Goal status: INITIAL  5.  Pt to demo improved strength of R hip to at  least 4/5 to improve stability, gait, and stairs.   Goal status: INITIAL       PLAN: PT FREQUENCY: 1-2x/week  PT DURATION: 6 weeks  PLANNED INTERVENTIONS: Therapeutic exercises, Therapeutic activity, Neuromuscular re-education, Balance training, Gait training, Patient/Family education, Joint mobilization, Stair training, DME instructions, Dry Needling, Electrical stimulation, Spinal  manipulation, Spinal mobilization, Cryotherapy, Moist heat, Taping, Vasopneumatic device, Ultrasound, Ionotophoresis 4mg /ml Dexamethasone, and Manual therapy  PLAN FOR NEXT SESSION: Manual for R quad/ITB, needling as needed, strength for R hip, ankle mobility/gait.   , PT, DPT 12:07 PM  01/23/22

## 2022-01-27 ENCOUNTER — Other Ambulatory Visit: Payer: Self-pay | Admitting: Cardiology

## 2022-01-27 DIAGNOSIS — I480 Paroxysmal atrial fibrillation: Secondary | ICD-10-CM

## 2022-01-27 NOTE — Therapy (Signed)
[facility] [address] [place], [place], [address] Phone: [phone]   Fax:  [phone]  Patient Details  Name: Gregory [name] MRN: [id number] Date of Birth: 01/15/1941 Referring Provider:  Roe Coombs*  Encounter Date: 01/23/2022   To whom it may concern:   Mr. Bouwens sustained a hip fracture injury on 12/28/21, which required surgery on 7/16. He has been referred for Physical Therapy, and is being seen in our office. Due to surgery, surgical precautions, and healing time, it is expected that he will be attending Physical therapy and have limited mobility from 01/01/22 (evaluation date) for at least 8-12 weeks, a date of 03/26/22.   Thank you   Sedalia Muta, PT 01/27/2022, 11:55 AM  [facility] [address] [place], [place], [address] Phone: [phone]   Fax:  [phone]

## 2022-01-28 ENCOUNTER — Ambulatory Visit (INDEPENDENT_AMBULATORY_CARE_PROVIDER_SITE_OTHER): Payer: Medicare Other | Admitting: Physical Therapy

## 2022-01-28 ENCOUNTER — Encounter: Payer: Self-pay | Admitting: Physical Therapy

## 2022-01-28 DIAGNOSIS — M79651 Pain in right thigh: Secondary | ICD-10-CM

## 2022-01-28 DIAGNOSIS — M25551 Pain in right hip: Secondary | ICD-10-CM

## 2022-01-28 DIAGNOSIS — M25571 Pain in right ankle and joints of right foot: Secondary | ICD-10-CM | POA: Diagnosis not present

## 2022-01-28 DIAGNOSIS — R2689 Other abnormalities of gait and mobility: Secondary | ICD-10-CM

## 2022-01-28 DIAGNOSIS — M25671 Stiffness of right ankle, not elsewhere classified: Secondary | ICD-10-CM | POA: Diagnosis not present

## 2022-01-28 NOTE — Therapy (Signed)
OUTPATIENT PHYSICAL THERAPY TREATMENT   Patient Name: SYLAS TWOMBLY MRN: [id number] DOB:Sep 27, 1940, 81 y.o., male Today's Date:01/28/2022    PT End of Session - 01/28/22 1112     Visit Number 10    Number of Visits 16    Date for PT Re-Evaluation 02/12/22    Authorization Type Medicare,  Re-eval at visit 3.    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 01/12/2020   Arthritis    "right knee" (01/28/2016)   Atypical atrial flutter (HCC) 05/17/2019   Diverticulosis of colon (without mention of hemorrhage)    Dysrhythmia    Hyperlipidemia    Internal hemorrhoids without mention of complication    Personal history of colonic polyps 01/17/2002   adenomatous, tublar adenoma 02/10/2007   PNA (pneumonia) 03/31/2012   Past Surgical History:  Procedure Laterality Date   ATRIAL FIBRILLATION ABLATION  11/28/2016   ATRIAL FIBRILLATION ABLATION N/A 11/28/2016   Procedure: Atrial Fibrillation Ablation;  Surgeon: Regan Lemming, MD;  [facility]: [facility]ASIVE [facility];  Service: Cardiovascular;  Laterality: N/A;   ATRIAL FIBRILLATION ABLATION N/A 03/18/2019   Procedure: ATRIAL FIBRILLATION ABLATION;  Surgeon: Regan Lemming, MD;  Location: [facility];  Service: Cardiovascular;  Laterality: N/A;   CARDIOVERSION  05/03/2012   Procedure: CARDIOVERSION;  Surgeon: [name], MD;  Location: [facility] ENDOSCOPY;  Service: Cardiovascular;  Laterality: N/A;   CARDIOVERSION  06/08/2012   Procedure: CARDIOVERSION;  Surgeon: [name], MD;  Location: [facility] ENDOSCOPY;  Service: Cardiovascular;  Laterality: N/A;  Lawrence Marse[place] Wille Celeste   CARDIOVERSION N/A 10/28/2013   Procedure: CARDIOVERSION;  Surgeon: [name], MD;  Location: [facility] ENDOSCOPY;  Service: Cardiovascular;  Laterality: N/A;   CARDIOVERSION N/A 06/30/2014   Procedure: CARDIOVERSION;  Surgeon: [name], MD;  Location: [facility] ENDOSCOPY;  Service: Cardiovascular;  Laterality: N/A;  H&P in file   CARDIOVERSION N/A 09/05/2014   Procedure: CARDIOVERSION;  Surgeon: [name], MD;  Location: [facility] ENDOSCOPY;  Service: Cardiovascular;  Laterality: N/A;   CARDIOVERSION N/A 12/24/2015   Procedure: CARDIOVERSION;  Surgeon: [name], MD;  Location: [facility] ENDOSCOPY;  Service: Cardiovascular;  Laterality: N/A;   CARDIOVERSION N/A 01/30/2016   Procedure: CARDIOVERSION;  Surgeon: [name], MD;  Location: [facility] ENDOSCOPY;  Service: Cardiovascular;  Laterality: N/A;   CARDIOVERSION N/A 02/08/2016   Procedure: CARDIOVERSION;  Surgeon: [name], MD;  Location: [facility] ENDOSCOPY;  Service: Cardiovascular;  Laterality: N/A;   CARDIOVERSION N/A 02/08/2019   Procedure: CARDIOVERSION;  Surgeon: [name], MD;  Location: [facility] ENDOSCOPY;  Service: Cardiovascular;  Laterality: N/A;   CARDIOVERSION N/A 05/24/2019   Procedure: CARDIOVERSION;  Surgeon: Parke Poisson, MD;  Location: [facility] ENDOSCOPY;  Service: Cardiovascular;  Laterality: N/A;   CARDIOVERSION N/A 09/21/2019   Procedure: CARDIOVERSION;  Surgeon: [name], MD;  Location: [facility] ENDOSCOPY;  Service: Cardiovascular;  Laterality: N/A;   CARDIOVERSION N/A 12/01/2019   Procedure: CARDIOVERSION;  Surgeon: [name], MD;  Location: [facility] ENDOSCOPY;  Service: Cardiovascular;  Laterality: N/A;   CARDIOVERSION N/A 08/20/2021   Procedure: CARDIOVERSION;  Surgeon: [name], MD;  Location:  [facility] ENDOSCOPY;  Service: Cardiovascular;  Laterality: N/A;   CARDIOVERSION N/A 10/18/2021   Procedure: CARDIOVERSION;  Surgeon: [name], MD;  Location: [facility] ENDOSCOPY;  Service: Cardiovascular;  Laterality: N/A;   CATARACT EXTRACTION W/ INTRAOCULAR LENS  IMPLANT, BILATERAL Bilateral    COLONOSCOPY     INGUINAL HERNIA REPAIR Bilateral    as child    INTRAMEDULLARY (IM) NAIL INTERTROCHANTERIC Right 12/29/2021   Procedure: INTRAMEDULLARY (IM) NAIL INTERTROCHANTRIC;  Surgeon: Terance Hart, MD;  Location: [facility] OR;  Service: Orthopedics;  Laterality: Right;   KNEE ARTHROSCOPY Right 2004   Patient Active Problem List   Diagnosis Date Noted   Closed right hip fracture (HCC) 12/28/2021   HLD (hyperlipidemia) 12/28/2021   Change in bowel habits 12/05/2021   Generalized abdominal pain 12/05/2021   BPH with elevated PSA 12/13/2019   Dermatitis 12/13/2019   Encounter for Medicare annual wellness exam 12/13/2019   Chronic pain of right knee 10/10/2019   Acquired thrombophilia (HCC) 05/17/2019   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/06/2018   Primary osteoarthritis of right knee 08/06/2018   Pes anserinus bursitis of left knee 10/31/2016   Allergic rhinitis 02/14/2016   Hearing loss 02/13/2016   CKD (chronic kidney disease) stage 3, GFR 30-59 ml/min (HCC) 02/13/2016   Paroxysmal atrial fibrillation (HCC) 03/31/2012   Hypertension 03/31/2012   Hemorrhoids, external 05/15/2011    [facility]  REFERRING PROVIDER: Roe Coombs*, Dub Mikes (hip)  REFERRING DIAG: S/P R hip fracture/ IM repair 12/29/21,   S/P R TKE (uni)   THERAPY DIAG:  Pain in right hip  Pain in right thigh  Stiffness of right ankle, not elsewhere classified  Pain in right ankle and joints of right foot  Other abnormalities of gait and mobility  ONSET DATE:  Surgery date: 09/11/21 R Uni TKA   SUBJECTIVE:   SUBJECTIVE STATEMENT: Pt  Has been using cane at home more. States doing well. Lateral thigh gets sore at times.   PERTINENT HISTORY: Chronic A-Fib,   PAIN:  Are you having pain? Yes: NPRS scale: 2/10 Pain location: R knee Pain description: achey Aggravating factors: increased activity  Relieving factors: Rest, ice   Are you having pain? Yes: NPRS scale: 5/10 Pain location: R hip Pain description:  achey, sore  Aggravating factors: increased activity , movement  Relieving factors: Rest, ice    PRECAUTIONS: None  WEIGHT BEARING RESTRICTIONS No  FALLS:  Has patient fallen in last 6 months? Yes. Number of falls 1  PLOF: Independent  PATIENT GOALS  Decreased pain   OBJECTIVE:  updated 01/01/22   COGNITION:  Overall cognitive status: Within functional limits for tasks assessed      PALPATION: Mild soreness in proximal rec fem, prox and mid vastus lateralis, mid ITB,    LE ROM:  Hip ROM: Mild limitation  Passive ROM Right 12/12/21 R 01/01/22                                                      Knee flexion 120       Knee extension 0               Hip flexion  ~90 deg                       (Blank  rows = not tested)   LE MMT:  R Knee: 4-/5,  R hip: 3-/5    TODAY'S TREATMENT:  01/28/22:  Therapeutic Exercise: Aerobic: Recumbent bike L1 x 6 min Supine:   Bridging 2 x 10;  Prone:  Seated: LAQ x 15 bil;  Standing:   Staggered stance weight shifts x 15 bil;  Stretches: hip flexor off side of bed x 1 min; Hip ER fallouts x 15;  Gait:    SPC 45 ft x 8 Neuromuscular Re-education: Manual Therapy: supine: knee and hip PROM for flexion, Assist with hip flexor stretch for hip extension;  Ankle mobs to inc EV and DF; Tibial mobs to inc med rotation;      PATIENT EDUCATION:  Education details:  reviewed HEP   Person educated: Patient Education method: Explanation, Demonstration, Tactile cues, Verbal cues, and Handouts Education comprehension: verbalized understanding, returned demonstration, verbal cues required, tactile cues required, and needs further education   HOME EXERCISE PROGRAM: Access Code: [id number] [email or web address] Access Code: [email or web address] Access Code: [email or web address] - s/p R hip fx    ASSESSMENT:  CLINICAL IMPRESSION: 01/28/2022 Pt with improved ROM of R hip and knee this week. Minimal soreness with ROM. He has soreness with palpation of lateral/mid quad.  He continues to have ankle stiffness, education on need for continued mobility for ankle. He is more challenged with standing stability with pre-gait weight shifts today. Poor stability at ankle and hip seen with weight shift on R, plan to progress strength and standing stability as tolerated.   OBJECTIVE IMPAIRMENTS Abnormal gait, decreased activity tolerance, decreased balance, decreased endurance, decreased knowledge of use of DME, decreased mobility, difficulty walking, decreased ROM, decreased strength, hypomobility, increased muscle spasms, impaired flexibility, and pain.   ACTIVITY LIMITATIONS cleaning, community activity, driving, meal prep, yard work, and shopping.   PERSONAL FACTORS  none  are also affecting patient's functional outcome.    REHAB POTENTIAL: Good  CLINICAL DECISION MAKING: Stable/uncomplicated  EVALUATION COMPLEXITY: Low   GOALS: Goals reviewed with patient? Yes  SHORT TERM GOALS: Target date: 01/15/22  Pt to be independent with initial HEP Goal status: INITIAL   .  Pt to report pain levels 0-3/10 in R hip with activity.  Goal status: INITIAL    LONG TERM GOALS: Target date: 02/12/2022  Pt to be independent with final HEP Goal status: INITIAL  2.  Pt to report decreased pain in R knee to 0-2/10 with activity  Goal status: INITIAL  3.  Pt to demo soft tissue limitations in R quad and thigh to be WNL, to improve pain and function.   Goal status: INITIAL  4.  Pt to demo independent ambulation for community distances, without pain in knee, thigh or hip , Goal status: INITIAL  5.  Pt to demo improved strength of R hip to at least 4/5 to improve stability, gait, and stairs.   Goal status: INITIAL       PLAN: PT FREQUENCY: 1-2x/week  PT DURATION: 6 weeks  PLANNED INTERVENTIONS: Therapeutic exercises, Therapeutic activity, Neuromuscular re-education, Balance training, Gait training, Patient/Family education, Joint mobilization, Stair training,  DME instructions, Dry Needling, Electrical stimulation, Spinal manipulation, Spinal mobilization, Cryotherapy, Moist heat, Taping, Vasopneumatic device, Ultrasound, Ionotophoresis 4mg /ml Dexamethasone, and Manual therapy  PLAN FOR NEXT SESSION: Manual for R quad/ITB, needling as needed, strength for R hip, ankle mobility/gait.   Lyndee Hensen, PT, DPT 11:49 AM  01/28/22

## 2022-01-29 ENCOUNTER — Encounter: Payer: Self-pay | Admitting: Cardiology

## 2022-01-29 ENCOUNTER — Ambulatory Visit: Payer: Medicare Other | Admitting: Cardiology

## 2022-01-29 VITALS — BP 131/62 | HR 75 | Temp 98.5°F | Resp 16 | Ht 70.0 in | Wt 182.2 lb

## 2022-01-29 NOTE — Telephone Encounter (Signed)
Called patient he has no issues and has already made another appointment to come see

## 2022-01-29 NOTE — Progress Notes (Addendum)
EKG 01/30/2022: Sinus rhythm with first-degree AV block at rate of 66 bpm, normal axis, no evidence of ischemia.  Normal QT interval.  Patient rescheduled his visit due to my delay

## 2022-01-29 NOTE — Telephone Encounter (Signed)
From patient.

## 2022-01-30 ENCOUNTER — Telehealth (HOSPITAL_COMMUNITY): Payer: Self-pay

## 2022-01-30 ENCOUNTER — Encounter: Payer: Self-pay | Admitting: Physical Therapy

## 2022-01-30 ENCOUNTER — Ambulatory Visit (INDEPENDENT_AMBULATORY_CARE_PROVIDER_SITE_OTHER): Payer: Medicare Other | Admitting: Physical Therapy

## 2022-01-30 DIAGNOSIS — M79651 Pain in right thigh: Secondary | ICD-10-CM

## 2022-01-30 DIAGNOSIS — M25551 Pain in right hip: Secondary | ICD-10-CM | POA: Diagnosis not present

## 2022-01-30 DIAGNOSIS — M25571 Pain in right ankle and joints of right foot: Secondary | ICD-10-CM | POA: Diagnosis not present

## 2022-01-30 DIAGNOSIS — R2689 Other abnormalities of gait and mobility: Secondary | ICD-10-CM

## 2022-01-30 DIAGNOSIS — M25671 Stiffness of right ankle, not elsewhere classified: Secondary | ICD-10-CM

## 2022-01-30 NOTE — Therapy (Signed)
OUTPATIENT PHYSICAL THERAPY TREATMENT   Patient Name: Gregory [name] MRN: [id number] DOB:[birth year], 81 y.o., male Today's Date:01/30/2022    PT End of Session - 01/30/22 1101     Visit Number 11    Number of Visits 16    Date for PT Re-Evaluation 02/12/22    Authorization Type Medicare,  Re-eval at visit 3.    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 01/12/2020   Arthritis    "right knee" (01/28/2016)   Atypical atrial flutter (HCC) 05/17/2019   Diverticulosis of colon (without mention of hemorrhage)    Dysrhythmia    Hyperlipidemia    Hypertension    Internal hemorrhoids without mention of complication    Personal history of colonic polyps 01/17/2002   adenomatous, tublar adenoma 02/10/2007   PNA (pneumonia) 03/31/2012   Past Surgical History:  Procedure Laterality Date   ATRIAL FIBRILLATION ABLATION  11/28/2016   ATRIAL FIBRILLATION ABLATION N/A 11/28/2016   Procedure: Atrial Fibrillation Ablation;  Surgeon: Regan Lemming, MD;  [facility];  Service: Cardiovascular;  Laterality: N/A;   ATRIAL FIBRILLATION ABLATION N/A 03/18/2019   Procedure: ATRIAL FIBRILLATION ABLATION;  Surgeon: Regan Lemming, MD;  [facility]: [facility];  Service: Cardiovascular;  Laterality: N/A;   CARDIOVERSION  05/03/2012   Procedure: CARDIOVERSION;  Surgeon: [name], MD;  Location: [facility] ENDOSCOPY;  Service: Cardiovascular;  Laterality: N/A;   CARDIOVERSION  06/08/2012   Procedure: CARDIOVERSION;  Surgeon: [name], MD;  Location: [facility] ENDOSCOPY;  Service: Cardiovascular;  Laterality: N/A;  Lawrence Marseilles Wille Celeste   CARDIOVERSION N/A 10/28/2013   Procedure: CARDIOVERSION;   Surgeon: [name], MD;  Location: [facility] ENDOSCOPY;  Service: Cardiovascular;  Laterality: N/A;   CARDIOVERSION N/A 06/30/2014   Procedure: CARDIOVERSION;  Surgeon: [name], MD;  Location: [facility] ENDOSCOPY;  Service: Cardiovascular;  Laterality: N/A;  H&P in file   CARDIOVERSION N/A 09/05/2014   Procedure: CARDIOVERSION;  Surgeon: [name], MD;  Location: [facility] ENDOSCOPY;  Service: Cardiovascular;  Laterality: N/A;   CARDIOVERSION N/A 12/24/2015   Procedure: CARDIOVERSION;  Surgeon: [name], MD;  Location: [facility] ENDOSCOPY;  Service: Cardiovascular;  Laterality: N/A;   CARDIOVERSION N/A 01/30/2016   Procedure: CARDIOVERSION;  Surgeon: [name], MD;  Location: [facility] ENDOSCOPY;  Service: Cardiovascular;  Laterality: N/A;   CARDIOVERSION N/A 02/08/2016   Procedure: CARDIOVERSION;  Surgeon: [name], MD;  Location: [facility] ENDOSCOPY;  Service: Cardiovascular;  Laterality: N/A;   CARDIOVERSION N/A 02/08/2019   Procedure: CARDIOVERSION;  Surgeon: [name], MD;  Location: [facility] ENDOSCOPY;  Service: Cardiovascular;  Laterality: N/A;   CARDIOVERSION N/A 05/24/2019   Procedure: CARDIOVERSION;  Surgeon: Parke Poisson, MD;  Location: [facility] ENDOSCOPY;  Service: Cardiovascular;  Laterality: N/A;   CARDIOVERSION N/A 09/21/2019   Procedure: CARDIOVERSION;  Surgeon: [name], MD;  Location: [facility] ENDOSCOPY;  Service: Cardiovascular;  Laterality: N/A;   CARDIOVERSION N/A 12/01/2019   Procedure: CARDIOVERSION;  Surgeon: [name], MD;  Location: [facility] ENDOSCOPY;  Service: Cardiovascular;  Laterality: N/A;   CARDIOVERSION N/A 08/20/2021   Procedure: CARDIOVERSION;  Surgeon: Jacinto Halim,  Vonna Kotyk, MD;  Location: [facility] ENDOSCOPY;  Service: Cardiovascular;  Laterality: N/A;   CARDIOVERSION N/A 10/18/2021   Procedure: CARDIOVERSION;  Surgeon: [name], MD;  Location: [facility] ENDOSCOPY;  Service: Cardiovascular;  Laterality: N/A;   CATARACT EXTRACTION W/ INTRAOCULAR LENS  IMPLANT, BILATERAL Bilateral    COLONOSCOPY     INGUINAL HERNIA REPAIR  Bilateral    as child   INTRAMEDULLARY (IM) NAIL INTERTROCHANTERIC Right 12/29/2021   Procedure: INTRAMEDULLARY (IM) NAIL INTERTROCHANTRIC;  Surgeon: Terance Hart, MD;  [facility]: [facility];  Service: Orthopedics;  Laterality: Right;   KNEE ARTHROSCOPY Right 2004   Patient Active Problem List   Diagnosis Date Noted   Closed right hip fracture (HCC) 12/28/2021   HLD (hyperlipidemia) 12/28/2021   Change in bowel habits 12/05/2021   Generalized abdominal pain 12/05/2021   BPH with elevated PSA 12/13/2019   Dermatitis 12/13/2019   Encounter for Medicare annual wellness exam 12/13/2019   Chronic pain of right knee 10/10/2019   Acquired thrombophilia (HCC) 05/17/2019   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/06/2018   Primary osteoarthritis of right knee 08/06/2018   Pes anserinus bursitis of left knee 10/31/2016   Allergic rhinitis 02/14/2016   Hearing loss 02/13/2016   CKD (chronic kidney disease) stage 3, GFR 30-59 ml/min (HCC) 02/13/2016   Paroxysmal atrial fibrillation (HCC) 03/31/2012   Hypertension 03/31/2012   Hemorrhoids, external 05/15/2011    [facility]  REFERRING PROVIDER: Roe Coombs*, Dub Mikes (hip)  REFERRING DIAG: S/P R hip fracture/ IM repair 12/29/21,   S/P R TKE (uni)   THERAPY DIAG:  Pain in right hip  Pain in right thigh  Stiffness of right ankle, not elsewhere classified  Pain in right ankle and joints of right foot  Other abnormalities of gait and mobility  ONSET DATE:  Surgery date: 09/11/21 R Uni TKA   SUBJECTIVE:   SUBJECTIVE STATEMENT: States doing well. Lateral thigh gets sore at times. Doing better using cane.    PERTINENT HISTORY: Chronic A-Fib,   PAIN:  Are you having pain? Yes: NPRS scale: 2/10 Pain location: R knee Pain description: achey Aggravating factors: increased activity  Relieving factors: Rest, ice   Are you having pain? Yes: NPRS scale: 5/10 Pain location: R hip Pain  description: achey, sore  Aggravating factors: increased activity , movement  Relieving factors: Rest, ice    PRECAUTIONS: None  WEIGHT BEARING RESTRICTIONS No  FALLS:  Has patient fallen in last 6 months? Yes. Number of falls 1  PLOF: Independent  PATIENT GOALS  Decreased pain   OBJECTIVE:  updated 01/30/22   COGNITION:  Overall cognitive status: Within functional limits for tasks assessed      PALPATION: Mild soreness in proximal rec fem, prox and mid vastus lateralis, mid ITB,    LE ROM:  Hip ROM: Mild limitation  Passive ROM Right 12/12/21 R 01/01/22 R 01/30/22                                                     Knee flexion 120       Knee extension 0               Hip flexion  ~90 deg                       (  Blank rows = not tested)   LE MMT:  R Knee: 4/5,  R hip: 3-/5    TODAY'S TREATMENT:  01/28/22:  Therapeutic Exercise: Aerobic: Recumbent bike L1 x 8 min Supine:   SLR 2x10 bil;  Prone:  Seated:  Standing:  SPC 45 ft x 4, no SPC 45 ft x 4;  Staggered stance weight shifts x 15 bil;  Hip abd 2x10 bil; HR x 20; fwd step ups 6 in x 10 bil, 2 UE support;  Stretches:  Hip ER fallouts x 10 for adductor tightness;  Gait:     Neuromuscular Re-education: Manual Therapy: STM to R lateral quad and thigh, manual hip adductor stretch x 3;     PATIENT EDUCATION:  Education details:  reviewed HEP   Person educated: Patient Education method: Consulting civil engineer, Demonstration, Tactile cues, Verbal cues, and Handouts Education comprehension: verbalized understanding, returned demonstration, verbal cues required, tactile cues required, and needs further education   HOME EXERCISE PROGRAM: Access Code: [email or web address] Access Code: [email or web address] Access Code: [email or web address] - s/p R hip fx    ASSESSMENT:  CLINICAL IMPRESSION: 01/30/2022 Pt with improving mobility each week. Improved ability and strength with standing ther ex today. Able to walk short distance without  SPC, will continue to use at home and out in community for now. He has tightness and tenderness in lateral quad and in adductor on R, will continue manual and mobility for this as needed. Pt to benefit from continued strength and stability for R LE. Will continue at 1x/wk.   OBJECTIVE IMPAIRMENTS Abnormal gait, decreased activity tolerance, decreased balance, decreased endurance, decreased knowledge of use of DME, decreased mobility, difficulty walking, decreased ROM, decreased strength, hypomobility, increased muscle spasms, impaired flexibility, and pain.   ACTIVITY LIMITATIONS cleaning, community activity, driving, meal prep, yard work, and shopping.   PERSONAL FACTORS  none  are also affecting patient's functional outcome.    REHAB POTENTIAL: Good  CLINICAL DECISION MAKING: Stable/uncomplicated  EVALUATION COMPLEXITY: Low   GOALS: Goals reviewed with patient? Yes  SHORT TERM GOALS: Target date: 01/15/22  Pt to be independent with initial HEP Goal status: INITIAL   .  Pt to report pain levels 0-3/10 in R hip with activity.  Goal status: INITIAL    LONG TERM GOALS: Target date: 02/12/2022  Pt to be independent with final HEP Goal status: INITIAL  2.  Pt to report decreased pain in R knee to 0-2/10 with activity  Goal status: INITIAL  3.  Pt to demo soft tissue limitations in R quad and thigh to be WNL, to improve pain and function.   Goal status: INITIAL  4.  Pt to demo independent ambulation for community distances, without pain in knee, thigh or hip , Goal status: INITIAL  5.  Pt to demo improved strength of R hip to at least 4/5 to improve stability, gait, and stairs.   Goal status: INITIAL       PLAN: PT FREQUENCY: 1-2x/week  PT DURATION: 6 weeks  PLANNED INTERVENTIONS: Therapeutic exercises, Therapeutic activity, Neuromuscular re-education, Balance training, Gait training, Patient/Family education, Joint mobilization, Stair training, DME instructions, Dry  Needling, Electrical stimulation, Spinal manipulation, Spinal mobilization, Cryotherapy, Moist heat, Taping, Vasopneumatic device, Ultrasound, Ionotophoresis 4mg /ml Dexamethasone, and Manual therapy  PLAN FOR NEXT SESSION: Manual for R quad/ITB, needling as needed, strength for R hip, ankle mobility/gait.   Lyndee Hensen, PT, DPT 11:12 AM  01/30/22

## 2022-01-30 NOTE — Telephone Encounter (Signed)
Pharmacy Transitions of Care Follow-up Telephone Call  Date of discharge: 12/30/2021  Discharge Diagnosis: Atrial Fibrillation   How have you been since you were released from the hospital? Patient has been doing well since discharge. He has no questions or concerns regarding his medications.    Medication changes made at discharge: START taking: docusate sodium (COLACE)  CHANGE how you take: Eliquis (apixaban)    ASK how to take: oxyCODONE 5 MG immediate release tablet (Roxicodone)   Medication changes verified by the patient? Yes    Medication Accessibility:  Home Pharmacy: [facility]     Was the patient provided with refills on discharged medications? No   Have all prescriptions been transferred from [facility] to home pharmacy? Yes   Is the patient interested in using a [place] pharmacy? No  Is the patient able to afford medications? Yes  Referred patient to patient care advocate for medication assistance? No  Medication Review:   APIXABAN (ELIQUIS)  Apixaban 2.5 mg BID  - Discussed importance of taking medication around the same time every day.  - Advised patient of medications to avoid (NSAIDs, ASA maintenance doses>100 mg daily)  - Educated that Tylenol (acetaminophen) will be the preferred analgesic to prevent risk of bleeding. - Emphasized importance of monitoring for signs and symptoms of bleeding (abnormal bruising, prolonged bleeding, nose bleeds, bleeding from gums, discolored urine, black tarry stools)  - Advised patient to alert all providers of anticoagulation therapy prior to starting a new medication or having a procedure.  Follow-up Appointments:  PCP Hospital f/u appt confirmed? Saw Rueben Bash, [place] on 01/21/2022.   Specialist Hospital f/u appt confirmed? Saw [name], [place] on 01/29/2022   If their condition worsens, is the pt aware to call PCP or go to the Emergency Dept.? Yes  Final Patient Assessment: Patient has had Cardiology follow-up. Patient  has an apixaban 5 mg prescription from [place] [facility] that has refills. He picks this up from [facility] and splits the pills.

## 2022-02-04 ENCOUNTER — Encounter: Payer: Medicare Other | Admitting: Physical Therapy

## 2022-02-06 ENCOUNTER — Ambulatory Visit (INDEPENDENT_AMBULATORY_CARE_PROVIDER_SITE_OTHER): Payer: Medicare Other | Admitting: Physical Therapy

## 2022-02-06 ENCOUNTER — Encounter: Payer: Self-pay | Admitting: Physical Therapy

## 2022-02-06 DIAGNOSIS — M79651 Pain in right thigh: Secondary | ICD-10-CM

## 2022-02-06 DIAGNOSIS — M25551 Pain in right hip: Secondary | ICD-10-CM

## 2022-02-06 DIAGNOSIS — R2689 Other abnormalities of gait and mobility: Secondary | ICD-10-CM

## 2022-02-06 DIAGNOSIS — M25571 Pain in right ankle and joints of right foot: Secondary | ICD-10-CM

## 2022-02-06 DIAGNOSIS — M25671 Stiffness of right ankle, not elsewhere classified: Secondary | ICD-10-CM | POA: Diagnosis not present

## 2022-02-06 NOTE — Therapy (Signed)
OUTPATIENT PHYSICAL THERAPY TREATMENT   Patient Name: Gregory [name] MRN: [id number] DOB:1941-03-29, 81 y.o., male Today's Date:02/06/2022    PT End of Session - 02/06/22 1112     Visit Number 12    Number of Visits 16    Date for PT Re-Evaluation 02/12/22    Authorization Type Medicare,  Re-eval at visit 3.    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 01/12/2020   Arthritis    "right knee" (01/28/2016)   Atypical atrial flutter (HCC) 05/17/2019   Diverticulosis of colon (without mention of hemorrhage)    Dysrhythmia    Hyperlipidemia    Hypertension    Internal hemorrhoids without mention of complication    Personal history of colonic polyps 01/17/2002   adenomatous, tublar adenoma 02/10/2007   PNA (pneumonia) 03/31/2012   Past Surgical History:  Procedure Laterality Date   ATRIAL FIBRILLATION ABLATION  11/28/2016   ATRIAL FIBRILLATION ABLATION N/A 11/28/2016   Procedure: Atrial Fibrillation Ablation;  Surgeon: Regan Lemming, MD;  [facility];  Service: Cardiovascular;  Laterality: N/A;   ATRIAL FIBRILLATION ABLATION N/A 03/18/2019   Procedure: ATRIAL FIBRILLATION ABLATION;  Surgeon: Regan Lemming, MD;  [facility];  Service: Cardiovascular;  Laterality: N/A;   CARDIOVERSION  05/03/2012   Procedure: CARDIOVERSION;  Surgeon: [name], MD;  Location: [facility] ENDOSCOPY;  Service: Cardiovascular;  Laterality: N/A;   CARDIOVERSION  06/08/2012   Procedure: CARDIOVERSION;  Surgeon: [name], MD;  Location: [facility] ENDOSCOPY;  Service: Cardiovascular;  Laterality: N/A;  [place] Wille Celeste   CARDIOVERSION N/A 10/28/2013   Procedure: CARDIOVERSION;   Surgeon: [name], MD;  Location: [facility] ENDOSCOPY;  Service: Cardiovascular;  Laterality: N/A;   CARDIOVERSION N/A 06/30/2014   Procedure: CARDIOVERSION;  Surgeon: [name], MD;  Location: [facility] ENDOSCOPY;  Service: Cardiovascular;  Laterality: N/A;  H&P in file   CARDIOVERSION N/A 09/05/2014   Procedure: CARDIOVERSION;  Surgeon: [name], MD;  Location: [facility] ENDOSCOPY;  Service: Cardiovascular;  Laterality: N/A;   CARDIOVERSION N/A 12/24/2015   Procedure: CARDIOVERSION;  Surgeon: [name], MD;  Location: [facility] ENDOSCOPY;  Service: Cardiovascular;  Laterality: N/A;   CARDIOVERSION N/A 01/30/2016   Procedure: CARDIOVERSION;  Surgeon: [name], MD;  Location: [facility] ENDOSCOPY;  Service: Cardiovascular;  Laterality: N/A;   CARDIOVERSION N/A 02/08/2016   Procedure: CARDIOVERSION;  Surgeon: [name], MD;  Location: [facility] ENDOSCOPY;  Service: Cardiovascular;  Laterality: N/A;   CARDIOVERSION N/A 02/08/2019   Procedure: CARDIOVERSION;  Surgeon: [name], MD;  Location: [facility] ENDOSCOPY;  Service: Cardiovascular;  Laterality: N/A;   CARDIOVERSION N/A 05/24/2019   Procedure: CARDIOVERSION;  Surgeon: Parke Poisson, MD;  Location: [facility] ENDOSCOPY;  Service: Cardiovascular;  Laterality: N/A;   CARDIOVERSION N/A 09/21/2019   Procedure: CARDIOVERSION;  Surgeon: [name], MD;  Location: [facility] ENDOSCOPY;  Service: Cardiovascular;  Laterality: N/A;   CARDIOVERSION N/A 12/01/2019   Procedure: CARDIOVERSION;  Surgeon: [name], MD;  Location: [facility] ENDOSCOPY;  Service: Cardiovascular;  Laterality: N/A;   CARDIOVERSION N/A 08/20/2021   Procedure: CARDIOVERSION;  Surgeon:  Adrian Prows, MD;  Location: [facility] ENDOSCOPY;  Service: Cardiovascular;  Laterality: N/A;   CARDIOVERSION N/A 10/18/2021   Procedure: CARDIOVERSION;  Surgeon: Adrian Prows, MD;  Location: [facility] ENDOSCOPY;  Service: Cardiovascular;  Laterality: N/A;   CATARACT EXTRACTION W/ INTRAOCULAR LENS  IMPLANT, BILATERAL Bilateral    COLONOSCOPY     INGUINAL HERNIA REPAIR  Bilateral    as child   INTRAMEDULLARY (IM) NAIL INTERTROCHANTERIC Right 12/29/2021   Procedure: INTRAMEDULLARY (IM) NAIL INTERTROCHANTRIC;  Surgeon: Erle Crocker, MD;  Location: [place];  Service: Orthopedics;  Laterality: Right;   KNEE ARTHROSCOPY Right 2004   Patient Active Problem List   Diagnosis Date Noted   Closed right hip fracture ([place]) 12/28/2021   HLD (hyperlipidemia) 12/28/2021   Change in bowel habits 12/05/2021   Generalized abdominal pain 12/05/2021   BPH with elevated PSA 12/13/2019   Dermatitis 12/13/2019   Encounter for Medicare annual wellness exam 12/13/2019   Chronic pain of right knee 10/10/2019   Acquired thrombophilia ([place]) 05/17/2019   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/06/2018   Primary osteoarthritis of right knee 08/06/2018   Pes anserinus bursitis of left knee 10/31/2016   Allergic rhinitis 02/14/2016   Hearing loss 02/13/2016   CKD (chronic kidney disease) stage 3, GFR 30-59 ml/min (HCC) 02/13/2016   Paroxysmal atrial fibrillation ([place]) 03/31/2012   Hypertension 03/31/2012   Hemorrhoids, external 05/15/2011    PCP: [place], [place] PROVIDER: Josefa Half*, Melony Overly (hip)  REFERRING DIAG: S/P R hip fracture/ IM repair 12/29/21,   S/P R TKE (uni)   THERAPY DIAG:  Pain in right hip  Pain in right thigh  Stiffness of right ankle, not elsewhere classified  Pain in right ankle and joints of right foot  Other abnormalities of gait and mobility  ONSET DATE:  Surgery date: 09/11/21 R Uni TKA   SUBJECTIVE:   SUBJECTIVE STATEMENT: States doing well. Not using cane much at home. Still has soreness in anterior thigh with walking.     PERTINENT HISTORY: Chronic A-Fib,   PAIN:  Are you having pain? Yes: NPRS scale: 2/10 Pain location: R knee Pain description: achey Aggravating factors: increased activity  Relieving factors: Rest, ice   Are you having pain? Yes: NPRS scale: 0-2/10 Pain  location: R hip Pain description: achey, sore  Aggravating factors: increased activity , movement  Relieving factors: Rest, ice    PRECAUTIONS: None  WEIGHT BEARING RESTRICTIONS No  FALLS:  Has patient fallen in last 6 months? Yes. Number of falls 1  PLOF: Independent  PATIENT GOALS  Decreased pain   OBJECTIVE:  updated 02/06/22   COGNITION:  Overall cognitive status: Within functional limits for tasks assessed      PALPATION: Mild soreness in proximal rec fem, prox and mid vastus lateralis, mid ITB,    LE ROM:  Hip ROM: Mild limitation  Passive ROM Right 12/12/21 R 01/01/22 R 01/30/22                                                     Knee flexion 120       Knee extension 0               Hip flexion  ~90 deg WFL                      (  Blank rows = not tested)   LE MMT:  R Knee: 4+/5,  R hip: 4-/5    TODAY'S TREATMENT:  02/06/22:  Therapeutic Exercise: Aerobic: Recumbent bike L2 x 8 min Supine:   SLR 2 x 10 bil; Bridging 2 x 10;  Prone:  Seated:  Standing:   no AD 45 ft x 6;  Staggered stance weight shifts x 15 bil;  Hip abd 2x10 bil;  Slow march x 20; HR x 20;   Stretches:  Hip ER fallouts x 10 for adductor tightness; Gastroc stretch on step x 2 min bil;  Standing hip flexor stretch at counter 30 sec x 2 bil;  Gait:     Neuromuscular Re-education: Manual Therapy:    PATIENT EDUCATION:  Education details:  reviewed HEP   Person educated: Patient Education method: Programmer, multimedia, Demonstration, Tactile cues, Verbal cues, and Handouts Education comprehension: verbalized understanding, returned demonstration, verbal cues required, tactile cues required, and needs further education   HOME EXERCISE PROGRAM: Access Code: [email or web address] Access Code: [email or web address] Access Code: [email or web address] - s/p R hip fx    ASSESSMENT:  CLINICAL IMPRESSION: 02/06/2022 Still feeling pain in anterior hip and into adductor with walking. Pain levels low, up to 2/10  .Still quite challenged with standing stability on R LE, noted with activities today. Will benefit from continued work on this in future sessions .   OBJECTIVE IMPAIRMENTS Abnormal gait, decreased activity tolerance, decreased balance, decreased endurance, decreased knowledge of use of DME, decreased mobility, difficulty walking, decreased ROM, decreased strength, hypomobility, increased muscle spasms, impaired flexibility, and pain.   ACTIVITY LIMITATIONS cleaning, community activity, driving, meal prep, yard work, and shopping.   PERSONAL FACTORS  none  are also affecting patient's functional outcome.    REHAB POTENTIAL: Good  CLINICAL DECISION MAKING: Stable/uncomplicated  EVALUATION COMPLEXITY: Low   GOALS: Goals reviewed with patient? Yes  SHORT TERM GOALS: Target date: 01/15/22  Pt to be independent with initial HEP Goal status: INITIAL   .  Pt to report pain levels 0-3/10 in R hip with activity.  Goal status: INITIAL    LONG TERM GOALS: Target date: 02/12/2022  Pt to be independent with final HEP Goal status: INITIAL  2.  Pt to report decreased pain in R knee to 0-2/10 with activity  Goal status: INITIAL  3.  Pt to demo soft tissue limitations in R quad and thigh to be WNL, to improve pain and function.   Goal status: INITIAL  4.  Pt to demo independent ambulation for community distances, without pain in knee, thigh or hip , Goal status: INITIAL  5.  Pt to demo improved strength of R hip to at least 4/5 to improve stability, gait, and stairs.   Goal status: INITIAL       PLAN: PT FREQUENCY: 1-2x/week  PT DURATION: 6 weeks  PLANNED INTERVENTIONS: Therapeutic exercises, Therapeutic activity, Neuromuscular re-education, Balance training, Gait training, Patient/Family education, Joint mobilization, Stair training, DME instructions, Dry Needling, Electrical stimulation, Spinal manipulation, Spinal mobilization, Cryotherapy, Moist heat, Taping, Vasopneumatic  device, Ultrasound, Ionotophoresis 4mg /ml Dexamethasone, and Manual therapy  PLAN FOR NEXT SESSION: Manual for R quad/ITB, needling as needed, strength for R hip, ankle mobility/gait.   , PT, DPT 11:49 AM  02/06/22

## 2022-02-11 ENCOUNTER — Encounter: Payer: Medicare Other | Admitting: Physical Therapy

## 2022-02-13 ENCOUNTER — Ambulatory Visit (INDEPENDENT_AMBULATORY_CARE_PROVIDER_SITE_OTHER): Payer: Medicare Other | Admitting: Physical Therapy

## 2022-02-13 ENCOUNTER — Encounter: Payer: Self-pay | Admitting: Physical Therapy

## 2022-02-13 DIAGNOSIS — M25671 Stiffness of right ankle, not elsewhere classified: Secondary | ICD-10-CM | POA: Diagnosis not present

## 2022-02-13 DIAGNOSIS — M25551 Pain in right hip: Secondary | ICD-10-CM

## 2022-02-13 DIAGNOSIS — M25571 Pain in right ankle and joints of right foot: Secondary | ICD-10-CM

## 2022-02-13 DIAGNOSIS — M79651 Pain in right thigh: Secondary | ICD-10-CM | POA: Diagnosis not present

## 2022-02-13 DIAGNOSIS — R2689 Other abnormalities of gait and mobility: Secondary | ICD-10-CM

## 2022-02-13 NOTE — Therapy (Signed)
OUTPATIENT PHYSICAL THERAPY TREATMENT/Re-Cert   Patient Name: Gregory [name] MRN: [id number] DOB:06/25/1940, 81 y.o., male Today's Date: 02/13/2022    PT End of Session - 02/13/22 1101     Visit Number 13    Number of Visits 16    Date for PT Re-Evaluation 03/27/22    Authorization Type Medicare,  Re-eval at visit 3, re-cert at visit 63.    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 01/12/2020   Arthritis    "right knee" (01/28/2016)   Atypical atrial flutter ([place]) 05/17/2019   Diverticulosis of colon (without mention of hemorrhage)    Dysrhythmia    Hyperlipidemia    Hypertension    Internal hemorrhoids without mention of complication    Personal history of colonic polyps 01/17/2002   adenomatous, tublar adenoma 02/10/2007   PNA (pneumonia) 03/31/2012   Past Surgical History:  Procedure Laterality Date   ATRIAL FIBRILLATION ABLATION  11/28/2016   ATRIAL FIBRILLATION ABLATION N/A 11/28/2016   Procedure: Atrial Fibrillation Ablation;  Surgeon: Constance Haw, MD;  Location: [place] [facility];  Service: Cardiovascular;  Laterality: N/A;   ATRIAL FIBRILLATION ABLATION N/A 03/18/2019   Procedure: ATRIAL FIBRILLATION ABLATION;  Surgeon: Constance Haw, MD;  Location: [place] [facility];  Service: Cardiovascular;  Laterality: N/A;   CARDIOVERSION  05/03/2012   Procedure: CARDIOVERSION;  Surgeon: Laverda Page, MD;  Location: [place];  Service: Cardiovascular;  Laterality: N/A;   CARDIOVERSION  06/08/2012   Procedure: CARDIOVERSION;  Surgeon: Laverda Page, MD;  Location: [place];  Service: Cardiovascular;  Laterality: N/A;  [name]   CARDIOVERSION N/A 10/28/2013    Procedure: CARDIOVERSION;  Surgeon: Laverda Page, MD;  Location: [place];  Service: Cardiovascular;  Laterality: N/A;   CARDIOVERSION N/A 06/30/2014   Procedure: CARDIOVERSION;  Surgeon: Laverda Page, MD;  Location: [place];  Service: Cardiovascular;  Laterality: N/A;  H&P in file   CARDIOVERSION N/A 09/05/2014   Procedure: CARDIOVERSION;  Surgeon: Adrian Prows, MD;  Location: [place];  Service: Cardiovascular;  Laterality: N/A;   CARDIOVERSION N/A 12/24/2015   Procedure: CARDIOVERSION;  Surgeon: Adrian Prows, MD;  Location: [place];  Service: Cardiovascular;  Laterality: N/A;   CARDIOVERSION N/A 01/30/2016   Procedure: CARDIOVERSION;  Surgeon: Adrian Prows, MD;  Location: [place];  Service: Cardiovascular;  Laterality: N/A;   CARDIOVERSION N/A 02/08/2016   Procedure: CARDIOVERSION;  Surgeon: Adrian Prows, MD;  Location: [place];  Service: Cardiovascular;  Laterality: N/A;   CARDIOVERSION N/A 02/08/2019   Procedure: CARDIOVERSION;  Surgeon: Adrian Prows, MD;  Location: [place];  Service: Cardiovascular;  Laterality: N/A;   CARDIOVERSION N/A 05/24/2019   Procedure: CARDIOVERSION;  Surgeon: Elouise Munroe, MD;  Location: [facility] ENDOSCOPY;  Service: Cardiovascular;  Laterality: N/A;   CARDIOVERSION N/A 09/21/2019   Procedure: CARDIOVERSION;  Surgeon: Adrian Prows, MD;  Location: [facility] ENDOSCOPY;  Service: Cardiovascular;  Laterality: N/A;   CARDIOVERSION N/A 12/01/2019   Procedure: CARDIOVERSION;  Surgeon: Adrian Prows, MD;  Location: [place];  Service: Cardiovascular;  Laterality: N/A;   CARDIOVERSION N/A 08/20/2021  Procedure: CARDIOVERSION;  Surgeon: Adrian Prows, MD;  Location: [facility] ENDOSCOPY;  Service: Cardiovascular;  Laterality: N/A;   CARDIOVERSION N/A 10/18/2021   Procedure: CARDIOVERSION;  Surgeon: Adrian Prows, MD;  Location: [place];  Service: Cardiovascular;  Laterality: N/A;   CATARACT EXTRACTION W/ INTRAOCULAR LENS  IMPLANT, BILATERAL Bilateral    COLONOSCOPY      INGUINAL HERNIA REPAIR Bilateral    as child   INTRAMEDULLARY (IM) NAIL INTERTROCHANTERIC Right 12/29/2021   Procedure: INTRAMEDULLARY (IM) NAIL INTERTROCHANTRIC;  Surgeon: Erle Crocker, MD;  Location: [place];  Service: Orthopedics;  Laterality: Right;   KNEE ARTHROSCOPY Right 2004   Patient Active Problem List   Diagnosis Date Noted   Closed right hip fracture ([place]) 12/28/2021   HLD (hyperlipidemia) 12/28/2021   Change in bowel habits 12/05/2021   Generalized abdominal pain 12/05/2021   BPH with elevated PSA 12/13/2019   Dermatitis 12/13/2019   Encounter for Medicare annual wellness exam 12/13/2019   Chronic pain of right knee 10/10/2019   Acquired thrombophilia ([place]) 05/17/2019   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/06/2018   Primary osteoarthritis of right knee 08/06/2018   Pes anserinus bursitis of left knee 10/31/2016   Allergic rhinitis 02/14/2016   Hearing loss 02/13/2016   CKD (chronic kidney disease) stage 3, GFR 30-59 ml/min (HCC) 02/13/2016   Paroxysmal atrial fibrillation ([place]) 03/31/2012   Hypertension 03/31/2012   Hemorrhoids, external 05/15/2011    PCP: [place], [place]: Josefa Half*, Melony Overly (hip)  REFERRING DIAG: S/P R hip fracture/ IM repair 12/29/21,   S/P R TKE (uni)   THERAPY DIAG:  Pain in right hip  Pain in right thigh  Stiffness of right ankle, not elsewhere classified  Pain in right ankle and joints of right foot  Other abnormalities of gait and mobility  ONSET DATE:  Surgery date: 09/11/21 R Uni TKA   SUBJECTIVE:   SUBJECTIVE STATEMENT: Pt walking without cane at this time. Feels he is doing better, still having pain in front of hip. Taking tylenol daily. Still having soreness on outside of ankle.   PERTINENT HISTORY: Chronic A-Fib,   PAIN:  Are you having pain? Yes: NPRS scale: 2/10 Pain location: R knee Pain description: achey Aggravating factors: increased activity   Relieving factors: Rest, ice   Are you having pain? Yes: NPRS scale: 0-2/10 Pain location: R hip Pain description: achey, sore  Aggravating factors: increased activity , movement  Relieving factors: Rest, ice    PRECAUTIONS: None  WEIGHT BEARING RESTRICTIONS No  FALLS:  Has patient fallen in last 6 months? Yes. Number of falls 1  PLOF: Independent  PATIENT GOALS  Decreased pain   OBJECTIVE:  updated 02/13/22   COGNITION:  Overall cognitive status: Within functional limits for tasks assessed      PALPATION: Mild soreness in proximal rec fem, prox and mid vastus lateralis, mid ITB,    LE ROM:  Hip ROM: Mild limitation  Passive ROM Right 12/12/21 R 01/01/22 R 01/30/22 R 8/31                                                    Knee flexion 120   WFL    Knee extension 0   WFL            Hip flexion  ~90 deg [facility]  WFL                     (Blank rows = not tested)   LE MMT:  R Knee: 5/5,  R hip: 4/5    TODAY'S TREATMENT:  02/13/22: Therapeutic Exercise: Aerobic: Recumbent bike L2 x 7 min Supine:    S/L:  Hip abd x10 on R;  Seated:  HR x 20;  Sit to stand, slow ecc lowering x 15;  Standing:    Staggered stance weight shifts x 15 bil;  HR x 20, 1 UE support;  Slow march x 20;  Stretches:  Prone hsc (for quad stretch) x 15 on L:  Gastroc stretch on step x 2 min bil;  Standing hip flexor stretch at counter 30 sec x 2 bil;  Gait:     Neuromuscular Re-education: Manual Therapy:    PATIENT EDUCATION:  Education details:  reviewed HEP   Person educated: Patient Education method: Consulting civil engineer, Demonstration, Tactile cues, Verbal cues, and Handouts Education comprehension: verbalized understanding, returned demonstration, verbal cues required, tactile cues required, and needs further education   HOME EXERCISE PROGRAM: Access Code: [email or web address] knee Access Code: [email or web address] Access Code: [email or web address] - s/p R hip fx    ASSESSMENT:  CLINICAL  IMPRESSION: 08/29/1759 Re-Cert:  Pt overall progressing well. He has main complaint of continued pain in anterior R hip. Hip flexor and TFL are tender with palpation and with increased standing activity, as well as initial standing. He has weakness and instability noted in R LE vs L with standing, strengthening and SL activities today. Some difficulty stemming from stiffness in ankle, with lack of medial tibial rotation, and ankle varus, with increased lateral pressure in weight bearing. Plan to continue PT, to improve pain, strength, and to meet LTGs.   OBJECTIVE IMPAIRMENTS Abnormal gait, decreased activity tolerance, decreased balance, decreased endurance, decreased knowledge of use of DME, decreased mobility, difficulty walking, decreased ROM, decreased strength, hypomobility, increased muscle spasms, impaired flexibility, and pain.   ACTIVITY LIMITATIONS cleaning, community activity, driving, meal prep, yard work, and shopping.   PERSONAL FACTORS  none  are also affecting patient's functional outcome.    REHAB POTENTIAL: Good  CLINICAL DECISION MAKING: Stable/uncomplicated  EVALUATION COMPLEXITY: Low   GOALS: Goals reviewed with patient? Yes  SHORT TERM GOALS: Target date: 01/15/22  Pt to be independent with initial HEP Goal status: MET   .  Pt to report pain levels 0-3/10 in R hip with activity.  Goal status: MET    LONG TERM GOALS: Target date: 03/27/2022  Pt to be independent with final HEP Goal status: IN PROGRESS  2.  Pt to report decreased pain in R knee to 0-2/10 with activity  Goal status: IN PROGRESS  3.  Pt to demo soft tissue limitations in R quad and thigh to be WNL, to improve pain and function.   Goal status: IN PROGRESS  4.  Pt to demo independent ambulation for community distances, without pain in knee, thigh or hip , Goal status: IN PROGRESS  5.  Pt to demo improved strength of R hip to at least 4/5 to improve stability, gait, and stairs.   Goal  status: In PROGRESS       PLAN: PT FREQUENCY: 1-2x/week  PT DURATION: 6 weeks  PLANNED INTERVENTIONS: Therapeutic exercises, Therapeutic activity, Neuromuscular re-education, Balance training, Gait training, Patient/Family education, Joint mobilization, Stair training, DME instructions, Dry Needling, Electrical stimulation, Spinal manipulation, Spinal mobilization, Cryotherapy, Moist heat, Taping, Vasopneumatic device, Ultrasound,  Ionotophoresis 90m/ml Dexamethasone, and Manual therapy  PLAN FOR NEXT SESSION:   LLyndee Hensen PT, DPT 11:11 AM  02/13/22

## 2022-02-20 ENCOUNTER — Encounter: Payer: Self-pay | Admitting: Physical Therapy

## 2022-02-20 ENCOUNTER — Ambulatory Visit (INDEPENDENT_AMBULATORY_CARE_PROVIDER_SITE_OTHER): Payer: Medicare Other | Admitting: Physical Therapy

## 2022-02-20 DIAGNOSIS — M25551 Pain in right hip: Secondary | ICD-10-CM | POA: Diagnosis not present

## 2022-02-20 DIAGNOSIS — M25671 Stiffness of right ankle, not elsewhere classified: Secondary | ICD-10-CM

## 2022-02-20 DIAGNOSIS — M79651 Pain in right thigh: Secondary | ICD-10-CM

## 2022-02-20 DIAGNOSIS — M25571 Pain in right ankle and joints of right foot: Secondary | ICD-10-CM

## 2022-02-20 DIAGNOSIS — R2689 Other abnormalities of gait and mobility: Secondary | ICD-10-CM

## 2022-02-20 NOTE — Therapy (Signed)
OUTPATIENT PHYSICAL THERAPY TREATMENT   Patient Name: REINALDO HELT MRN: [id number] DOB:03-06-1941, 81 y.o., male Today's Date: 02/20/2022    PT End of Session - 02/20/22 1212     Visit Number 14    Number of Visits 16    Date for PT Re-Evaluation 03/27/22    Authorization Type Medicare,  Re-eval at visit 3, re-cert at visit 45.    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 01/12/2020   Arthritis    "right knee" (01/28/2016)   Atypical atrial flutter ([place]) 05/17/2019   Diverticulosis of colon (without mention of hemorrhage)    Dysrhythmia    Hyperlipidemia    Hypertension    Internal hemorrhoids without mention of complication    Personal history of colonic polyps 01/17/2002   adenomatous, tublar adenoma 02/10/2007   PNA (pneumonia) 03/31/2012   Past Surgical History:  Procedure Laterality Date   ATRIAL FIBRILLATION ABLATION  11/28/2016   ATRIAL FIBRILLATION ABLATION N/A 11/28/2016   Procedure: Atrial Fibrillation Ablation;  Surgeon: Constance Haw, MD;  Location: [place] [facility];  Service: Cardiovascular;  Laterality: N/A;   ATRIAL FIBRILLATION ABLATION N/A 03/18/2019   Procedure: ATRIAL FIBRILLATION ABLATION;  Surgeon: Constance Haw, MD;  Location: [place] [facility];  Service: Cardiovascular;  Laterality: N/A;   CARDIOVERSION  05/03/2012   Procedure: CARDIOVERSION;  Surgeon: Laverda Page, MD;  Location: [place];  Service: Cardiovascular;  Laterality: N/A;   CARDIOVERSION  06/08/2012   Procedure: CARDIOVERSION;  Surgeon: Laverda Page, MD;  Location: [place];  Service: Cardiovascular;  Laterality: N/A;  [name]   CARDIOVERSION N/A 10/28/2013    Procedure: CARDIOVERSION;  Surgeon: Laverda Page, MD;  Location: [place];  Service: Cardiovascular;  Laterality: N/A;   CARDIOVERSION N/A 06/30/2014   Procedure: CARDIOVERSION;  Surgeon: Laverda Page, MD;  Location: [place];  Service: Cardiovascular;  Laterality: N/A;  H&P in file   CARDIOVERSION N/A 09/05/2014   Procedure: CARDIOVERSION;  Surgeon: Adrian Prows, MD;  Location: [place];  Service: Cardiovascular;  Laterality: N/A;   CARDIOVERSION N/A 12/24/2015   Procedure: CARDIOVERSION;  Surgeon: Adrian Prows, MD;  Location: [place];  Service: Cardiovascular;  Laterality: N/A;   CARDIOVERSION N/A 01/30/2016   Procedure: CARDIOVERSION;  Surgeon: Adrian Prows, MD;  Location: [place];  Service: Cardiovascular;  Laterality: N/A;   CARDIOVERSION N/A 02/08/2016   Procedure: CARDIOVERSION;  Surgeon: Adrian Prows, MD;  Location: [place];  Service: Cardiovascular;  Laterality: N/A;   CARDIOVERSION N/A 02/08/2019   Procedure: CARDIOVERSION;  Surgeon: Adrian Prows, MD;  Location: [place];  Service: Cardiovascular;  Laterality: N/A;   CARDIOVERSION N/A 05/24/2019   Procedure: CARDIOVERSION;  Surgeon: Elouise Munroe, MD;  Location: [facility] ENDOSCOPY;  Service: Cardiovascular;  Laterality: N/A;   CARDIOVERSION N/A 09/21/2019   Procedure: CARDIOVERSION;  Surgeon: Adrian Prows, MD;  Location: [facility] ENDOSCOPY;  Service: Cardiovascular;  Laterality: N/A;   CARDIOVERSION N/A 12/01/2019   Procedure: CARDIOVERSION;  Surgeon: Adrian Prows, MD;  Location: [place];  Service: Cardiovascular;  Laterality: N/A;   CARDIOVERSION N/A 08/20/2021  Procedure: CARDIOVERSION;  Surgeon: Adrian Prows, MD;  Location: [facility] ENDOSCOPY;  Service: Cardiovascular;  Laterality: N/A;   CARDIOVERSION N/A 10/18/2021   Procedure: CARDIOVERSION;  Surgeon: Adrian Prows, MD;  Location: [place];  Service: Cardiovascular;  Laterality: N/A;   CATARACT EXTRACTION W/ INTRAOCULAR LENS  IMPLANT, BILATERAL Bilateral    COLONOSCOPY      INGUINAL HERNIA REPAIR Bilateral    as child   INTRAMEDULLARY (IM) NAIL INTERTROCHANTERIC Right 12/29/2021   Procedure: INTRAMEDULLARY (IM) NAIL INTERTROCHANTRIC;  Surgeon: Erle Crocker, MD;  Location: [place];  Service: Orthopedics;  Laterality: Right;   KNEE ARTHROSCOPY Right 2004   Patient Active Problem List   Diagnosis Date Noted   Closed right hip fracture ([place]) 12/28/2021   HLD (hyperlipidemia) 12/28/2021   Change in bowel habits 12/05/2021   Generalized abdominal pain 12/05/2021   BPH with elevated PSA 12/13/2019   Dermatitis 12/13/2019   Encounter for Medicare annual wellness exam 12/13/2019   Chronic pain of right knee 10/10/2019   Acquired thrombophilia ([place]) 05/17/2019   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/06/2018   Primary osteoarthritis of right knee 08/06/2018   Pes anserinus bursitis of left knee 10/31/2016   Allergic rhinitis 02/14/2016   Hearing loss 02/13/2016   CKD (chronic kidney disease) stage 3, GFR 30-59 ml/min (HCC) 02/13/2016   Paroxysmal atrial fibrillation ([place]) 03/31/2012   Hypertension 03/31/2012   Hemorrhoids, external 05/15/2011    PCP: [place], [place] PROVIDER: Josefa Half*, Melony Overly (hip)  REFERRING DIAG: S/P R hip fracture/ IM repair 12/29/21,   S/P R TKE (uni)   THERAPY DIAG:  Pain in right hip  Pain in right thigh  Stiffness of right ankle, not elsewhere classified  Pain in right ankle and joints of right foot  Other abnormalities of gait and mobility  ONSET DATE:  Surgery date: 09/11/21 R Uni TKA   SUBJECTIVE:   SUBJECTIVE STATEMENT: Pt was having mild knee pain, at top of patella. Did see ortho w x-ray , with good report. Has most soreness in R TFL region with increased standing/walking, and initial standing.   PERTINENT HISTORY: Chronic A-Fib,   PAIN:  Are you having pain? Yes: NPRS scale: 2/10 Pain location: R knee Pain description: achey Aggravating factors:  increased activity  Relieving factors: Rest, ice   Are you having pain? Yes: NPRS scale: 0-2/10 Pain location: R hip Pain description: achey, sore  Aggravating factors: increased activity , movement  Relieving factors: Rest, ice    PRECAUTIONS: None  WEIGHT BEARING RESTRICTIONS No  FALLS:  Has patient fallen in last 6 months? Yes. Number of falls 1  PLOF: Independent  PATIENT GOALS  Decreased pain   OBJECTIVE:  updated 02/13/22   COGNITION:  Overall cognitive status: Within functional limits for tasks assessed      PALPATION: Mild soreness in proximal rec fem, prox and mid vastus lateralis, mid ITB,    LE ROM:  Hip ROM: Mild limitation  Passive ROM Right 12/12/21 R 01/01/22 R 01/30/22 R 8/31                                                    Knee flexion 120   WFL    Knee extension 0   WFL            Hip flexion  ~  90 deg [facility] WFL                     (Blank rows = not tested)   LE MMT:  R Knee: 5/5,  R hip: 4/5    TODAY'S TREATMENT:  02/20/22: Therapeutic Exercise: Aerobic: Recumbent bike L2 x 6 min Supine:    S/L:  hip abd 2x 10 on R;  Seated:   Sit to stand, slow ecc lowering x 15;  Standing:   Tandem walk 15 ft x 4;  Bwd walking 25 ft x 4; Side stepping 25 ft x 2; Hip abd 2x10 bil; Slow march x 20; tandem stance30 sec x 3 bil;  Stretches:  Standing hip flexor stretch at counter 30 sec x 2 bil;  Gait:     Neuromuscular Re-education: Manual Therapy:    02/13/22: Therapeutic Exercise: Aerobic: Recumbent bike L2 x 7 min Supine:    S/L:  Hip abd x10 on R;  Seated:  HR x 20;  Sit to stand, slow ecc lowering x 15;  Standing:    Staggered stance weight shifts x 15 bil;  HR x 20, 1 UE support;  Slow march x 20;  Stretches:  Prone hsc (for quad stretch) x 15 on L:  Gastroc stretch on step x 2 min bil;  Standing hip flexor stretch at counter 30 sec x 2 bil;  Gait:     Neuromuscular Re-education: Manual Therapy:    PATIENT EDUCATION:  Education  details:  reviewed HEP   Person educated: Patient Education method: Consulting civil engineer, Demonstration, Tactile cues, Verbal cues, and Handouts Education comprehension: verbalized understanding, returned demonstration, verbal cues required, tactile cues required, and needs further education   HOME EXERCISE PROGRAM: Access Code: [email or web address] knee Access Code: [email or web address] Access Code: [email or web address] - s/p R hip fx    ASSESSMENT:  CLINICAL IMPRESSION: 02/20/2022 Pt with continued soreness in TFL. Adductor pain seems improved. Doing well with strengthening for separate joints, but having more difficulty in standing/weight bearing with dynamic movement, still challenged with this, due to instability in LE. Pt to benefit from continued strength and stabilization.   OBJECTIVE IMPAIRMENTS Abnormal gait, decreased activity tolerance, decreased balance, decreased endurance, decreased knowledge of use of DME, decreased mobility, difficulty walking, decreased ROM, decreased strength, hypomobility, increased muscle spasms, impaired flexibility, and pain.   ACTIVITY LIMITATIONS cleaning, community activity, driving, meal prep, yard work, and shopping.   PERSONAL FACTORS  none  are also affecting patient's functional outcome.    REHAB POTENTIAL: Good  CLINICAL DECISION MAKING: Stable/uncomplicated  EVALUATION COMPLEXITY: Low   GOALS: Goals reviewed with patient? Yes  SHORT TERM GOALS: Target date: 01/15/22  Pt to be independent with initial HEP Goal status: MET   .  Pt to report pain levels 0-3/10 in R hip with activity.  Goal status: MET    LONG TERM GOALS: Target date: 03/27/2022  Pt to be independent with final HEP Goal status: IN PROGRESS  2.  Pt to report decreased pain in R knee to 0-2/10 with activity  Goal status: IN PROGRESS  3.  Pt to demo soft tissue limitations in R quad and thigh to be WNL, to improve pain and function.   Goal status: IN PROGRESS  4.  Pt to demo independent  ambulation for community distances, without pain in knee, thigh or hip , Goal status: IN PROGRESS  5.  Pt to demo improved strength of R hip to at least 4/5 to improve stability, gait, and stairs.  Goal status: In PROGRESS       PLAN: PT FREQUENCY: 1-2x/week  PT DURATION: 6 weeks  PLANNED INTERVENTIONS: Therapeutic exercises, Therapeutic activity, Neuromuscular re-education, Balance training, Gait training, Patient/Family education, Joint mobilization, Stair training, DME instructions, Dry Needling, Electrical stimulation, Spinal manipulation, Spinal mobilization, Cryotherapy, Moist heat, Taping, Vasopneumatic device, Ultrasound, Ionotophoresis 25m/ml Dexamethasone, and Manual therapy  PLAN FOR NEXT SESSION:   LLyndee Hensen PT, DPT 12:13 PM  02/20/22

## 2022-02-21 ENCOUNTER — Ambulatory Visit: Payer: Medicare Other | Admitting: Cardiology

## 2022-02-21 ENCOUNTER — Encounter: Payer: Self-pay | Admitting: Cardiology

## 2022-02-21 VITALS — BP 126/70 | HR 60 | Temp 98.2°F | Resp 16 | Ht 70.0 in | Wt 184.0 lb

## 2022-02-21 DIAGNOSIS — Z79899 Other long term (current) drug therapy: Secondary | ICD-10-CM

## 2022-02-21 DIAGNOSIS — N1832 Chronic kidney disease, stage 3b: Secondary | ICD-10-CM

## 2022-02-21 DIAGNOSIS — I10 Essential (primary) hypertension: Secondary | ICD-10-CM

## 2022-02-21 DIAGNOSIS — I480 Paroxysmal atrial fibrillation: Secondary | ICD-10-CM

## 2022-02-21 NOTE — Progress Notes (Signed)
Primary Physician/Referring:  [name]  Patient ID: ADAEL CULBREATH, male    DOB: January 27, 1941, 81 y.o.   MRN: [id number]  Dyspnea, fatigue Recurrence of atrial fibrillation  HPI:    Gregory [name]  is a 81 y.o. Caucasian male with  history of stage 3b CKD, hypertension, mild hyperlipidemia and moderate CAD by coronary CTA, paroxysmal atrial fibrillation. He has history of multiple cardioversions and eventually dofetilide was discontinued by Dr. [name] due to efficacy, and was placed on  Multaq and has mostly maintained sinus since Dec 2018. Due to recurrence of atrial fibrillation, he underwent 2nd ablation on 02/22/2019, 1st ablation being on 11/28/2016.  He had repeat cardioversion on 05/24/2019 and after Tikosyn initiation had cardioversion on 12/01/2019. He went back into atrial fibrillation about 2 weeks ago and now scheduled for cardioversion due to symptomatic AF with dyspnea and fatigue.   He presents here for a 80-monthoffice visit, underwent right hip surgery and arthroplasty on 12/29/2021 and he has recuperated fairly well.  He remains asymptomatic.  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 01/12/2020   Arthritis    "right knee" (01/28/2016)   Atypical atrial flutter ([facility] 05/17/2019   Closed right hip fracture ([place] 12/28/2021   Diverticulosis of colon (without mention of hemorrhage)    Hypertension    Internal hemorrhoids without mention of complication    Personal history of colonic polyps 01/17/2002   adenomatous, tublar adenoma 02/10/2007   PNA (pneumonia) 03/31/2012   Social History   Tobacco Use   Smoking status: Never   Smokeless tobacco: Never  Substance Use Topics   Alcohol use: Yes    Alcohol/week: 1.0 standard drink of alcohol    Types: 1 Glasses of wine per week    Comment: daily   Marital Status: Married  ROS  Review of Systems  Cardiovascular:  Negative for  chest pain, dyspnea on exertion and leg swelling.   Objective  Blood pressure 126/70, pulse 60, temperature 98.2 F (36.8 C), resp. rate 16, height _0  (1.778 m), weight 184 lb (83.5 kg), SpO2 98 %.     02/21/2022   10:57 AM 01/29/2022    9:43 AM 12/30/2021    8:01 AM  Vitals with BMI  Height _1  _2    Weight 184 lbs 182 lbs 3 oz   BMI 237.1269.67  Systolic [id number] Diastolic 70 62 63  Pulse 60 75 77     Physical Exam Constitutional:      Appearance: He is well-developed.  Neck:     Vascular: No carotid bruit or JVD.  Cardiovascular:     Rate and Rhythm: Normal rate and regular rhythm.     Pulses: Intact distal pulses.     Heart sounds: No murmur heard.    No gallop.  Pulmonary:     Effort: Pulmonary effort is normal. No accessory muscle usage or respiratory distress.     Breath sounds: Normal breath sounds.  Abdominal:     General: Bowel sounds are normal.     Palpations: Abdomen is soft.  Musculoskeletal:     Right lower leg: No edema.     Left lower leg: No edema.    Laboratory examination:   Recent Labs    12/28/21 2135 12/29/21 0341 12/30/21 0149  NA 141 140 137  K 3.8 4.5 4.7  CL 110 106 110  CO2  _0 GLUCOSE 158* 138* 177*  BUN 30* 29* 32*  CREATININE 1.98* 1.89* 2.20*  CALCIUM 8.9 8.8* 8.2*  GFRNONAA 34* 35* 30*   CrCl cannot be calculated (Patient's most recent lab result is older than the maximum 21 days allowed.).     Latest Ref Rng & Units 12/30/2021    1:49 AM 12/29/2021    3:41 AM 12/28/2021    9:35 PM  CMP  Glucose 70 - 99 mg/dL 177  138  158   BUN 8 - 23 mg/dL 32  29  30   Creatinine 0.61 - 1.24 mg/dL 2.20  1.89  1.98   Sodium 135 - 145 mmol/L 137  140  141   Potassium 3.5 - 5.1 mmol/L 4.7  4.5  3.8   Chloride 98 - 111 mmol/L 110  106  110   CO2 22 - 32 mmol/L _1 Calcium 8.9 - 10.3 mg/dL 8.2  8.8  8.9   Total Protein 6.5 - 8.1 g/dL 5.1   6.2   Total Bilirubin 0.3 - 1.2 mg/dL 0.6   0.7   Alkaline Phos 38 -  126 U/L 36   49   AST 15 - 41 U/L 12   19   ALT 0 - 44 U/L 15   20       Latest Ref Rng & Units 12/30/2021    1:49 AM 12/29/2021    3:41 AM 12/28/2021    9:35 PM  CBC  WBC 4.0 - 10.5 K/uL 6.8  6.2  8.4   Hemoglobin 13.0 - 17.0 g/dL 10.0  13.0  13.1   Hematocrit 39.0 - 52.0 % 29.8  40.3  40.4   Platelets 150 - 400 K/uL 118  152  159    Lipid Panel     Component Value Date/Time   CHOL 134 07/10/2020 0817   TRIG 56 07/10/2020 0817   HDL 50 07/10/2020 0817   LDLCALC 72 07/10/2020 0817   HEMOGLOBIN A1C No results found for: "HGBA1C", "MPG" TSH 06/18/2018: TSH 1.69.   External labs:   Labs 12/23/2021:  Total cholesterol 127, triglycerides 65, HDL 53, LDL 60.  Medications and allergies  No Known Allergies   Current Outpatient Medications:    acetaminophen (TYLENOL) 500 MG tablet, Take 1,000 mg by mouth every 6 (six) hours as needed for moderate pain or headache., Disp: , Rfl:    docusate sodium (COLACE) 100 MG capsule, Take 1 capsule (100 mg total) by mouth 2 (two) times daily. (Patient taking differently: Take 100 mg by mouth daily.), Disp: 10 capsule, Rfl: 0   finasteride (PROSCAR) 5 MG tablet, Take 5 mg by mouth daily., Disp: , Rfl:    fluticasone (FLONASE) 50 MCG/ACT nasal spray, Place 2 sprays into both nostrils daily as needed for allergies., Disp: , Rfl:    lisinopril (ZESTRIL) 5 MG tablet, TAKE 1 TABLET(5 MG) BY MOUTH DAILY, Disp: 90 tablet, Rfl: 1   Multiple Vitamins-Minerals (MULTIVITAMIN ADULTS 50+) TABS, Take 1 tablet by mouth 2 (two) times a week., Disp: , Rfl:    psyllium (METAMUCIL) 58.6 % packet, Take 1 packet by mouth daily., Disp: , Rfl:    rosuvastatin (CRESTOR) 5 MG tablet, TAKE 1 TABLET(5 MG) BY MOUTH DAILY (Patient taking differently: Take 5 mg by mouth every evening.), Disp: 90 tablet, Rfl: 3   tamsulosin (FLOMAX) 0.4 MG CAPS capsule, Take 0.8 mg by mouth every evening., Disp: , Rfl:    amiodarone (  PACERONE) 200 MG tablet, Take 0.5 tablets (100 mg total) by  mouth daily. Take twice daily for 10 days then one tab daily., Disp: 90 tablet, Rfl: 3   apixaban (ELIQUIS) 5 MG TABS tablet, Take 1 tablet (5 mg total) by mouth 2 (two) times daily., Disp: 90 tablet, Rfl: 3    Radiology:   No results found.  Cardiac Studies:   Treadmill stress test  [10/09/2014]: Indication: Atrial Fibrillation Resting EKG SR with 1st degree AV Block, LAD, LAFB, RBBB. Trifascicular block. Normal BP response. There was no ST-T changes of ischemia with exercise stress test. No stress induced arrhythmias. Stress terminated due to THR (>85% MPHR)/MPHR met. The patient exercised according to Bruce Protocol, Total time recorded 06:50 min achieving max heart rate of 158 which was 106% of MPHR for age and 8.34 METS of work.  Coronary CTA 03/06/2019: 1. Coronary artery calcium score 1837 Agatston units. This places the patient in the 86th percentile for age and gender, suggesting high risk for future cardiac events. 2. Extensive calcified plaque throughout the coronary system. Mild stenosis for the most part, possibly around 50% stenosis in the distal RCA and the proximal LCx (small vessel). However, difficult to quantify given extensive calcification with possibility of blooming artifact. Will send for FFR. 3.  Pulmonary veins without stenosis.  4.  No LA appendage thrombus seen.  FFR 0.9 mid LAD, FFR 0.93 distal RCA, FFR 0.94 mid LCx: No evidence for hemodynamically significant stenosis.  Echocardiogram 09/29/2019:  Left ventricle cavity is normal in size. Mild concentric hypertrophy of the left ventricle. Normal global wall motion.  Normal LV systolic function  with EF 61%. Indeterminate diastolic filling pattern.  Left atrial cavity is severely dilated.  Moderate (Grade II) mitral regurgitation.  Moderate tricuspid regurgitation. Estimated pulmonary artery systolic pressure is 34 mmHg.  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  EKG:  EKG 02/21/2022: Sinus rhythm  with first-degree AV block at rate of 56 bpm, otherwise normal EKG.  Normal QT interval.  No change from 01/30/2022 and 07/08/2021.  Assessment     ICD-10-CM   1. Paroxysmal atrial fibrillation (HCC)  I48.0 EKG 12-Lead    amiodarone (PACERONE) 200 MG tablet    apixaban (ELIQUIS) 5 MG TABS tablet    2. Essential hypertension  I10     3. Stage 3b chronic kidney disease (HCC)  A56.97 Basic metabolic panel    CBC    4. High risk medication use  Z79.899      CHA2DS2-VASc Score is 4.  Yearly risk of stroke: 5% (A, HTN, CAD).  Score of 1=0.6; 2=2.2; 3=3.2; 4=4.8; 5=7.2; 6=9.8; 7=>9.8) -(CHF; HTN; vasc disease DM,  Male = 1; Age <65 =0; 65-74 = 1,  >75 =2; stroke/embolism= 2).    Medications Discontinued During This Encounter  Medication Reason   amiodarone (PACERONE) 200 MG tablet Reorder   apixaban (ELIQUIS) 2.5 MG TABS tablet Reorder     No orders of the defined types were placed in this encounter.     Recommendations:   JAVYN HAVLIN  is a 81 y.o. Caucasian male with  history of stage 3b CKD, hypertension, mild hyperlipidemia and moderate CAD by coronary CTA, paroxysmal atrial fibrillation. He has history of multiple cardioversions and eventually dofetilide was discontinued by Dr. [name] due to efficacy, and was placed on  Multaq and has mostly maintained sinus since Dec 2018. Due to recurrence of atrial fibrillation, he underwent 2nd ablation on 02/22/2019, 1st ablation  being on 11/28/2016.  He had repeat cardioversion on 05/24/2019 and after Tikosyn initiation and again on 12/01/2019 & 10/18/21.  He presents here for a 27-monthoffice visit, underwent right hip surgery and arthroplasty on 12/29/2021 and he has recuperated fairly well.  Reviewed his labs, he was anemic and also his renal function had mildly deteriorated at that time, will repeat CBC and a BMP today.  We will increase his Eliquis back to 5 mg daily.  Also he is presently on amiodarone and is maintaining sinus rhythm,  QT interval is normal, will reduce the dose from 200 mg to 100 mg daily.  He would be a good candidate for OCEANIC-AF (Asundexian - factor XIa inhibitor PO BID vs Apixaban PO BID in patients with A. Fib for stroke prevention.   Otherwise blood pressure is well controlled, no clinical evidence of heart failure, I will see him back in 6 months or sooner if problems.    JAdrian Prows MD, FOptima Ophthalmic Medical Associates Inc9/01/2022, 11:19 AM Office: [phone]: [phone]

## 2022-02-23 ENCOUNTER — Encounter: Payer: Self-pay | Admitting: Cardiology

## 2022-02-24 NOTE — Telephone Encounter (Signed)
From patient.

## 2022-02-26 LAB — CBC
Hematocrit: 40.4 % (ref 37.5–51.0)
Hemoglobin: 12.9 g/dL — ABNORMAL LOW (ref 13.0–17.7)
MCH: 33.1 pg — ABNORMAL HIGH (ref 26.6–33.0)
MCHC: 31.9 g/dL (ref 31.5–35.7)
MCV: 104 fL — ABNORMAL HIGH (ref 79–97)
Platelets: 188 10*3/uL (ref 150–450)
RBC: 3.9 x10E6/uL — ABNORMAL LOW (ref 4.14–5.80)
RDW: 12.7 % (ref 11.6–15.4)
WBC: 5.1 10*3/uL (ref 3.4–10.8)

## 2022-02-26 LAB — BASIC METABOLIC PANEL
BUN/Creatinine Ratio: 14 (ref 10–24)
BUN: 25 mg/dL (ref 8–27)
CO2: 21 mmol/L (ref 20–29)
Calcium: 9.2 mg/dL (ref 8.6–10.2)
Chloride: 105 mmol/L (ref 96–106)
Creatinine, Ser: 1.75 mg/dL — ABNORMAL HIGH (ref 0.76–1.27)
Glucose: 101 mg/dL — ABNORMAL HIGH (ref 70–99)
Potassium: 5 mmol/L (ref 3.5–5.2)
Sodium: 142 mmol/L (ref 134–144)
eGFR: 39 mL/min/{1.73_m2} — ABNORMAL LOW (ref >59)

## 2022-02-27 ENCOUNTER — Other Ambulatory Visit: Payer: Self-pay | Admitting: Cardiology

## 2022-02-27 ENCOUNTER — Ambulatory Visit (INDEPENDENT_AMBULATORY_CARE_PROVIDER_SITE_OTHER): Payer: Medicare Other | Admitting: Physical Therapy

## 2022-02-27 ENCOUNTER — Encounter: Payer: Self-pay | Admitting: Physical Therapy

## 2022-02-27 DIAGNOSIS — M25551 Pain in right hip: Secondary | ICD-10-CM

## 2022-02-27 DIAGNOSIS — M25571 Pain in right ankle and joints of right foot: Secondary | ICD-10-CM | POA: Diagnosis not present

## 2022-02-27 DIAGNOSIS — I2510 Atherosclerotic heart disease of native coronary artery without angina pectoris: Secondary | ICD-10-CM

## 2022-02-27 DIAGNOSIS — M79651 Pain in right thigh: Secondary | ICD-10-CM | POA: Diagnosis not present

## 2022-02-27 DIAGNOSIS — M25671 Stiffness of right ankle, not elsewhere classified: Secondary | ICD-10-CM

## 2022-02-27 DIAGNOSIS — R2689 Other abnormalities of gait and mobility: Secondary | ICD-10-CM

## 2022-02-27 DIAGNOSIS — E7800 Pure hypercholesterolemia, unspecified: Secondary | ICD-10-CM

## 2022-02-27 NOTE — Therapy (Signed)
OUTPATIENT PHYSICAL THERAPY TREATMENT   Patient Name: Gregory [name] MRN: [id number] DOB:Jan 19, 1941, 81 y.o., male Today's Date: 02/27/2022    PT End of Session - 02/27/22 1138     Visit Number 15    Number of Visits 16    Date for PT Re-Evaluation 03/27/22    Authorization Type Medicare,  Re-eval at visit 3, re-cert at visit 60.    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 01/12/2020   Arthritis    "right knee" (01/28/2016)   Atypical atrial flutter ([place]) 05/17/2019   Closed right hip fracture ([place]) 12/28/2021   Diverticulosis of colon (without mention of hemorrhage)    Hypertension    Internal hemorrhoids without mention of complication    Personal history of colonic polyps 01/17/2002   adenomatous, tublar adenoma 02/10/2007   PNA (pneumonia) 03/31/2012   Past Surgical History:  Procedure Laterality Date   ATRIAL FIBRILLATION ABLATION  11/28/2016   ATRIAL FIBRILLATION ABLATION N/A 11/28/2016   Procedure: Atrial Fibrillation Ablation;  Surgeon: Constance Haw, MD;  Location: [place] CV [facility];  Service: Cardiovascular;  Laterality: N/A;   ATRIAL FIBRILLATION ABLATION N/A 03/18/2019   Procedure: ATRIAL FIBRILLATION ABLATION;  Surgeon: Constance Haw, MD;  Location: [place] [facility];  Service: Cardiovascular;  Laterality: N/A;   CARDIOVERSION  05/03/2012   Procedure: CARDIOVERSION;  Surgeon: Laverda Page, MD;  Location: [place];  Service: Cardiovascular;  Laterality: N/A;   CARDIOVERSION  06/08/2012   Procedure: CARDIOVERSION;  Surgeon: Laverda Page, MD;  Location: [place];  Service: Cardiovascular;  Laterality: N/A;  [name]   CARDIOVERSION N/A  10/28/2013   Procedure: CARDIOVERSION;  Surgeon: Laverda Page, MD;  Location: [place];  Service: Cardiovascular;  Laterality: N/A;   CARDIOVERSION N/A 06/30/2014   Procedure: CARDIOVERSION;  Surgeon: Laverda Page, MD;  Location: [place];  Service: Cardiovascular;  Laterality: N/A;  H&P in file   CARDIOVERSION N/A 09/05/2014   Procedure: CARDIOVERSION;  Surgeon: Adrian Prows, MD;  Location: [place];  Service: Cardiovascular;  Laterality: N/A;   CARDIOVERSION N/A 12/24/2015   Procedure: CARDIOVERSION;  Surgeon: Adrian Prows, MD;  Location: [place];  Service: Cardiovascular;  Laterality: N/A;   CARDIOVERSION N/A 01/30/2016   Procedure: CARDIOVERSION;  Surgeon: Adrian Prows, MD;  Location: [place];  Service: Cardiovascular;  Laterality: N/A;   CARDIOVERSION N/A 02/08/2016   Procedure: CARDIOVERSION;  Surgeon: Adrian Prows, MD;  Location: [place];  Service: Cardiovascular;  Laterality: N/A;   CARDIOVERSION N/A 02/08/2019   Procedure: CARDIOVERSION;  Surgeon: Adrian Prows, MD;  Location: [place];  Service: Cardiovascular;  Laterality: N/A;   CARDIOVERSION N/A 05/24/2019   Procedure: CARDIOVERSION;  Surgeon: Elouise Munroe, MD;  Location: [facility] ENDOSCOPY;  Service: Cardiovascular;  Laterality: N/A;   CARDIOVERSION N/A 09/21/2019   Procedure: CARDIOVERSION;  Surgeon: Adrian Prows, MD;  Location[facility] ENDOSCOPY;  Service: Cardiovascular;  Laterality: N/A;   CARDIOVERSION N/A 12/01/2019   Procedure: CARDIOVERSION;  Surgeon: Adrian Prows, MD;  Location: [place];  Service: Cardiovascular;  Laterality: N/A;   CARDIOVERSION N/A 08/20/2021  Procedure: CARDIOVERSION;  Surgeon: Adrian Prows, MD;  Location: [facility] ENDOSCOPY;  Service: Cardiovascular;  Laterality: N/A;   CARDIOVERSION N/A 10/18/2021   Procedure: CARDIOVERSION;  Surgeon: Adrian Prows, MD;  Location: [place];  Service: Cardiovascular;  Laterality: N/A;   CATARACT EXTRACTION W/ INTRAOCULAR LENS  IMPLANT, BILATERAL Bilateral     COLONOSCOPY     INGUINAL HERNIA REPAIR Bilateral    as child   INTRAMEDULLARY (IM) NAIL INTERTROCHANTERIC Right 12/29/2021   Procedure: INTRAMEDULLARY (IM) NAIL INTERTROCHANTRIC;  Surgeon: Erle Crocker, MD;  Location: [place];  Service: Orthopedics;  Laterality: Right;   KNEE ARTHROSCOPY Right 2004   Patient Active Problem List   Diagnosis Date Noted   HLD (hyperlipidemia) 12/28/2021   BPH with elevated PSA 12/13/2019   Dermatitis 12/13/2019   Encounter for Medicare annual wellness exam 12/13/2019   Chronic pain of right knee 10/10/2019   Acquired thrombophilia ([place]) 05/17/2019   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/06/2018   Primary osteoarthritis of right knee 08/06/2018   Pes anserinus bursitis of left knee 10/31/2016   Allergic rhinitis 02/14/2016   Hearing loss 02/13/2016   CKD (chronic kidney disease) stage 3, GFR 30-59 ml/min (HCC) 02/13/2016   Paroxysmal atrial fibrillation ([place]) 03/31/2012   Primary hypertension 03/31/2012   Hemorrhoids, external 05/15/2011    PCP: [place], [place] PROVIDER: Josefa Half*, Melony Overly (hip)  REFERRING DIAG: S/P R hip fracture/ IM repair 12/29/21,   S/P R TKE (uni)   THERAPY DIAG:  Pain in right hip  Pain in right thigh  Stiffness of right ankle, not elsewhere classified  Pain in right ankle and joints of right foot  Other abnormalities of gait and mobility  ONSET DATE:  Surgery date: 09/11/21 R Uni TKA   SUBJECTIVE:   SUBJECTIVE STATEMENT: Pt was having mild knee pain, at top of patella. Did see ortho w x-ray , with good report. Has most soreness in R TFL region with increased standing/walking, and initial standing.   PERTINENT HISTORY: Chronic A-Fib,   PAIN:  Are you having pain? Yes: NPRS scale: 2/10 Pain location: R knee Pain description: achey Aggravating factors: increased activity  Relieving factors: Rest, ice   Are you having pain? Yes: NPRS scale: 0-2/10 Pain  location: R hip Pain description: achey, sore  Aggravating factors: increased activity , movement  Relieving factors: Rest, ice    PRECAUTIONS: None  WEIGHT BEARING RESTRICTIONS No  FALLS:  Has patient fallen in last 6 months? Yes. Number of falls 1  PLOF: Independent  PATIENT GOALS  Decreased pain   OBJECTIVE:  updated 02/13/22   COGNITION:  Overall cognitive status: Within functional limits for tasks assessed      PALPATION: Mild soreness in proximal rec fem, prox and mid vastus lateralis, mid ITB,    LE ROM:  Hip ROM: Mild limitation  Passive ROM Right 12/12/21 R 01/01/22 R 01/30/22 R 8/31                                                    Knee flexion 120   WFL    Knee extension 0   WFL            Hip flexion  ~90 deg [facility] WFL                     (  Blank rows = not tested)   LE MMT:  R Knee: 5/5,  R hip: 4/5    TODAY'S TREATMENT:  02/27/2022  Therapeutic Exercise: Aerobic: Recumbent bike L2 x 6 min Supine:    S/L:  hip abd 2x 10 on R;  Seated:   Standing:     Bwd walking 25 ft x 4;  Hip abd 2x10 bil; Slow march x 20; Staggered stance weight shifts 2x20 on R, practice for more anterior weight shift over R foot;  Squats at mat table x 20;  Stretches:  Standing hip flexor stretch at counter 30 sec x 2 bil; Gastroc stretch on step x 1 min;  Gait:     Neuromuscular Re-education: Manual Therapy: STM/Tennis ball to R TFL and proximal quad.    02/20/22: Therapeutic Exercise: Aerobic: Recumbent bike L2 x 6 min Supine:    S/L:  hip abd 2x 10 on R;  Seated:   Sit to stand, slow ecc lowering x 15;  Standing:   Tandem walk 15 ft x 4;  Bwd walking 25 ft x 4; Side stepping 25 ft x 2; Hip abd 2x10 bil; Slow march x 20; tandem stance30 sec x 3 bil;  Stretches:  Standing hip flexor stretch at counter 30 sec x 2 bil;  Gait:     Neuromuscular Re-education: Manual Therapy:    02/13/22: Therapeutic Exercise: Aerobic: Recumbent bike L2 x 7 min Supine:    S/L:   Hip abd x10 on R;  Seated:  HR x 20;  Sit to stand, slow ecc lowering x 15;  Standing:    Staggered stance weight shifts x 15 bil;  HR x 20, 1 UE support;  Slow march x 20;  Stretches:  Prone hsc (for quad stretch) x 15 on L:  Gastroc stretch on step x 2 min bil;  Standing hip flexor stretch at counter 30 sec x 2 bil;  Gait:     Neuromuscular Re-education: Manual Therapy:    PATIENT EDUCATION:  Education details:  reviewed HEP   Person educated: Patient Education method: Consulting civil engineer, Demonstration, Tactile cues, Verbal cues, and Handouts Education comprehension: verbalized understanding, returned demonstration, verbal cues required, tactile cues required, and needs further education   HOME EXERCISE PROGRAM: Access Code: [email or web address] Access Code: [email or web address] Access Code: [email or web address] - s/p R hip fx    ASSESSMENT:  CLINICAL IMPRESSION: 02/27/2022 Pt with mild decrease in soreness in R TFL. Improved ability for SLS phase of exercises today, with weight shifts, marching. Improved speed and stability with gait today. Pt progressing well. Still having stiffness/pain with initial standing in R hip, as well as soreness in anterior hip with increased activity. Will benefit from continued strength and stability, progressive SL stability.   OBJECTIVE IMPAIRMENTS Abnormal gait, decreased activity tolerance, decreased balance, decreased endurance, decreased knowledge of use of DME, decreased mobility, difficulty walking, decreased ROM, decreased strength, hypomobility, increased muscle spasms, impaired flexibility, and pain.   ACTIVITY LIMITATIONS cleaning, community activity, driving, meal prep, yard work, and shopping.   PERSONAL FACTORS  none  are also affecting patient's functional outcome.    REHAB POTENTIAL: Good  CLINICAL DECISION MAKING: Stable/uncomplicated  EVALUATION COMPLEXITY: Low   GOALS: Goals reviewed with patient? Yes  SHORT TERM GOALS: Target date: 01/15/22  Pt to  be independent with initial HEP Goal status: MET   .  Pt to report pain levels 0-3/10 in R hip with activity.  Goal status: MET    LONG TERM GOALS: Target date: 03/27/2022  Pt to be independent with final HEP Goal status: IN PROGRESS  2.  Pt to report decreased pain in R knee to 0-2/10 with activity  Goal status: IN PROGRESS  3.  Pt to demo soft tissue limitations in R quad and thigh to be WNL, to improve pain and function.   Goal status: IN PROGRESS  4.  Pt to demo independent ambulation for community distances, without pain in knee, thigh or hip , Goal status: IN PROGRESS  5.  Pt to demo improved strength of R hip to at least 4/5 to improve stability, gait, and stairs.   Goal status: In PROGRESS       PLAN: PT FREQUENCY: 1-2x/week  PT DURATION: 6 weeks  PLANNED INTERVENTIONS: Therapeutic exercises, Therapeutic activity, Neuromuscular re-education, Balance training, Gait training, Patient/Family education, Joint mobilization, Stair training, DME instructions, Dry Needling, Electrical stimulation, Spinal manipulation, Spinal mobilization, Cryotherapy, Moist heat, Taping, Vasopneumatic device, Ultrasound, Ionotophoresis 4m/ml Dexamethasone, and Manual therapy  PLAN FOR NEXT SESSION:   LLyndee Hensen PT, DPT 11:46 AM  02/27/22

## 2022-03-04 ENCOUNTER — Encounter: Payer: Self-pay | Admitting: Cardiology

## 2022-03-04 NOTE — Telephone Encounter (Signed)
From patient.

## 2022-03-06 ENCOUNTER — Ambulatory Visit (INDEPENDENT_AMBULATORY_CARE_PROVIDER_SITE_OTHER): Payer: Medicare Other | Admitting: Physical Therapy

## 2022-03-06 ENCOUNTER — Encounter: Payer: Self-pay | Admitting: Physical Therapy

## 2022-03-06 DIAGNOSIS — M25671 Stiffness of right ankle, not elsewhere classified: Secondary | ICD-10-CM | POA: Diagnosis not present

## 2022-03-06 DIAGNOSIS — M79651 Pain in right thigh: Secondary | ICD-10-CM | POA: Diagnosis not present

## 2022-03-06 DIAGNOSIS — M25571 Pain in right ankle and joints of right foot: Secondary | ICD-10-CM

## 2022-03-06 DIAGNOSIS — M25551 Pain in right hip: Secondary | ICD-10-CM | POA: Diagnosis not present

## 2022-03-06 DIAGNOSIS — R2689 Other abnormalities of gait and mobility: Secondary | ICD-10-CM

## 2022-03-06 NOTE — Therapy (Signed)
OUTPATIENT PHYSICAL THERAPY TREATMENT   Patient Name: Gregory [name] MRN: [id number] DOB:October 04, 1940, 81 y.o., male Today's Date: 02/27/2022    PT End of Session - 03/06/22 1345     Visit Number 16    Date for PT Re-Evaluation 03/27/22    Authorization Type Medicare,  Re-eval at visit 3, re-cert at visit 42.    PT Start Time 1346    PT Stop Time 1425    PT Time Calculation (min) 39 min    Activity Tolerance Patient tolerated treatment well    Behavior During Therapy 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 01/12/2020   Arthritis    "right knee" (01/28/2016)   Atypical atrial flutter ([place]) 05/17/2019   Closed right hip fracture ([place]) 12/28/2021   Diverticulosis of colon (without mention of hemorrhage)    Hypertension    Internal hemorrhoids without mention of complication    Personal history of colonic polyps 01/17/2002   adenomatous, tublar adenoma 02/10/2007   PNA (pneumonia) 03/31/2012   Past Surgical History:  Procedure Laterality Date   ATRIAL FIBRILLATION ABLATION  11/28/2016   ATRIAL FIBRILLATION ABLATION N/A 11/28/2016   Procedure: Atrial Fibrillation Ablation;  Surgeon: Constance Haw, MD;  Location: [place] CV [facility];  Service: Cardiovascular;  Laterality: N/A;   ATRIAL FIBRILLATION ABLATION N/A 03/18/2019   Procedure: ATRIAL FIBRILLATION ABLATION;  Surgeon: Constance Haw, MD;  Location: [place] CV [facility];  Service: Cardiovascular;  Laterality: N/A;   CARDIOVERSION  05/03/2012   Procedure: CARDIOVERSION;  Surgeon: Laverda Page, MD;  Location: [place];  Service: Cardiovascular;  Laterality: N/A;   CARDIOVERSION  06/08/2012   Procedure: CARDIOVERSION;  Surgeon: Laverda Page, MD;  Location: [place];  Service: Cardiovascular;  Laterality: N/A;  [name]   CARDIOVERSION N/A 10/28/2013   Procedure:  CARDIOVERSION;  Surgeon: Laverda Page, MD;  Location: [place];  Service: Cardiovascular;  Laterality: N/A;   CARDIOVERSION N/A 06/30/2014   Procedure: CARDIOVERSION;  Surgeon: Laverda Page, MD;  Location: [place];  Service: Cardiovascular;  Laterality: N/A;  H&P in file   CARDIOVERSION N/A 09/05/2014   Procedure: CARDIOVERSION;  Surgeon: Adrian Prows, MD;  Location: [place];  Service: Cardiovascular;  Laterality: N/A;   CARDIOVERSION N/A 12/24/2015   Procedure: CARDIOVERSION;  Surgeon: Adrian Prows, MD;  Location: [place];  Service: Cardiovascular;  Laterality: N/A;   CARDIOVERSION N/A 01/30/2016   Procedure: CARDIOVERSION;  Surgeon: Adrian Prows, MD;  Location: [place];  Service: Cardiovascular;  Laterality: N/A;   CARDIOVERSION N/A 02/08/2016   Procedure: CARDIOVERSION;  Surgeon: Adrian Prows, MD;  Location: [place];  Service: Cardiovascular;  Laterality: N/A;   CARDIOVERSION N/A 02/08/2019   Procedure: CARDIOVERSION;  Surgeon: Adrian Prows, MD;  Location: [place];  Service: Cardiovascular;  Laterality: N/A;   CARDIOVERSION N/A 05/24/2019   Procedure: CARDIOVERSION;  Surgeon: Elouise Munroe, MD;  Location: [facility] ENDOSCOPY;  Service: Cardiovascular;  Laterality: N/A;   CARDIOVERSION N/A 09/21/2019   Procedure: CARDIOVERSION;  Surgeon: Adrian Prows, MD;  Location: [facility] ENDOSCOPY;  Service: Cardiovascular;  Laterality: N/A;   CARDIOVERSION N/A 12/01/2019   Procedure: CARDIOVERSION;  Surgeon: Adrian Prows, MD;  Location: [place];  Service: Cardiovascular;  Laterality: N/A;   CARDIOVERSION N/A 08/20/2021   Procedure: CARDIOVERSION;  Surgeon: Einar Gip,  Ulice Dash, MD;  Location: [facility] ENDOSCOPY;  Service: Cardiovascular;  Laterality: N/A;   CARDIOVERSION N/A 10/18/2021   Procedure: CARDIOVERSION;  Surgeon: Adrian Prows, MD;  Location: [place];  Service: Cardiovascular;  Laterality: N/A;   CATARACT EXTRACTION W/ INTRAOCULAR LENS  IMPLANT, BILATERAL Bilateral    COLONOSCOPY     INGUINAL  HERNIA REPAIR Bilateral    as child   INTRAMEDULLARY (IM) NAIL INTERTROCHANTERIC Right 12/29/2021   Procedure: INTRAMEDULLARY (IM) NAIL INTERTROCHANTRIC;  Surgeon: Erle Crocker, MD;  Location: [place];  Service: Orthopedics;  Laterality: Right;   KNEE ARTHROSCOPY Right 2004   Patient Active Problem List   Diagnosis Date Noted   HLD (hyperlipidemia) 12/28/2021   BPH with elevated PSA 12/13/2019   Dermatitis 12/13/2019   Encounter for Medicare annual wellness exam 12/13/2019   Chronic pain of right knee 10/10/2019   Acquired thrombophilia ([place]) 05/17/2019   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/06/2018   Primary osteoarthritis of right knee 08/06/2018   Pes anserinus bursitis of left knee 10/31/2016   Allergic rhinitis 02/14/2016   Hearing loss 02/13/2016   CKD (chronic kidney disease) stage 3, GFR 30-59 ml/min (HCC) 02/13/2016   Paroxysmal atrial fibrillation ([place]) 03/31/2012   Primary hypertension 03/31/2012   Hemorrhoids, external 05/15/2011    PCP: [place], [place] PROVIDER: Josefa Half*, Melony Overly (hip)  REFERRING DIAG: S/P R hip fracture/ IM repair 12/29/21,   S/P R TKE (uni)   THERAPY DIAG:  Pain in right hip  Pain in right thigh  Stiffness of right ankle, not elsewhere classified  Pain in right ankle and joints of right foot  Other abnormalities of gait and mobility  ONSET DATE:  Surgery date: 09/11/21 R Uni TKA   SUBJECTIVE:   SUBJECTIVE STATEMENT: States he is still having a little bit of knee pain but its not that bad right now. States he has stiffness on the front of the right hip.  PERTINENT HISTORY: Chronic A-Fib,   PAIN:  Are you having pain? Yes: NPRS scale: 0/10 Pain location: R knee Pain description: achey Aggravating factors: increased activity  Relieving factors: Rest, ice   Are you having pain? Yes: NPRS scale: 0-2/10 Pain location: R hip Pain description: achey, sore  Aggravating  factors: increased activity , movement  Relieving factors: Rest, ice    PRECAUTIONS: None  WEIGHT BEARING RESTRICTIONS No  FALLS:  Has patient fallen in last 6 months? Yes. Number of falls 1  PLOF: Independent  PATIENT GOALS  Decreased pain   OBJECTIVE:  updated 02/13/22   COGNITION:  Overall cognitive status: Within functional limits for tasks assessed      PALPATION: Mild soreness in proximal rec fem, prox and mid vastus lateralis, mid ITB,    LE ROM:  Hip ROM: Mild limitation  Passive ROM Right 12/12/21 R 01/01/22 R 01/30/22 R 8/31                                                    Knee flexion 120   WFL    Knee extension 0   WFL            Hip flexion  ~90 deg WFL WFL                     (Blank rows = not tested)  LE MMT:  R Knee: 5/5,  R hip: 4/5    TODAY'S TREATMENT:  03/06/2022  Therapeutic Exercise: Aerobic: Recumbent bike L2 x 5 min Supine:    S/L:  hip abd 2x 10 on R;  Seated:   Standing:    hip flexor stretch at counter with glute squeeze 5" holds 2 minutes total R leg, lateral step ups 1 hand support 4" height x20 R, backwards and forwards walking with anterior pull green band x10 B, lateral stepping with lateral pull green band 2x5 B, backwards walking x5 laps of 20 feet, step up with contralateral knee drive 3x5 B with B UE Support, lumbar extension at wall x10   Stretches:   Gait:     Neuromuscular Re-education: Manual Therapy:     02/20/22: Therapeutic Exercise: Aerobic: Recumbent bike L2 x 6 min Supine:    S/L:  hip abd 2x 10 on R;  Seated:   Sit to stand, slow ecc lowering x 15;  Standing:   Tandem walk 15 ft x 4;  Bwd walking 25 ft x 4; Side stepping 25 ft x 2; Hip abd 2x10 bil; Slow march x 20; tandem stance30 sec x 3 bil;  Stretches:  Standing hip flexor stretch at counter 30 sec x 2 bil;  Gait:     Neuromuscular Re-education: Manual Therapy:    02/13/22: Therapeutic Exercise: Aerobic: Recumbent bike L2 x 7 min Supine:     S/L:  Hip abd x10 on R;  Seated:  HR x 20;  Sit to stand, slow ecc lowering x 15;  Standing:    Staggered stance weight shifts x 15 bil;  HR x 20, 1 UE support;  Slow march x 20;  Stretches:  Prone hsc (for quad stretch) x 15 on L:  Gastroc stretch on step x 2 min bil;  Standing hip flexor stretch at counter 30 sec x 2 bil;  Gait:     Neuromuscular Re-education: Manual Therapy:    PATIENT EDUCATION:  Education details:  reviewed HEP   Person educated: Patient Education method: Consulting civil engineer, Demonstration, Tactile cues, Verbal cues, and Handouts Education comprehension: verbalized understanding, returned demonstration, verbal cues required, tactile cues required, and needs further education   HOME EXERCISE PROGRAM: Access Code: [email or web address] knee Access Code: [email or web address] Access Code: [email or web address] - s/p R hip fx    ASSESSMENT:  CLINICAL IMPRESSION: 03/06/2022 Continued with standing balance and endurance exercises. Added stepping with anchored pull and this was initially challenging requiring min assist for balance 1x the first set on each side. Improved ability to control change in resistance with stepping. Overall patent is doing well, no pain noted end of session just mild soreness along front of right hip near hip flexor, this improved with lumbar extension stretch. Will continue with current POC as tolerated.   OBJECTIVE IMPAIRMENTS Abnormal gait, decreased activity tolerance, decreased balance, decreased endurance, decreased knowledge of use of DME, decreased mobility, difficulty walking, decreased ROM, decreased strength, hypomobility, increased muscle spasms, impaired flexibility, and pain.   ACTIVITY LIMITATIONS cleaning, community activity, driving, meal prep, yard work, and shopping.   PERSONAL FACTORS  none  are also affecting patient's functional outcome.    REHAB POTENTIAL: Good  CLINICAL DECISION MAKING: Stable/uncomplicated  EVALUATION COMPLEXITY:  Low   GOALS: Goals reviewed with patient? Yes  SHORT TERM GOALS: Target date: 01/15/22  Pt to be independent with initial HEP Goal status: MET   .  Pt to report pain levels 0-3/10 in R hip with activity.  Goal status: MET    LONG TERM GOALS: Target date: 03/27/2022  Pt to be independent with final HEP Goal status: IN PROGRESS  2.  Pt to report decreased pain in R knee to 0-2/10 with activity  Goal status: IN PROGRESS  3.  Pt to demo soft tissue limitations in R quad and thigh to be WNL, to improve pain and function.   Goal status: IN PROGRESS  4.  Pt to demo independent ambulation for community distances, without pain in knee, thigh or hip , Goal status: IN PROGRESS  5.  Pt to demo improved strength of R hip to at least 4/5 to improve stability, gait, and stairs.   Goal status: In PROGRESS       PLAN: PT FREQUENCY: 1-2x/week  PT DURATION: 6 weeks  PLANNED INTERVENTIONS: Therapeutic exercises, Therapeutic activity, Neuromuscular re-education, Balance training, Gait training, Patient/Family education, Joint mobilization, Stair training, DME instructions, Dry Needling, Electrical stimulation, Spinal manipulation, Spinal mobilization, Cryotherapy, Moist heat, Taping, Vasopneumatic device, Ultrasound, Ionotophoresis 25m/ml Dexamethasone, and Manual therapy  PLAN FOR NEXT SESSION:   2:27 PM, 03/06/22 MJerene Pitch DPT Physical Therapy with CRoyston Sinner

## 2022-03-07 ENCOUNTER — Telehealth: Payer: Self-pay | Admitting: Gastroenterology

## 2022-03-07 NOTE — Telephone Encounter (Signed)
Returned call to patient. Pt states that he his bowel habits were doing well for the last couple of months. Pt states that for the last week he has been passing small, skinny stools. Pt has been taking Metamucil daily and one dose of Miralax daily. Pt has been advised that if he does not feel like he is emptying completely he can increase Miralax up to 3 doses a day. I advised patient to try this for the next week and she how he does. Pt will call back if no improvement in symptoms. Pt verbalized understanding and had no concerns at the end of the call.

## 2022-03-07 NOTE — Telephone Encounter (Signed)
Inbound call from patient in regarding to an ov he had with provider on 6/22. Patient would like to discuss changes that he did not disclose with me. Please give a call to further advise.  Thank you

## 2022-03-20 ENCOUNTER — Encounter: Payer: Self-pay | Admitting: Physical Therapy

## 2022-03-20 ENCOUNTER — Ambulatory Visit (INDEPENDENT_AMBULATORY_CARE_PROVIDER_SITE_OTHER): Payer: Medicare Other | Admitting: Physical Therapy

## 2022-03-20 DIAGNOSIS — M25571 Pain in right ankle and joints of right foot: Secondary | ICD-10-CM

## 2022-03-20 DIAGNOSIS — M25551 Pain in right hip: Secondary | ICD-10-CM | POA: Diagnosis not present

## 2022-03-20 DIAGNOSIS — M79651 Pain in right thigh: Secondary | ICD-10-CM | POA: Diagnosis not present

## 2022-03-20 DIAGNOSIS — M25671 Stiffness of right ankle, not elsewhere classified: Secondary | ICD-10-CM | POA: Diagnosis not present

## 2022-03-20 DIAGNOSIS — R2689 Other abnormalities of gait and mobility: Secondary | ICD-10-CM

## 2022-03-20 NOTE — Therapy (Signed)
OUTPATIENT PHYSICAL THERAPY TREATMENT   Patient Name: Gregory [name] MRN: [id number] DOB:01-17-1941, 81 y.o., male Today's Date: 10/5/ 2023    PT End of Session - 03/20/22 1119     Visit Number 17    Date for PT Re-Evaluation 03/27/22    Authorization Type Medicare,  Re-eval at visit 3, re-cert at visit 31.    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 01/12/2020   Arthritis    "right knee" (01/28/2016)   Atypical atrial flutter ([place]) 05/17/2019   Closed right hip fracture ([place]) 12/28/2021   Diverticulosis of colon (without mention of hemorrhage)    Hypertension    Internal hemorrhoids without mention of complication    Personal history of colonic polyps 01/17/2002   adenomatous, tublar adenoma 02/10/2007   PNA (pneumonia) 03/31/2012   Past Surgical History:  Procedure Laterality Date   ATRIAL FIBRILLATION ABLATION  11/28/2016   ATRIAL FIBRILLATION ABLATION N/A 11/28/2016   Procedure: Atrial Fibrillation Ablation;  Surgeon: Constance Haw, MD;  Location: [place] [facility];  Service: Cardiovascular;  Laterality: N/A;   ATRIAL FIBRILLATION ABLATION N/A 03/18/2019   Procedure: ATRIAL FIBRILLATION ABLATION;  Surgeon: Constance Haw, MD;  Location: [place] [facility];  Service: Cardiovascular;  Laterality: N/A;   CARDIOVERSION  05/03/2012   Procedure: CARDIOVERSION;  Surgeon: Laverda Page, MD;  Location: [place];  Service: Cardiovascular;  Laterality: N/A;   CARDIOVERSION  06/08/2012   Procedure: CARDIOVERSION;  Surgeon: Laverda Page, MD;  Location: [place];  Service: Cardiovascular;  Laterality: N/A;  [name]   CARDIOVERSION N/A 10/28/2013   Procedure:  CARDIOVERSION;  Surgeon: Laverda Page, MD;  Location: [place];  Service: Cardiovascular;  Laterality: N/A;   CARDIOVERSION N/A 06/30/2014   Procedure: CARDIOVERSION;  Surgeon: Laverda Page, MD;  Location: [place];  Service: Cardiovascular;  Laterality: N/A;  H&P in file   CARDIOVERSION N/A 09/05/2014   Procedure: CARDIOVERSION;  Surgeon: Adrian Prows, MD;  Location: [place];  Service: Cardiovascular;  Laterality: N/A;   CARDIOVERSION N/A 12/24/2015   Procedure: CARDIOVERSION;  Surgeon: Adrian Prows, MD;  Location: [place];  Service: Cardiovascular;  Laterality: N/A;   CARDIOVERSION N/A 01/30/2016   Procedure: CARDIOVERSION;  Surgeon: Adrian Prows, MD;  Location: [place];  Service: Cardiovascular;  Laterality: N/A;   CARDIOVERSION N/A 02/08/2016   Procedure: CARDIOVERSION;  Surgeon: Adrian Prows, MD;  Location: [place];  Service: Cardiovascular;  Laterality: N/A;   CARDIOVERSION N/A 02/08/2019   Procedure: CARDIOVERSION;  Surgeon: Adrian Prows, MD;  Location: [place];  Service: Cardiovascular;  Laterality: N/A;   CARDIOVERSION N/A 05/24/2019   Procedure: CARDIOVERSION;  Surgeon: Elouise Munroe, MD;  Location: [facility] ENDOSCOPY;  Service: Cardiovascular;  Laterality: N/A;   CARDIOVERSION N/A 09/21/2019   Procedure: CARDIOVERSION;  Surgeon: Adrian Prows, MD;  Location: [facility] ENDOSCOPY;  Service: Cardiovascular;  Laterality: N/A;   CARDIOVERSION N/A 12/01/2019   Procedure: CARDIOVERSION;  Surgeon: Adrian Prows, MD;  Location: [place];  Service: Cardiovascular;  Laterality: N/A;   CARDIOVERSION N/A 08/20/2021   Procedure: CARDIOVERSION;  Surgeon: Adrian Prows, MD;  Location: [facility] ENDOSCOPY;  Service: Cardiovascular;  Laterality: N/A;   CARDIOVERSION N/A 10/18/2021   Procedure: CARDIOVERSION;  Surgeon: Adrian Prows, MD;  Location: [place];  Service: Cardiovascular;  Laterality: N/A;   CATARACT EXTRACTION W/ INTRAOCULAR LENS  IMPLANT, BILATERAL Bilateral    COLONOSCOPY     INGUINAL  HERNIA REPAIR Bilateral    as child   INTRAMEDULLARY (IM) NAIL INTERTROCHANTERIC Right 12/29/2021   Procedure: INTRAMEDULLARY (IM) NAIL INTERTROCHANTRIC;  Surgeon: Erle Crocker, MD;  Location: [place];  Service: Orthopedics;  Laterality: Right;   KNEE ARTHROSCOPY Right 2004   Patient Active Problem List   Diagnosis Date Noted   HLD (hyperlipidemia) 12/28/2021   BPH with elevated PSA 12/13/2019   Dermatitis 12/13/2019   Encounter for Medicare annual wellness exam 12/13/2019   Chronic pain of right knee 10/10/2019   Acquired thrombophilia ([place]) 05/17/2019   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/06/2018   Primary osteoarthritis of right knee 08/06/2018   Pes anserinus bursitis of left knee 10/31/2016   Allergic rhinitis 02/14/2016   Hearing loss 02/13/2016   CKD (chronic kidney disease) stage 3, GFR 30-59 ml/min (HCC) 02/13/2016   Paroxysmal atrial fibrillation ([place]) 03/31/2012   Primary hypertension 03/31/2012   Hemorrhoids, external 05/15/2011    PCP: [place], [place]: Josefa Half*, Melony Overly (hip)  REFERRING DIAG: S/P R hip fracture/ IM repair 12/29/21,   S/P R TKE (uni)   THERAPY DIAG:  Pain in right hip  Pain in right thigh  Stiffness of right ankle, not elsewhere classified  Pain in right ankle and joints of right foot  Other abnormalities of gait and mobility  ONSET DATE:  Surgery date: 09/11/21 R Uni TKA   SUBJECTIVE:   SUBJECTIVE STATEMENT:  States he feels about 90 % improved. Only mild soreness in R ant hip with prolonged walking. Can walk about 1 mi consistently, but feels tired, and has slight hip pain with longer distances.    PERTINENT HISTORY: Chronic A-Fib,   PAIN:  Are you having pain? Yes: NPRS scale: 0/10 Pain location: R knee Pain description: achey Aggravating factors: increased activity  Relieving factors: Rest, ice   Are you having pain? Yes: NPRS scale: 0-2/10 Pain location:  R hip Pain description: achey, sore  Aggravating factors: increased activity , movement  Relieving factors: Rest, ice    PRECAUTIONS: None  WEIGHT BEARING RESTRICTIONS No  FALLS:  Has patient fallen in last 6 months? Yes. Number of falls 1  PLOF: Independent  PATIENT GOALS  Decreased pain   OBJECTIVE:  updated 03/20/22   COGNITION:  Overall cognitive status: Within functional limits for tasks assessed      PALPATION:  LE ROM:  Hip ROM:WFL  Passive ROM Right 12/12/21 R 01/01/22 R 01/30/22 R 8/31                                                    Knee flexion 120   WFL    Knee extension 0   WFL            Hip flexion  ~90 deg WFL WFL                     (Blank rows = not tested)   LE MMT:  R Knee:  5/5,  R hip: 4 to 4+ /5    TODAY'S TREATMENT:  03/20/2022 Therapeutic Exercise: Aerobic: Recumbent bike L2 x 8 min Supine:   SLR 2x 10 on R;  S/L:    Seated:   Standing:  Slow march x 20; Hip abd 2x10 bil; Mini Squats x 20; Tandem stance 30 sec x 2 bil; Stretches:  standing hip flexor stretch x 2 min bil;  Gait:     Neuromuscular Re-education: Manual Therapy:      PATIENT EDUCATION:  Education details:  reviewed HEP   Person educated: Patient Education method: Consulting civil engineer, Demonstration, Tactile cues, Verbal cues, and Handouts Education comprehension: verbalized understanding, returned demonstration, verbal cues required, tactile cues required, and needs further education   HOME EXERCISE PROGRAM: Access Code: [email or web address] knee Access Code: [email or web address] Access Code: [email or web address] - s/p R hip fx    ASSESSMENT:  CLINICAL IMPRESSION: 03/20/2022 Pt making very good progress. He has much improved gait pattern in last several weeks, and is able to walk farther for exercise. He has mild soreness in anterior hip at times, and with longer distances. Overall he is doing very well, has met goals at this time, with much improved ROM, strength and function in R LE.  Discussed continued need for strengthening and longer time frame for full recovery, after having 3 surgeries on same leg. Final HEP reviewed in detail today, pt ready for d/c to HEP at this time, pt in agreement with plan.    OBJECTIVE IMPAIRMENTS Abnormal gait, decreased activity tolerance, decreased balance, decreased endurance, decreased knowledge of use of DME, decreased mobility, difficulty walking, decreased ROM, decreased strength, hypomobility, increased muscle spasms, impaired flexibility, and pain.   ACTIVITY LIMITATIONS cleaning, community activity, driving, meal prep, yard work, and shopping.   PERSONAL FACTORS  none  are also affecting patient's functional outcome.    REHAB POTENTIAL: Good  CLINICAL DECISION MAKING: Stable/uncomplicated  EVALUATION COMPLEXITY: Low   GOALS: Goals reviewed with patient? Yes  SHORT TERM GOALS: Target date: 01/15/22  Pt to be independent with initial HEP Goal status: MET   .  Pt to report pain levels 0-3/10 in R hip with activity.  Goal status: MET    LONG TERM GOALS: Target date: 03/27/2022  Pt to be independent with final HEP Goal status: MET  2.  Pt to report decreased pain in R knee to 0-2/10 with activity  Goal status: MET  3.  Pt to demo soft tissue limitations in R quad and thigh to be WNL, to improve pain and function.   Goal status: MET  4.  Pt to demo independent ambulation for community distances, without pain in knee, thigh or hip , Goal status: MET  5.  Pt to demo improved strength of R hip to at least 4/5 to improve stability, gait, and stairs.   Goal status: MET       PLAN: PT FREQUENCY: 1-2x/week  PT DURATION: 6 weeks  PLANNED INTERVENTIONS: Therapeutic exercises, Therapeutic activity, Neuromuscular re-education, Balance training, Gait training, Patient/Family education, Joint mobilization, Stair training, DME instructions, Dry Needling, Electrical stimulation, Spinal manipulation, Spinal mobilization,  Cryotherapy, Moist heat, Taping, Vasopneumatic device, Ultrasound, Ionotophoresis 40m/ml Dexamethasone, and Manual therapy   PLAN FOR NEXT SESSION:  LLyndee Hensen PT, DPT 11:20 AM  03/20/22   PHYSICAL THERAPY DISCHARGE SUMMARY  Visits from Start of Care: 17 Plan: Patient agrees to discharge.  Patient goals were  met. Patient is being discharged due to meeting the stated rehab goals.  Lyndee Hensen, PT, DPT 2:05 PM  03/20/22

## 2022-03-26 ENCOUNTER — Encounter: Payer: Self-pay | Admitting: Cardiology

## 2022-03-26 NOTE — Telephone Encounter (Signed)
From Patient

## 2022-04-09 NOTE — Telephone Encounter (Signed)
From pt

## 2022-04-30 NOTE — Telephone Encounter (Signed)
Patient needs OV asap per [facility]

## 2022-05-02 NOTE — Telephone Encounter (Signed)
Please bring him now instead of the OV day if possible. Otherwise 2oth is fine

## 2022-05-05 ENCOUNTER — Encounter: Payer: Self-pay | Admitting: Cardiology

## 2022-05-05 ENCOUNTER — Ambulatory Visit: Payer: Medicare Other | Admitting: Cardiology

## 2022-05-05 VITALS — BP 114/76 | HR 86 | Temp 97.9°F | Resp 16 | Ht 70.0 in | Wt 186.5 lb

## 2022-05-05 DIAGNOSIS — Z5181 Encounter for therapeutic drug level monitoring: Secondary | ICD-10-CM

## 2022-05-05 DIAGNOSIS — I2510 Atherosclerotic heart disease of native coronary artery without angina pectoris: Secondary | ICD-10-CM

## 2022-05-05 DIAGNOSIS — I10 Essential (primary) hypertension: Secondary | ICD-10-CM

## 2022-05-05 DIAGNOSIS — I4819 Other persistent atrial fibrillation: Secondary | ICD-10-CM

## 2022-05-05 MED ORDER — METOPROLOL TARTRATE 25 MG PO TABS: 25.0000 mg | ORAL_TABLET | Freq: Two times a day (BID) | ORAL | 2 refills | Status: DC

## 2022-05-05 NOTE — Progress Notes (Signed)
Primary Physician/Referring:  [name]  Patient ID: Gregory [name], male    DOB: 01/28/1941, 81 y.o.   MRN: [id number]  Chief Complaint  Patient presents with  . Paroxysmal atrial fibrillation  . Follow-up     HPI:    Gregory [name]  is a 81 y.o. Caucasian male with  history of stage 3b CKD, hypertension, mild hyperlipidemia and moderate CAD by coronary CTA, paroxysmal atrial fibrillation. He has history of multiple cardioversions and eventually dofetilide was discontinued by Dr. [name] due to efficacy, and was placed on  Multaq and has mostly maintained sinus since Dec 2018. Due to recurrence of atrial fibrillation, he underwent 2nd ablation on 02/22/2019, 1st ablation being on 11/28/2016.  He had repeat cardioversion on 05/24/2019 and after Tikosyn initiation and again on 12/01/2019 & 10/18/21 and also in July 2023.  Patient has gone back into atrial fibrillation and hence Multitak was discontinued.  He has noticed mild decrease in exercise tolerance, shortness of breath and fatigue with onset of atrial fibrillation.  Anterior orthopnea or leg edema.  Past Medical History:  Diagnosis Date  . 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 Arthritis    "right knee" (01/28/2016)  . Atypical atrial flutter ([place]) 05/17/2019  . Closed right hip fracture ([place]) 12/28/2021  . Diverticulosis of colon (without mention of hemorrhage)   . Hypertension   . Internal hemorrhoids without mention of complication   . Personal history of colonic polyps 01/17/2002   adenomatous, tublar adenoma 02/10/2007  . PNA (pneumonia) 03/31/2012   Social History   Tobacco Use  . Smoking status: Never  . Smokeless tobacco: Never  Substance Use Topics  . Alcohol use: Yes    Alcohol/week: 1.0 standard drink of alcohol    Types: 1 Glasses of wine per week    Comment: daily   Marital Status: Married  ROS  Review of Systems  Cardiovascular:   Negative for chest pain, dyspnea on exertion and leg swelling.   Objective  Blood pressure 114/76, pulse 86, temperature 97.9 F (36.6 C), temperature source Temporal, resp. rate 16, height 5' 10" (1.778 m), weight 186 lb 8 oz (84.6 kg), SpO2 99 %.     05/05/2022   10:00 AM 02/21/2022   10:57 AM 01/29/2022    9:43 AM  Vitals with BMI  Height 5' 10" 5' 10" 5' 10"  Weight 186 lbs 8 oz 184 lbs 182 lbs 3 oz  BMI 26.76 07.3 71.06  Systolic 269 485 462  Diastolic 76 70 62  Pulse 86 60 75     Physical Exam Constitutional:      Appearance: He is well-developed.  Neck:     Vascular: No carotid bruit or JVD.  Cardiovascular:     Rate and Rhythm: Tachycardia present. Rhythm irregular.     Pulses: Intact distal pulses.     Heart sounds: No murmur heard.    No gallop.  Pulmonary:     Effort: Pulmonary effort is normal. No accessory muscle usage or respiratory distress.     Breath sounds: Normal breath sounds.  Abdominal:     General: Bowel sounds are normal.     Palpations: Abdomen is soft.  Musculoskeletal:     Right lower leg: No edema.     Left lower leg: No edema.   Laboratory examination:   Recent Labs    12/28/21 2135 12/29/21 0341 12/30/21 0149 02/25/22 0805  NA 141 140  137 142  K 3.8 4.5 4.7 5.0  CL 110 106 110 105  CO2 _0 GLUCOSE 158* 138* 177* 101*  BUN 30* 29* 32* 25  CREATININE 1.98* 1.89* 2.20* 1.75*  CALCIUM 8.9 8.8* 8.2* 9.2  GFRNONAA 34* 35* 30*  --     CrCl cannot be calculated (Patient's most recent lab result is older than the maximum 21 days allowed.).     Latest Ref Rng & Units 02/25/2022    8:05 AM 12/30/2021    1:49 AM 12/29/2021    3:41 AM  CMP  Glucose 70 - 99 mg/dL 101  177  138   BUN 8 - 27 mg/dL 25  32  29   Creatinine 0.76 - 1.27 mg/dL 1.75  2.20  1.89   Sodium 134 - 144 mmol/L 142  137  140   Potassium 3.5 - 5.2 mmol/L 5.0  4.7  4.5   Chloride 96 - 106 mmol/L 105  110  106   CO2 20 - 29 mmol/L _1 Calcium 8.6 - 10.2  mg/dL 9.2  8.2  8.8   Total Protein 6.5 - 8.1 g/dL  5.1    Total Bilirubin 0.3 - 1.2 mg/dL  0.6    Alkaline Phos 38 - 126 U/L  36    AST 15 - 41 U/L  12    ALT 0 - 44 U/L  15        Latest Ref Rng & Units 02/25/2022    8:05 AM 12/30/2021    1:49 AM 12/29/2021    3:41 AM  CBC  WBC 3.4 - 10.8 x10E3/uL 5.1  6.8  6.2   Hemoglobin 13.0 - 17.7 g/dL 12.9  10.0  13.0   Hematocrit 37.5 - 51.0 % 40.4  29.8  40.3   Platelets 150 - 450 x10E3/uL 188  118  152    Lipid Panel     Component Value Date/Time   CHOL 134 07/10/2020 0817   TRIG 56 07/10/2020 0817   HDL 50 07/10/2020 0817   LDLCALC 72 07/10/2020 0817   HEMOGLOBIN A1C No results found for: "HGBA1C", "MPG" TSH 06/18/2018: TSH 1.69.   External labs:   Labs 12/23/2021:  Total cholesterol 127, triglycerides 65, HDL 53, LDL 60.  Medications and allergies  No Known Allergies   Current Outpatient Medications:  .  acetaminophen (TYLENOL) 500 MG tablet, Take 1,000 mg by mouth every 6 (six) hours as needed for moderate pain or headache., Disp: , Rfl:  .  apixaban (ELIQUIS) 5 MG TABS tablet, Take 1 tablet (5 mg total) by mouth 2 (two) times daily., Disp: 90 tablet, Rfl: 3 .  finasteride (PROSCAR) 5 MG tablet, Take 5 mg by mouth daily., Disp: , Rfl:  .  fluticasone (FLONASE) 50 MCG/ACT nasal spray, Place 2 sprays into both nostrils daily as needed for allergies., Disp: , Rfl:  .  lisinopril (ZESTRIL) 5 MG tablet, TAKE 1 TABLET(5 MG) BY MOUTH DAILY, Disp: 90 tablet, Rfl: 1 .  Multiple Vitamins-Minerals (MULTIVITAMIN ADULTS 50+) TABS, Take 1 tablet by mouth 2 (two) times a week., Disp: , Rfl:  .  MYRBETRIQ 50 MG TB24 tablet, Take 50 mg by mouth daily., Disp: , Rfl:  .  psyllium (METAMUCIL) 58.6 % packet, Take 1 packet by mouth daily., Disp: , Rfl:  .  rosuvastatin (CRESTOR) 5 MG tablet, TAKE 1 TABLET(5 MG) BY MOUTH DAILY, Disp: 90 tablet, Rfl: 3 .  tamsulosin (FLOMAX)  0.4 MG CAPS capsule, Take 0.8 mg by mouth every evening., Disp: , Rfl:      Radiology:   No results found.  Cardiac Studies:   Treadmill stress test  [10/09/2014]: Indication: Atrial Fibrillation Resting EKG SR with 1st degree AV Block, LAD, LAFB, RBBB. Trifascicular block. Normal BP response. There was no ST-T changes of ischemia with exercise stress test. No stress induced arrhythmias. Stress terminated due to THR (>85% MPHR)/MPHR met. The patient exercised according to Bruce Protocol, Total time recorded 06:50 min achieving max heart rate of 158 which was 106% of MPHR for age and 8.34 METS of work.  Coronary CTA 03/06/2019: 1. Coronary artery calcium score 1837 Agatston units. This places the patient in the 86th percentile for age and gender, suggesting high risk for future cardiac events. 2. Extensive calcified plaque throughout the coronary system. Mild stenosis for the most part, possibly around 50% stenosis in the distal RCA and the proximal LCx (small vessel). However, difficult to quantify given extensive calcification with possibility of blooming artifact. Will send for FFR. 3.  Pulmonary veins without stenosis.  4.  No LA appendage thrombus seen.  FFR 0.9 mid LAD, FFR 0.93 distal RCA, FFR 0.94 mid LCx: No evidence for hemodynamically significant stenosis.  Echocardiogram 09/29/2019:  Left ventricle cavity is normal in size. Mild concentric hypertrophy of the left ventricle. Normal global wall motion.  Normal LV systolic function  with EF 61%. Indeterminate diastolic filling pattern.  Left atrial cavity is severely dilated.  Moderate (Grade II) mitral regurgitation.  Moderate tricuspid regurgitation. Estimated pulmonary artery systolic pressure is 34 mmHg.  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  EKG:  EKG 02/21/2022: Sinus rhythm with first-degree AV block at rate of 56 bpm, otherwise normal EKG.  Normal QT interval.  No change from 01/30/2022 and 07/08/2021.  Assessment     ICD-10-CM   1. Persistent atrial fibrillation (HCC)  I48.19 EKG  12-Lead    Basic metabolic panel    CBC    Magnesium    2. Essential hypertension  I10     3. Coronary artery disease involving native coronary artery of native heart without angina pectoris  I25.10      CHA2DS2-VASc Score is 4.  Yearly risk of stroke: 5% (A, HTN, CAD).  Score of 1=0.6; 2=2.2; 3=3.2; 4=4.8; 5=7.2; 6=9.8; 7=>9.8) -(CHF; HTN; vasc disease DM,  Male = 1; Age <65 =0; 65-74 = 1,  >75 =2; stroke/embolism= 2).    Medications Discontinued During This Encounter  Medication Reason  . amiodarone (PACERONE) 200 MG tablet   . docusate sodium (COLACE) 100 MG capsule      No orders of the defined types were placed in this encounter.     Recommendations:   Gregory [name]  is a 81 y.o. Caucasian male with  history of stage 3b CKD, hypertension, mild hyperlipidemia and moderate CAD by coronary CTA, paroxysmal atrial fibrillation. He has history of multiple cardioversions and eventually dofetilide was discontinued by Dr. Camnitz due to efficacy, and was placed on  Multaq and has mostly maintained sinus since Dec 2018. Due to recurrence of atrial fibrillation, he underwent 2nd ablation on 02/22/2019, 1st ablation being on 11/28/2016.  He had repeat cardioversion on 05/24/2019 and after Tikosyn initiation and again on 12/01/2019 & 10/18/21 and also in July 2023.  1. Persistent atrial fibrillation (HCC) Patient has discontinued Multaq in view of efficacy, we are now preparing him for Tikosyn administration and needs hospitalization for   therapeutic drug monitoring.  Will add metoprolol tartrate 25 mg p.o. twice daily in view of RVR.  Previously was on diltiazem we will discontinue this in view of preparation for Tikosyn.  2. Essential hypertension Blood pressure is well controlled, continue present medications he is on a low-dose of an ACE inhibitor.  3. Coronary artery disease involving native coronary artery of native heart without angina pectoris He has not had any angina pectoris and  there is no clinical evidence of CHF.  He has very mild disease.  I will see him back in the office in 4 weeks, we plan on admitting him to the hospital next week.  We will obtain labs prior to hospitalization for Tikosyn administration.    Jay Ganji, MD, FACC 05/05/2022, 10:41 AM Office: [phone] Pager: [phone]  

## 2022-05-07 ENCOUNTER — Encounter (HOSPITAL_COMMUNITY): Payer: Self-pay

## 2022-05-08 ENCOUNTER — Other Ambulatory Visit: Payer: Self-pay | Admitting: Cardiology

## 2022-05-08 DIAGNOSIS — I480 Paroxysmal atrial fibrillation: Secondary | ICD-10-CM

## 2022-05-12 NOTE — Telephone Encounter (Signed)
Can this be refilled? 

## 2022-05-13 ENCOUNTER — Telehealth: Payer: Self-pay

## 2022-05-13 DIAGNOSIS — I4819 Other persistent atrial fibrillation: Secondary | ICD-10-CM | POA: Diagnosis present

## 2022-05-13 LAB — CBC
Hematocrit: 42.4 % (ref 37.5–51.0)
Hemoglobin: 13.5 g/dL (ref 13.0–17.7)
MCH: 31.6 pg (ref 26.6–33.0)
MCHC: 31.8 g/dL (ref 31.5–35.7)
MCV: 99 fL — ABNORMAL HIGH (ref 79–97)
Platelets: 211 10*3/uL (ref 150–450)
RBC: 4.27 x10E6/uL (ref 4.14–5.80)
RDW: 13.2 % (ref 11.6–15.4)
WBC: 4.6 10*3/uL (ref 3.4–10.8)

## 2022-05-13 LAB — BASIC METABOLIC PANEL
BUN/Creatinine Ratio: 19 (ref 10–24)
BUN: 35 mg/dL — ABNORMAL HIGH (ref 8–27)
CO2: 20 mmol/L (ref 20–29)
Calcium: 9.1 mg/dL (ref 8.6–10.2)
Chloride: 106 mmol/L (ref 96–106)
Creatinine, Ser: 1.89 mg/dL — ABNORMAL HIGH (ref 0.76–1.27)
Glucose: 91 mg/dL (ref 70–99)
Potassium: 4.7 mmol/L (ref 3.5–5.2)
Sodium: 137 mmol/L (ref 134–144)
eGFR: 35 mL/min/{1.73_m2} — ABNORMAL LOW (ref >59)

## 2022-05-13 LAB — MAGNESIUM: Magnesium: 2.3 mg/dL (ref 1.6–2.3)

## 2022-05-13 MED ORDER — SODIUM CHLORIDE 0.9% FLUSH: 3.0000 mL | Freq: Two times a day (BID) | INTRAVENOUS | Status: DC

## 2022-05-13 MED ORDER — SODIUM CHLORIDE 0.9% FLUSH: 3.0000 mL | INTRAVENOUS | Status: DC | PRN

## 2022-05-13 MED ORDER — APIXABAN 2.5 MG PO TABS: 5.0000 mg | ORAL_TABLET | Freq: Two times a day (BID) | ORAL | Status: DC

## 2022-05-13 MED ORDER — DOFETILIDE 125 MCG PO CAPS: 125.0000 ug | ORAL_CAPSULE | Freq: Two times a day (BID) | ORAL | Status: DC

## 2022-05-13 MED ORDER — SODIUM CHLORIDE 0.9 % IV SOLN: 250.0000 mL | INTRAVENOUS | Status: DC | PRN

## 2022-05-13 NOTE — Telephone Encounter (Signed)
I had already made arrangements for hospitalization, it is already been documented in the chart.  I discussed with the patient regarding this.  Suspect inpatient care has been extremely busy.  I will discuss with the administration.  Hopefully he will get admissions tomorrow.  Tikosyn administration orders have already been placed.

## 2022-05-13 NOTE — Telephone Encounter (Signed)
Patient needs you to call him ASAP, he is currently at the hospital waiting to be seen by you about a medication he is being placed on. Im not really understanding what he is talking about. Please call him.

## 2022-05-14 ENCOUNTER — Inpatient Hospital Stay (HOSPITAL_COMMUNITY)
Admission: RE | Admit: 2022-05-14 | Discharge: 2022-05-16 | DRG: 310 | Disposition: A | Discharge status: Home / Self Care | Payer: Medicare Other | Attending: Cardiology | Admitting: Cardiology

## 2022-05-14 ENCOUNTER — Other Ambulatory Visit: Payer: Self-pay

## 2022-05-14 ENCOUNTER — Encounter: Payer: Self-pay | Admitting: Cardiology

## 2022-05-14 ENCOUNTER — Telehealth (HOSPITAL_COMMUNITY): Payer: Self-pay

## 2022-05-14 ENCOUNTER — Other Ambulatory Visit (HOSPITAL_COMMUNITY): Payer: Self-pay

## 2022-05-14 ENCOUNTER — Encounter (HOSPITAL_COMMUNITY): Payer: Self-pay | Admitting: Cardiology

## 2022-05-14 DIAGNOSIS — Z79899 Other long term (current) drug therapy: Secondary | ICD-10-CM | POA: Diagnosis not present

## 2022-05-14 DIAGNOSIS — N1832 Chronic kidney disease, stage 3b: Secondary | ICD-10-CM | POA: Diagnosis not present

## 2022-05-14 DIAGNOSIS — N183 Chronic kidney disease, stage 3 unspecified: Secondary | ICD-10-CM | POA: Diagnosis present

## 2022-05-14 DIAGNOSIS — I129 Hypertensive chronic kidney disease with stage 1 through stage 4 chronic kidney disease, or unspecified chronic kidney disease: Secondary | ICD-10-CM | POA: Diagnosis present

## 2022-05-14 DIAGNOSIS — Z823 Family history of stroke: Secondary | ICD-10-CM

## 2022-05-14 DIAGNOSIS — Z5181 Encounter for therapeutic drug level monitoring: Secondary | ICD-10-CM

## 2022-05-14 DIAGNOSIS — I4819 Other persistent atrial fibrillation: Secondary | ICD-10-CM | POA: Diagnosis not present

## 2022-05-14 DIAGNOSIS — Z8052 Family history of malignant neoplasm of bladder: Secondary | ICD-10-CM | POA: Diagnosis not present

## 2022-05-14 DIAGNOSIS — Z86010 Personal history of colonic polyps: Secondary | ICD-10-CM | POA: Diagnosis not present

## 2022-05-14 DIAGNOSIS — I480 Paroxysmal atrial fibrillation: Secondary | ICD-10-CM

## 2022-05-14 DIAGNOSIS — I440 Atrioventricular block, first degree: Secondary | ICD-10-CM | POA: Diagnosis not present

## 2022-05-14 DIAGNOSIS — Z7901 Long term (current) use of anticoagulants: Secondary | ICD-10-CM

## 2022-05-14 DIAGNOSIS — E785 Hyperlipidemia, unspecified: Secondary | ICD-10-CM | POA: Diagnosis not present

## 2022-05-14 DIAGNOSIS — Z8249 Family history of ischemic heart disease and other diseases of the circulatory system: Secondary | ICD-10-CM | POA: Diagnosis not present

## 2022-05-14 DIAGNOSIS — I130 Hypertensive heart and chronic kidney disease with heart failure and stage 1 through stage 4 chronic kidney disease, or unspecified chronic kidney disease: Secondary | ICD-10-CM | POA: Diagnosis present

## 2022-05-14 DIAGNOSIS — I2510 Atherosclerotic heart disease of native coronary artery without angina pectoris: Secondary | ICD-10-CM | POA: Diagnosis not present

## 2022-05-14 DIAGNOSIS — I4891 Unspecified atrial fibrillation: Secondary | ICD-10-CM | POA: Diagnosis not present

## 2022-05-14 LAB — BASIC METABOLIC PANEL
Anion gap: 8 (ref 5–15)
BUN: 29 mg/dL — ABNORMAL HIGH (ref 8–23)
CO2: 22 mmol/L (ref 22–32)
Calcium: 9 mg/dL (ref 8.9–10.3)
Chloride: 108 mmol/L (ref 98–111)
Creatinine, Ser: 1.71 mg/dL — ABNORMAL HIGH (ref 0.61–1.24)
GFR, Estimated: 40 mL/min — ABNORMAL LOW (ref >60)
Glucose, Bld: 96 mg/dL (ref 70–99)
Potassium: 4.3 mmol/L (ref 3.5–5.1)
Sodium: 138 mmol/L (ref 135–145)

## 2022-05-14 LAB — MAGNESIUM: Magnesium: 2.3 mg/dL (ref 1.7–2.4)

## 2022-05-14 MED ORDER — MIRABEGRON ER 50 MG PO TB24
50.0000 mg | ORAL_TABLET | Freq: Every day | ORAL | Status: DC
  Administered 2022-05-15 – 2022-05-16 (×2): 50 mg via ORAL
  Filled 2022-05-14 (×2): qty 1

## 2022-05-14 MED ORDER — FINASTERIDE 5 MG PO TABS
5.0000 mg | ORAL_TABLET | Freq: Every day | ORAL | Status: DC
  Administered 2022-05-15 – 2022-05-16 (×2): 5 mg via ORAL
  Filled 2022-05-14 (×2): qty 1

## 2022-05-14 MED ORDER — ROSUVASTATIN CALCIUM 5 MG PO TABS
5.0000 mg | ORAL_TABLET | Freq: Every day | ORAL | Status: DC
  Administered 2022-05-15 – 2022-05-16 (×2): 5 mg via ORAL
  Filled 2022-05-14 (×2): qty 1

## 2022-05-14 MED ORDER — APIXABAN 5 MG PO TABS
5.0000 mg | ORAL_TABLET | Freq: Two times a day (BID) | ORAL | Status: DC
  Administered 2022-05-15: 5 mg via ORAL
  Filled 2022-05-14 (×2): qty 1

## 2022-05-14 MED ORDER — LISINOPRIL 5 MG PO TABS
2.5000 mg | ORAL_TABLET | Freq: Every day | ORAL | Status: DC
  Administered 2022-05-15 – 2022-05-16 (×2): 2.5 mg via ORAL
  Filled 2022-05-14 (×2): qty 1

## 2022-05-14 MED ORDER — PSYLLIUM 95 % PO PACK
1.0000 | PACK | Freq: Every day | ORAL | Status: DC
  Administered 2022-05-15 – 2022-05-16 (×2): 1 via ORAL
  Filled 2022-05-14 (×3): qty 1

## 2022-05-14 MED ORDER — DOFETILIDE 125 MCG PO CAPS
125.0000 ug | ORAL_CAPSULE | Freq: Two times a day (BID) | ORAL | Status: DC
  Administered 2022-05-14: 125 ug via ORAL
  Filled 2022-05-14 (×2): qty 1

## 2022-05-14 MED ORDER — SODIUM CHLORIDE 0.9% FLUSH
3.0000 mL | Freq: Two times a day (BID) | INTRAVENOUS | Status: DC
  Administered 2022-05-15 – 2022-05-16 (×2): 3 mL via INTRAVENOUS

## 2022-05-14 MED ORDER — METOPROLOL TARTRATE 25 MG PO TABS
25.0000 mg | ORAL_TABLET | Freq: Two times a day (BID) | ORAL | Status: DC
  Administered 2022-05-15 – 2022-05-16 (×3): 25 mg via ORAL
  Filled 2022-05-14 (×4): qty 1

## 2022-05-14 MED ORDER — SODIUM CHLORIDE 0.9% FLUSH: 3.0000 mL | INTRAVENOUS | Status: DC | PRN

## 2022-05-14 MED ORDER — TAMSULOSIN HCL 0.4 MG PO CAPS
0.8000 mg | ORAL_CAPSULE | Freq: Every evening | ORAL | Status: DC
  Administered 2022-05-15: 0.8 mg via ORAL
  Filled 2022-05-14 (×2): qty 2

## 2022-05-14 MED ORDER — SODIUM CHLORIDE 0.9 % IV SOLN: 250.0000 mL | INTRAVENOUS | Status: DC | PRN

## 2022-05-14 NOTE — Telephone Encounter (Signed)
From pt

## 2022-05-14 NOTE — Telephone Encounter (Signed)
Pharmacy Patient Advocate Encounter  Insurance verification completed.    The patient is insured through [employer]   The patient is currently admitted and ran test claims for the following: TIKOSYN 125 MCG CAPSULE.  Copays and coinsurance results were relayed to Inpatient clinical team.  Haze Rushing, CPhT Pharmacy Patient Advocate Specialist Direct Number: [phone] Fax: [phone]

## 2022-05-14 NOTE — Progress Notes (Addendum)
Pt complained of pain at IV site. IV was checked. Readily gives blood return and flushes easily without complication. No swelling or redness observed at site. Dressing was changed due to tape possibly pulling on pt arm. After dressing change pt reports that site feels better and has no complaints of pain when flushing IV. Unit RN notified of assessment and will consult IV team again if additional problems occurs

## 2022-05-14 NOTE — Progress Notes (Signed)
Pharmacy: Dofetilide (Tikosyn) - Initial Consult Assessment and Electrolyte Replacement  Pharmacy consulted to assist in monitoring and replacing electrolytes in this 81 y.o. male admitted on 05/14/2022 undergoing dofetilide initiation. First dofetilide dose: 11/29 PM  Assessment:  Patient Exclusion Criteria: If any screening criteria checked as "Yes", then  patient  should NOT receive dofetilide until criteria item is corrected.  If "Yes" please indicate correction plan.  YES  NO Patient  Exclusion Criteria Correction Plan   []   [x]   Baseline QTc interval is greater than or equal to 440 msec. IF above YES box checked dofetilide contraindicated unless patient has ICD; then may proceed if QTc 500-550 msec or with known ventricular conduction abnormalities may proceed with QTc 550-600 msec. QTc =  399    []   [x]   Patient is known or suspected to have a digoxin level greater than 2 ng/ml: No results found for: "DIGOXIN"     []   [x]   Creatinine clearance less than 20 ml/min (calculated using Cockcroft-Gault, actual body weight and serum creatinine): Estimated Creatinine Clearance: 35.6 mL/min (A) (by C-G formula based on SCr of 1.71 mg/dL (H)).     []   [x]  Patient has received drugs known to prolong the QT intervals within the last 48 hours (phenothiazines, tricyclics or tetracyclic antidepressants, erythromycin, H-1 antihistamines, cisapride, fluoroquinolones, azithromycin, ondansetron).   Updated information on QT prolonging agents is available to be searched on the following database:QT prolonging agents     []   [x]   Patient received a dose of hydrochlorothiazide (Oretic) alone or in any combination including triamterene (Dyazide, Maxzide) in the last 48 hours.    []   [x]  Patient received a medication known to increase dofetilide plasma concentrations prior to initial dofetilide dose:  Trimethoprim (Primsol, Proloprim) in the last 36 hours Verapamil (Calan, Verelan) in the  last 36 hours or a sustained release dose in the last 72 hours Megestrol (Megace) in the last 5 days  Cimetidine (Tagamet) in the last 6 hours Ketoconazole (Nizoral) in the last 24 hours Itraconazole (Sporanox) in the last 48 hours  Prochlorperazine (Compazine) in the last 36 hours     []   [x]   Patient is known to have a history of torsades de pointes; congenital or acquired long QT syndromes.    []   [x]   Patient has received a Class 1 antiarrhythmic with less than 2 half-lives since last dose. (Disopyramide, Quinidine, Procainamide, Lidocaine, Mexiletine, Flecainide, Propafenone)    []   [x]   Patient has received amiodarone therapy in the past 3 months or amiodarone level is greater than 0.3 ng/ml.    Labs:    Component Value Date/Time   K 4.3 05/14/2022 1726   MG 2.3 05/14/2022 1726     Plan: Select One Calculated CrCl  Dose q12h  []  > 60 ml/min 500 mcg  []  40-60 ml/min 250 mcg  [x]  20-40 ml/min 125 mcg   [x]   Physician selected initial dose within range recommended for patients level of renal function - will monitor for response.  []   Physician selected initial dose outside of range recommended for patients level of renal function - will discuss if the dose should be altered at this time.   Patient has been appropriately anticoagulated with apixaban.  Potassium: K >/= 4: Appropriate to initiate Tikosyn, no replacement needed    Magnesium: Mg >2: Appropriate to initiate Tikosyn, no replacement needed     , PharmD, BCIDP, AAHIVP, CPP Infectious Disease Pharmacist 05/14/2022 8:27 PM

## 2022-05-14 NOTE — Progress Notes (Incomplete)
Pharmacy: Dofetilide (Tikosyn) - Initial Consult Assessment and Electrolyte Replacement  Pharmacy consulted to assist in monitoring and replacing electrolytes in this 81 y.o. male admitted on (Not on file) undergoing dofetilide {dofetilide initiation vs re:21922}. First dofetilide dose: ***  Assessment:  Patient Exclusion Criteria: If any screening criteria checked as "Yes", then  patient  should NOT receive dofetilide until criteria item is corrected.  If "Yes" please indicate correction plan.  YES  NO Patient  Exclusion Criteria Correction Plan   []   []   Baseline QTc interval is greater than or equal to 440 msec. IF above YES box checked dofetilide contraindicated unless patient has ICD; then may proceed if QTc 500-550 msec or with known ventricular conduction abnormalities may proceed with QTc 550-600 msec. QTc =      []   [x]   Patient is known or suspected to have a digoxin level greater than 2 ng/ml: No results found for: "DIGOXIN"     []   [x]   Creatinine clearance less than 20 ml/min (calculated using Cockcroft-Gault, actual body weight and serum creatinine): Estimated Creatinine Clearance: 32.2 mL/min (A) (by C-G formula based on SCr of 1.89 mg/dL (H)).     []   [x]  Patient has received drugs known to prolong the QT intervals within the last 48 hours (phenothiazines, tricyclics or tetracyclic antidepressants, erythromycin, H-1 antihistamines, cisapride, fluoroquinolones, azithromycin, ondansetron).   Updated information on QT prolonging agents is available to be searched on the following database:QT prolonging agents     []   [x]   Patient received a dose of hydrochlorothiazide (Oretic) alone or in any combination including triamterene (Dyazide, Maxzide) in the last 48 hours.    []   [x]  Patient received a medication known to increase dofetilide plasma concentrations prior to initial dofetilide dose:  Trimethoprim (Primsol, Proloprim) in the last 36 hours Verapamil (Calan,  Verelan) in the last 36 hours or a sustained release dose in the last 72 hours Megestrol (Megace) in the last 5 days  Cimetidine (Tagamet) in the last 6 hours Ketoconazole (Nizoral) in the last 24 hours Itraconazole (Sporanox) in the last 48 hours  Prochlorperazine (Compazine) in the last 36 hours     []   [x]   Patient is known to have a history of torsades de pointes; congenital or acquired long QT syndromes.    []   [x]   Patient has received a Class 1 antiarrhythmic with less than 2 half-lives since last dose. (Disopyramide, Quinidine, Procainamide, Lidocaine, Mexiletine, Flecainide, Propafenone)    []   [x]   Patient has received amiodarone therapy in the past 3 months or amiodarone level is greater than 0.3 ng/ml.    Labs:    Component Value Date/Time   K 4.7 05/12/2022 0837   MG 2.3 05/12/2022     Plan: Select One Calculated CrCl  Dose q12h  []  > 60 ml/min 500 mcg  []  40-60 ml/min 250 mcg  [x]  20-40 ml/min 125 mcg   [x]   Physician selected initial dose within range recommended for patients level of renal function - will monitor for response.  []   Physician selected initial dose outside of range recommended for patients level of renal function - will discuss if the dose should be altered at this time.   Patient has been appropriately anticoagulated with apixaban.  Potassium: {potassium tikosyn lyte:21920}  Magnesium: {magnesium tikosyn lyte:21921}   Thank you for allowing pharmacy to participate in this patient's care   PharmD., BCPS Clinical Pharmacist 05/14/2022 1:38 PM

## 2022-05-14 NOTE — TOC Benefit Eligibility Note (Signed)
Patient Product/process development scientist completed.    The patient is currently admitted and upon discharge could be taking TIKOSYN 125 MCG CAPSULE.  The current 30 day co-pay is $3.13.   The patient is insured through [employer]    Haze Rushing, CPhT Pharmacy Patient Advocate Specialist Direct Number: [phone] Fax: [phone]

## 2022-05-14 NOTE — H&P (Signed)
CARDIOLOGY ADMIT NOTE   Patient ID: Gregory [name] MRN: [id number] DOB/AGE: [birth year] 81 y.o.  Admit date: 05/14/2022 Primary Physician:  [facility]  Patient ID: Gregory [name], male    DOB: December 26, 1940, 81 y.o.   MRN: [id number]  No chief complaint on file.  HPI:    Gregory [name]  is a 81 y.o. Caucasian male with  history of stage 3b CKD, hypertension, mild hyperlipidemia and moderate CAD by coronary CTA, paroxysmal atrial fibrillation. He has history of multiple cardioversions and eventually dofetilide was discontinued by Dr. Elberta Fortis due to efficacy, and was placed on Multaq and has mostly maintained sinus rhythm however due to recurrent A-fib, underwent second ablation on 02/22/2019.  Again due to recurrent episodes of atrial fibrillation and multiple cardioversions, he was switched over to amiodarone again amiodarone having failed, he is now being admitted for Tikosyn administration as he prefers to try 1 more time.    Except for chronic symptoms of mild fatigue and decreased exercise tolerance and mild dyspnea he has no new symptoms.  His wife is present.  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 01/12/2020   Arthritis    "right knee" (01/28/2016)   Atypical atrial flutter (HCC) 05/17/2019   Closed right hip fracture (HCC) 12/28/2021   Diverticulosis of colon (without mention of hemorrhage)    Hypertension    Internal hemorrhoids without mention of complication    Personal history of colonic polyps 01/17/2002   adenomatous, tublar adenoma 02/10/2007   PNA (pneumonia) 03/31/2012   Past Surgical History:  Procedure Laterality Date   ATRIAL FIBRILLATION ABLATION  11/28/2016   ATRIAL FIBRILLATION ABLATION N/A 11/28/2016   Procedure: Atrial Fibrillation Ablation;  Surgeon: Regan Lemming, MD;  Location: [facility] INVASIVE CV LAB;  Service: Cardiovascular;  Laterality: N/A;   ATRIAL  FIBRILLATION ABLATION N/A 03/18/2019   Procedure: ATRIAL FIBRILLATION ABLATION;  Surgeon: Regan Lemming, MD;  Location: [facility] INVASIVE CV LAB;  Service: Cardiovascular;  Laterality: N/A;   CARDIOVERSION  05/03/2012   Procedure: CARDIOVERSION;  Surgeon: [name], MD;  Location: [facility] ENDOSCOPY;  Service: Cardiovascular;  Laterality: N/A;   CARDIOVERSION  06/08/2012   Procedure: CARDIOVERSION;  Surgeon: [name], MD;  Location: [facility] ENDOSCOPY;  Service: Cardiovascular;  Laterality: N/A;  Lawrence Marseilles Wille Celeste   CARDIOVERSION N/A 10/28/2013   Procedure: CARDIOVERSION;  Surgeon: [name], MD;  Location: [facility] ENDOSCOPY;  Service: Cardiovascular;  Laterality: N/A;   CARDIOVERSION N/A 06/30/2014   Procedure: CARDIOVERSION;  Surgeon: [name], MD;  Location: [facility] ENDOSCOPY;  Service: Cardiovascular;  Laterality: N/A;  H&P in file   CARDIOVERSION N/A 09/05/2014   Procedure: CARDIOVERSION;  Surgeon: [name], MD;  Location: [facility] ENDOSCOPY;  Service: Cardiovascular;  Laterality: N/A;   CARDIOVERSION N/A 12/24/2015   Procedure: CARDIOVERSION;  Surgeon: [name], MD;  Location: [facility] ENDOSCOPY;  Service: Cardiovascular;  Laterality: N/A;   CARDIOVERSION N/A 01/30/2016   Procedure: CARDIOVERSION;  Surgeon: [name], MD;  Location: [facility] ENDOSCOPY;  Service: Cardiovascular;  Laterality: N/A;   CARDIOVERSION N/A 02/08/2016   Procedure: CARDIOVERSION;  Surgeon: [name], MD;  Location: [facility] ENDOSCOPY;  Service: Cardiovascular;  Laterality: N/A;   CARDIOVERSION N/A 02/08/2019   Procedure: CARDIOVERSION;  Surgeon: [name], MD;  Location: [facility] ENDOSCOPY;  Service: Cardiovascular;  Laterality: N/A;   CARDIOVERSION N/A 05/24/2019   Procedure: CARDIOVERSION;  Surgeon: Parke Poisson, MD;  Location: [facility] ENDOSCOPY;  Service: Cardiovascular;  Laterality: N/A;   CARDIOVERSION N/A 09/21/2019   Procedure: CARDIOVERSION;  Surgeon: Yates DecampGanji, Nyiah Pianka, MD;  [facility];  Service: Cardiovascular;   Laterality: N/A;   CARDIOVERSION N/A 12/01/2019   Procedure: CARDIOVERSION;  Surgeon: Yates DecampGanji, Mihailo Sage, MD;  [facility];  Service: Cardiovascular;  Laterality: N/A;   CARDIOVERSION N/A 08/20/2021   Procedure: CARDIOVERSION;  Surgeon: Yates DecampGanji, Viha Kriegel, MD;  [facility];  Service: Cardiovascular;  Laterality: N/A;   CARDIOVERSION N/A 10/18/2021   Procedure: CARDIOVERSION;  Surgeon: Yates DecampGanji, Sharlena Kristensen, MD;  [facility];  Service: Cardiovascular;  Laterality: N/A;   CATARACT EXTRACTION W/ INTRAOCULAR LENS  IMPLANT, BILATERAL Bilateral    COLONOSCOPY     INGUINAL HERNIA REPAIR Bilateral    as child   INTRAMEDULLARY (IM) NAIL INTERTROCHANTERIC Right 12/29/2021   Procedure: INTRAMEDULLARY (IM) NAIL INTERTROCHANTRIC;  Surgeon: Terance HartAdair, Christopher R, MD;  [facility];  Service: Orthopedics;  Laterality: Right;   KNEE ARTHROSCOPY Right 2004   LIGAMENT REPAIR Right    PARTIAL KNEE ARTHROPLASTY Right 08/26/2021   Social History   Tobacco Use   Smoking status: Never   Smokeless tobacco: Never  Substance Use Topics   Alcohol use: Yes    Alcohol/week: 1.0 standard drink of alcohol    Types: 1 Glasses of wine per week    Comment: daily  Marital Status: Married    Family History  Problem Relation Age of Onset   Cancer Mother        bladder   Bladder Cancer Mother    Heart disease Father    Stroke Father    Colon cancer Neg Hx    Stomach cancer Neg Hx    Esophageal cancer Neg Hx    Liver cancer Neg Hx    Pancreatic cancer Neg Hx     ROS  Review of Systems  Cardiovascular:  Positive for dyspnea on exertion and palpitations. Negative for chest pain and leg swelling.   Objective      05/14/2022    5:14 PM 05/05/2022   10:00 AM 02/21/2022   10:57 AM  Vitals with BMI  Height 5\' 10"  5\' 10"  5\' 10"   Weight 186 lbs 2 oz 186 lbs 8 oz 184 lbs  BMI 26.7 26.76 26.4  Systolic 114 114 [id number]  Diastolic 85 76 70  Pulse 79 86 60      Physical Exam Neck:     Vascular: No  carotid bruit or JVD.  Cardiovascular:     Rate and Rhythm: Normal rate. Rhythm irregular.     Pulses: Normal pulses and intact distal pulses.     Heart sounds: No murmur heard. Pulmonary:     Effort: Pulmonary effort is normal.     Breath sounds: Normal breath sounds.  Abdominal:     General: Bowel sounds are normal.     Palpations: Abdomen is soft.  Musculoskeletal:     Right lower leg: No edema.     Left lower leg: No edema.  Skin:    Capillary Refill: Capillary refill takes less than 2 seconds.    Laboratory examination:   Recent Labs    12/28/21 2135 12/29/21 0341 12/30/21 0149 02/25/22 0805 05/12/22 0837  NA 141 140 137 142 137  K 3.8 4.5 4.7 5.0 4.7  CL 110 106 110 105 106  CO2 22 23 22 21 20   GLUCOSE 158* 138* 177* 101* 91  BUN 30* 29* 32* 25 35*  CREATININE 1.98* 1.89* 2.20* 1.75* 1.89*  CALCIUM  8.9 8.8* 8.2* 9.2 9.1  GFRNONAA 34* 35* 30*  --   --    estimated creatinine clearance is 32.2 mL/min (A) (by C-G formula based on SCr of 1.89 mg/dL (H)).     Latest Ref Rng & Units 05/14/2022    5:26 PM 05/12/2022    8:37 AM 02/25/2022    8:05 AM  CMP  Glucose 70 - 99 mg/dL 96  91  003   BUN 8 - 23 mg/dL 29  35  25   Creatinine 0.61 - 1.24 mg/dL 7.04  8.88  9.16   Sodium 135 - 145 mmol/L 138  137  142   Potassium 3.5 - 5.1 mmol/L 4.3  4.7  5.0   Chloride 98 - 111 mmol/L 108  106  105   CO2 22 - 32 mmol/L 22  20  21    Calcium 8.9 - 10.3 mg/dL 9.0  9.1  9.2       Latest Ref Rng & Units 05/12/2022    8:37 AM 02/25/2022    8:05 AM 12/30/2021    1:49 AM  CBC  WBC 3.4 - 10.8 x10E3/uL 4.6  5.1  6.8   Hemoglobin 13.0 - 17.7 g/dL 01/01/2022  94.5  03.8   Hematocrit 37.5 - 51.0 % 42.4  40.4  29.8   Platelets 150 - 450 x10E3/uL 211  188  118    Lipid Panel     Component Value Date/Time   CHOL 134 07/10/2020 0817   TRIG 56 07/10/2020 0817   HDL 50 07/10/2020 0817   LDLCALC 72 07/10/2020 0817  TSH No results for input(s): "TSH" in the last 8760 hours.  Medications  and allergies  No Known Allergies   Current Outpatient Medications  Medication Instructions   acetaminophen (TYLENOL) 1,000 mg, Oral, Every 6 hours PRN   apixaban (ELIQUIS) 5 mg, Oral, 2 times daily   finasteride (PROSCAR) 5 mg, Oral, Daily   fluticasone (FLONASE) 50 MCG/ACT nasal spray 2 sprays, Each Nare, Daily PRN   lisinopril (ZESTRIL) 5 MG tablet TAKE 1 TABLET(5 MG) BY MOUTH DAILY   metoprolol tartrate (LOPRESSOR) 25 mg, Oral, 2 times daily   Multiple Vitamins-Minerals (MULTIVITAMIN ADULTS 50+) TABS 1 tablet, Oral, 2 times weekly   Myrbetriq 50 mg, Oral, Daily   psyllium (METAMUCIL) 58.6 % packet 1 packet, Oral, Daily   rosuvastatin (CRESTOR) 5 MG tablet TAKE 1 TABLET(5 MG) BY MOUTH DAILY   tamsulosin (FLOMAX) 0.8 mg, Oral, Every evening    Radiology:  NA  Cardiac Studies:   Coronary CTA 03/06/2019: 1. Coronary artery calcium score 1837 Agatston units. This places the patient in the 86th percentile for age and gender, suggesting high risk for future cardiac events. 2. Extensive calcified plaque throughout the coronary system. Mild stenosis for the most part, possibly around 50% stenosis in the distal RCA and the proximal LCx (small vessel). However, difficult to quantify given extensive calcification with possibility of blooming artifact. Will send for FFR. 3.  Pulmonary veins without stenosis.  4.  No LA appendage thrombus seen.   FFR 0.9 mid LAD, FFR 0.93 distal RCA, FFR 0.94 mid LCx: No evidence for hemodynamically significant stenosis.   Echocardiogram 09/29/2019:  Left ventricle cavity is normal in size. Mild concentric hypertrophy of the left ventricle. Normal global wall motion.  Normal LV systolic function  with EF 61%. Indeterminate diastolic filling pattern.  Left atrial cavity is severely dilated.  Moderate (Grade II) mitral regurgitation.  Moderate tricuspid regurgitation. Estimated pulmonary artery  systolic pressure is 34 mmHg.  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   EKG:   EKG 02/21/2022: Sinus rhythm with first-degree AV block at rate of 56 bpm, otherwise normal EKG.  Normal QT interval.  No change from 01/30/2022 and 07/08/2021   Assessment   1.  Persistent atrial fibrillation, symptomatic with fatigue and dyspnea 2.  Atrial fibrillation with rapid ventricular response 3.  Primary hypertension 4.  Stage IIIb chronic kidney disease  Recommendations:   Gregory [name]  is a 81 y.o. Caucasian male with  history of stage 3b CKD, hypertension, mild hyperlipidemia and moderate CAD by coronary CTA, paroxysmal atrial fibrillation. He has history of multiple cardioversions and eventually dofetilide was discontinued by Dr. Elberta Fortis due to efficacy, and was placed on Multaq and has mostly maintained sinus rhythm however due to recurrent A-fib, underwent second ablation on 02/22/2019.  Again due to recurrent episodes of atrial fibrillation and multiple cardioversions, he was switched over to amiodarone again amiodarone having failed, he is now being admitted for Tikosyn administration as he prefers to try 1 more time.    He already has had labs in the outpatient basis, potassium level is stable and magnesium levels are normal, renal function has remained stable.  Will initiate Tikosyn tonight and see how he does and possible plan on cardioversion if he does not cardiovert after 5 doses of Tikosyn.  Blood pressure is well-controlled, since addition of beta-blocker, heart rate seem to have slightly improved as well.  Will continue the same for now.  He is presently on anticoagulation, continue the same.  He has not had an echocardiogram in >2 years, I will also order an echocardiogram to be performed but would like to have better heart rate control.  [name], MD, [facility] 05/14/2022, 6:35 PM Office: [phone] Fax: [phone] Pager: [phone]

## 2022-05-15 ENCOUNTER — Inpatient Hospital Stay (HOSPITAL_COMMUNITY): Payer: Medicare Other

## 2022-05-15 ENCOUNTER — Encounter: Payer: Self-pay | Admitting: Cardiology

## 2022-05-15 DIAGNOSIS — Z5181 Encounter for therapeutic drug level monitoring: Secondary | ICD-10-CM

## 2022-05-15 DIAGNOSIS — N183 Chronic kidney disease, stage 3 unspecified: Secondary | ICD-10-CM | POA: Diagnosis present

## 2022-05-15 DIAGNOSIS — I130 Hypertensive heart and chronic kidney disease with heart failure and stage 1 through stage 4 chronic kidney disease, or unspecified chronic kidney disease: Secondary | ICD-10-CM

## 2022-05-15 LAB — BASIC METABOLIC PANEL
Anion gap: 12 (ref 5–15)
BUN: 30 mg/dL — ABNORMAL HIGH (ref 8–23)
CO2: 22 mmol/L (ref 22–32)
Calcium: 8.9 mg/dL (ref 8.9–10.3)
Chloride: 107 mmol/L (ref 98–111)
Creatinine, Ser: 1.73 mg/dL — ABNORMAL HIGH (ref 0.61–1.24)
GFR, Estimated: 39 mL/min — ABNORMAL LOW (ref >60)
Glucose, Bld: 95 mg/dL (ref 70–99)
Potassium: 4.1 mmol/L (ref 3.5–5.1)
Sodium: 141 mmol/L (ref 135–145)

## 2022-05-15 LAB — ECHOCARDIOGRAM COMPLETE
AR max vel: 3.1 cm2
AV Area VTI: 2.66 cm2
AV Area mean vel: 2.93 cm2
AV Mean grad: 2 mmHg
AV Peak grad: 3.7 mmHg
Ao pk vel: 0.96 m/s
Height: 70 in
S' Lateral: 2.9 cm
Weight: 2977.6 oz

## 2022-05-15 LAB — MAGNESIUM: Magnesium: 2.2 mg/dL (ref 1.7–2.4)

## 2022-05-15 LAB — TSH: TSH: 1.958 u[IU]/mL (ref 0.350–4.500)

## 2022-05-15 MED ORDER — SODIUM CHLORIDE 0.9 % IV SOLN: INTRAVENOUS | Status: DC

## 2022-05-15 MED ORDER — DOFETILIDE 250 MCG PO CAPS
250.0000 ug | ORAL_CAPSULE | Freq: Two times a day (BID) | ORAL | Status: DC
  Administered 2022-05-15 – 2022-05-16 (×4): 250 ug via ORAL
  Filled 2022-05-15 (×4): qty 1

## 2022-05-15 MED ORDER — APIXABAN 2.5 MG PO TABS
2.5000 mg | ORAL_TABLET | Freq: Two times a day (BID) | ORAL | Status: DC
  Administered 2022-05-15 – 2022-05-16 (×2): 2.5 mg via ORAL
  Filled 2022-05-15 (×2): qty 1

## 2022-05-15 NOTE — Progress Notes (Signed)
  Echocardiogram 2D Echocardiogram has been performed.  Gerda Diss 05/15/2022, 9:22 AM

## 2022-05-15 NOTE — Care Management (Signed)
  Transition of Care [facility]) Screening Note   Patient Details  Name: Gregory [name] Date of Birth: May 29, 1941   Transition of Care [facility]) CM/SW Contact:    Gala Lewandowsky, RN Phone Number: 05/15/2022, 12:51 PM    Transition of Care Department [facility]) has reviewed patient. Patient presented for Tikosyn Load. Patient is agreeable with co pay $3.13. Patient states the plan is for cardioversion Friday evening and he should be able to transition home. [facility] to [facility] bedside before 4:30 pm. If the patient is discharged on Saturday patient Rx's can be sent to [facility] and [facility]. No further needs identified at this time.

## 2022-05-15 NOTE — Progress Notes (Signed)
Pharmacy: Dofetilide (Tikosyn) - Follow Up Assessment and Electrolyte Replacement  Pharmacy consulted to assist in monitoring and replacing electrolytes in this 81 y.o. male admitted on 05/14/2022 undergoing dofetilide initiation.  Labs:    Component Value Date/Time   K 4.1 05/15/2022 0356   MG 2.2 05/15/2022 0356     Plan: Potassium: K >/= 4: No additional supplementation needed  Magnesium: Mg > 2: No additional supplementation needed    Thank you for allowing pharmacy to participate in this patient's care   Harland German, PharmD Clinical Pharmacist **Pharmacist phone directory can now be found on [email or web address] ([email or web address]).  Listed under [facility].

## 2022-05-15 NOTE — Progress Notes (Signed)
Subjective:  No specific complaints, tolerating the medications.  Intake/Output from previous day:  I/O last 3 completed shifts: In: 480 [P.O.:480] Out: -  No intake/output data recorded. Net IO Since Admission: 480 mL [05/15/22 0714]  Blood pressure 101/75, pulse 84, temperature 97.7 F (36.5 C), temperature source Oral, resp. rate 19, height _0  (1.778 m), weight 84.4 kg, SpO2 99 %. Physical Exam Neck:     Vascular: No carotid bruit or JVD.  Cardiovascular:     Rate and Rhythm: Normal rate. Rhythm irregular.     Pulses: Normal pulses and intact distal pulses.     Heart sounds: No murmur heard. Pulmonary:     Effort: Pulmonary effort is normal.     Breath sounds: Normal breath sounds.  Abdominal:     General: Bowel sounds are normal.     Palpations: Abdomen is soft.  Musculoskeletal:     Right lower leg: No edema.     Left lower leg: No edema.  Skin:    Capillary Refill: Capillary refill takes less than 2 seconds.     Lab Results: Lab Results  Component Value Date   NA 141 05/15/2022   K 4.1 05/15/2022   CO2 22 05/15/2022   GLUCOSE 95 05/15/2022   BUN 30 (H) 05/15/2022   CREATININE 1.73 (H) 05/15/2022   CALCIUM 8.9 05/15/2022   EGFR 35 (L) 05/12/2022   GFRNONAA 39 (L) 05/15/2022    Lab Results  Component Value Date   CREATININE 1.73 (H) 05/15/2022   CREATININE 1.71 (H) 05/14/2022   CREATININE 1.89 (H) 05/12/2022       Latest Ref Rng & Units 05/15/2022    3:56 AM 05/14/2022    5:26 PM 05/12/2022    8:37 AM  BMP  Glucose 70 - 99 mg/dL 95  96  91   BUN 8 - 23 mg/dL 30  29  35   Creatinine 0.61 - 1.24 mg/dL 1.73  1.71  1.89   BUN/Creat Ratio 10 - 24   19   Sodium 135 - 145 mmol/L 141  138  137   Potassium 3.5 - 5.1 mmol/L 4.1  4.3  4.7   Chloride 98 - 111 mmol/L 107  108  106   CO2 22 - 32 mmol/L _1 Calcium 8.9 - 10.3 mg/dL 8.9  9.0  9.1     Ref Range & Units 03:56 (05/15/22) 1 d ago (05/14/22) 3 d ago (05/12/22) 4 mo ago (12/30/21)  10 mo ago (07/08/21) 1 yr ago (01/04/21) 1 yr ago (07/10/20)   Magnesium 1.7 - 2.4 mg/dL 2.2 2.3 CM 2.3 R 2.1 CM 2.3 R 2.4 High  R 2.3 R      Latest Ref Rng & Units 12/30/2021    1:49 AM 12/28/2021    9:35 PM 01/04/2021   10:10 AM  Hepatic Function  Total Protein 6.5 - 8.1 g/dL 5.1  6.2  6.4   Albumin 3.5 - 5.0 g/dL 2.8  3.7  4.2   AST 15 - 41 U/L _2 ALT 0 - 44 U/L _3 Alk Phosphatase 38 - 126 U/L 36  49  55   Total Bilirubin 0.3 - 1.2 mg/dL 0.6  0.7  0.5       Latest Ref Rng & Units 05/12/2022    8:37 AM 02/25/2022    8:05 AM 12/30/2021    1:49 AM  CBC  WBC 3.4 - 10.8 x10E3/uL 4.6  5.1  6.8   Hemoglobin 13.0 - 17.7 g/dL 13.5  12.9  10.0   Hematocrit 37.5 - 51.0 % 42.4  40.4  29.8   Platelets 150 - 450 x10E3/uL 211  188  118    Lipid Panel     Component Value Date/Time   CHOL 134 07/10/2020 0817   TRIG 56 07/10/2020 0817   HDL 50 07/10/2020 0817   LDLCALC 72 07/10/2020 0817   TSH Recent Labs    05/15/22 0356  TSH 1.958    Imaging: No results found.  Cardiac Studies:  EKG: EKG 05/14/2022: Atrial fibrillation with controlled ventricular response at the rate of 98 bpm, normal axis, poor R wave progression, no evidence of ischemia.  QTc 400 ms.  EKG 05/15/2022: Atrial fibrillation with controlled ventricular sponsor rate of 86 bpm, low voltage complexes.  Normal QT interval.  Echocardiogram 05/15/22: 1. Diastolic function could not be assessed due to atrial fibrillation. Left ventricular ejection fraction, by estimation, is 60 to 65%. The left ventricle has normal function. The left ventricle has no regional wall motion abnormalities. Left  ventricular diastolic parameters are indeterminate.  2. Right ventricular systolic function is normal. The right ventricular size is normal.  3. Left atrial size was moderately dilated.  4. Right atrial size was mildly dilated.  5. The mitral valve is normal in structure. Trivial mitral valve regurgitation. No  evidence of mitral stenosis.  6. The aortic valve is normal in structure. Aortic valve regurgitation is not visualized. No aortic stenosis is present.  7. The inferior vena cava is normal in size with greater than 50% respiratory variability, suggesting right atrial pressure of 3 mmHg. Scheduled Meds:  apixaban  5 mg Oral BID   dofetilide  125 mcg Oral BID   finasteride  5 mg Oral Daily   lisinopril  2.5 mg Oral Daily   metoprolol tartrate  25 mg Oral BID   mirabegron ER  50 mg Oral Daily   psyllium  1 packet Oral Daily   rosuvastatin  5 mg Oral Daily   sodium chloride flush  3 mL Intravenous Q12H   tamsulosin  0.8 mg Oral QPM   Continuous Infusions:  sodium chloride     PRN Meds:.sodium chloride, sodium chloride flush  Assessment  Gregory [name] is a 81 y.o. male patient with  history of stage 3b CKD, hypertension, mild hyperlipidemia and moderate CAD by coronary CTA, paroxysmal atrial fibrillation. He has history of multiple cardioversions and eventually dofetilide was discontinued by Dr. [name] due to efficacy, and was placed on Multaq and has mostly maintained sinus rhythm however due to recurrent A-fib, underwent second ablation on 02/22/2019.   Again due to recurrent episodes of atrial fibrillation and multiple cardioversions, he was switched over to amiodarone again amiodarone having failed, he is now being admitted for Tikosyn administration as he prefers to try 1 more time.  Persistent atrial fibrillation Primary hypertension CRI stage 3b  Plan:   He is presently tolerating Tikosyn and EKG does not reveal QT prolongation. Will plan cardioversion Friday afternoon.  BP is well controlled and renal function is stable. I set him up for direct-current cardioversion tomorrow at 1 PM.   Will increase the dose of the Tikosyn to 250 mcg as per creatinine clearance.     Adrian Prows, MD, [facility] 05/15/2022, 7:14 AM Office: [phone] Fax: [phone] Pager: [phone]

## 2022-05-16 ENCOUNTER — Inpatient Hospital Stay (HOSPITAL_COMMUNITY): Payer: Medicare Other | Admitting: Anesthesiology

## 2022-05-16 ENCOUNTER — Encounter (HOSPITAL_COMMUNITY): Payer: Self-pay | Admitting: Cardiology

## 2022-05-16 ENCOUNTER — Encounter (HOSPITAL_COMMUNITY)
Admission: RE | Disposition: A | Discharge status: Home / Self Care | Payer: Self-pay | Source: Ambulatory Visit | Attending: Cardiology

## 2022-05-16 ENCOUNTER — Other Ambulatory Visit (HOSPITAL_COMMUNITY): Payer: Self-pay

## 2022-05-16 DIAGNOSIS — I4891 Unspecified atrial fibrillation: Secondary | ICD-10-CM

## 2022-05-16 HISTORY — PX: CARDIOVERSION: SHX1299

## 2022-05-16 LAB — BASIC METABOLIC PANEL
Anion gap: 8 (ref 5–15)
BUN: 34 mg/dL — ABNORMAL HIGH (ref 8–23)
CO2: 22 mmol/L (ref 22–32)
Calcium: 8.8 mg/dL — ABNORMAL LOW (ref 8.9–10.3)
Chloride: 109 mmol/L (ref 98–111)
Creatinine, Ser: 1.76 mg/dL — ABNORMAL HIGH (ref 0.61–1.24)
GFR, Estimated: 39 mL/min — ABNORMAL LOW (ref >60)
Glucose, Bld: 118 mg/dL — ABNORMAL HIGH (ref 70–99)
Potassium: 4 mmol/L (ref 3.5–5.1)
Sodium: 139 mmol/L (ref 135–145)

## 2022-05-16 LAB — MAGNESIUM: Magnesium: 2.2 mg/dL (ref 1.7–2.4)

## 2022-05-16 SURGERY — CARDIOVERSION
Anesthesia: General

## 2022-05-16 MED ORDER — PROPOFOL 10 MG/ML IV BOLUS
INTRAVENOUS | Status: DC | PRN
  Administered 2022-05-16: 50 mg via INTRAVENOUS

## 2022-05-16 MED ORDER — DOFETILIDE 250 MCG PO CAPS
250.0000 ug | ORAL_CAPSULE | Freq: Two times a day (BID) | ORAL | 2 refills | Status: DC
  Filled 2022-05-16: qty 60, 30d supply, fill #0

## 2022-05-16 MED ORDER — EPHEDRINE SULFATE-NACL 50-0.9 MG/10ML-% IV SOSY
PREFILLED_SYRINGE | INTRAVENOUS | Status: DC | PRN
  Administered 2022-05-16 (×2): 5 mg via INTRAVENOUS

## 2022-05-16 MED ORDER — LIDOCAINE 2% (20 MG/ML) 5 ML SYRINGE
INTRAMUSCULAR | Status: DC | PRN
  Administered 2022-05-16: 50 mg via INTRAVENOUS

## 2022-05-16 NOTE — Transfer of Care (Signed)
Immediate Anesthesia Transfer of Care Note  Patient: Gregory [name]  Procedure(s) Performed: CARDIOVERSION  Patient Location: Short Stay  Anesthesia Type:MAC  Level of Consciousness: awake and alert   Airway & Oxygen Therapy: Patient Spontanous Breathing  Post-op Assessment: Report given to RN and Post -op Vital signs reviewed and stable  Post vital signs: Reviewed and stable  Last Vitals:  Vitals Value Taken Time  BP 88/63   Temp    Pulse 46   Resp 12   SpO2 97     Last Pain:  Vitals:   05/16/22 1011  TempSrc: Temporal  PainSc: 0-No pain         Complications: No notable events documented.

## 2022-05-16 NOTE — Anesthesia Postprocedure Evaluation (Signed)
Anesthesia Post Note  Patient: Gregory [name]  Procedure(s) Performed: CARDIOVERSION     Patient location during evaluation: PACU Anesthesia Type: General Level of consciousness: awake and alert and oriented Pain management: pain level controlled Vital Signs Assessment: post-procedure vital signs reviewed and stable Respiratory status: spontaneous breathing, nonlabored ventilation and respiratory function stable Cardiovascular status: blood pressure returned to baseline and stable Postop Assessment: no apparent nausea or vomiting Anesthetic complications: no   No notable events documented.  Last Vitals:  Vitals:   05/16/22 1110 05/16/22 1115  BP: (!) 86/60 (!) 89/60  Pulse: (!) 48 (!) 48  Resp: 16 18  Temp:    SpO2: 97% 97%    Last Pain:  Vitals:   05/16/22 1106  TempSrc:   PainSc: 0-No pain                 Clary Meeker A.

## 2022-05-16 NOTE — Anesthesia Preprocedure Evaluation (Signed)
Anesthesia Evaluation  Patient identified by MRN, date of birth, ID band Patient awake    Reviewed: Allergy & Precautions, NPO status , Patient's Chart, lab work & pertinent test results, reviewed documented beta blocker date and time   Airway Mallampati: II  TM Distance: >3 FB Neck ROM: Full    Dental no notable dental hx. (+) Teeth Intact, Dental Advisory Given   Pulmonary pneumonia, resolved   Pulmonary exam normal breath sounds clear to auscultation       Cardiovascular Exercise Tolerance: Good hypertension, Pt. on medications and Pt. on home beta blockers +CHF  Normal cardiovascular exam+ dysrhythmias Atrial Fibrillation  Rhythm:Regular Rate:Normal     Neuro/Psych  Neuromuscular disease  negative psych ROS   GI/Hepatic negative GI ROS, Neg liver ROS,,,  Endo/Other  Hyperlipidemia   Renal/GU Renal Insufficiency and CRFRenal disease  negative genitourinary   Musculoskeletal  (+) Arthritis , Osteoarthritis,    Abdominal   Peds negative pediatric ROS (+)  Hematology Eliquis therapy- last dose this am   Anesthesia Other Findings   Reproductive/Obstetrics                              Anesthesia Physical Anesthesia Plan  ASA: 2  Anesthesia Plan: General   Post-op Pain Management:    Induction: Intravenous  PONV Risk Score and Plan: 2  Airway Management Planned: Mask  Additional Equipment: None  Intra-op Plan:   Post-operative Plan:   Informed Consent: I have reviewed the patients History and Physical, chart, labs and discussed the procedure including the risks, benefits and alternatives for the proposed anesthesia with the patient or authorized representative who has indicated his/her understanding and acceptance.     Dental advisory given  Plan Discussed with: Anesthesiologist and CRNA  Anesthesia Plan Comments:          Anesthesia Quick Evaluation

## 2022-05-16 NOTE — Telephone Encounter (Signed)
From patient.

## 2022-05-16 NOTE — Progress Notes (Signed)
Left the unit accompanied by RN. Alert and oriented x 4. Denied chest pain. Not in distress. Discharge instruction given by Porfirio Oar, RN.

## 2022-05-16 NOTE — Plan of Care (Signed)
?  Problem: Education: ?Goal: Knowledge of General Education information will improve ?Description: Including pain rating scale, medication(s)/side effects and non-pharmacologic comfort measures ?Outcome: Adequate for Discharge ?  ?Problem: Health Behavior/Discharge Planning: ?Goal: Ability to manage health-related needs will improve ?Outcome: Adequate for Discharge ?  ?Problem: Clinical Measurements: ?Goal: Ability to maintain clinical measurements within normal limits will improve ?Outcome: Adequate for Discharge ?Goal: Will remain free from infection ?Outcome: Adequate for Discharge ?Goal: Diagnostic test results will improve ?Outcome: Adequate for Discharge ?Goal: Respiratory complications will improve ?Outcome: Adequate for Discharge ?Goal: Cardiovascular complication will be avoided ?Outcome: Adequate for Discharge ?  ?Problem: Activity: ?Goal: Risk for activity intolerance will decrease ?Outcome: Adequate for Discharge ?  ?Problem: Safety: ?Goal: Ability to remain free from injury will improve ?Outcome: Adequate for Discharge ?  ?

## 2022-05-16 NOTE — Progress Notes (Signed)
Pharmacy: Dofetilide (Tikosyn) - Follow Up Assessment and Electrolyte Replacement  Pharmacy consulted to assist in monitoring and replacing electrolytes in this 81 y.o. male admitted on 05/14/2022 undergoing dofetilide initiation.   Labs:    Component Value Date/Time   K 4.0 05/16/2022 0247   MG 2.2 05/16/2022 0247     Plan: Potassium: K >/= 4: No additional supplementation needed  Magnesium: Mg > 2: No additional supplementation needed   Thank you for allowing pharmacy to participate in this patient's care   Harland German, PharmD Clinical Pharmacist **Pharmacist phone directory can now be found on [email or web address] ([email or web address]).  Listed under [facility].

## 2022-05-16 NOTE — Anesthesia Procedure Notes (Signed)
Procedure Name: General with mask airway Date/Time: 05/16/2022 10:53 AM  Performed by: Katina Degree, CRNAPre-anesthesia Checklist: Patient identified, Emergency Drugs available, Suction available and Patient being monitored Patient Re-evaluated:Patient Re-evaluated prior to induction Oxygen Delivery Method: Ambu bag Preoxygenation: Pre-oxygenation with 100% oxygen Induction Type: IV induction Dental Injury: Teeth and Oropharynx as per pre-operative assessment

## 2022-05-16 NOTE — Care Management Important Message (Signed)
Important Message  Patient Details  Name: Gregory [name] MRN: [id number] Date of Birth: 12-25-1940   Medicare Important Message Given:  Yes     Gregory [name] 05/16/2022, 9:12 AM

## 2022-05-16 NOTE — Progress Notes (Signed)
EKG review by Dr. Jacinto Halim. May discharge patient per order.

## 2022-05-16 NOTE — Discharge Summary (Signed)
Physician Discharge Summary  Patient ID: Gregory [name] MRN: [id number] DOB/AGE: Aug 16, 1940 81 [facility]o. [name]   Admit date: 05/14/2022 Discharge date: 05/16/2022  Primary Discharge Diagnosis 1.  Persistent atrial fibrillation 2.  Therapeutic drug administration monitoring 3.  Stage IIIb chronic kidney disease 4.  Primary hypertension   Significant Diagnostic Studies:  EKG 05/16/2022 at 1912 hrs.: Sinus rhythm with first-degree AV block at rate of 61 bpm, otherwise normal EKG.  Normal QT interval.  Echocardiogram 05/15/2022:  1. Diastolic function could not be assessed due to atrial fibrillation. Left ventricular ejection fraction, by estimation, is 60 to 65%. The left ventricle has normal function. The left ventricle has no regional wall motion abnormalities. Left  ventricular diastolic parameters are indeterminate.  2. Right ventricular systolic function is normal. The right ventricular size is normal.  3. Left atrial size was moderately dilated.  4. Right atrial size was mildly dilated.  5. The mitral valve is normal in structure. Trivial mitral valve regurgitation. No evidence of mitral stenosis.  6. The aortic valve is normal in structure. Aortic valve regurgitation is not visualized. No aortic stenosis is present.  7. The inferior vena cava is normal in size with greater than 50% respiratory variability, suggesting right atrial pressure of 3 mmHg.  Hospital Course: Gregory [name] is a 81 y.o. male  patient admitted on 05/14/2022 for therapeutic drug administration monitoring.  In view of recurrent symptomatic atrial fibrillation, failed Multaq, amiodarone, previously had tried Tikosyn but wanted to have another attempt with Tikosyn due to symptoms, he has had 2 atrial fibrillation ablations already.  He had no untoward side effects, no changes in the QT interval post 5 doses of Tikosyn.  He underwent direct-current cardioversion this  morning on 05/16/2022 successfully maintaining sinus rhythm this evening and asymptomatic hence felt stable for discharge.  Recommendations on discharge: Eliquis dose was reduced to 2.5 mg twice daily in view of his age 13 years and his creatinine clearance.  Discharge Exam:    05/16/2022    7:39 PM 05/16/2022    3:09 PM 05/16/2022    2:52 PM  Vitals with BMI  Systolic [id number] 96 95  Diastolic 64 66 63  Pulse 61      Physical Exam Neck:     Vascular: No carotid bruit or JVD.  Cardiovascular:     Rate and Rhythm: Normal rate and regular rhythm.     Pulses: Intact distal pulses.     Heart sounds: Normal heart sounds. No murmur heard.    No gallop.  Pulmonary:     Effort: Pulmonary effort is normal.     Breath sounds: Normal breath sounds.  Abdominal:     General: Bowel sounds are normal.     Palpations: Abdomen is soft.  Musculoskeletal:     Right lower leg: No edema.     Left lower leg: No edema.     Labs:   Lab Results  Component Value Date   WBC 4.6 05/12/2022   HGB 13.5 05/12/2022   HCT 42.4 05/12/2022   MCV 99 (H) 05/12/2022   PLT 211 05/12/2022    Recent Labs  Lab 05/16/22 0247  NA 139  K 4.0  CL 109  CO2 22  BUN 34*  CREATININE 1.76*  CALCIUM 8.8*  GLUCOSE 118*    Lipid Panel     Component Value Date/Time   CHOL 134 07/10/2020 0817   TRIG 56 07/10/2020 0817   HDL 50 07/10/2020 0817  LDLCALC 72 07/10/2020 0817    BNP (last 3 results) No results for input(s): "BNP" in the last 8760 hours.  HEMOGLOBIN A1C No results found for: "HGBA1C", "MPG"  Cardiac Panel (last 3 results) No results for input(s): "CKTOTAL", "CKMB", "TROPONINIHS", "RELINDX" in the last 72 hours.   TSH Recent Labs    05/15/22 0356  TSH 1.958    FOLLOW UP PLANS AND APPOINTMENTS  Allergies as of 05/16/2022   No Known Allergies      Medication List     TAKE these medications    acetaminophen 500 MG tablet Commonly known as: TYLENOL Take 500-1,000 mg by mouth 2  (two) times daily as needed for mild pain, moderate pain or headache.   apixaban 2.5 MG Tabs tablet Commonly known as: ELIQUIS Take 1 tablet (2.5 mg total) by mouth 2 (two) times daily. What changed:  medication strength how much to take   dofetilide 250 MCG capsule Commonly known as: TIKOSYN Take 1 capsule (250 mcg total) by mouth 2 (two) times daily.   finasteride 5 MG tablet Commonly known as: PROSCAR Take 5 mg by mouth daily.   lisinopril 5 MG tablet Commonly known as: ZESTRIL TAKE 1 TABLET(5 MG) BY MOUTH DAILY What changed: See the new instructions.   metoprolol tartrate 25 MG tablet Commonly known as: LOPRESSOR Take 0.5 tablets (12.5 mg total) by mouth 2 (two) times daily. What changed: how much to take   MiraLax 17 GM/SCOOP powder Generic drug: polyethylene glycol powder Take 17 g by mouth daily as needed for mild constipation.   Multivitamin Adults 50+ Tabs Take 1 tablet by mouth 2 (two) times a week.   Myrbetriq 50 MG Tb24 tablet Generic drug: mirabegron ER Take 50 mg by mouth daily.   psyllium 58.6 % packet Commonly known as: METAMUCIL Take 1 packet by mouth 3 (three) times a week.   rosuvastatin 5 MG tablet Commonly known as: CRESTOR TAKE 1 TABLET(5 MG) BY MOUTH DAILY What changed: See the new instructions.   tamsulosin 0.4 MG Caps capsule Commonly known as: FLOMAX Take 0.8 mg by mouth daily.        Follow-up Information     [name], MD Follow up on 06/02/2022.   Specialty: Cardiology Why: 06/02/2022 10:45 AM Contact information: [address] [place] [address] [phone]                  [name], MD, [facility] 05/16/2022, 7:48 PM Office: [phone]

## 2022-05-16 NOTE — CV Procedure (Signed)
Direct current cardioversion 05/16/2022 11:04 AM  Indication symptomatic A. Fibrillation.  Procedure: Using 50 mg of IV Propofol and 50 IV Lidocaine (for reducing venous pain) for achieving deep sedation, synchronized direct current cardioversion performed. Patient was delivered with 150 Joules of electricity X 1 with success to NSR. Patient tolerated the procedure well. No immediate complication noted.   Allergies as of 05/16/2022   No Known Allergies      Medication List     TAKE these medications    acetaminophen 500 MG tablet Commonly known as: TYLENOL Take 500-1,000 mg by mouth 2 (two) times daily as needed for mild pain, moderate pain or headache.   apixaban 2.5 MG Tabs tablet Commonly known as: ELIQUIS Take 1 tablet (2.5 mg total) by mouth 2 (two) times daily. What changed:  medication strength how much to take   dofetilide 250 MCG capsule Commonly known as: TIKOSYN Take 1 capsule (250 mcg total) by mouth 2 (two) times daily.   finasteride 5 MG tablet Commonly known as: PROSCAR Take 5 mg by mouth daily.   lisinopril 5 MG tablet Commonly known as: ZESTRIL TAKE 1 TABLET(5 MG) BY MOUTH DAILY What changed: See the new instructions.   metoprolol tartrate 25 MG tablet Commonly known as: LOPRESSOR Take 0.5 tablets (12.5 mg total) by mouth 2 (two) times daily. What changed: how much to take   MiraLax 17 GM/SCOOP powder Generic drug: polyethylene glycol powder Take 17 g by mouth daily as needed for mild constipation.   Multivitamin Adults 50+ Tabs Take 1 tablet by mouth 2 (two) times a week.   Myrbetriq 50 MG Tb24 tablet Generic drug: mirabegron ER Take 50 mg by mouth daily.   psyllium 58.6 % packet Commonly known as: METAMUCIL Take 1 packet by mouth 3 (three) times a week.   rosuvastatin 5 MG tablet Commonly known as: CRESTOR TAKE 1 TABLET(5 MG) BY MOUTH DAILY What changed: See the new instructions.   tamsulosin 0.4 MG Caps capsule Commonly known as:  FLOMAX Take 0.8 mg by mouth daily.          [name], MD, [facility] 05/16/2022, 11:04 AM Office: [phone] Fax: [phone] Pager: [phone]

## 2022-05-19 ENCOUNTER — Encounter (HOSPITAL_COMMUNITY): Payer: Self-pay | Admitting: Cardiology

## 2022-05-22 NOTE — Telephone Encounter (Signed)
Patient ha called and wanted Gregory [name] to visit him while in the hospital.

## 2022-06-02 ENCOUNTER — Encounter: Payer: Self-pay | Admitting: Cardiology

## 2022-06-02 ENCOUNTER — Ambulatory Visit: Payer: Medicare Other | Admitting: Cardiology

## 2022-06-02 VITALS — BP 115/73 | HR 54 | Resp 16 | Ht 70.0 in | Wt 187.0 lb

## 2022-06-02 DIAGNOSIS — Z5181 Encounter for therapeutic drug level monitoring: Secondary | ICD-10-CM

## 2022-06-02 DIAGNOSIS — N1832 Chronic kidney disease, stage 3b: Secondary | ICD-10-CM

## 2022-06-02 DIAGNOSIS — I10 Essential (primary) hypertension: Secondary | ICD-10-CM

## 2022-06-02 DIAGNOSIS — I480 Paroxysmal atrial fibrillation: Secondary | ICD-10-CM

## 2022-06-02 MED ORDER — DOFETILIDE 250 MCG PO CAPS: 250.0000 ug | ORAL_CAPSULE | Freq: Two times a day (BID) | ORAL | 3 refills | Status: DC

## 2022-06-02 NOTE — Progress Notes (Signed)
Primary Physician/Referring:  [name]  Patient ID: Gregory [name], male    DOB: [birth year], 81 y.o.   MRN: [id number]  Chief Complaint  Patient presents with   Atrial Fibrillation     HPI:    Gregory [name]  is a 81 y.o. Caucasian male with  history of stage 3b CKD, hypertension, mild hyperlipidemia and moderate CAD by coronary CTA, paroxysmal atrial fibrillation. Atrial fibrillation ablation on 11/28/2016 and second ablation on 02/22/2019, multiple cardioversions and failed Multaq, amiodarone and previously Tikosyn, but wanted to try Tikosyn again and hence patient was admitted for Tikosyn administration on 05/14/2022, underwent direct-current cardioversion successfully to sinus rhythm, discharged home 2 days later.  He now presents for follow-up.    He is presently asymptomatic and is doing well.  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 01/12/2020   Arthritis    "right knee" (01/28/2016)   Atypical atrial flutter ([place]) 05/17/2019   Closed right hip fracture ([place]) 12/28/2021   Diverticulosis of colon (without mention of hemorrhage)    Hyperlipidemia    Hypertension    Internal hemorrhoids without mention of complication    Personal history of colonic polyps 01/17/2002   adenomatous, tublar adenoma 02/10/2007   PNA (pneumonia) 03/31/2012   Social History   Tobacco Use   Smoking status: Never   Smokeless tobacco: Never  Substance Use Topics   Alcohol use: Yes    Alcohol/week: 1.0 standard drink of alcohol    Types: 1 Glasses of wine per week    Comment: daily   Marital Status: Married  ROS  Review of Systems  Cardiovascular:  Negative for chest pain, dyspnea on exertion and leg swelling.   Objective  Blood pressure 115/73, pulse (!) 54, resp. rate 16, height _0  (1.778 m), weight 187 lb (84.8 kg), SpO2 98 %.     06/02/2022   10:48 AM 05/16/2022    7:39 PM 05/16/2022     3:09 PM  Vitals with BMI  Height _1     Weight 187 lbs    BMI 15.17    Systolic 616 073 96  Diastolic 73 64 66  Pulse 54 61      Physical Exam Constitutional:      Appearance: He is well-developed.  Neck:     Vascular: No carotid bruit or JVD.  Cardiovascular:     Rate and Rhythm: Regular rhythm. Bradycardia present.     Pulses: Intact distal pulses.     Heart sounds: No murmur heard.    No gallop.  Pulmonary:     Effort: Pulmonary effort is normal. No accessory muscle usage or respiratory distress.     Breath sounds: Normal breath sounds.  Abdominal:     General: Bowel sounds are normal.     Palpations: Abdomen is soft.  Musculoskeletal:     Right lower leg: No edema.     Left lower leg: No edema.    Laboratory examination:   Recent Labs    05/14/22 1726 05/15/22 0356 05/16/22 0247  NA 138 141 139  K 4.3 4.1 4.0  CL 108 107 109  CO2 _2 GLUCOSE 96 95 118*  BUN 29* 30* 34*  CREATININE 1.71* 1.73* 1.76*  CALCIUM 9.0 8.9 8.8*  GFRNONAA 40* 39* 39*      Latest Ref Rng & Units 05/16/2022    2:47 AM 05/15/2022  3:56 AM 05/14/2022    5:26 PM  CMP  Glucose 70 - 99 mg/dL 118  95  96   BUN 8 - 23 mg/dL 34  30  29   Creatinine 0.61 - 1.24 mg/dL 1.76  1.73  1.71   Sodium 135 - 145 mmol/L 139  141  138   Potassium 3.5 - 5.1 mmol/L 4.0  4.1  4.3   Chloride 98 - 111 mmol/L 109  107  108   CO2 22 - 32 mmol/L _0 Calcium 8.9 - 10.3 mg/dL 8.8  8.9  9.0       Latest Ref Rng & Units 05/12/2022    8:37 AM 02/25/2022    8:05 AM 12/30/2021    1:49 AM  CBC  WBC 3.4 - 10.8 x10E3/uL 4.6  5.1  6.8   Hemoglobin 13.0 - 17.7 g/dL 13.5  12.9  10.0   Hematocrit 37.5 - 51.0 % 42.4  40.4  29.8   Platelets 150 - 450 x10E3/uL 211  188  118    Lipid Panel     Component Value Date/Time   CHOL 134 07/10/2020 0817   TRIG 56 07/10/2020 0817   HDL 50 07/10/2020 0817   LDLCALC 72 07/10/2020 0817   HEMOGLOBIN A1C No results found for: "HGBA1C",  "MPG" TSH 06/18/2018: TSH 1.69.   External labs:   Labs 12/23/2021:  Total cholesterol 127, triglycerides 65, HDL 53, LDL 60.  Medications and allergies  No Known Allergies   Current Outpatient Medications:    acetaminophen (TYLENOL) 500 MG tablet, Take 500-1,000 mg by mouth 2 (two) times daily as needed for mild pain, moderate pain or headache., Disp: , Rfl:    apixaban (ELIQUIS) 2.5 MG TABS tablet, Take 1 tablet (2.5 mg total) by mouth 2 (two) times daily., Disp: , Rfl:    finasteride (PROSCAR) 5 MG tablet, Take 5 mg by mouth daily., Disp: , Rfl:    lisinopril (ZESTRIL) 5 MG tablet, TAKE 1 TABLET(5 MG) BY MOUTH DAILY (Patient taking differently: Take 5 mg by mouth daily.), Disp: 90 tablet, Rfl: 1   MIRALAX 17 GM/SCOOP powder, Take 17 g by mouth daily as needed for mild constipation., Disp: , Rfl:    Multiple Vitamins-Minerals (MULTIVITAMIN ADULTS 50+) TABS, Take 1 tablet by mouth 2 (two) times a week., Disp: , Rfl:    MYRBETRIQ 50 MG TB24 tablet, Take 50 mg by mouth daily., Disp: , Rfl:    psyllium (METAMUCIL) 58.6 % packet, Take 1 packet by mouth 3 (three) times a week., Disp: , Rfl:    rosuvastatin (CRESTOR) 5 MG tablet, TAKE 1 TABLET(5 MG) BY MOUTH DAILY (Patient taking differently: Take 5 mg by mouth at bedtime.), Disp: 90 tablet, Rfl: 3   tamsulosin (FLOMAX) 0.4 MG CAPS capsule, Take 0.8 mg by mouth daily., Disp: , Rfl:    dofetilide (TIKOSYN) 250 MCG capsule, Take 1 capsule (250 mcg total) by mouth 2 (two) times daily., Disp: 180 capsule, Rfl: 3   metoprolol tartrate (LOPRESSOR) 25 MG tablet, Take 0.5 tablets (12.5 mg total) by mouth daily., Disp: 60 tablet, Rfl: 2    Radiology:   No results found.  Cardiac Studies:   Treadmill stress test  [10/09/2014]: Indication: Atrial Fibrillation Resting EKG SR with 1st degree AV Block, LAD, LAFB, RBBB. Trifascicular block. Normal BP response. There was no ST-T changes of ischemia with exercise stress test. No stress induced arrhythmias.  Stress terminated due to THR (>85% MPHR)/MPHR met.  The patient exercised according to Bruce Protocol, Total time recorded 06:50 min achieving max heart rate of 158 which was 106% of MPHR for age and 8.34 METS of work.  Coronary CTA 03/06/2019: 1. Coronary artery calcium score 1837 Agatston units. This places the patient in the 86th percentile for age and gender, suggesting high risk for future cardiac events. 2. Extensive calcified plaque throughout the coronary system. Mild stenosis for the most part, possibly around 50% stenosis in the distal RCA and the proximal LCx (small vessel). However, difficult to quantify given extensive calcification with possibility of blooming artifact. Will send for FFR. 3.  Pulmonary veins without stenosis.  4.  No LA appendage thrombus seen.  FFR 0.9 mid LAD, FFR 0.93 distal RCA, FFR 0.94 mid LCx: No evidence for hemodynamically significant stenosis.  Echocardiogram 05/15/2022:  1. Diastolic function could not be assessed due to atrial fibrillation. Left ventricular ejection fraction, by estimation, is 60 to 65%. The left ventricle has normal function. The left ventricle has no regional wall motion abnormalities. Left  ventricular diastolic parameters are indeterminate.  2. Right ventricular systolic function is normal. The right ventricular size is normal.  3. Left atrial size was moderately dilated.  4. Right atrial size was mildly dilated.  5. The mitral valve is normal in structure. Trivial mitral valve regurgitation. No evidence of mitral stenosis.  6. The aortic valve is normal in structure. Aortic valve regurgitation is not visualized. No aortic stenosis is present.  7. The inferior vena cava is normal in size with greater than 50% respiratory variability, suggesting right atrial pressure of 3 mmHg.  EKG:  EKG 06/02/2022: Marked sinus bradycardia with first-degree AV block at rate of 46 bpm, normal axis, no evidence of ischemia.  Normal QT interval.   Compared to 05/13/2022, atrial fibrillation with RVR has now been replaced by sinus.  EKG 02/21/2022: Sinus rhythm with first-degree AV block at rate of 56 bpm, otherwise normal EKG.  Normal QT interval.  No change from 01/30/2022 and 07/08/2021.  Assessment     ICD-10-CM   1. Paroxysmal atrial fibrillation (HCC)  I48.0 EKG 12-Lead    dofetilide (TIKOSYN) 250 MCG capsule    metoprolol tartrate (LOPRESSOR) 25 MG tablet    2. Therapeutic drug monitoring  Z51.81     3. Essential hypertension  I10     4. Stage 3b chronic kidney disease (HCC)  N18.32      CHA2DS2-VASc Score is 4.  Yearly risk of stroke: 5% (A, HTN, CAD).  Score of 1=0.6; 2=2.2; 3=3.2; 4=4.8; 5=7.2; 6=9.8; 7=>9.8) -(CHF; HTN; vasc disease DM,  Male = 1; Age <65 =0; 65-74 = 1,  >75 =2; stroke/embolism= 2).    Medications Discontinued During This Encounter  Medication Reason   dofetilide (TIKOSYN) 250 MCG capsule Reorder   metoprolol tartrate (LOPRESSOR) 25 MG tablet Reorder     Meds ordered this encounter  Medications   dofetilide (TIKOSYN) 250 MCG capsule    Sig: Take 1 capsule (250 mcg total) by mouth 2 (two) times daily.    Dispense:  180 capsule    Refill:  3      Recommendations:   Gregory [name]  is a 81 y.o. Caucasian male with  history of stage 3b CKD, hypertension, mild hyperlipidemia and moderate CAD by coronary CTA, paroxysmal atrial fibrillation. Atrial fibrillation ablation on 11/28/2016 and second ablation on 02/22/2019, multiple cardioversions and failed Multaq, amiodarone and previously Tikosyn, but wanted to try Tikosyn again and hence patient was  admitted for Tikosyn administration on 05/14/2022, underwent direct-current cardioversion successfully to sinus rhythm, discharged home 2 days later.  He now presents for follow-up.   1. Paroxysmal atrial fibrillation [facility]) Patient is presently maintaining sinus rhythm.  Due to marked sinus bradycardia, his advanced age, will reduce metoprolol to tartrate  from 12.5 mg twice daily to daily dose.  2. Therapeutic drug monitoring Patient will need close monitoring of his medications especially dofetilide.  He is very compliant patient.  I would like to see him back in 3 months with repeat EKG.  3. Essential hypertension Blood pressure is under excellent control.  Continue present medications.  4. Stage 3b chronic kidney disease ([place]) In view of stage III chronic kidney disease, he is on 250 mcg of dofetilide twice daily, continue the same.  QT is normal.    Adrian Prows, MD, [facility] 06/02/2022, 11:09 AM Office: [phone] Pager: [phone]

## 2022-07-06 ENCOUNTER — Encounter: Payer: Self-pay | Admitting: Cardiology

## 2022-07-07 NOTE — Telephone Encounter (Signed)
From patient

## 2022-07-08 NOTE — Telephone Encounter (Signed)
From patient

## 2022-07-30 ENCOUNTER — Other Ambulatory Visit: Payer: Self-pay | Admitting: Cardiology

## 2022-07-30 DIAGNOSIS — I480 Paroxysmal atrial fibrillation: Secondary | ICD-10-CM

## 2022-08-05 ENCOUNTER — Encounter: Payer: Self-pay | Admitting: Cardiology

## 2022-08-05 DIAGNOSIS — N1832 Chronic kidney disease, stage 3b: Secondary | ICD-10-CM

## 2022-08-05 NOTE — Telephone Encounter (Signed)
From patient

## 2022-08-06 NOTE — Telephone Encounter (Signed)
ICD-10-CM   1. Stage 3b chronic kidney disease ([place])  N18.32 Ambulatory referral to Nephrology     Orders Placed This Encounter  Procedures   Ambulatory referral to Nephrology    Referral Priority:   Routine    Referral Type:   Consultation    Referral Reason:   Specialty Services Required    Referred to Provider:   [place], [place] [facility]    Requested Specialty:   Nephrology    Number of Visits Requested:   1    Adrian Prows, MD, [facility] 08/06/2022, 7:43 AM Office: [phone] Fax: [phone] Pager: [phone]

## 2022-08-18 ENCOUNTER — Encounter: Payer: Self-pay | Admitting: Cardiology

## 2022-08-18 NOTE — Telephone Encounter (Signed)
FROM PATIENT

## 2022-08-20 ENCOUNTER — Other Ambulatory Visit: Payer: Self-pay | Admitting: Cardiology

## 2022-08-20 DIAGNOSIS — N1832 Chronic kidney disease, stage 3b: Secondary | ICD-10-CM

## 2022-08-25 ENCOUNTER — Encounter: Payer: Self-pay | Admitting: Cardiology

## 2022-08-25 ENCOUNTER — Encounter: Payer: Self-pay | Admitting: Podiatry

## 2022-08-25 ENCOUNTER — Ambulatory Visit: Payer: Medicare Other | Admitting: Cardiology

## 2022-08-25 VITALS — BP 103/72 | HR 66 | Resp 16 | Ht 70.0 in | Wt 186.0 lb

## 2022-08-25 DIAGNOSIS — I10 Essential (primary) hypertension: Secondary | ICD-10-CM

## 2022-08-25 DIAGNOSIS — I480 Paroxysmal atrial fibrillation: Secondary | ICD-10-CM

## 2022-08-25 DIAGNOSIS — N1832 Chronic kidney disease, stage 3b: Secondary | ICD-10-CM

## 2022-08-25 MED ORDER — METOPROLOL TARTRATE 25 MG PO TABS: 25.0000 mg | ORAL_TABLET | Freq: Two times a day (BID) | ORAL | 2 refills | Status: DC

## 2022-08-25 NOTE — Telephone Encounter (Signed)
From patient.

## 2022-08-25 NOTE — Progress Notes (Signed)
Primary Physician/Referring:  [name]  Patient ID: Gregory [name], male    DOB: 1940/11/05, 82 y.o.   MRN: [id number]  Chief Complaint  Patient presents with   Atrial Fibrillation   Hypertension   Chronic Renal Failure   Follow-up    6 months     HPI:    Gregory [name]  is a 82 y.o. Caucasian male with  history of stage 3b CKD, hypertension, mild hyperlipidemia and moderate CAD by coronary CTA, paroxysmal atrial fibrillation. Atrial fibrillation ablation on 11/28/2016 and second ablation on 02/22/2019, multiple cardioversions and failed Multaq, amiodarone and previously Tikosyn, but wanted to try Tikosyn again and hence patient was admitted for Tikosyn administration on 05/14/2022, underwent direct-current cardioversion successfully to sinus rhythm, discharged home 2 days later.    Patient has had frequent episodes of atrial fibrillation and had called [place] and also states that about 70 to 80% of the time she is in atrial fibrillation.  He does feel shortness of breath with atrial fibrillation but overall states that he is doing "okay".  No PND or orthopnea, no leg edema.  No chest pain.  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 01/12/2020   Arthritis    "right knee" (01/28/2016)   Atypical atrial flutter ([place]) 05/17/2019   Closed right hip fracture ([place]) 12/28/2021   Diverticulosis of colon (without mention of hemorrhage)    Hyperlipidemia    Hypertension    Internal hemorrhoids without mention of complication    Personal history of colonic polyps 01/17/2002   adenomatous, tublar adenoma 02/10/2007   PNA (pneumonia) 03/31/2012   Social History   Tobacco Use   Smoking status: Never   Smokeless tobacco: Never  Substance Use Topics   Alcohol use: Yes    Alcohol/week: 1.0 standard drink of alcohol    Types: 1 Glasses of wine per week    Comment: daily   Marital Status: Married   ROS  Review of Systems  Cardiovascular:  Positive for dyspnea on exertion. Negative for chest pain and leg swelling.   Objective  Blood pressure 103/72, pulse 66, resp. rate 16, height '5\' 10"'$  (1.778 m), weight 186 lb (84.4 kg), SpO2 98 %.     08/25/2022   10:01 AM 06/02/2022   10:48 AM 05/16/2022    7:39 PM  Vitals with BMI  Height '5\' 10"'$  '5\' 10"'$    Weight 186 lbs 187 lbs   BMI [id number]   Systolic [id number]  Diastolic 72 73 64  Pulse 66 54 61     Physical Exam Constitutional:      Appearance: He is well-developed.  Neck:     Vascular: No carotid bruit or JVD.  Cardiovascular:     Rate and Rhythm: Tachycardia present. Rhythm irregular.     Pulses: Intact distal pulses.     Heart sounds: No murmur heard.    No gallop.  Pulmonary:     Effort: Pulmonary effort is normal. No accessory muscle usage or respiratory distress.     Breath sounds: Normal breath sounds.  Abdominal:     General: Bowel sounds are normal.     Palpations: Abdomen is soft.  Musculoskeletal:     Right lower leg: No edema.     Left lower leg: No edema.    Laboratory examination:   Recent Labs    05/14/22 1726 05/15/22 0356 05/16/22 0247  NA 138  141 139  K 4.3 4.1 4.0  CL 108 107 109  CO2 '22 22 22  '$ GLUCOSE 96 95 118*  BUN 29* 30* 34*  CREATININE 1.71* 1.73* 1.76*  CALCIUM 9.0 8.9 8.8*  GFRNONAA 40* 39* 39*      Latest Ref Rng & Units 05/16/2022    2:47 AM 05/15/2022    3:56 AM 05/14/2022    5:26 PM  CMP  Glucose 70 - 99 mg/dL 118  95  96   BUN 8 - 23 mg/dL 34  30  29   Creatinine 0.61 - 1.24 mg/dL 1.76  1.73  1.71   Sodium 135 - 145 mmol/L 139  141  138   Potassium 3.5 - 5.1 mmol/L 4.0  4.1  4.3   Chloride 98 - 111 mmol/L 109  107  108   CO2 22 - 32 mmol/L '22  22  22   '$ Calcium 8.9 - 10.3 mg/dL 8.8  8.9  9.0       Latest Ref Rng & Units 05/12/2022    8:37 AM 02/25/2022    8:05 AM 12/30/2021    1:49 AM  CBC  WBC 3.4 - 10.8 x10E3/uL 4.6  5.1  6.8   Hemoglobin 13.0 - 17.7 g/dL  13.5  12.9  10.0   Hematocrit 37.5 - 51.0 % 42.4  40.4  29.8   Platelets 150 - 450 x10E3/uL 211  188  118    Lipid Panel     Component Value Date/Time   CHOL 134 07/10/2020 0817   TRIG 56 07/10/2020 0817   HDL 50 07/10/2020 0817   LDLCALC 72 07/10/2020 0817   HEMOGLOBIN A1C No results found for: "HGBA1C", "MPG" TSH 06/18/2018: TSH 1.69.   External labs:   Labs 12/23/2021:  Total cholesterol 127, triglycerides 65, HDL 53, LDL 60.  Medications and allergies  No Known Allergies   Current Outpatient Medications:    acetaminophen (TYLENOL) 500 MG tablet, Take 500-1,000 mg by mouth 2 (two) times daily as needed for mild pain, moderate pain or headache., Disp: , Rfl:    apixaban (ELIQUIS) 2.5 MG TABS tablet, Take 1 tablet (2.5 mg total) by mouth 2 (two) times daily., Disp: , Rfl:    finasteride (PROSCAR) 5 MG tablet, Take 5 mg by mouth daily., Disp: , Rfl:    lisinopril (ZESTRIL) 5 MG tablet, TAKE 1 TABLET(5 MG) BY MOUTH DAILY, Disp: 90 tablet, Rfl: 1   MIRALAX 17 GM/SCOOP powder, Take 17 g by mouth daily as needed for mild constipation., Disp: , Rfl:    metoprolol tartrate (LOPRESSOR) 25 MG tablet, Take 1 tablet (25 mg total) by mouth 2 (two) times daily. TAKE 1 TABLET(25 MG) BY MOUTH TWICE DAILY Strength: 25 mg, Disp: 60 tablet, Rfl: 2   Multiple Vitamins-Minerals (MULTIVITAMIN ADULTS 50+) TABS, Take 1 tablet by mouth 2 (two) times a week., Disp: , Rfl:    MYRBETRIQ 50 MG TB24 tablet, Take 50 mg by mouth daily., Disp: , Rfl:    psyllium (METAMUCIL) 58.6 % packet, Take 1 packet by mouth 3 (three) times a week., Disp: , Rfl:    rosuvastatin (CRESTOR) 5 MG tablet, TAKE 1 TABLET(5 MG) BY MOUTH DAILY (Patient taking differently: Take 5 mg by mouth at bedtime.), Disp: 90 tablet, Rfl: 3   tamsulosin (FLOMAX) 0.4 MG CAPS capsule, Take 0.8 mg by mouth daily., Disp: , Rfl:     Radiology:   No results found.  Cardiac Studies:   Treadmill stress test  [10/09/2014]:  Indication: Atrial  Fibrillation Resting EKG SR with 1st degree AV Block, LAD, LAFB, RBBB. Trifascicular block. Normal BP response. There was no ST-T changes of ischemia with exercise stress test. No stress induced arrhythmias. Stress terminated due to THR (>85% MPHR)/MPHR met. The patient exercised according to Bruce Protocol, Total time recorded 06:50 min achieving max heart rate of 158 which was 106% of MPHR for age and 8.34 METS of work.  Coronary CTA 03/06/2019: 1. Coronary artery calcium score 1837 Agatston units. This places the patient in the 86th percentile for age and gender, suggesting high risk for future cardiac events. 2. Extensive calcified plaque throughout the coronary system. Mild stenosis for the most part, possibly around 50% stenosis in the distal RCA and the proximal LCx (small vessel). However, difficult to quantify given extensive calcification with possibility of blooming artifact. Will send for FFR. 3.  Pulmonary veins without stenosis.  4.  No LA appendage thrombus seen.  FFR 0.9 mid LAD, FFR 0.93 distal RCA, FFR 0.94 mid LCx: No evidence for hemodynamically significant stenosis.  Echocardiogram 05/15/2022:  1. Diastolic function could not be assessed due to atrial fibrillation. Left ventricular ejection fraction, by estimation, is 60 to 65%. The left ventricle has normal function. The left ventricle has no regional wall motion abnormalities. Left  ventricular diastolic parameters are indeterminate.  2. Right ventricular systolic function is normal. The right ventricular size is normal.  3. Left atrial size was moderately dilated.  4. Right atrial size was mildly dilated.  5. The mitral valve is normal in structure. Trivial mitral valve regurgitation. No evidence of mitral stenosis.  6. The aortic valve is normal in structure. Aortic valve regurgitation is not visualized. No aortic stenosis is present.  7. The inferior vena cava is normal in size with greater than 50% respiratory  variability, suggesting right atrial pressure of 3 mmHg.  EKG:  EKG 08/25/2022: Atrial fibrillation with rapid ventricular response at the rate of 111 bpm, normal axis, nonspecific T abnormality.  Compared to 06/02/2022, marked sinus bradycardia at the rate of 46 bpm with first-degree AV block has been replaced.  05/13/2022, atrial fibrillation with RVR   Assessment     ICD-10-CM   1. Paroxysmal atrial fibrillation (HCC)  I48.0 EKG 12-Lead    metoprolol tartrate (LOPRESSOR) 25 MG tablet    2. Stage 3b chronic kidney disease (HCC)  N18.32     3. Essential hypertension  I10      CHA2DS2-VASc Score is 4.  Yearly risk of stroke: 5% (A, HTN, CAD).  Score of 1=0.6; 2=2.2; 3=3.2; 4=4.8; 5=7.2; 6=9.8; 7=>9.8) -(CHF; HTN; vasc disease DM,  Male = 1; Age <65 =0; 65-74 = 1,  >75 =2; stroke/embolism= 2).    Medications Discontinued During This Encounter  Medication Reason   metoprolol tartrate (LOPRESSOR) 25 MG tablet Reorder   dofetilide (TIKOSYN) 250 MCG capsule Ineffective      Meds ordered this encounter  Medications   metoprolol tartrate (LOPRESSOR) 25 MG tablet    Sig: Take 1 tablet (25 mg total) by mouth 2 (two) times daily. TAKE 1 TABLET(25 MG) BY MOUTH TWICE DAILY Strength: 25 mg    Dispense:  60 tablet    Refill:  2      Recommendations:   Gregory [name]  is a 82 y.o. Caucasian male with  history of stage 3b CKD, hypertension, mild hyperlipidemia and moderate CAD by coronary CTA, paroxysmal atrial fibrillation. Atrial fibrillation ablation on 11/28/2016 and second ablation on 02/22/2019, multiple  cardioversions and failed Multaq, amiodarone and previously Tikosyn, but wanted to try Tikosyn again and hence patient was admitted for Tikosyn administration on 05/14/2022, underwent direct-current cardioversion successfully to sinus rhythm, discharged home 2 days later.    1. Paroxysmal atrial fibrillation ([place]) Patient close recommend office visit, unfortunately he has failed  Tikosyn as well as patient states that he is in persistent atrial fibrillation 7 to 8 days out of 10 days.  At this point I have decided to do rate control only, will discontinue Tikosyn, presently he is on metoprolol tartrate 12.5 mg daily which I will increase to 25 mg p.o. twice daily, patient has underlying sinus bradycardia with first-degree AV block and sinus node dysfunction/AV nodal disease need to be excluded as he is 82 years of age.  Will continue to monitor his heart rate closely.  If he remains asymptomatic with rate control, will continue medical management only.  The question whether he has shortness of breath with A-fib is related to A-fib with RVR versus atrial fibrillation needs to be considered yet and only time will tell.  2. Stage 3b chronic kidney disease ([place]) Patient has stage IIIb chronic kidney disease, he has an appointment pending to see nephrology next month.  He has remained stable with stage IIIb chronic kidney disease so far.  3. Essential hypertension Blood pressure is well-controlled, continue present medications.  I would like to see him back in 6 weeks for follow-up.     Adrian Prows, MD, [facility] 08/25/2022, 10:56 AM Office: [phone] Pager: [phone]

## 2022-08-26 ENCOUNTER — Ambulatory Visit: Payer: Medicare Other | Admitting: Podiatry

## 2022-09-23 ENCOUNTER — Encounter: Payer: Self-pay | Admitting: Podiatry

## 2022-09-23 ENCOUNTER — Ambulatory Visit (INDEPENDENT_AMBULATORY_CARE_PROVIDER_SITE_OTHER): Payer: Medicare Other | Admitting: Podiatry

## 2022-09-23 ENCOUNTER — Ambulatory Visit (INDEPENDENT_AMBULATORY_CARE_PROVIDER_SITE_OTHER): Payer: Medicare Other

## 2022-09-23 DIAGNOSIS — T148XXA Other injury of unspecified body region, initial encounter: Secondary | ICD-10-CM | POA: Diagnosis not present

## 2022-09-23 DIAGNOSIS — M778 Other enthesopathies, not elsewhere classified: Secondary | ICD-10-CM

## 2022-09-23 NOTE — Progress Notes (Signed)
He presents today chief complaint of pain to the fifth metatarsal of his left foot.  States that this is going on for a while and has subsided and recently stepped on the threshold of the bathroom door and caused the pain to become more intense.  Objective: Vital signs daily oriented x 3 there is no erythema edema cellulitis drainage odor there is some tenderness on palpation of the fifth metatarsal and fifth metatarsal base left.  Radiographs: Radiographs left foot taken today do not demonstrate any acute finding.  There does appear to be a density in the midshaft and proximal area of the fifth metatarsal indicating possible old fracture or possible increase in bone density associated with bony edema.  No acute fractures identified.  Assessment: Contusion fifth metatarsal left foot.  Plan: Continue to wear stiff soled shoes offered him a boot he declines.  Follow-up with him as needed

## 2022-10-13 ENCOUNTER — Ambulatory Visit: Payer: Medicare Other | Admitting: Cardiology

## 2022-10-13 ENCOUNTER — Encounter: Payer: Self-pay | Admitting: Cardiology

## 2022-10-13 VITALS — BP 117/81 | HR 86 | Resp 16 | Ht 70.0 in | Wt 185.4 lb

## 2022-10-13 DIAGNOSIS — I10 Essential (primary) hypertension: Secondary | ICD-10-CM

## 2022-10-13 DIAGNOSIS — I4819 Other persistent atrial fibrillation: Secondary | ICD-10-CM

## 2022-10-13 DIAGNOSIS — Z79899 Other long term (current) drug therapy: Secondary | ICD-10-CM

## 2022-10-13 MED ORDER — APIXABAN 2.5 MG PO TABS: 2.5000 mg | ORAL_TABLET | Freq: Two times a day (BID) | ORAL | 3 refills | Status: DC

## 2022-10-13 NOTE — Progress Notes (Signed)
Primary Physician/Referring:  Mattie Marlin, DO  Patient ID: Gregory [name], male    DOB: [birth year], 82 y.o.   MRN: [id number]  Chief Complaint  Patient presents with   Paroxysmal atrial fibrillation    Follow-up    6 weeks    HPI:    Gregory [name]  is a 82 y.o. Caucasian male with  history of stage 3b CKD, hypertension, mild hyperlipidemia and moderate CAD by coronary CTA, paroxysmal atrial fibrillation. Atrial fibrillation ablation on 11/28/2016 and second ablation on 02/22/2019, multiple cardioversions and failed Multaq, amiodarone and Tikosyn.  Cardioversion in December 2023 on Tikosyn.   In view of mild dyspnea, and feeling palpitations after multiple antiarrhythmic therapy failure, we decided to try rate control only, previously could not tolerate beta-blocker therapy at high dose due to bradycardia.  On his last office visit I increased his metoprolol from 12.5 mg to 25 mg twice daily which he states that he is tolerating.  States that with regard to atrial fibrillation, he has not noticed any decreased exercise tolerance and also states that he does not feel palpitations anymore.  Just returned back from a trip to [place] and did hiking and had no problems doing this.  Denies dyspnea, PND or orthopnea or leg edema.  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 01/12/2020   Arthritis    "right knee" (01/28/2016)   Atypical atrial flutter (HCC) 05/17/2019   Closed right hip fracture (HCC) 12/28/2021   Diverticulosis of colon (without mention of hemorrhage)    Hyperlipidemia    Hypertension    Internal hemorrhoids without mention of complication    Personal history of colonic polyps 01/17/2002   adenomatous, tublar adenoma 02/10/2007   PNA (pneumonia) 03/31/2012   Social History   Tobacco Use   Smoking status: Never   Smokeless tobacco: Never  Substance Use Topics   Alcohol use: Yes    Alcohol/week: 1.0 standard  drink of alcohol    Types: 1 Glasses of wine per week    Comment: daily   Marital Status: Married  ROS  Review of Systems  Cardiovascular:  Positive for dyspnea on exertion. Negative for chest pain and leg swelling.   Objective  Blood pressure 117/81, pulse 86, resp. rate 16, height 5\' 10"  (1.778 m), weight 185 lb 6.4 oz (84.1 kg), SpO2 97 %.     10/13/2022   10:42 AM 08/25/2022   10:01 AM 06/02/2022   10:48 AM  Vitals with BMI  Height 5\' 10"  5\' 10"  5\' 10"   Weight 185 lbs 6 oz 186 lbs 187 lbs  BMI 26.6 26.69 26.83  Systolic 117 103 782  Diastolic 81 72 73  Pulse 86 66 54     Physical Exam Constitutional:      Appearance: He is well-developed.  Neck:     Vascular: No carotid bruit or JVD.  Cardiovascular:     Rate and Rhythm: Normal rate. Rhythm irregular.     Pulses: Intact distal pulses.     Heart sounds: No murmur heard.    No gallop.  Pulmonary:     Effort: Pulmonary effort is normal. No accessory muscle usage or respiratory distress.     Breath sounds: Normal breath sounds.  Abdominal:     General: Bowel sounds are normal.     Palpations: Abdomen is soft.  Musculoskeletal:     Right lower leg: No edema.  Left lower leg: No edema.    Laboratory examination:   Recent Labs    05/14/22 1726 05/15/22 0356 05/16/22 0247  NA 138 141 139  K 4.3 4.1 4.0  CL 108 107 109  CO2 22 22 22   GLUCOSE 96 95 118*  BUN 29* 30* 34*  CREATININE 1.71* 1.73* 1.76*  CALCIUM 9.0 8.9 8.8*  GFRNONAA 40* 39* 39*      Latest Ref Rng & Units 05/16/2022    2:47 AM 05/15/2022    3:56 AM 05/14/2022    5:26 PM  CMP  Glucose 70 - 99 mg/dL 161  95  96   BUN 8 - 23 mg/dL 34  30  29   Creatinine 0.61 - 1.24 mg/dL 0.96  0.45  4.09   Sodium 135 - 145 mmol/L 139  141  138   Potassium 3.5 - 5.1 mmol/L 4.0  4.1  4.3   Chloride 98 - 111 mmol/L 109  107  108   CO2 22 - 32 mmol/L 22  22  22    Calcium 8.9 - 10.3 mg/dL 8.8  8.9  9.0       Latest Ref Rng & Units 05/12/2022    8:37 AM  02/25/2022    8:05 AM 12/30/2021    1:49 AM  CBC  WBC 3.4 - 10.8 x10E3/uL 4.6  5.1  6.8   Hemoglobin 13.0 - 17.7 g/dL 81.1  91.4  78.2   Hematocrit 37.5 - 51.0 % 42.4  40.4  29.8   Platelets 150 - 450 x10E3/uL 211  188  118    Lipid Panel     Component Value Date/Time   CHOL 134 07/10/2020 0817   TRIG 56 07/10/2020 0817   HDL 50 07/10/2020 0817   LDLCALC 72 07/10/2020 0817   HEMOGLOBIN A1C No results found for: "HGBA1C", "MPG" TSH 06/18/2018: TSH 1.69.   External labs:   Labs 12/23/2021:  Total cholesterol 127, triglycerides 65, HDL 53, LDL 60.  Medications and allergies  No Known Allergies   Current Outpatient Medications:    acetaminophen (TYLENOL) 500 MG tablet, Take 500-1,000 mg by mouth 2 (two) times daily as needed for mild pain, moderate pain or headache., Disp: , Rfl:    finasteride (PROSCAR) 5 MG tablet, Take 5 mg by mouth daily., Disp: , Rfl:    lisinopril (ZESTRIL) 5 MG tablet, TAKE 1 TABLET(5 MG) BY MOUTH DAILY, Disp: 90 tablet, Rfl: 1   metoprolol tartrate (LOPRESSOR) 25 MG tablet, Take 1 tablet (25 mg total) by mouth 2 (two) times daily. TAKE 1 TABLET(25 MG) BY MOUTH TWICE DAILY Strength: 25 mg, Disp: 60 tablet, Rfl: 2   MIRALAX 17 GM/SCOOP powder, Take 17 g by mouth daily as needed for mild constipation., Disp: , Rfl:    Multiple Vitamins-Minerals (MULTIVITAMIN ADULTS 50+) TABS, Take 1 tablet by mouth 2 (two) times a week., Disp: , Rfl:    MYRBETRIQ 50 MG TB24 tablet, Take 50 mg by mouth daily., Disp: , Rfl:    psyllium (METAMUCIL) 58.6 % packet, Take 1 packet by mouth 3 (three) times a week., Disp: , Rfl:    rosuvastatin (CRESTOR) 5 MG tablet, TAKE 1 TABLET(5 MG) BY MOUTH DAILY (Patient taking differently: Take 5 mg by mouth at bedtime.), Disp: 90 tablet, Rfl: 3   tamsulosin (FLOMAX) 0.4 MG CAPS capsule, Take 0.8 mg by mouth daily., Disp: , Rfl:    apixaban (ELIQUIS) 2.5 MG TABS tablet, Take 1 tablet (2.5 mg total) by mouth 2 (  two) times daily., Disp: 180 tablet,  Rfl: 3    Radiology:   No results found.  Cardiac Studies:   Treadmill stress test  [10/09/2014]: Indication: Atrial Fibrillation Resting EKG SR with 1st degree AV Block, LAD, LAFB, RBBB. Trifascicular block. Normal BP response. There was no ST-T changes of ischemia with exercise stress test. No stress induced arrhythmias. Stress terminated due to THR (>85% MPHR)/MPHR met. The patient exercised according to Bruce Protocol, Total time recorded 06:50 min achieving max heart rate of 158 which was 106% of MPHR for age and 8.34 METS of work.  Coronary CTA 03/06/2019: 1. Coronary artery calcium score 1837 Agatston units. This places the patient in the 86th percentile for age and gender, suggesting high risk for future cardiac events. 2. Extensive calcified plaque throughout the coronary system. Mild stenosis for the most part, possibly around 50% stenosis in the distal RCA and the proximal LCx (small vessel). However, difficult to quantify given extensive calcification with possibility of blooming artifact. Will send for FFR. 3.  Pulmonary veins without stenosis.  4.  No LA appendage thrombus seen.  FFR 0.9 mid LAD, FFR 0.93 distal RCA, FFR 0.94 mid LCx: No evidence for hemodynamically significant stenosis.  Echocardiogram 05/15/2022:  1. Diastolic function could not be assessed due to atrial fibrillation. Left ventricular ejection fraction, by estimation, is 60 to 65%. The left ventricle has normal function. The left ventricle has no regional wall motion abnormalities. Left ventricular diastolic parameters are indeterminate.  2. Right ventricular systolic function is normal. The right ventricular size is normal.  3. Left atrial size was moderately dilated.  4. Right atrial size was mildly dilated.  5. The mitral valve is normal in structure. Trivial mitral valve regurgitation. No evidence of mitral stenosis.  6. The aortic valve is normal in structure. Aortic valve regurgitation is not visualized.  No aortic stenosis is present.  7. The inferior vena cava is normal in size with greater than 50% respiratory variability, suggesting right atrial pressure of 3 mmHg.  EKG:  EKG 10/13/2022: Atrial fibrillation with controlled ventricular response at the rate of 84 bpm, poor R progression, low-voltage complexes.  No significant change compared to 08/25/2022, heart rate is much improved from 111 bpm.  Assessment     ICD-10-CM   1. Persistent atrial fibrillation (HCC)  I48.19 EKG 12-Lead    apixaban (ELIQUIS) 2.5 MG TABS tablet    PCV ECHOCARDIOGRAM COMPLETE    CANCELED: PCV ECHOCARDIOGRAM COMPLETE    2. Essential hypertension  I10     3. High risk medication use  Z79.899      CHA2DS2-VASc Score is 4.  Yearly risk of stroke: 5% (A, HTN, CAD).  Score of 1=0.6; 2=2.2; 3=3.2; 4=4.8; 5=7.2; 6=9.8; 7=>9.8) -(CHF; HTN; vasc disease DM,  Male = 1; Age <65 =0; 65-74 = 1,  >75 =2; stroke/embolism= 2).    Medications Discontinued During This Encounter  Medication Reason   apixaban (ELIQUIS) 2.5 MG TABS tablet Reorder   Meds ordered this encounter  Medications   apixaban (ELIQUIS) 2.5 MG TABS tablet    Sig: Take 1 tablet (2.5 mg total) by mouth 2 (two) times daily.    Dispense:  180 tablet    Refill:  3     Recommendations:   Gregory [name]  is a 82 y.o. Caucasian male with  history of stage 3b CKD, hypertension, mild hyperlipidemia and moderate CAD by coronary CTA, paroxysmal atrial fibrillation. Atrial fibrillation ablation on 11/28/2016 and second ablation on  02/22/2019, multiple cardioversions and failed Multaq, amiodarone and Tikosyn.  Cardioversion in December 2023 on Tikosyn.   1. Persistent atrial fibrillation [facility]) Patient has persistent atrial fibrillation.  Suspect this will be permanent atrial fibrillation.  He is now not on any antiarrhythmic therapy.  Heart rate is much improved and controlled since increasing the dose of the metoprolol to 50 mg twice daily, while he was in  sinus rhythm could not tolerate high-dose due to marked bradycardia. I would like to repeat echocardiogram in 6 months to exclude any atrial fibrillation related cardiomyopathy.  Presently asymptomatic and just returned from a trip to [place] and was able to hike without difficulty.  - EKG 12-Lead - apixaban (ELIQUIS) 2.5 MG TABS tablet; Take 1 tablet (2.5 mg total) by mouth 2 (two) times daily.  Dispense: 180 tablet; Refill: 3 - PCV ECHOCARDIOGRAM COMPLETE; Future  2. Essential hypertension Blood pressure is well-controlled.  He does have stage IIIa-b disease which has remained stable, he was evaluated by nephrology, recommended no change in therapy as renal function has remained stable over the years.  3. High risk medication use Patient is presently on Eliquis, I will refill his prescription.  Office visit in 6 months with follow-up echocardiogram.    Gregory Decamp, MD, [facility] 10/13/2022, 11:04 AM Office: [phone] Pager: [phone]

## 2022-10-16 ENCOUNTER — Encounter: Payer: Self-pay | Admitting: Cardiology

## 2022-10-16 NOTE — Telephone Encounter (Signed)
From patient.

## 2022-11-04 ENCOUNTER — Other Ambulatory Visit: Payer: Self-pay | Admitting: Cardiology

## 2022-11-04 DIAGNOSIS — I480 Paroxysmal atrial fibrillation: Secondary | ICD-10-CM

## 2022-11-18 ENCOUNTER — Ambulatory Visit: Payer: Medicare Other

## 2022-11-18 DIAGNOSIS — I4819 Other persistent atrial fibrillation: Secondary | ICD-10-CM

## 2023-01-18 ENCOUNTER — Encounter: Payer: Self-pay | Admitting: Podiatry

## 2023-02-04 ENCOUNTER — Encounter: Payer: Self-pay | Admitting: Cardiology

## 2023-02-05 NOTE — Telephone Encounter (Signed)
From patient.

## 2023-02-13 ENCOUNTER — Other Ambulatory Visit: Payer: Self-pay | Admitting: Cardiology

## 2023-02-13 DIAGNOSIS — N1832 Chronic kidney disease, stage 3b: Secondary | ICD-10-CM

## 2023-02-21 ENCOUNTER — Other Ambulatory Visit: Payer: Self-pay | Admitting: Cardiology

## 2023-02-21 DIAGNOSIS — I2510 Atherosclerotic heart disease of native coronary artery without angina pectoris: Secondary | ICD-10-CM

## 2023-02-21 DIAGNOSIS — E7800 Pure hypercholesterolemia, unspecified: Secondary | ICD-10-CM

## 2023-03-15 ENCOUNTER — Other Ambulatory Visit: Payer: Self-pay | Admitting: Cardiology

## 2023-03-15 DIAGNOSIS — I480 Paroxysmal atrial fibrillation: Secondary | ICD-10-CM

## 2023-04-14 ENCOUNTER — Ambulatory Visit: Payer: Self-pay | Admitting: Cardiology

## 2023-05-07 ENCOUNTER — Encounter: Payer: Self-pay | Admitting: Podiatry

## 2023-05-07 ENCOUNTER — Ambulatory Visit (INDEPENDENT_AMBULATORY_CARE_PROVIDER_SITE_OTHER): Payer: Medicare Other | Admitting: Podiatry

## 2023-05-07 ENCOUNTER — Ambulatory Visit (INDEPENDENT_AMBULATORY_CARE_PROVIDER_SITE_OTHER): Payer: Medicare Other

## 2023-05-07 DIAGNOSIS — M722 Plantar fascial fibromatosis: Secondary | ICD-10-CM

## 2023-05-07 DIAGNOSIS — M19071 Primary osteoarthritis, right ankle and foot: Secondary | ICD-10-CM | POA: Diagnosis not present

## 2023-05-07 NOTE — Progress Notes (Signed)
He presents today chief complaint of pain to his right ankle.  States that he is recently had his hip and his knee worked on and his right ankle is just getting a little more stiff as time goes on.  He is wondering if there is anything wrong with it.  States that anti-inflammatories is not allowed to take because of his Eliquis so he really has not tried anything topically for pain relief to limit limiting his symptoms.  Objective: Vital signs stable alert oriented x 3 pulses are palpable.  There is no erythema edema cellulitis drainage or odor.  No open lesions or wounds.  He has a supple well-hydrated tissue his tendons are intact muscles are intact he does have some tenderness on range of motion of the ankle joint at the level of the medial shoulder and the inferior medial malleolar region which feels fluctuant on palpation.  Radiographs taken today demonstrate an osseously mature individual with good bone mineralization however he does have worsening arthritis degenerative in nature to the medial shoulder of the talus.  He does have a talar tilt with neutral position.  Most likely secondary to the loss of the cartilage.  Assessment: Osteoarthritis medial ankle right.  Plan: Discussed etiology pathology conservative versus surgical therapies at this point.  He is going to try topical anti-inflammatory cream and I will follow-up with him on an as-needed basis.  I did offer him an injection which she declined.

## 2023-06-12 ENCOUNTER — Encounter: Payer: Self-pay | Admitting: Cardiology

## 2023-06-12 ENCOUNTER — Ambulatory Visit: Payer: Medicare Other | Attending: Cardiology | Admitting: Cardiology

## 2023-06-12 VITALS — BP 120/80 | HR 69 | Resp 16 | Ht 70.0 in | Wt 192.4 lb

## 2023-06-12 DIAGNOSIS — I4821 Permanent atrial fibrillation: Secondary | ICD-10-CM | POA: Diagnosis present

## 2023-06-12 DIAGNOSIS — N1832 Chronic kidney disease, stage 3b: Secondary | ICD-10-CM | POA: Insufficient documentation

## 2023-06-12 DIAGNOSIS — I10 Essential (primary) hypertension: Secondary | ICD-10-CM | POA: Diagnosis present

## 2023-06-12 NOTE — Patient Instructions (Signed)
 Follow-Up: At [facility], you and your health needs are our priority.  As part of our continuing mission to provide you with exceptional heart care, we have created designated Provider Care Teams.  These Care Teams include your primary Cardiologist (physician) and Advanced Practice Providers (APPs -  Physician Assistants and Nurse Practitioners) who all work together to provide you with the care you need, when you need it.  Your next appointment:   1 year(s)  Provider:   [name], MD

## 2023-06-12 NOTE — Progress Notes (Unsigned)
Cardiology Office Note:  .   Date:  06/13/2023  ID:  Gregory [name], DOB Dec 17, 1940, MRN [id number] PCP: Mattie Marlin, DO  [facility] Providers Cardiologist:  None Electrophysiologist:  Will Jorja Loa, MD   History of Present Illness: .   Gregory [name] is a 82 y.o.   Discussed the use of AI scribe software for clinical note transcription with the patient, who gave verbal consent to proceed.  History of Present Illness   The patient, with a history of atrial fibrillation, chronic kidney disease, and hypertension, reports feeling well overall. He recently returned from a trip to [place] without any health issues. The patient remains active, participating in regular physical activities such as walking and biking. The patient's weight has remained stable, and he reports no issues with breathing. However, he does experience nasal congestion at night when lying flat, which he manages with sinus rinses and nasal sprays. The patient denies any chest pain or heart racing. He reports that his heart rate has been stable, although he does need to pause for breath if he suddenly becomes active after a period of sedentariness. The patient also reports some pain in the lungs when he sneezes or coughs.      Review of Systems  Cardiovascular:  Negative for chest pain, dyspnea on exertion and leg swelling.   Labs   Lab Results  Component Value Date   NA 139 05/16/2022   K 4.0 05/16/2022   CO2 22 05/16/2022   GLUCOSE 118 (H) 05/16/2022   BUN 34 (H) 05/16/2022   CREATININE 1.76 (H) 05/16/2022   CALCIUM 8.8 (L) 05/16/2022   EGFR 35 (L) 05/12/2022   GFRNONAA 39 (L) 05/16/2022      Latest Ref Rng & Units 05/16/2022    2:47 AM 05/15/2022    3:56 AM 05/14/2022    5:26 PM  BMP  Glucose 70 - 99 mg/dL 865  95  96   BUN 8 - 23 mg/dL 34  30  29   Creatinine 0.61 - 1.24 mg/dL 7.84  6.96  2.95   Sodium 135 - 145 mmol/L 139  141  138   Potassium 3.5 - 5.1 mmol/L 4.0  4.1   4.3   Chloride 98 - 111 mmol/L 109  107  108   CO2 22 - 32 mmol/L 22  22  22    Calcium 8.9 - 10.3 mg/dL 8.8  8.9  9.0       Latest Ref Rng & Units 05/12/2022    8:37 AM 02/25/2022    8:05 AM 12/30/2021    1:49 AM  CBC  WBC 3.4 - 10.8 x10E3/uL 4.6  5.1  6.8   Hemoglobin 13.0 - 17.7 g/dL 28.4  13.2  44.0   Hematocrit 37.5 - 51.0 % 42.4  40.4  29.8   Platelets 150 - 450 x10E3/uL 211  188  118    Labs 12/23/2021:  Total cholesterol 127, triglycerides 65, HDL 53, LDL 60.  Physical Exam:   VS:  BP 120/80 (BP Location: Left Arm, Patient Position: Sitting, Cuff Size: Normal)   Pulse 69   Resp 16   Ht 5\' 10"  (1.778 m)   Wt 192 lb 6.4 oz (87.3 kg)   SpO2 95%   BMI 27.61 kg/m    Wt Readings from Last 3 Encounters:  06/12/23 192 lb 6.4 oz (87.3 kg)  10/13/22 185 lb 6.4 oz (84.1 kg)  08/25/22 186 lb (84.4 kg)  Physical Exam Neck:     Vascular: No carotid bruit or JVD.  Cardiovascular:     Rate and Rhythm: Normal rate. Rhythm irregular.     Pulses: Normal pulses and intact distal pulses.     Heart sounds: No murmur heard. Pulmonary:     Effort: Pulmonary effort is normal.     Breath sounds: Normal breath sounds.  Abdominal:     General: Bowel sounds are normal.     Palpations: Abdomen is soft.  Musculoskeletal:     Right lower leg: No edema.     Left lower leg: No edema.  Skin:    Capillary Refill: Capillary refill takes less than 2 seconds.    Studies Reviewed: .    Echocardiogram 11/18/2022: Left ventricle cavity is normal in size. Mild concentric hypertrophy of the left ventricle. Normal global wall motion. Normal LV systolic function with EF 53%. Unable to evaluate diastolic function due to atrial fibrillation. Left atrial cavity is severely dilated. Moderate (Grade II) mitral regurgitation. Mild to moderate tricuspid regurgitation. Estimated pulmonary artery systolic pressure 28 mmHg. Previous study in 2021 reported severe LA dilatation, PASP 34 mmHg.  EKG:     EKG Interpretation Date/Time:  Friday June 12 2023 16:05:33 EST Ventricular Rate:  69 PR Interval:    QRS Duration:  84 QT Interval:  364 QTC Calculation: 390 R Axis:   -11  Text Interpretation: EKG 06/12/2023: Atrial fibrillation with controlled ventricular response at the rate of 69 bpm.  Compared to 05/16/2022, sinus rhythm has been replaced. Reconfirmed by Delrae Rend [phone]) on 06/12/2023 4:11:38 PM    EKG 10/13/2022: Atrial fibrillation with controlled ventricular response at the rate of 84 bpm, poor R progression, low-voltage complexes.  Medications and allergies    No Known Allergies   Current Outpatient Medications:    acetaminophen (TYLENOL) 500 MG tablet, Take 500-1,000 mg by mouth 2 (two) times daily as needed for mild pain, moderate pain or headache., Disp: , Rfl:    apixaban (ELIQUIS) 2.5 MG TABS tablet, Take 1 tablet (2.5 mg total) by mouth 2 (two) times daily., Disp: 180 tablet, Rfl: 3   finasteride (PROSCAR) 5 MG tablet, Take 5 mg by mouth daily., Disp: , Rfl:    lisinopril (ZESTRIL) 5 MG tablet, TAKE 1 TABLET(5 MG) BY MOUTH DAILY, Disp: 90 tablet, Rfl: 1   metoprolol tartrate (LOPRESSOR) 25 MG tablet, TAKE 1 TABLET(25 MG) BY MOUTH TWICE DAILY, Disp: 180 tablet, Rfl: 3   Multiple Vitamins-Minerals (MULTIVITAMIN ADULTS 50+) TABS, Take 1 tablet by mouth 2 (two) times a week., Disp: , Rfl:    MYRBETRIQ 50 MG TB24 tablet, Take 50 mg by mouth daily., Disp: , Rfl:    psyllium (METAMUCIL) 58.6 % packet, Take 1 packet by mouth 3 (three) times a week., Disp: , Rfl:    rosuvastatin (CRESTOR) 5 MG tablet, TAKE 1 TABLET(5 MG) BY MOUTH DAILY, Disp: 90 tablet, Rfl: 3   tamsulosin (FLOMAX) 0.4 MG CAPS capsule, Take 0.8 mg by mouth daily., Disp: , Rfl:    ASSESSMENT AND PLAN: .      ICD-10-CM   1. Permanent atrial fibrillation (HCC)  I48.21 EKG 12-Lead    2. Essential hypertension  I10     3. Stage 3b chronic kidney disease (HCC)  N18.32      Assessment and Plan     Permanent Atrial Fibrillation Persistent, asymptomatic, and rate-controlled. Patient is on Eliquis 2.5mg  twice daily and Metoprolol 25mg  twice daily. Discussed the lack of benefit for  a third ablation given the patient's lack of symptoms and normal heart function. -Continue current medications and management.  Chronic Kidney Disease (Stage 3) Stable, patient is on Lisinopril 5mg . Follow-up with nephrology in a year. -Continue current medications and management. -Is also being followed by nephrology.  Hypertension Well-controlled on medical tartrate 25 mg twice daily and lisinopril 5 mg daily -Continue current medications and management.  Follow-up in 1 year.   Signed,  [name], MD, [facility] 06/13/2023, 6:26 AM [facility] [address] [place], [place] [address] Phone: [phone]. Fax:  [phone]

## 2023-06-15 NOTE — Telephone Encounter (Signed)
No drug interaction noted that is significant. I sent him MyChart Message and he is active on it

## 2023-07-14 ENCOUNTER — Other Ambulatory Visit: Payer: Self-pay | Admitting: Cardiology

## 2023-07-14 DIAGNOSIS — I4819 Other persistent atrial fibrillation: Secondary | ICD-10-CM

## 2023-07-14 NOTE — Telephone Encounter (Signed)
Eliquis 2.5mg  refill request received. Patient is 83 years old, weight-87.3kg, Crea-1.5 on 06/30/23 via care Everywhere from [facility], Diagnosis-Afib, and last seen by Dr. Jacinto Halim on 06/12/23. Dose is appropriate based on dosing criteria. Will send in refill to requested pharmacy.

## 2023-08-12 ENCOUNTER — Other Ambulatory Visit: Payer: Self-pay | Admitting: Cardiology

## 2023-08-12 DIAGNOSIS — N1832 Chronic kidney disease, stage 3b: Secondary | ICD-10-CM

## 2023-10-14 LAB — LAB REPORT - SCANNED
Albumin, Urine POC: 3
Creatinine, POC: 115.3 mg/dL
EGFR: 44
Microalb Creat Ratio: 3

## 2023-11-12 ENCOUNTER — Encounter: Payer: Self-pay | Admitting: Podiatry

## 2023-12-08 ENCOUNTER — Other Ambulatory Visit: Payer: Self-pay | Admitting: Cardiology

## 2023-12-08 ENCOUNTER — Telehealth: Payer: Self-pay | Admitting: Podiatry

## 2023-12-08 DIAGNOSIS — E7800 Pure hypercholesterolemia, unspecified: Secondary | ICD-10-CM

## 2023-12-08 DIAGNOSIS — I2510 Atherosclerotic heart disease of native coronary artery without angina pectoris: Secondary | ICD-10-CM

## 2023-12-08 NOTE — Telephone Encounter (Signed)
 Called and Left message on machine to reschedule his 7/3 @1 :45   appointment due to office closing early for holiday.

## 2023-12-12 ENCOUNTER — Other Ambulatory Visit: Payer: Self-pay | Admitting: Cardiology

## 2023-12-12 DIAGNOSIS — I480 Paroxysmal atrial fibrillation: Secondary | ICD-10-CM

## 2023-12-17 ENCOUNTER — Ambulatory Visit: Admitting: Podiatry

## 2023-12-23 ENCOUNTER — Ambulatory Visit (INDEPENDENT_AMBULATORY_CARE_PROVIDER_SITE_OTHER): Admitting: Podiatry

## 2023-12-23 DIAGNOSIS — M25571 Pain in right ankle and joints of right foot: Secondary | ICD-10-CM

## 2023-12-23 NOTE — Progress Notes (Signed)
 Patient presents today with complaint of pain in the ankle on the right.  Wondering if he can get an injection.  He is going to be going on a trip and wants to see if he can get it calmed down.   Physical exam:  General appearance: Pleasant, and in no acute distress. AOx3.  Vascular: Pedal pulses: DP 2/4 bilaterally, PT 2/4 bilaterally. Mild edema lower legs bilaterally. Capillary fill time immediate B/L.  Neurological: Light touch intact feet bilaterally.  Normal Achilles reflex bilaterally.  No clonus or spasticity noted.   Dermatologic:   Skin normal temperature bilaterally.  Skin normal color, tone, and texture bilaterally.  No redness around ankle noted  Musculoskeletal: Tenderness along the anterior ankle right.  Some tenderness of the range of motion of the ankle joint.  No tenderness in the sinus tarsi or with range of motion of the subtalar joint.    Diagnosis: 1.  Arthralgia ankle joint right  Plan: -Will try an injection and see if this will relieve some of the pain. -injected 3cc 2:1 mixture 0.5 cc Marcaine :Kenolog 10mg /21ml at ankle joint right.    Return 2 weeks follow-up injection

## 2024-01-14 ENCOUNTER — Ambulatory Visit (INDEPENDENT_AMBULATORY_CARE_PROVIDER_SITE_OTHER): Admitting: Podiatry

## 2024-01-14 ENCOUNTER — Encounter: Payer: Self-pay | Admitting: Podiatry

## 2024-01-14 DIAGNOSIS — M19071 Primary osteoarthritis, right ankle and foot: Secondary | ICD-10-CM | POA: Diagnosis not present

## 2024-01-14 NOTE — Progress Notes (Signed)
 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  He states that right now he is really doing well not having a lot of problems not sure if I even need to come to this appointment today but I just wanted to make sure that I was doing okay.  Objective: Vital signs are stable he is alert oriented x 3 he has no pain on end range of motion of the subtalar joint though he does have some mild tenderness on palpation of the sinus tarsi right foot.  Assessment: Resolving osteoarthritic symptoms of the subtalar joint right foot.  Plan: Follow-up with me on an as-needed basis.

## 2024-02-29 ENCOUNTER — Encounter: Payer: Self-pay | Admitting: Cardiology

## 2024-04-19 ENCOUNTER — Other Ambulatory Visit: Payer: Self-pay | Admitting: Cardiology

## 2024-04-19 DIAGNOSIS — I4819 Other persistent atrial fibrillation: Secondary | ICD-10-CM

## 2024-04-19 NOTE — Telephone Encounter (Signed)
 Prescription refill request for Eliquis  received. Indication:afib Last office visit:12/24 Scr:1.5  1/25 Age: 83 Weight:87.3  kg  Prescription refilled

## 2024-07-05 ENCOUNTER — Encounter: Payer: Self-pay | Admitting: Podiatry

## 2024-07-05 ENCOUNTER — Other Ambulatory Visit: Payer: Self-pay

## 2024-07-05 MED ORDER — METHYLPREDNISOLONE 4 MG PO TBPK: ORAL_TABLET | ORAL | 0 refills | Status: AC

## 2024-07-11 ENCOUNTER — Encounter: Payer: Self-pay | Admitting: Cardiology

## 2024-07-12 ENCOUNTER — Telehealth: Payer: Self-pay | Admitting: Cardiology

## 2024-07-12 NOTE — Telephone Encounter (Signed)
 Pt states is going out of the country and wants to speak with nurse regarding medications he should take with him, pease advise.

## 2024-07-12 NOTE — Telephone Encounter (Signed)
 Returned patient phone call regarding medication that should be taken with him out of the country. Advised patient to take all of his cardiac medications and prescriptions. Pt going to [place] and [place] for a trip, states highest altitude is 3,000 feet.   Went to [place] before and given prednisone. Given Prednisone by PCP before and not sure if he needs to take it, if no problem with high altitudes. Also, was given steroid shots in ankle and knee. Pt advised to see PCP for all of these needs.   Will send to Dr Ladona for any recommendations.

## 2024-07-13 ENCOUNTER — Ambulatory Visit

## 2024-07-13 ENCOUNTER — Ambulatory Visit (INDEPENDENT_AMBULATORY_CARE_PROVIDER_SITE_OTHER)

## 2024-07-13 DIAGNOSIS — M778 Other enthesopathies, not elsewhere classified: Secondary | ICD-10-CM

## 2024-07-13 DIAGNOSIS — G8929 Other chronic pain: Secondary | ICD-10-CM | POA: Diagnosis not present

## 2024-07-13 DIAGNOSIS — M25572 Pain in left ankle and joints of left foot: Secondary | ICD-10-CM

## 2024-07-13 DIAGNOSIS — M19071 Primary osteoarthritis, right ankle and foot: Secondary | ICD-10-CM

## 2024-07-13 MED ORDER — TRIAMCINOLONE ACETONIDE 10 MG/ML IJ SUSP
5.0000 mg | Freq: Once | INTRAMUSCULAR | Status: AC
  Administered 2024-07-13: 5 mg via INTRAMUSCULAR

## 2024-07-13 NOTE — Progress Notes (Signed)
 "  Subjective:  Patient ID: Gregory [name], male    DOB: 04-22-1941,  MRN: [id number]  Chief Complaint  Patient presents with   Arthritis    needs injection    Discussed the use of AI scribe software for clinical note transcription with the patient, who gave verbal consent to proceed.  History of Present Illness Gregory [name] is an 84 year old male with chronic right ankle pain and prior tendon surgery who presents for management of right ankle pain related to advanced osteoarthritis.  He has longstanding right ankle pain, initially treated conservatively for presumed plantar fasciitis, withperoneal tendon irritation and rupture requiring excision of a nonfunctional tendon and repair of another tendon. He attributes his chronic ankle symptoms to a severe sprain sustained in the [employer].  A couple of years ago his right ankle pain worsened and he was told he had ankle arthritis. A steroid injection in the right ankle before a trip provided several months of relief and allowed him to complete that trip. He still has right ankle pain and is worried it will limit an upcoming trip. Padded, supportive footwear improves his symptoms.  He has an unused prednisone dose pack from his primary care provider, previously prescribed during a trip for foot and knee pain, but he is not taking prednisone currently. He has no other acute concerns.     Review of Systems: Negative except as noted in the HPI. Denies N/V/F/Ch.  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 Well nourished. Oriented to person, place, and time.  Vascular Dorsalis pedis pulses palpable bilaterally. Posterior tibial pulses palpable bilaterally. Capillary refill normal to all digits.  No cyanosis or clubbing noted. Pedal hair growth normal.  Neurologic Normal speech. Epicritic sensation to light touch grossly present bilaterally. Negative tinel sign at tarsal tunnel bilaterally.   Dermatologic Skin texture and turgor are within normal limits.  No open wounds. No skin lesions.  Musculoskeletal: Range of motion is restricted and decreased to the Right ankle with pain without crepitus.  Ankle DF -10 with knee extended and able to get past neutral with knee flexed, indicating Gastrocnemius Equinus. Muscle strength is graded at 5/5 for all dorsiflexors, plantar flexors, invertors bilaterally. Mild weakness to right evertors. There is pain with peri-articular palpation of the Left ankle, most focused medial and anterior. There is minimal pain with palpation about the subtalar and talonavicular joints. Mild cavus footshapepresent.  There is varus change in the ankle that is reducible.   Radiographs: 2 views of the foot and 3 views of the RIGHT ankle were taken and reviewed. Degenerative changes are present to the ankle joint with subchondral sclerosis, peri-articular osteophyte formation, and incongruent varus with medial joint space narrowing. Mild degenerative changes noted at TNJ and STJ.    Mild cavus foot shape.  No acute osseous findings such as fracture or dislocation identified.     Assessment:   1. Arthritis of  right ankle   2. Chronic pain of left ankle   3. Capsulitis of foot, left   4. Arthritis of midtarsal joint of right foot      Plan:  Patient was evaluated and treated and all questions answered.  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 Prior tendon surgery and corticosteroid injections provided temporary relief. Supportive footwear offers some benefit. Oral prednisone reserved for worsening symptoms. Surgical intervention considered for persistent pain. - Administered right ankle corticosteroid injection for symptomatic relief and mobility facilitation. See procedure note below.  - Discussed repeat corticosteroid injections up to every four months if effective. - Discussed surgical options for refractory pain, including postoperative non-weightbearing and rehabilitation. - Reviewed use of oral prednisone dose pack as backup for worsening symptoms. - Encouraged use of supportive, padded footwear for comfort and function. - Provided anticipatory guidance on symptom monitoring and advised return for evaluation as needed.  Procedure: Injection medium joint Location: Right ankle at medial gutter approach Skin Prep: alcohol Injectate: 0.5 cc 0.5% marcaine  plain, 0.5 cc of 1% Lidocaine , 0.5 cc kenalog  10. Disposition: Patient tolerated procedure well. Injection site dressed with a band-aid.    Prentice Ovens, DPM AACFAS Fellowship Trained Podiatric Surgeon [facility]     [1]  Current Outpatient Medications:    acetaminophen  (TYLENOL ) 500 MG tablet, Take 500-1,000 mg by mouth 2 (two) times daily as needed for mild pain, moderate pain or headache., Disp: , Rfl:    ELIQUIS  2.5 MG TABS tablet, TAKE 1 TABLET(2.5 MG) BY MOUTH TWICE DAILY, Disp: 180 tablet, Rfl: 2   finasteride  (PROSCAR ) 5 MG tablet, Take 5 mg by mouth daily., Disp: , Rfl:    lisinopril  (ZESTRIL ) 5 MG tablet, TAKE 1 TABLET(5 MG) BY MOUTH DAILY, Disp: 90 tablet, Rfl: 3   methylPREDNISolone  (MEDROL  DOSEPAK) 4 MG TBPK tablet, Take 6 tabs the 1st day, take 5 tabs the 2nd day, take 4 tabs the 3rd day, take 3 tabs the 4th day,  take 2 tabs the 5th day, and take 1 tab the 6th day, Disp: 21 tablet, Rfl: 0   metoprolol  tartrate (LOPRESSOR ) 25 MG tablet, TAKE 1 TABLET(25 MG) BY MOUTH TWICE DAILY, Disp: 180 tablet, Rfl: 3   Multiple Vitamins-Minerals (MULTIVITAMIN ADULTS 50+) TABS, Take 1 tablet by mouth 2 (two) times a week., Disp: , Rfl:    psyllium (METAMUCIL) 58.6 % packet, Take 1 packet by mouth 3 (three) times a week., Disp: , Rfl:    rosuvastatin  (CRESTOR ) 5 MG tablet, TAKE 1 TABLET(5 MG) BY MOUTH DAILY, Disp: 90 tablet, Rfl: 3   tamsulosin  (FLOMAX ) 0.4 MG CAPS capsule, Take 0.8 mg by mouth daily., Disp: , Rfl:    MYRBETRIQ  50 MG TB24 tablet, Take 50 mg by mouth daily., Disp: , Rfl:  [2]  Social History Tobacco Use  Smoking Status Never  Smokeless Tobacco Never  [3] No Known Allergies  "

## 2024-09-19 ENCOUNTER — Ambulatory Visit: Admitting: Cardiology
